# Patient Record
Sex: Female | Born: 1940
Health system: Southern US, Community
[De-identification: ages and names within clinical notes are randomized; demographics above are authoritative.]

## PROBLEM LIST (undated history)

## (undated) DIAGNOSIS — I82409 Acute embolism and thrombosis of unspecified deep veins of unspecified lower extremity: Secondary | ICD-10-CM

## (undated) DIAGNOSIS — E785 Hyperlipidemia, unspecified: Secondary | ICD-10-CM

## (undated) DIAGNOSIS — K219 Gastro-esophageal reflux disease without esophagitis: Secondary | ICD-10-CM

## (undated) DIAGNOSIS — J189 Pneumonia, unspecified organism: Secondary | ICD-10-CM

## (undated) DIAGNOSIS — F423 Hoarding disorder: Secondary | ICD-10-CM

## (undated) DIAGNOSIS — E119 Type 2 diabetes mellitus without complications: Secondary | ICD-10-CM

## (undated) DIAGNOSIS — M810 Age-related osteoporosis without current pathological fracture: Secondary | ICD-10-CM

## (undated) DIAGNOSIS — M109 Gout, unspecified: Secondary | ICD-10-CM

## (undated) DIAGNOSIS — I129 Hypertensive chronic kidney disease with stage 1 through stage 4 chronic kidney disease, or unspecified chronic kidney disease: Secondary | ICD-10-CM

## (undated) DIAGNOSIS — I10 Essential (primary) hypertension: Secondary | ICD-10-CM

## (undated) DIAGNOSIS — Z91199 Patient's noncompliance with other medical treatment and regimen due to unspecified reason: Secondary | ICD-10-CM

## (undated) DIAGNOSIS — E1122 Type 2 diabetes mellitus with diabetic chronic kidney disease: Secondary | ICD-10-CM

## (undated) DIAGNOSIS — S92901A Unspecified fracture of right foot, initial encounter for closed fracture: Secondary | ICD-10-CM

## (undated) DIAGNOSIS — M1990 Unspecified osteoarthritis, unspecified site: Secondary | ICD-10-CM

## (undated) DIAGNOSIS — F0390 Unspecified dementia without behavioral disturbance: Secondary | ICD-10-CM

## (undated) DIAGNOSIS — G3184 Mild cognitive impairment, so stated: Secondary | ICD-10-CM

## (undated) DIAGNOSIS — G43909 Migraine, unspecified, not intractable, without status migrainosus: Secondary | ICD-10-CM

## (undated) HISTORY — PX: TONSILLECTOMY: SUR1361

## (undated) HISTORY — PX: BILATERAL KNEE ARTHROSCOPY: SUR91

## (undated) HISTORY — PX: CHOLECYSTECTOMY: SHX55

## (undated) HISTORY — PX: BACK SURGERY: SHX140

## (undated) HISTORY — PX: JOINT REPLACEMENT: SHX530

## (undated) HISTORY — PX: NOSE SURGERY: SHX723

## (undated) HISTORY — PX: OTHER SURGICAL HISTORY: SHX169

---

## 2008-04-27 ENCOUNTER — Ambulatory Visit: Payer: Self-pay | Admitting: Podiatry

## 2008-06-17 ENCOUNTER — Ambulatory Visit: Payer: Self-pay | Admitting: Podiatry

## 2009-05-04 ENCOUNTER — Ambulatory Visit: Payer: Self-pay | Admitting: Podiatry

## 2009-10-04 ENCOUNTER — Ambulatory Visit: Payer: Self-pay | Admitting: Surgery

## 2009-12-22 ENCOUNTER — Ambulatory Visit: Payer: Self-pay | Admitting: Podiatry

## 2010-02-06 ENCOUNTER — Other Ambulatory Visit: Payer: Self-pay | Admitting: Sports Medicine

## 2010-04-25 ENCOUNTER — Other Ambulatory Visit: Payer: Self-pay | Admitting: Rheumatology

## 2010-06-18 ENCOUNTER — Ambulatory Visit: Payer: Self-pay | Admitting: Anesthesiology

## 2010-06-19 ENCOUNTER — Ambulatory Visit: Payer: Self-pay | Admitting: Orthopedic Surgery

## 2011-01-30 ENCOUNTER — Ambulatory Visit: Payer: Self-pay | Admitting: Orthopedic Surgery

## 2011-03-11 ENCOUNTER — Ambulatory Visit: Payer: Self-pay | Admitting: Internal Medicine

## 2011-03-12 ENCOUNTER — Ambulatory Visit: Payer: Self-pay | Admitting: Orthopedic Surgery

## 2011-04-04 ENCOUNTER — Ambulatory Visit: Payer: Self-pay | Admitting: Orthopedic Surgery

## 2011-05-09 ENCOUNTER — Encounter: Payer: Self-pay | Admitting: Orthopedic Surgery

## 2011-05-28 ENCOUNTER — Encounter: Payer: Self-pay | Admitting: Orthopedic Surgery

## 2011-06-28 ENCOUNTER — Encounter: Payer: Self-pay | Admitting: Orthopedic Surgery

## 2011-07-04 ENCOUNTER — Ambulatory Visit: Payer: Self-pay | Admitting: Orthopedic Surgery

## 2011-07-26 ENCOUNTER — Encounter: Payer: Self-pay | Admitting: Orthopedic Surgery

## 2011-08-26 ENCOUNTER — Encounter: Payer: Self-pay | Admitting: Orthopedic Surgery

## 2011-09-25 ENCOUNTER — Encounter: Payer: Self-pay | Admitting: Orthopedic Surgery

## 2011-11-08 ENCOUNTER — Ambulatory Visit: Payer: Self-pay | Admitting: Orthopedic Surgery

## 2011-12-02 ENCOUNTER — Ambulatory Visit: Payer: Self-pay | Admitting: Orthopedic Surgery

## 2011-12-02 LAB — CBC WITH DIFFERENTIAL/PLATELET
Basophil #: 0.1 10*3/uL (ref 0.0–0.1)
Eosinophil #: 0.3 10*3/uL (ref 0.0–0.7)
Eosinophil %: 3.4 %
HCT: 39.2 % (ref 35.0–47.0)
HGB: 13.3 g/dL (ref 12.0–16.0)
Lymphocyte #: 2.7 10*3/uL (ref 1.0–3.6)
Lymphocyte %: 28.3 %
MCV: 87 fL (ref 80–100)
Monocyte %: 6.4 %
Neutrophil #: 5.7 10*3/uL (ref 1.4–6.5)
RBC: 4.51 10*6/uL (ref 3.80–5.20)
RDW: 14.7 % — ABNORMAL HIGH (ref 11.5–14.5)
WBC: 9.4 10*3/uL (ref 3.6–11.0)

## 2011-12-02 LAB — PROTIME-INR: Prothrombin Time: 11.8 secs (ref 11.5–14.7)

## 2011-12-02 LAB — BASIC METABOLIC PANEL
Anion Gap: 7 (ref 7–16)
BUN: 30 mg/dL — ABNORMAL HIGH (ref 7–18)
Calcium, Total: 9.1 mg/dL (ref 8.5–10.1)
Co2: 31 mmol/L (ref 21–32)
Creatinine: 1.59 mg/dL — ABNORMAL HIGH (ref 0.60–1.30)
EGFR (African American): 38 — ABNORMAL LOW
EGFR (Non-African Amer.): 33 — ABNORMAL LOW

## 2011-12-02 LAB — MRSA PCR SCREENING

## 2011-12-17 ENCOUNTER — Inpatient Hospital Stay: Payer: Self-pay | Admitting: Orthopedic Surgery

## 2011-12-17 LAB — CREATININE, SERUM
EGFR (African American): 31 — ABNORMAL LOW
EGFR (Non-African Amer.): 27 — ABNORMAL LOW

## 2011-12-18 LAB — BASIC METABOLIC PANEL
Anion Gap: 10 (ref 7–16)
Chloride: 101 mmol/L (ref 98–107)
Co2: 30 mmol/L (ref 21–32)
Creatinine: 1.3 mg/dL (ref 0.60–1.30)
Osmolality: 285 (ref 275–301)

## 2011-12-18 LAB — PLATELET COUNT: Platelet: 198 10*3/uL (ref 150–440)

## 2011-12-18 LAB — HEMOGLOBIN: HGB: 10.6 g/dL — ABNORMAL LOW (ref 12.0–16.0)

## 2011-12-19 LAB — PATHOLOGY REPORT

## 2011-12-19 LAB — HEMOGLOBIN: HGB: 11.4 g/dL — ABNORMAL LOW (ref 12.0–16.0)

## 2011-12-20 ENCOUNTER — Encounter: Payer: Self-pay | Admitting: Internal Medicine

## 2011-12-26 ENCOUNTER — Encounter: Payer: Self-pay | Admitting: Internal Medicine

## 2012-01-26 ENCOUNTER — Encounter: Payer: Self-pay | Admitting: Internal Medicine

## 2013-09-08 IMAGING — CR DG KNEE 1-2V*R*
1 series · 2 of 2 positions shown · non-contrast
Comparison: none

REASON FOR EXAM: right total knee
COMMENTS:   LMP: N/A

PROCEDURE:     DXR - DXR KNEE RIGHT AP AND LATERAL  - December 17, 2011 [DATE]
RESULT:     The patient is status post right knee arthroplasty. Surgical
drains and skin staples are present. No immediate postoperative bone or
hardware complication is evident.

[Series 1: lat · 0.17mm/px · 2 of 2 slices shown]
[im 1/2]
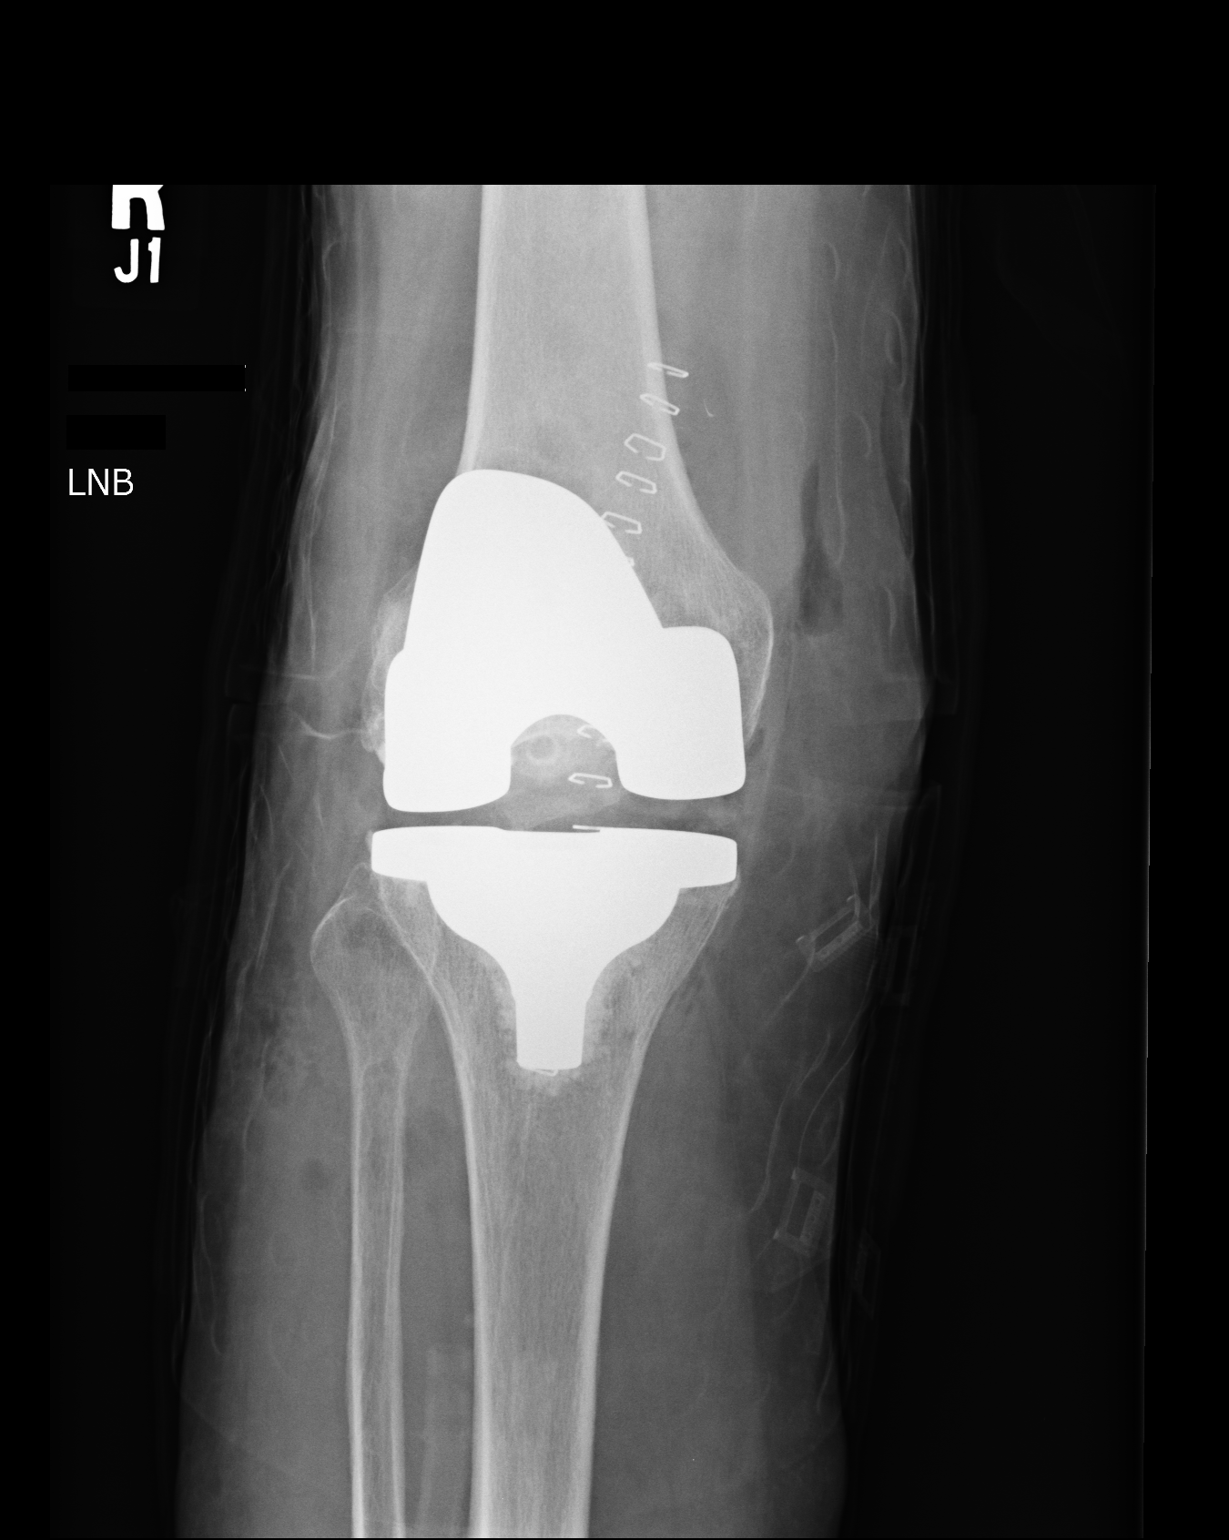
[im 2/2]
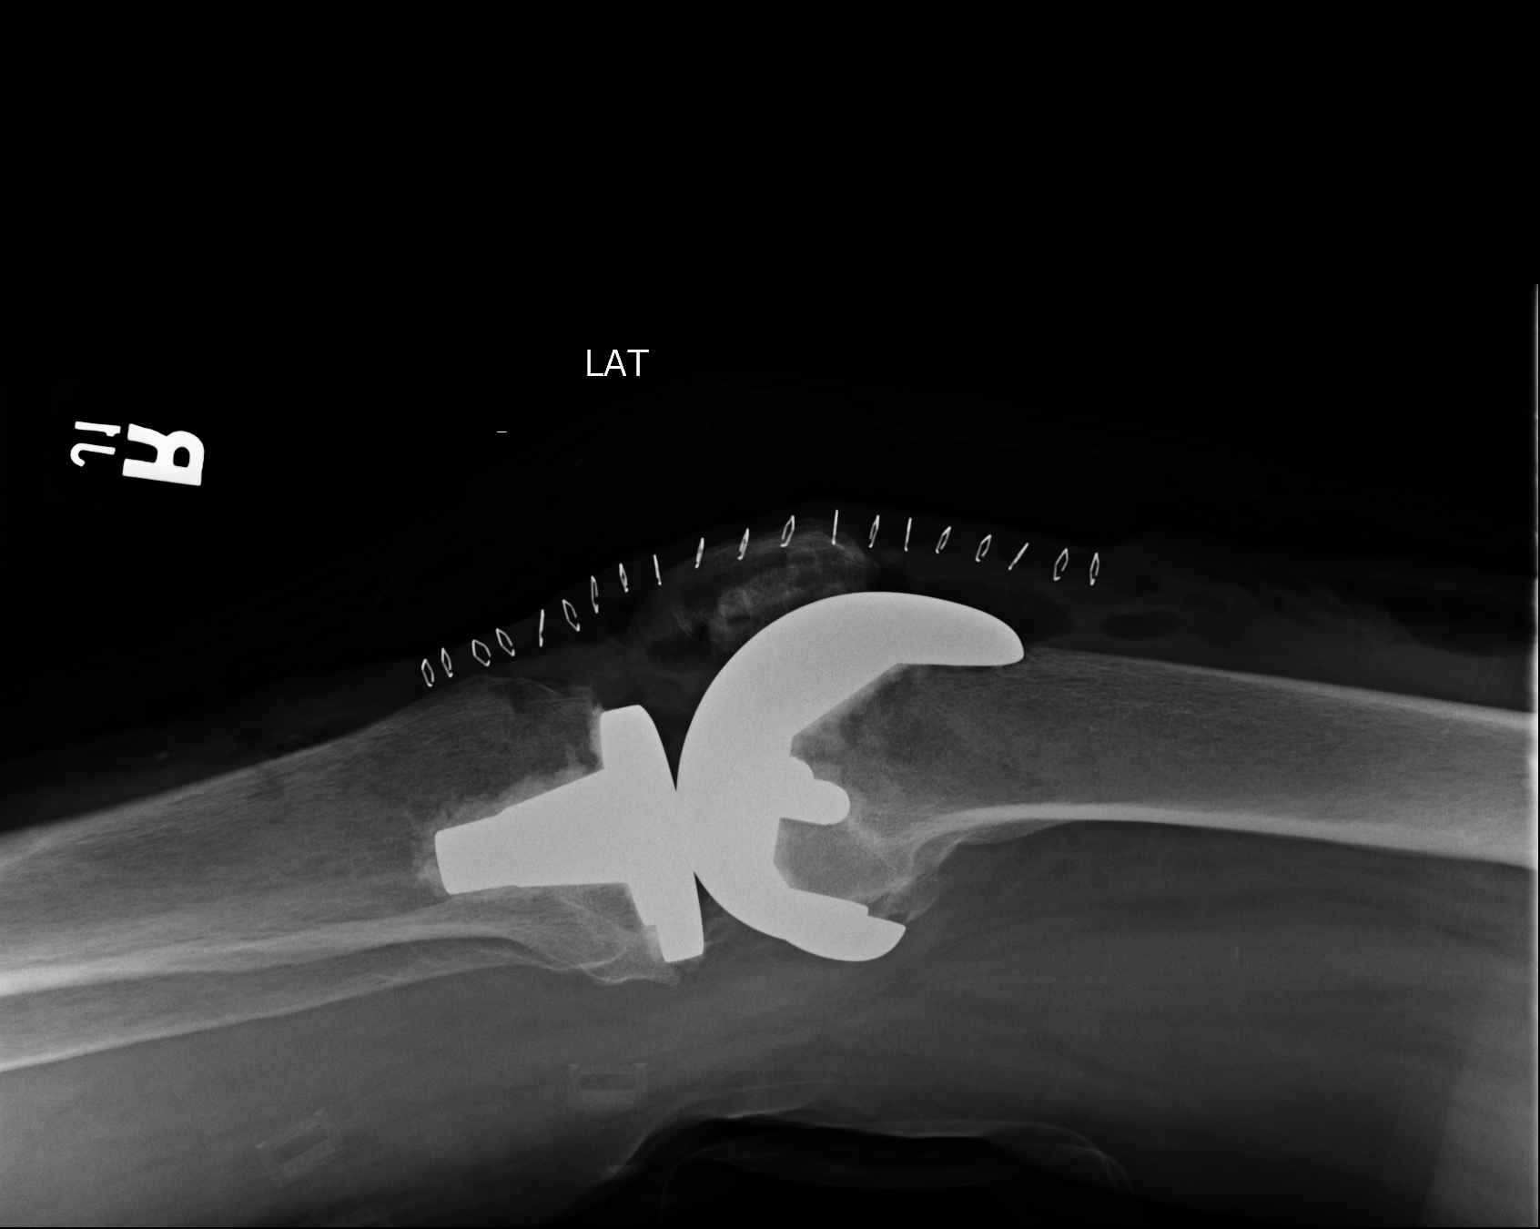

[2 of 2 positions shown; findings below may reference images not displayed]

IMPRESSION: Please see above.

[REDACTED]

## 2013-12-25 DIAGNOSIS — M810 Age-related osteoporosis without current pathological fracture: Secondary | ICD-10-CM | POA: Insufficient documentation

## 2013-12-25 DIAGNOSIS — M109 Gout, unspecified: Secondary | ICD-10-CM | POA: Insufficient documentation

## 2013-12-25 DIAGNOSIS — E1169 Type 2 diabetes mellitus with other specified complication: Secondary | ICD-10-CM | POA: Insufficient documentation

## 2013-12-25 DIAGNOSIS — E1122 Type 2 diabetes mellitus with diabetic chronic kidney disease: Secondary | ICD-10-CM | POA: Insufficient documentation

## 2014-09-13 NOTE — Op Note (Signed)
PATIENT NAME:  Joy Tapia, Joy Tapia MR#:  [id number] DATE OF BIRTH:  [birth year]  DATE OF PROCEDURE:  12/17/2011  PREOPERATIVE DIAGNOSIS: Right knee osteoarthritis.   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   PROCEDURE: Right total knee replacement.   SURGEON: Laurene Footman, MD  ASSISTANT: Rachelle Hora, PA-C  ANESTHESIA: Spinal.   DESCRIPTION OF PROCEDURE: The patient was brought to the operating room and, after adequate spinal anesthesia was obtained, a tourniquet was applied to the upper thigh, after placement of a Foley catheter, after prepping and draping in the usual sterile fashion, an Alvarado legholder also being applied, and appropriate patient identification and time out procedures were completed. The leg was exsanguinated with an Esmarch and tourniquet inflated to 300 mmHg. A midline skin incision was made followed by a medial parapatellar arthrotomy. Inspection revealed discoloraton  to the bone surfaces with almost no cartilage remaining in all three compartments. There was also golden brown hue to the synovium. A portion of this was removed and sent as a separate specimen for presumed pigmented villonodular synovitis. The anterior cruciate ligament was excised along with the fat pad and the proximal tibia exposed to allow placement of a Medacta cutting block. This was applied and checked. The proximal tibial cut was carried out. Following this the femoral cutting guide was applied, drill holes made, pins placed, and the distal cuts made. A size 3 appeared to fit appropriately and the anterior, posterior, and chamfer cuts were carried out. The residual posterior horns of the menisci were excised at this time with a little bit of residual bone from the proximal tibia cut. A size 2 trial on the tibia was placed with one of the pins from the initial block left in place for rotational evaluation followed by the #3 femur and a 10 mm  tibial insert. Full extension was obtained and there  appeared to be good stability throughout a range of motion. The notch cut was then made for the trochlear groove along with distal drill holes for the distal femur. These components were left in place and the patella was addressed with a cutting guide and this sized to a size 1 and with range of motion with no touch technique the patella tracked very well. All trial components were removed at this time. 10 mg of morphine and 30 mL of 0.25% Sensorcaine with epinephrine and a total volume of 60 mL was infiltrated in the posterior capsule along the arthrotomy to aid in postoperative analgesia. Toradol was given because of the patient's elevated creatinine. At this point, the bony surfaces were thoroughly irrigated and dried. The tibial component was cemented into place first followed by placing the polyethylene component after excess cement was removed. The femoral component was then placed and the knee held in extension with excess cement again removed. The patellar button was clamped into place again using cement technique. After the cement had set, excess cement was checked and there did not appear to be a problem with excess cement. The wound was again irrigated with pulsatile lavage and the arthrotomy repaired using a heavy quill suture followed by 2-0 quill subcutaneously and skin staples. Xeroform, 4 x 4's, ABD, Webril, Polar Care, and Ace wrap were applied. The patient was sent to the recovery room in stable condition.   ESTIMATED BLOOD LOSS: 25 mL.   COMPLICATIONS: None.   SPECIMEN: Synovium and cut ends of bone.   TOURNIQUET TIME: 75 minutes at 300 mmHg. ____________________________ Christian Mate.  Rudene Christians, MD mjm:slb D: 12/17/2011 21:13:29 ET T: 12/18/2011 09:38:10 ET JOB#: [id number]  cc: Laurene Footman, MD, <Dictator> Laurene Footman MD ELECTRONICALLY SIGNED 12/18/2011 12:50

## 2014-09-13 NOTE — Discharge Summary (Signed)
PATIENT NAME:  Joy Tapia, Joy Tapia MR#:  [id number] DATE OF BIRTH:  10-Dec-1940  DATE OF ADMISSION:  12/17/2011 DATE OF DISCHARGE:  12/20/2011  ADMITTING DIAGNOSIS: Right knee osteoarthritis.   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   PROCEDURE: Right total knee replacement.   SURGEON: Laurene Footman, M.D.   ASSISTANT: Rachelle Hora, PA-C  ANESTHESIA: Spinal.   ESTIMATED BLOOD LOSS: 25 mL.  COMPLICATIONS: None.   SPECIMENS: Synovium and cut ends of bone.  TOURNIQUET TIME: 75 minutes at 300 mmHg.   HISTORY: The patient is 74 year old with significant tricompartmental osteoarthritis of the right knee. She has debilitating pain. She has pain at rest and pain with activity and difficulty doing normal activities. She has previously had knee arthroscopy. She has had corticosteroid injections and Synvisc without relief. X-rays have shown progressive arthritis as well as confirmation with knee CT.   PHYSICAL EXAMINATION: LUNGS: Clear to auscultation. HEART: Regular rate and rhythm. HEENT: Remarkable for removal of partial denture upper. Range of motion of the right knee is 10 to 110 degrees of varus deformity that is passively correctable.   HOSPITAL COURSE: The patient was admitted to the hospital on 12/17/2011. She had surgery that same day and was brought to the orthopedic floor from the PAC-U in stable condition. The patient's vital signs were monitored as well as hemoglobin throughout her stay. On postoperative day one hemoglobin was 10.6 and then on postoperative day two it had elevated to 11.4. The patient's vital signs remained stable. Throughout the patient's stay, she received physical therapy, she progressed well, and on 12/20/2011 the patient was stable and ready for discharge to a rehab facility.   CONDITION AT DISCHARGE: Stable.   DISCHARGE INSTRUCTIONS: Activity is weight-bearing as tolerated, out of bed 2 times a day.  Neuro checks on the operative leg every four hours. TED hose thigh highs bilaterally apply to operative leg after initial dressing change. Incentive spirometry every one hour. She is to cough and deep breath every two hours. She needs to continue the Polar Care unit maintaining temperature at 40 to 50 degrees. She will continue with physical therapy for difficulty walking. She is weight-bearing as tolerated. PT to continue to evaluate and treat for muscle weakness and help with activities of daily living as well as assistive devices. She can resume a regular diet. She needs to follow up with [facility] in two weeks for staple removal.   DISCHARGE MEDICATIONS:  1. Zolpidem tablet 5 mg oral at bedtime.  2. Bisacodyl 10 mg oral or rectally daily p.r.n. constipation. 3. Milk of Magnesia 30 mL oral twice a day p.r.n. constipation. 4. Enema soapsuds p.r.n.  5. Aluminum mag hydroxide with simethicone 400/400/40 mg/5 mL solution, 30 mL oral every 6 hours p.r.n. for indigestion and heartburn. 6. Senokot 1 tablet oral twice a day. 7. Tylenol 500 to 1000 mg oral every four hours p.r.n. pain or temp greater than 100.4. 8. Oxycodone 5 to 10 mg oral every 4 hours p.r.n. for pain.  9. Albuterol oral inhaler 2 puffs inhalation every 4 hours while awake with spacer. 10. Furosemide 40 mg oral twice a day. 11. Flonase nasal spray two sprays to both nostrils daily. 12. Colchicine tablet 0.6 mg oral daily. 13. Zyrtec 10 mg oral at bedtime. 14. Aspirin 81 mg oral daily. 15. Uloric 40 mg oral daily at bedtime. 16. Pseudoephedrine 30 mL oral one a day.  17. Amlodipine 2.5 mg oral daily. 18. Benazepril 10 mg  oral daily.  19. Metoprolol 50 mg oral at bedtime. 20. Zegerid 20 mg oral at bedtime. 21. Xarelto 10 mg orally 1 tab once in the morning x9 days.  ____________________________ Duanne Guess, PA-C tcg:slb D: 12/20/2011 08:21:00 ET T: 12/20/2011 12:08:18 ET JOB#: [id number]  cc: Duanne Guess, PA-C, <Dictator> Duanne Guess PA ELECTRONICALLY SIGNED 01/01/2012 7:51

## 2014-09-13 NOTE — Op Note (Signed)
PATIENT NAME:  Joy Tapia, Joy Tapia MR#:  [id number] DATE OF BIRTH:  15-Mar-1941  DATE OF PROCEDURE:  12/17/2011  PROCEDURE: Right femoral nerve block.   ATTENDING: Victor Granados P. Phineas Douglas, MD   INDICATION: To help this patient with postoperative pain about to have a right total knee arthroplasty by Dr. Rudene Christians.   DESCRIPTION OF PROCEDURE: The risks and benefits of spinal anesthesia, general anesthesia, and femoral nerve block were discussed with the patient preoperatively in Same Day Surgery and accepted. Consent was obtained. The patient was brought to the PAC-U preoperatively. The usual monitors were applied and she was placed on nasal cannula oxygen. A time-out was done. The patient was lightly sedated with a total of 2 mg of Versed intravenously. She was still awake and in good verbal communication with [place] but much more comfortable. She had a Betadine prep of her right upper thigh and inguinal crease x3 and a sterile technique was used. A 22-gauge 2-inch Stimuplex needle with 0.7 mA of output was used. The usual landmarks were observed. The needle was advanced about 1 cm lateral to the right femoral artery pulsation. There was good right thigh movement on the first pass. There was no heme or paresthesias. She had a total of 30 mL of 0.25% bupivacaine with 1:400,000 of epinephrine injected incrementally. She tolerated the block without problem or complication. I am hopeful that the block will help her significantly with her postoperative pain for the duration of the block.     ____________________________ Werner Lean. Phineas Douglas, MD spp:drc D: 12/17/2011 07:26:20 ET T: 12/17/2011 08:17:10 ET JOB#: [id number]  cc: Nicki Reaper P. Phineas Douglas, MD, <Dictator> Lucilla Edin MD ELECTRONICALLY SIGNED 12/17/2011 9:13

## 2014-09-15 DIAGNOSIS — Z0000 Encounter for general adult medical examination without abnormal findings: Secondary | ICD-10-CM | POA: Insufficient documentation

## 2015-09-15 DIAGNOSIS — E785 Hyperlipidemia, unspecified: Secondary | ICD-10-CM | POA: Diagnosis not present

## 2015-09-15 DIAGNOSIS — I129 Hypertensive chronic kidney disease with stage 1 through stage 4 chronic kidney disease, or unspecified chronic kidney disease: Secondary | ICD-10-CM | POA: Diagnosis not present

## 2015-09-15 DIAGNOSIS — E1169 Type 2 diabetes mellitus with other specified complication: Secondary | ICD-10-CM | POA: Diagnosis not present

## 2015-09-15 DIAGNOSIS — E1122 Type 2 diabetes mellitus with diabetic chronic kidney disease: Secondary | ICD-10-CM | POA: Diagnosis not present

## 2015-09-15 DIAGNOSIS — N184 Chronic kidney disease, stage 4 (severe): Secondary | ICD-10-CM | POA: Diagnosis not present

## 2015-09-21 DIAGNOSIS — E785 Hyperlipidemia, unspecified: Secondary | ICD-10-CM | POA: Diagnosis not present

## 2015-09-21 DIAGNOSIS — I129 Hypertensive chronic kidney disease with stage 1 through stage 4 chronic kidney disease, or unspecified chronic kidney disease: Secondary | ICD-10-CM | POA: Diagnosis not present

## 2015-09-21 DIAGNOSIS — E1169 Type 2 diabetes mellitus with other specified complication: Secondary | ICD-10-CM | POA: Diagnosis not present

## 2015-09-21 DIAGNOSIS — M1A9XX0 Chronic gout, unspecified, without tophus (tophi): Secondary | ICD-10-CM | POA: Diagnosis not present

## 2015-09-21 DIAGNOSIS — N184 Chronic kidney disease, stage 4 (severe): Secondary | ICD-10-CM | POA: Diagnosis not present

## 2015-09-21 DIAGNOSIS — E1122 Type 2 diabetes mellitus with diabetic chronic kidney disease: Secondary | ICD-10-CM | POA: Diagnosis not present

## 2016-01-16 DIAGNOSIS — I129 Hypertensive chronic kidney disease with stage 1 through stage 4 chronic kidney disease, or unspecified chronic kidney disease: Secondary | ICD-10-CM | POA: Diagnosis not present

## 2016-01-16 DIAGNOSIS — N184 Chronic kidney disease, stage 4 (severe): Secondary | ICD-10-CM | POA: Diagnosis not present

## 2016-01-16 DIAGNOSIS — E1169 Type 2 diabetes mellitus with other specified complication: Secondary | ICD-10-CM | POA: Diagnosis not present

## 2016-01-16 DIAGNOSIS — E785 Hyperlipidemia, unspecified: Secondary | ICD-10-CM | POA: Diagnosis not present

## 2016-01-16 DIAGNOSIS — E1122 Type 2 diabetes mellitus with diabetic chronic kidney disease: Secondary | ICD-10-CM | POA: Diagnosis not present

## 2016-01-16 DIAGNOSIS — M1A9XX0 Chronic gout, unspecified, without tophus (tophi): Secondary | ICD-10-CM | POA: Diagnosis not present

## 2016-01-23 DIAGNOSIS — M1A9XX0 Chronic gout, unspecified, without tophus (tophi): Secondary | ICD-10-CM | POA: Diagnosis not present

## 2016-01-23 DIAGNOSIS — E1122 Type 2 diabetes mellitus with diabetic chronic kidney disease: Secondary | ICD-10-CM | POA: Diagnosis not present

## 2016-01-23 DIAGNOSIS — E785 Hyperlipidemia, unspecified: Secondary | ICD-10-CM | POA: Diagnosis not present

## 2016-01-23 DIAGNOSIS — E1169 Type 2 diabetes mellitus with other specified complication: Secondary | ICD-10-CM | POA: Diagnosis not present

## 2016-01-23 DIAGNOSIS — N184 Chronic kidney disease, stage 4 (severe): Secondary | ICD-10-CM | POA: Diagnosis not present

## 2016-01-23 DIAGNOSIS — I129 Hypertensive chronic kidney disease with stage 1 through stage 4 chronic kidney disease, or unspecified chronic kidney disease: Secondary | ICD-10-CM | POA: Diagnosis not present

## 2016-01-23 DIAGNOSIS — Z0000 Encounter for general adult medical examination without abnormal findings: Secondary | ICD-10-CM | POA: Diagnosis not present

## 2016-05-17 DIAGNOSIS — E785 Hyperlipidemia, unspecified: Secondary | ICD-10-CM | POA: Diagnosis not present

## 2016-05-17 DIAGNOSIS — E1122 Type 2 diabetes mellitus with diabetic chronic kidney disease: Secondary | ICD-10-CM | POA: Diagnosis not present

## 2016-05-17 DIAGNOSIS — E1169 Type 2 diabetes mellitus with other specified complication: Secondary | ICD-10-CM | POA: Diagnosis not present

## 2016-05-17 DIAGNOSIS — N184 Chronic kidney disease, stage 4 (severe): Secondary | ICD-10-CM | POA: Diagnosis not present

## 2016-05-17 DIAGNOSIS — I129 Hypertensive chronic kidney disease with stage 1 through stage 4 chronic kidney disease, or unspecified chronic kidney disease: Secondary | ICD-10-CM | POA: Diagnosis not present

## 2016-05-24 DIAGNOSIS — M810 Age-related osteoporosis without current pathological fracture: Secondary | ICD-10-CM | POA: Diagnosis not present

## 2016-05-24 DIAGNOSIS — M1A9XX0 Chronic gout, unspecified, without tophus (tophi): Secondary | ICD-10-CM | POA: Diagnosis not present

## 2016-05-24 DIAGNOSIS — E1169 Type 2 diabetes mellitus with other specified complication: Secondary | ICD-10-CM | POA: Diagnosis not present

## 2016-05-24 DIAGNOSIS — Z0000 Encounter for general adult medical examination without abnormal findings: Secondary | ICD-10-CM | POA: Diagnosis not present

## 2016-05-24 DIAGNOSIS — E785 Hyperlipidemia, unspecified: Secondary | ICD-10-CM | POA: Diagnosis not present

## 2016-05-24 DIAGNOSIS — N184 Chronic kidney disease, stage 4 (severe): Secondary | ICD-10-CM | POA: Diagnosis not present

## 2016-05-24 DIAGNOSIS — E1122 Type 2 diabetes mellitus with diabetic chronic kidney disease: Secondary | ICD-10-CM | POA: Diagnosis not present

## 2016-05-24 DIAGNOSIS — I129 Hypertensive chronic kidney disease with stage 1 through stage 4 chronic kidney disease, or unspecified chronic kidney disease: Secondary | ICD-10-CM | POA: Diagnosis not present

## 2016-09-16 DIAGNOSIS — E1122 Type 2 diabetes mellitus with diabetic chronic kidney disease: Secondary | ICD-10-CM | POA: Diagnosis not present

## 2016-09-16 DIAGNOSIS — M1A9XX0 Chronic gout, unspecified, without tophus (tophi): Secondary | ICD-10-CM | POA: Diagnosis not present

## 2016-09-16 DIAGNOSIS — I129 Hypertensive chronic kidney disease with stage 1 through stage 4 chronic kidney disease, or unspecified chronic kidney disease: Secondary | ICD-10-CM | POA: Diagnosis not present

## 2016-09-16 DIAGNOSIS — N184 Chronic kidney disease, stage 4 (severe): Secondary | ICD-10-CM | POA: Diagnosis not present

## 2016-09-16 DIAGNOSIS — E1169 Type 2 diabetes mellitus with other specified complication: Secondary | ICD-10-CM | POA: Diagnosis not present

## 2016-09-16 DIAGNOSIS — E785 Hyperlipidemia, unspecified: Secondary | ICD-10-CM | POA: Diagnosis not present

## 2016-09-23 DIAGNOSIS — E785 Hyperlipidemia, unspecified: Secondary | ICD-10-CM | POA: Diagnosis not present

## 2016-09-23 DIAGNOSIS — E1122 Type 2 diabetes mellitus with diabetic chronic kidney disease: Secondary | ICD-10-CM | POA: Diagnosis not present

## 2016-09-23 DIAGNOSIS — I129 Hypertensive chronic kidney disease with stage 1 through stage 4 chronic kidney disease, or unspecified chronic kidney disease: Secondary | ICD-10-CM | POA: Diagnosis not present

## 2016-09-23 DIAGNOSIS — N184 Chronic kidney disease, stage 4 (severe): Secondary | ICD-10-CM | POA: Diagnosis not present

## 2016-09-23 DIAGNOSIS — M1A9XX0 Chronic gout, unspecified, without tophus (tophi): Secondary | ICD-10-CM | POA: Diagnosis not present

## 2016-09-23 DIAGNOSIS — E1169 Type 2 diabetes mellitus with other specified complication: Secondary | ICD-10-CM | POA: Diagnosis not present

## 2017-01-22 DIAGNOSIS — E1122 Type 2 diabetes mellitus with diabetic chronic kidney disease: Secondary | ICD-10-CM | POA: Diagnosis not present

## 2017-01-22 DIAGNOSIS — I129 Hypertensive chronic kidney disease with stage 1 through stage 4 chronic kidney disease, or unspecified chronic kidney disease: Secondary | ICD-10-CM | POA: Diagnosis not present

## 2017-01-22 DIAGNOSIS — E1169 Type 2 diabetes mellitus with other specified complication: Secondary | ICD-10-CM | POA: Diagnosis not present

## 2017-01-22 DIAGNOSIS — N184 Chronic kidney disease, stage 4 (severe): Secondary | ICD-10-CM | POA: Diagnosis not present

## 2017-01-22 DIAGNOSIS — E785 Hyperlipidemia, unspecified: Secondary | ICD-10-CM | POA: Diagnosis not present

## 2017-01-29 DIAGNOSIS — E1169 Type 2 diabetes mellitus with other specified complication: Secondary | ICD-10-CM | POA: Diagnosis not present

## 2017-01-29 DIAGNOSIS — I129 Hypertensive chronic kidney disease with stage 1 through stage 4 chronic kidney disease, or unspecified chronic kidney disease: Secondary | ICD-10-CM | POA: Diagnosis not present

## 2017-01-29 DIAGNOSIS — N184 Chronic kidney disease, stage 4 (severe): Secondary | ICD-10-CM | POA: Diagnosis not present

## 2017-01-29 DIAGNOSIS — M1A9XX0 Chronic gout, unspecified, without tophus (tophi): Secondary | ICD-10-CM | POA: Diagnosis not present

## 2017-01-29 DIAGNOSIS — E785 Hyperlipidemia, unspecified: Secondary | ICD-10-CM | POA: Diagnosis not present

## 2017-01-29 DIAGNOSIS — E1122 Type 2 diabetes mellitus with diabetic chronic kidney disease: Secondary | ICD-10-CM | POA: Diagnosis not present

## 2017-01-29 DIAGNOSIS — Z0000 Encounter for general adult medical examination without abnormal findings: Secondary | ICD-10-CM | POA: Diagnosis not present

## 2017-05-30 DIAGNOSIS — E1169 Type 2 diabetes mellitus with other specified complication: Secondary | ICD-10-CM | POA: Diagnosis not present

## 2017-05-30 DIAGNOSIS — N184 Chronic kidney disease, stage 4 (severe): Secondary | ICD-10-CM | POA: Diagnosis not present

## 2017-05-30 DIAGNOSIS — I129 Hypertensive chronic kidney disease with stage 1 through stage 4 chronic kidney disease, or unspecified chronic kidney disease: Secondary | ICD-10-CM | POA: Diagnosis not present

## 2017-05-30 DIAGNOSIS — E1122 Type 2 diabetes mellitus with diabetic chronic kidney disease: Secondary | ICD-10-CM | POA: Diagnosis not present

## 2017-05-30 DIAGNOSIS — E785 Hyperlipidemia, unspecified: Secondary | ICD-10-CM | POA: Diagnosis not present

## 2017-06-06 DIAGNOSIS — E1169 Type 2 diabetes mellitus with other specified complication: Secondary | ICD-10-CM | POA: Diagnosis not present

## 2017-06-06 DIAGNOSIS — I129 Hypertensive chronic kidney disease with stage 1 through stage 4 chronic kidney disease, or unspecified chronic kidney disease: Secondary | ICD-10-CM | POA: Diagnosis not present

## 2017-06-06 DIAGNOSIS — E1122 Type 2 diabetes mellitus with diabetic chronic kidney disease: Secondary | ICD-10-CM | POA: Diagnosis not present

## 2017-06-06 DIAGNOSIS — E785 Hyperlipidemia, unspecified: Secondary | ICD-10-CM | POA: Diagnosis not present

## 2017-06-06 DIAGNOSIS — N184 Chronic kidney disease, stage 4 (severe): Secondary | ICD-10-CM | POA: Diagnosis not present

## 2017-09-26 DIAGNOSIS — I129 Hypertensive chronic kidney disease with stage 1 through stage 4 chronic kidney disease, or unspecified chronic kidney disease: Secondary | ICD-10-CM | POA: Diagnosis not present

## 2017-09-26 DIAGNOSIS — E1169 Type 2 diabetes mellitus with other specified complication: Secondary | ICD-10-CM | POA: Diagnosis not present

## 2017-09-26 DIAGNOSIS — E785 Hyperlipidemia, unspecified: Secondary | ICD-10-CM | POA: Diagnosis not present

## 2017-09-26 DIAGNOSIS — E1122 Type 2 diabetes mellitus with diabetic chronic kidney disease: Secondary | ICD-10-CM | POA: Diagnosis not present

## 2017-09-26 DIAGNOSIS — N184 Chronic kidney disease, stage 4 (severe): Secondary | ICD-10-CM | POA: Diagnosis not present

## 2017-10-03 DIAGNOSIS — M1990 Unspecified osteoarthritis, unspecified site: Secondary | ICD-10-CM | POA: Diagnosis not present

## 2017-10-03 DIAGNOSIS — E1169 Type 2 diabetes mellitus with other specified complication: Secondary | ICD-10-CM | POA: Diagnosis not present

## 2017-10-03 DIAGNOSIS — M810 Age-related osteoporosis without current pathological fracture: Secondary | ICD-10-CM | POA: Diagnosis not present

## 2017-10-03 DIAGNOSIS — E1122 Type 2 diabetes mellitus with diabetic chronic kidney disease: Secondary | ICD-10-CM | POA: Diagnosis not present

## 2017-10-03 DIAGNOSIS — I129 Hypertensive chronic kidney disease with stage 1 through stage 4 chronic kidney disease, or unspecified chronic kidney disease: Secondary | ICD-10-CM | POA: Diagnosis not present

## 2017-10-03 DIAGNOSIS — N184 Chronic kidney disease, stage 4 (severe): Secondary | ICD-10-CM | POA: Diagnosis not present

## 2017-10-03 DIAGNOSIS — E785 Hyperlipidemia, unspecified: Secondary | ICD-10-CM | POA: Diagnosis not present

## 2017-12-09 DIAGNOSIS — M12811 Other specific arthropathies, not elsewhere classified, right shoulder: Secondary | ICD-10-CM | POA: Diagnosis not present

## 2017-12-09 DIAGNOSIS — M19011 Primary osteoarthritis, right shoulder: Secondary | ICD-10-CM | POA: Diagnosis not present

## 2017-12-29 DIAGNOSIS — H00015 Hordeolum externum left lower eyelid: Secondary | ICD-10-CM | POA: Diagnosis not present

## 2018-01-23 DIAGNOSIS — N184 Chronic kidney disease, stage 4 (severe): Secondary | ICD-10-CM | POA: Diagnosis not present

## 2018-01-23 DIAGNOSIS — E785 Hyperlipidemia, unspecified: Secondary | ICD-10-CM | POA: Diagnosis not present

## 2018-01-23 DIAGNOSIS — E1122 Type 2 diabetes mellitus with diabetic chronic kidney disease: Secondary | ICD-10-CM | POA: Diagnosis not present

## 2018-01-23 DIAGNOSIS — E1169 Type 2 diabetes mellitus with other specified complication: Secondary | ICD-10-CM | POA: Diagnosis not present

## 2018-01-23 DIAGNOSIS — I129 Hypertensive chronic kidney disease with stage 1 through stage 4 chronic kidney disease, or unspecified chronic kidney disease: Secondary | ICD-10-CM | POA: Diagnosis not present

## 2018-01-30 DIAGNOSIS — E1169 Type 2 diabetes mellitus with other specified complication: Secondary | ICD-10-CM | POA: Diagnosis not present

## 2018-01-30 DIAGNOSIS — Z0000 Encounter for general adult medical examination without abnormal findings: Secondary | ICD-10-CM | POA: Diagnosis not present

## 2018-01-30 DIAGNOSIS — E785 Hyperlipidemia, unspecified: Secondary | ICD-10-CM | POA: Diagnosis not present

## 2018-01-30 DIAGNOSIS — E1122 Type 2 diabetes mellitus with diabetic chronic kidney disease: Secondary | ICD-10-CM | POA: Diagnosis not present

## 2018-01-30 DIAGNOSIS — M810 Age-related osteoporosis without current pathological fracture: Secondary | ICD-10-CM | POA: Diagnosis not present

## 2018-01-30 DIAGNOSIS — I129 Hypertensive chronic kidney disease with stage 1 through stage 4 chronic kidney disease, or unspecified chronic kidney disease: Secondary | ICD-10-CM | POA: Diagnosis not present

## 2018-01-30 DIAGNOSIS — N184 Chronic kidney disease, stage 4 (severe): Secondary | ICD-10-CM | POA: Diagnosis not present

## 2018-03-16 DIAGNOSIS — H6011 Cellulitis of right external ear: Secondary | ICD-10-CM | POA: Diagnosis not present

## 2018-04-01 DIAGNOSIS — C44222 Squamous cell carcinoma of skin of right ear and external auricular canal: Secondary | ICD-10-CM | POA: Diagnosis not present

## 2018-04-01 DIAGNOSIS — H6011 Cellulitis of right external ear: Secondary | ICD-10-CM | POA: Diagnosis not present

## 2018-04-20 DIAGNOSIS — L578 Other skin changes due to chronic exposure to nonionizing radiation: Secondary | ICD-10-CM | POA: Diagnosis not present

## 2018-04-20 DIAGNOSIS — L814 Other melanin hyperpigmentation: Secondary | ICD-10-CM | POA: Diagnosis not present

## 2018-04-20 DIAGNOSIS — L988 Other specified disorders of the skin and subcutaneous tissue: Secondary | ICD-10-CM | POA: Diagnosis not present

## 2018-04-20 DIAGNOSIS — C44222 Squamous cell carcinoma of skin of right ear and external auricular canal: Secondary | ICD-10-CM | POA: Diagnosis not present

## 2018-05-25 DIAGNOSIS — I129 Hypertensive chronic kidney disease with stage 1 through stage 4 chronic kidney disease, or unspecified chronic kidney disease: Secondary | ICD-10-CM | POA: Diagnosis not present

## 2018-05-25 DIAGNOSIS — E1122 Type 2 diabetes mellitus with diabetic chronic kidney disease: Secondary | ICD-10-CM | POA: Diagnosis not present

## 2018-05-25 DIAGNOSIS — N184 Chronic kidney disease, stage 4 (severe): Secondary | ICD-10-CM | POA: Diagnosis not present

## 2018-05-25 DIAGNOSIS — E1169 Type 2 diabetes mellitus with other specified complication: Secondary | ICD-10-CM | POA: Diagnosis not present

## 2018-05-25 DIAGNOSIS — E785 Hyperlipidemia, unspecified: Secondary | ICD-10-CM | POA: Diagnosis not present

## 2018-06-01 DIAGNOSIS — M1990 Unspecified osteoarthritis, unspecified site: Secondary | ICD-10-CM | POA: Diagnosis not present

## 2018-06-01 DIAGNOSIS — E785 Hyperlipidemia, unspecified: Secondary | ICD-10-CM | POA: Diagnosis not present

## 2018-06-01 DIAGNOSIS — N184 Chronic kidney disease, stage 4 (severe): Secondary | ICD-10-CM | POA: Diagnosis not present

## 2018-06-01 DIAGNOSIS — E1122 Type 2 diabetes mellitus with diabetic chronic kidney disease: Secondary | ICD-10-CM | POA: Diagnosis not present

## 2018-06-01 DIAGNOSIS — I129 Hypertensive chronic kidney disease with stage 1 through stage 4 chronic kidney disease, or unspecified chronic kidney disease: Secondary | ICD-10-CM | POA: Diagnosis not present

## 2018-06-01 DIAGNOSIS — E1169 Type 2 diabetes mellitus with other specified complication: Secondary | ICD-10-CM | POA: Diagnosis not present

## 2018-09-24 DIAGNOSIS — I129 Hypertensive chronic kidney disease with stage 1 through stage 4 chronic kidney disease, or unspecified chronic kidney disease: Secondary | ICD-10-CM | POA: Diagnosis not present

## 2018-09-24 DIAGNOSIS — E1169 Type 2 diabetes mellitus with other specified complication: Secondary | ICD-10-CM | POA: Diagnosis not present

## 2018-09-24 DIAGNOSIS — N184 Chronic kidney disease, stage 4 (severe): Secondary | ICD-10-CM | POA: Diagnosis not present

## 2018-09-24 DIAGNOSIS — E1122 Type 2 diabetes mellitus with diabetic chronic kidney disease: Secondary | ICD-10-CM | POA: Diagnosis not present

## 2018-09-24 DIAGNOSIS — E785 Hyperlipidemia, unspecified: Secondary | ICD-10-CM | POA: Diagnosis not present

## 2018-10-01 DIAGNOSIS — N184 Chronic kidney disease, stage 4 (severe): Secondary | ICD-10-CM | POA: Diagnosis not present

## 2018-10-01 DIAGNOSIS — I129 Hypertensive chronic kidney disease with stage 1 through stage 4 chronic kidney disease, or unspecified chronic kidney disease: Secondary | ICD-10-CM | POA: Diagnosis not present

## 2018-10-01 DIAGNOSIS — E1122 Type 2 diabetes mellitus with diabetic chronic kidney disease: Secondary | ICD-10-CM | POA: Diagnosis not present

## 2018-10-01 DIAGNOSIS — E785 Hyperlipidemia, unspecified: Secondary | ICD-10-CM | POA: Diagnosis not present

## 2018-10-01 DIAGNOSIS — M810 Age-related osteoporosis without current pathological fracture: Secondary | ICD-10-CM | POA: Diagnosis not present

## 2018-10-01 DIAGNOSIS — E1169 Type 2 diabetes mellitus with other specified complication: Secondary | ICD-10-CM | POA: Diagnosis not present

## 2018-10-01 DIAGNOSIS — M1990 Unspecified osteoarthritis, unspecified site: Secondary | ICD-10-CM | POA: Diagnosis not present

## 2018-10-06 DIAGNOSIS — M19011 Primary osteoarthritis, right shoulder: Secondary | ICD-10-CM | POA: Diagnosis not present

## 2018-10-06 DIAGNOSIS — M12811 Other specific arthropathies, not elsewhere classified, right shoulder: Secondary | ICD-10-CM | POA: Diagnosis not present

## 2019-01-28 DIAGNOSIS — M1990 Unspecified osteoarthritis, unspecified site: Secondary | ICD-10-CM | POA: Diagnosis not present

## 2019-01-28 DIAGNOSIS — N184 Chronic kidney disease, stage 4 (severe): Secondary | ICD-10-CM | POA: Diagnosis not present

## 2019-01-28 DIAGNOSIS — I129 Hypertensive chronic kidney disease with stage 1 through stage 4 chronic kidney disease, or unspecified chronic kidney disease: Secondary | ICD-10-CM | POA: Diagnosis not present

## 2019-01-28 DIAGNOSIS — E1122 Type 2 diabetes mellitus with diabetic chronic kidney disease: Secondary | ICD-10-CM | POA: Diagnosis not present

## 2019-01-28 DIAGNOSIS — E1169 Type 2 diabetes mellitus with other specified complication: Secondary | ICD-10-CM | POA: Diagnosis not present

## 2019-01-28 DIAGNOSIS — E785 Hyperlipidemia, unspecified: Secondary | ICD-10-CM | POA: Diagnosis not present

## 2019-02-04 DIAGNOSIS — N184 Chronic kidney disease, stage 4 (severe): Secondary | ICD-10-CM | POA: Diagnosis not present

## 2019-02-04 DIAGNOSIS — M810 Age-related osteoporosis without current pathological fracture: Secondary | ICD-10-CM | POA: Diagnosis not present

## 2019-02-04 DIAGNOSIS — E1122 Type 2 diabetes mellitus with diabetic chronic kidney disease: Secondary | ICD-10-CM | POA: Diagnosis not present

## 2019-02-04 DIAGNOSIS — Z0000 Encounter for general adult medical examination without abnormal findings: Secondary | ICD-10-CM | POA: Diagnosis not present

## 2019-02-04 DIAGNOSIS — E1169 Type 2 diabetes mellitus with other specified complication: Secondary | ICD-10-CM | POA: Diagnosis not present

## 2019-02-04 DIAGNOSIS — I129 Hypertensive chronic kidney disease with stage 1 through stage 4 chronic kidney disease, or unspecified chronic kidney disease: Secondary | ICD-10-CM | POA: Diagnosis not present

## 2019-02-04 DIAGNOSIS — E785 Hyperlipidemia, unspecified: Secondary | ICD-10-CM | POA: Diagnosis not present

## 2019-04-11 DIAGNOSIS — Z91199 Patient's noncompliance with other medical treatment and regimen due to unspecified reason: Secondary | ICD-10-CM | POA: Insufficient documentation

## 2019-04-27 DIAGNOSIS — R413 Other amnesia: Secondary | ICD-10-CM | POA: Diagnosis not present

## 2019-06-03 DIAGNOSIS — E785 Hyperlipidemia, unspecified: Secondary | ICD-10-CM | POA: Diagnosis not present

## 2019-06-03 DIAGNOSIS — N184 Chronic kidney disease, stage 4 (severe): Secondary | ICD-10-CM | POA: Diagnosis not present

## 2019-06-03 DIAGNOSIS — E1122 Type 2 diabetes mellitus with diabetic chronic kidney disease: Secondary | ICD-10-CM | POA: Diagnosis not present

## 2019-06-03 DIAGNOSIS — E1169 Type 2 diabetes mellitus with other specified complication: Secondary | ICD-10-CM | POA: Diagnosis not present

## 2019-06-03 DIAGNOSIS — I129 Hypertensive chronic kidney disease with stage 1 through stage 4 chronic kidney disease, or unspecified chronic kidney disease: Secondary | ICD-10-CM | POA: Diagnosis not present

## 2019-06-10 DIAGNOSIS — E1122 Type 2 diabetes mellitus with diabetic chronic kidney disease: Secondary | ICD-10-CM | POA: Diagnosis not present

## 2019-06-10 DIAGNOSIS — I129 Hypertensive chronic kidney disease with stage 1 through stage 4 chronic kidney disease, or unspecified chronic kidney disease: Secondary | ICD-10-CM | POA: Diagnosis not present

## 2019-06-10 DIAGNOSIS — E785 Hyperlipidemia, unspecified: Secondary | ICD-10-CM | POA: Diagnosis not present

## 2019-06-10 DIAGNOSIS — N184 Chronic kidney disease, stage 4 (severe): Secondary | ICD-10-CM | POA: Diagnosis not present

## 2019-06-10 DIAGNOSIS — E1169 Type 2 diabetes mellitus with other specified complication: Secondary | ICD-10-CM | POA: Diagnosis not present

## 2019-06-10 DIAGNOSIS — M1A9XX0 Chronic gout, unspecified, without tophus (tophi): Secondary | ICD-10-CM | POA: Diagnosis not present

## 2019-06-10 DIAGNOSIS — M810 Age-related osteoporosis without current pathological fracture: Secondary | ICD-10-CM | POA: Diagnosis not present

## 2019-10-07 DIAGNOSIS — N184 Chronic kidney disease, stage 4 (severe): Secondary | ICD-10-CM | POA: Diagnosis not present

## 2019-10-07 DIAGNOSIS — E1122 Type 2 diabetes mellitus with diabetic chronic kidney disease: Secondary | ICD-10-CM | POA: Diagnosis not present

## 2019-10-07 DIAGNOSIS — I129 Hypertensive chronic kidney disease with stage 1 through stage 4 chronic kidney disease, or unspecified chronic kidney disease: Secondary | ICD-10-CM | POA: Diagnosis not present

## 2019-10-07 DIAGNOSIS — E1169 Type 2 diabetes mellitus with other specified complication: Secondary | ICD-10-CM | POA: Diagnosis not present

## 2019-10-07 DIAGNOSIS — E785 Hyperlipidemia, unspecified: Secondary | ICD-10-CM | POA: Diagnosis not present

## 2019-10-14 DIAGNOSIS — Z9119 Patient's noncompliance with other medical treatment and regimen: Secondary | ICD-10-CM | POA: Diagnosis not present

## 2019-10-14 DIAGNOSIS — M1A9XX0 Chronic gout, unspecified, without tophus (tophi): Secondary | ICD-10-CM | POA: Diagnosis not present

## 2019-10-14 DIAGNOSIS — E1169 Type 2 diabetes mellitus with other specified complication: Secondary | ICD-10-CM | POA: Diagnosis not present

## 2019-10-14 DIAGNOSIS — N184 Chronic kidney disease, stage 4 (severe): Secondary | ICD-10-CM | POA: Diagnosis not present

## 2019-10-14 DIAGNOSIS — E1122 Type 2 diabetes mellitus with diabetic chronic kidney disease: Secondary | ICD-10-CM | POA: Diagnosis not present

## 2019-10-14 DIAGNOSIS — I129 Hypertensive chronic kidney disease with stage 1 through stage 4 chronic kidney disease, or unspecified chronic kidney disease: Secondary | ICD-10-CM | POA: Diagnosis not present

## 2019-10-14 DIAGNOSIS — E785 Hyperlipidemia, unspecified: Secondary | ICD-10-CM | POA: Diagnosis not present

## 2020-02-08 DIAGNOSIS — I129 Hypertensive chronic kidney disease with stage 1 through stage 4 chronic kidney disease, or unspecified chronic kidney disease: Secondary | ICD-10-CM | POA: Diagnosis not present

## 2020-02-08 DIAGNOSIS — E1169 Type 2 diabetes mellitus with other specified complication: Secondary | ICD-10-CM | POA: Diagnosis not present

## 2020-02-08 DIAGNOSIS — E785 Hyperlipidemia, unspecified: Secondary | ICD-10-CM | POA: Diagnosis not present

## 2020-02-08 DIAGNOSIS — N184 Chronic kidney disease, stage 4 (severe): Secondary | ICD-10-CM | POA: Diagnosis not present

## 2020-02-08 DIAGNOSIS — E1122 Type 2 diabetes mellitus with diabetic chronic kidney disease: Secondary | ICD-10-CM | POA: Diagnosis not present

## 2020-02-15 ENCOUNTER — Other Ambulatory Visit: Payer: Self-pay

## 2020-02-15 ENCOUNTER — Other Ambulatory Visit: Payer: Self-pay | Admitting: Internal Medicine

## 2020-02-15 ENCOUNTER — Other Ambulatory Visit
Admission: RE | Admit: 2020-02-15 | Discharge: 2020-02-15 | Disposition: A | Discharge status: Home / Self Care | Payer: PPO | Source: Ambulatory Visit | Attending: Internal Medicine | Admitting: Internal Medicine

## 2020-02-15 ENCOUNTER — Ambulatory Visit
Admission: RE | Admit: 2020-02-15 | Discharge: 2020-02-15 | Disposition: A | Discharge status: Home / Self Care | Payer: PPO | Source: Ambulatory Visit | Attending: Internal Medicine | Admitting: Internal Medicine

## 2020-02-15 DIAGNOSIS — E1122 Type 2 diabetes mellitus with diabetic chronic kidney disease: Secondary | ICD-10-CM | POA: Diagnosis not present

## 2020-02-15 DIAGNOSIS — N184 Chronic kidney disease, stage 4 (severe): Secondary | ICD-10-CM | POA: Diagnosis not present

## 2020-02-15 DIAGNOSIS — E1169 Type 2 diabetes mellitus with other specified complication: Secondary | ICD-10-CM | POA: Diagnosis not present

## 2020-02-15 DIAGNOSIS — R7989 Other specified abnormal findings of blood chemistry: Secondary | ICD-10-CM | POA: Insufficient documentation

## 2020-02-15 DIAGNOSIS — I129 Hypertensive chronic kidney disease with stage 1 through stage 4 chronic kidney disease, or unspecified chronic kidney disease: Secondary | ICD-10-CM | POA: Diagnosis not present

## 2020-02-15 DIAGNOSIS — Z0000 Encounter for general adult medical examination without abnormal findings: Secondary | ICD-10-CM | POA: Diagnosis not present

## 2020-02-15 DIAGNOSIS — R600 Localized edema: Secondary | ICD-10-CM | POA: Insufficient documentation

## 2020-02-15 DIAGNOSIS — M79661 Pain in right lower leg: Secondary | ICD-10-CM | POA: Diagnosis not present

## 2020-02-15 DIAGNOSIS — Z23 Encounter for immunization: Secondary | ICD-10-CM | POA: Diagnosis not present

## 2020-02-15 DIAGNOSIS — E785 Hyperlipidemia, unspecified: Secondary | ICD-10-CM | POA: Diagnosis not present

## 2020-02-15 LAB — FIBRIN DERIVATIVES D-DIMER (ARMC ONLY): Fibrin derivatives D-dimer (ARMC): 1017.28 ng/mL (FEU) — ABNORMAL HIGH (ref 0.00–499.00)

## 2020-06-14 DIAGNOSIS — E1122 Type 2 diabetes mellitus with diabetic chronic kidney disease: Secondary | ICD-10-CM | POA: Diagnosis not present

## 2020-06-14 DIAGNOSIS — E785 Hyperlipidemia, unspecified: Secondary | ICD-10-CM | POA: Diagnosis not present

## 2020-06-14 DIAGNOSIS — N184 Chronic kidney disease, stage 4 (severe): Secondary | ICD-10-CM | POA: Diagnosis not present

## 2020-06-14 DIAGNOSIS — E1169 Type 2 diabetes mellitus with other specified complication: Secondary | ICD-10-CM | POA: Diagnosis not present

## 2020-06-14 DIAGNOSIS — I129 Hypertensive chronic kidney disease with stage 1 through stage 4 chronic kidney disease, or unspecified chronic kidney disease: Secondary | ICD-10-CM | POA: Diagnosis not present

## 2020-06-21 DIAGNOSIS — Z135 Encounter for screening for eye and ear disorders: Secondary | ICD-10-CM | POA: Diagnosis not present

## 2020-06-21 DIAGNOSIS — I129 Hypertensive chronic kidney disease with stage 1 through stage 4 chronic kidney disease, or unspecified chronic kidney disease: Secondary | ICD-10-CM | POA: Diagnosis not present

## 2020-06-21 DIAGNOSIS — N184 Chronic kidney disease, stage 4 (severe): Secondary | ICD-10-CM | POA: Diagnosis not present

## 2020-06-21 DIAGNOSIS — E785 Hyperlipidemia, unspecified: Secondary | ICD-10-CM | POA: Diagnosis not present

## 2020-06-21 DIAGNOSIS — E1169 Type 2 diabetes mellitus with other specified complication: Secondary | ICD-10-CM | POA: Diagnosis not present

## 2020-06-21 DIAGNOSIS — Z9119 Patient's noncompliance with other medical treatment and regimen: Secondary | ICD-10-CM | POA: Diagnosis not present

## 2020-06-21 DIAGNOSIS — E1122 Type 2 diabetes mellitus with diabetic chronic kidney disease: Secondary | ICD-10-CM | POA: Diagnosis not present

## 2020-10-12 DIAGNOSIS — E785 Hyperlipidemia, unspecified: Secondary | ICD-10-CM | POA: Diagnosis not present

## 2020-10-12 DIAGNOSIS — E1122 Type 2 diabetes mellitus with diabetic chronic kidney disease: Secondary | ICD-10-CM | POA: Diagnosis not present

## 2020-10-12 DIAGNOSIS — N184 Chronic kidney disease, stage 4 (severe): Secondary | ICD-10-CM | POA: Diagnosis not present

## 2020-10-12 DIAGNOSIS — E1169 Type 2 diabetes mellitus with other specified complication: Secondary | ICD-10-CM | POA: Diagnosis not present

## 2020-10-12 DIAGNOSIS — I129 Hypertensive chronic kidney disease with stage 1 through stage 4 chronic kidney disease, or unspecified chronic kidney disease: Secondary | ICD-10-CM | POA: Diagnosis not present

## 2020-10-19 DIAGNOSIS — E1122 Type 2 diabetes mellitus with diabetic chronic kidney disease: Secondary | ICD-10-CM | POA: Diagnosis not present

## 2020-10-19 DIAGNOSIS — E785 Hyperlipidemia, unspecified: Secondary | ICD-10-CM | POA: Diagnosis not present

## 2020-10-19 DIAGNOSIS — M1A9XX0 Chronic gout, unspecified, without tophus (tophi): Secondary | ICD-10-CM | POA: Diagnosis not present

## 2020-10-19 DIAGNOSIS — E1169 Type 2 diabetes mellitus with other specified complication: Secondary | ICD-10-CM | POA: Diagnosis not present

## 2020-10-19 DIAGNOSIS — I129 Hypertensive chronic kidney disease with stage 1 through stage 4 chronic kidney disease, or unspecified chronic kidney disease: Secondary | ICD-10-CM | POA: Diagnosis not present

## 2020-10-19 DIAGNOSIS — N184 Chronic kidney disease, stage 4 (severe): Secondary | ICD-10-CM | POA: Diagnosis not present

## 2021-02-13 DIAGNOSIS — I129 Hypertensive chronic kidney disease with stage 1 through stage 4 chronic kidney disease, or unspecified chronic kidney disease: Secondary | ICD-10-CM | POA: Diagnosis not present

## 2021-02-13 DIAGNOSIS — E785 Hyperlipidemia, unspecified: Secondary | ICD-10-CM | POA: Diagnosis not present

## 2021-02-13 DIAGNOSIS — N184 Chronic kidney disease, stage 4 (severe): Secondary | ICD-10-CM | POA: Diagnosis not present

## 2021-02-13 DIAGNOSIS — E1169 Type 2 diabetes mellitus with other specified complication: Secondary | ICD-10-CM | POA: Diagnosis not present

## 2021-02-13 DIAGNOSIS — E1122 Type 2 diabetes mellitus with diabetic chronic kidney disease: Secondary | ICD-10-CM | POA: Diagnosis not present

## 2021-02-20 DIAGNOSIS — Z0000 Encounter for general adult medical examination without abnormal findings: Secondary | ICD-10-CM | POA: Diagnosis not present

## 2021-02-20 DIAGNOSIS — E1122 Type 2 diabetes mellitus with diabetic chronic kidney disease: Secondary | ICD-10-CM | POA: Diagnosis not present

## 2021-02-20 DIAGNOSIS — Z9119 Patient's noncompliance with other medical treatment and regimen: Secondary | ICD-10-CM | POA: Diagnosis not present

## 2021-02-20 DIAGNOSIS — N184 Chronic kidney disease, stage 4 (severe): Secondary | ICD-10-CM | POA: Diagnosis not present

## 2021-02-20 DIAGNOSIS — I129 Hypertensive chronic kidney disease with stage 1 through stage 4 chronic kidney disease, or unspecified chronic kidney disease: Secondary | ICD-10-CM | POA: Diagnosis not present

## 2021-02-20 DIAGNOSIS — E785 Hyperlipidemia, unspecified: Secondary | ICD-10-CM | POA: Diagnosis not present

## 2021-02-20 DIAGNOSIS — E1169 Type 2 diabetes mellitus with other specified complication: Secondary | ICD-10-CM | POA: Diagnosis not present

## 2021-02-20 DIAGNOSIS — M1A9XX0 Chronic gout, unspecified, without tophus (tophi): Secondary | ICD-10-CM | POA: Diagnosis not present

## 2021-06-13 DIAGNOSIS — M1A9XX0 Chronic gout, unspecified, without tophus (tophi): Secondary | ICD-10-CM | POA: Diagnosis not present

## 2021-06-13 DIAGNOSIS — N184 Chronic kidney disease, stage 4 (severe): Secondary | ICD-10-CM | POA: Diagnosis not present

## 2021-06-13 DIAGNOSIS — E785 Hyperlipidemia, unspecified: Secondary | ICD-10-CM | POA: Diagnosis not present

## 2021-06-13 DIAGNOSIS — E1169 Type 2 diabetes mellitus with other specified complication: Secondary | ICD-10-CM | POA: Diagnosis not present

## 2021-06-13 DIAGNOSIS — E1122 Type 2 diabetes mellitus with diabetic chronic kidney disease: Secondary | ICD-10-CM | POA: Diagnosis not present

## 2021-06-13 DIAGNOSIS — I129 Hypertensive chronic kidney disease with stage 1 through stage 4 chronic kidney disease, or unspecified chronic kidney disease: Secondary | ICD-10-CM | POA: Diagnosis not present

## 2021-06-20 DIAGNOSIS — E1169 Type 2 diabetes mellitus with other specified complication: Secondary | ICD-10-CM | POA: Diagnosis not present

## 2021-06-20 DIAGNOSIS — I129 Hypertensive chronic kidney disease with stage 1 through stage 4 chronic kidney disease, or unspecified chronic kidney disease: Secondary | ICD-10-CM | POA: Diagnosis not present

## 2021-06-20 DIAGNOSIS — E785 Hyperlipidemia, unspecified: Secondary | ICD-10-CM | POA: Diagnosis not present

## 2021-06-20 DIAGNOSIS — Z91199 Patient's noncompliance with other medical treatment and regimen due to unspecified reason: Secondary | ICD-10-CM | POA: Diagnosis not present

## 2021-06-20 DIAGNOSIS — M1A9XX0 Chronic gout, unspecified, without tophus (tophi): Secondary | ICD-10-CM | POA: Diagnosis not present

## 2021-06-20 DIAGNOSIS — E1122 Type 2 diabetes mellitus with diabetic chronic kidney disease: Secondary | ICD-10-CM | POA: Diagnosis not present

## 2021-06-20 DIAGNOSIS — N184 Chronic kidney disease, stage 4 (severe): Secondary | ICD-10-CM | POA: Diagnosis not present

## 2021-06-26 DIAGNOSIS — G3184 Mild cognitive impairment, so stated: Secondary | ICD-10-CM | POA: Diagnosis not present

## 2021-06-26 DIAGNOSIS — E785 Hyperlipidemia, unspecified: Secondary | ICD-10-CM | POA: Diagnosis not present

## 2021-06-26 DIAGNOSIS — F0390 Unspecified dementia without behavioral disturbance: Secondary | ICD-10-CM | POA: Diagnosis present

## 2021-06-26 DIAGNOSIS — E538 Deficiency of other specified B group vitamins: Secondary | ICD-10-CM | POA: Diagnosis not present

## 2021-06-26 DIAGNOSIS — E1169 Type 2 diabetes mellitus with other specified complication: Secondary | ICD-10-CM | POA: Diagnosis not present

## 2021-07-05 DIAGNOSIS — F439 Reaction to severe stress, unspecified: Secondary | ICD-10-CM | POA: Diagnosis not present

## 2021-07-05 DIAGNOSIS — F4321 Adjustment disorder with depressed mood: Secondary | ICD-10-CM | POA: Diagnosis not present

## 2021-07-05 DIAGNOSIS — R413 Other amnesia: Secondary | ICD-10-CM | POA: Diagnosis not present

## 2021-07-27 DIAGNOSIS — E538 Deficiency of other specified B group vitamins: Secondary | ICD-10-CM | POA: Diagnosis not present

## 2021-08-27 DIAGNOSIS — E538 Deficiency of other specified B group vitamins: Secondary | ICD-10-CM | POA: Diagnosis not present

## 2021-09-25 DIAGNOSIS — M1A9XX0 Chronic gout, unspecified, without tophus (tophi): Secondary | ICD-10-CM | POA: Diagnosis not present

## 2021-09-25 DIAGNOSIS — E1122 Type 2 diabetes mellitus with diabetic chronic kidney disease: Secondary | ICD-10-CM | POA: Diagnosis not present

## 2021-09-25 DIAGNOSIS — N184 Chronic kidney disease, stage 4 (severe): Secondary | ICD-10-CM | POA: Diagnosis not present

## 2021-09-25 DIAGNOSIS — G3184 Mild cognitive impairment, so stated: Secondary | ICD-10-CM | POA: Diagnosis not present

## 2021-09-25 DIAGNOSIS — E785 Hyperlipidemia, unspecified: Secondary | ICD-10-CM | POA: Diagnosis not present

## 2021-09-25 DIAGNOSIS — I129 Hypertensive chronic kidney disease with stage 1 through stage 4 chronic kidney disease, or unspecified chronic kidney disease: Secondary | ICD-10-CM | POA: Diagnosis not present

## 2021-09-25 DIAGNOSIS — E538 Deficiency of other specified B group vitamins: Secondary | ICD-10-CM | POA: Diagnosis not present

## 2021-09-25 DIAGNOSIS — E1169 Type 2 diabetes mellitus with other specified complication: Secondary | ICD-10-CM | POA: Diagnosis not present

## 2021-10-10 DIAGNOSIS — H35033 Hypertensive retinopathy, bilateral: Secondary | ICD-10-CM | POA: Diagnosis not present

## 2021-11-07 IMAGING — US US EXTREM LOW VENOUS*R*
1 series · 14 of 24 positions shown · non-contrast
Comparison: None.

CLINICAL DATA: Pain

EXAM:
RIGHT LOWER EXTREMITY VENOUS DOPPLER ULTRASOUND
TECHNIQUE: Gray-scale sonography with compression, as well as color and duplex
ultrasound, were performed to evaluate the deep venous system(s)
from the level of the common femoral vein through the popliteal and
proximal calf veins.

[Series 1: us venous img lower uni right (dvt) · portal-venous · 14 of 36 slices shown]
[im 1/36]
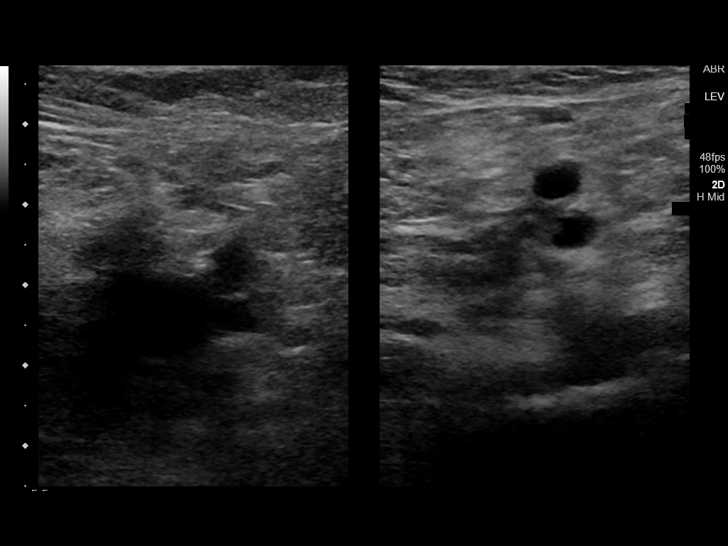
[im 4/36]
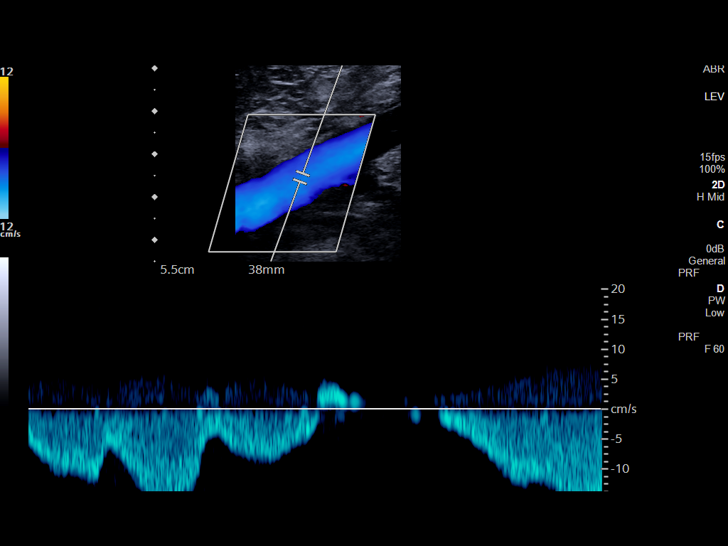
[im 7/36]
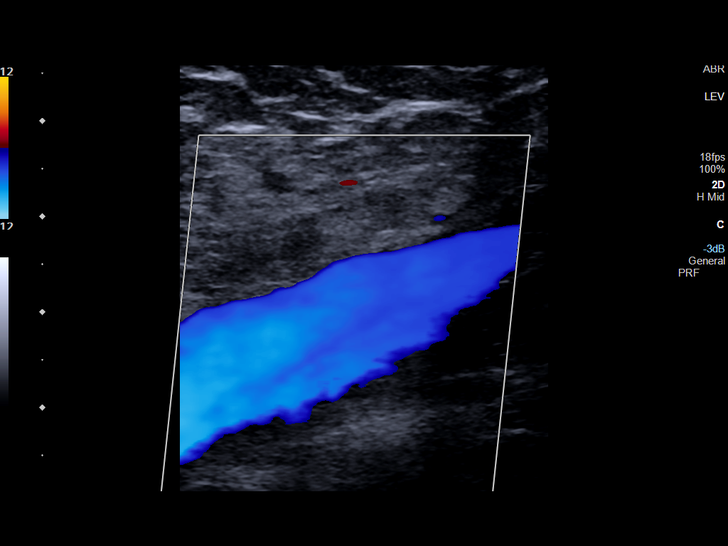
[im 10/36]
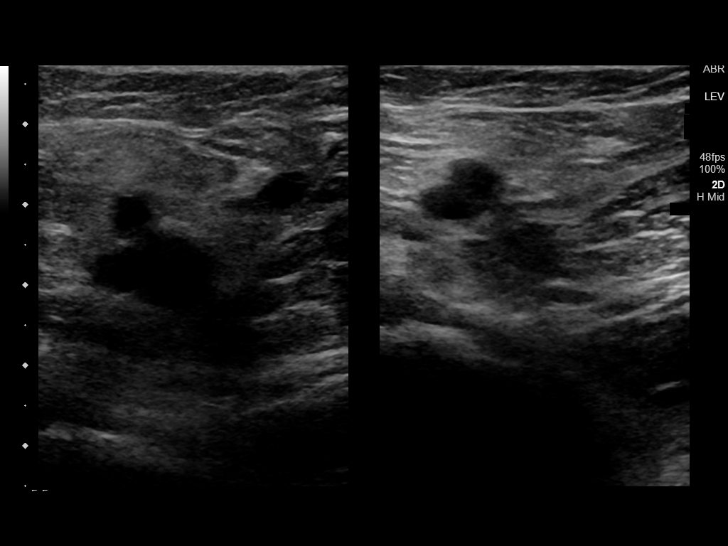
[im 11/36]
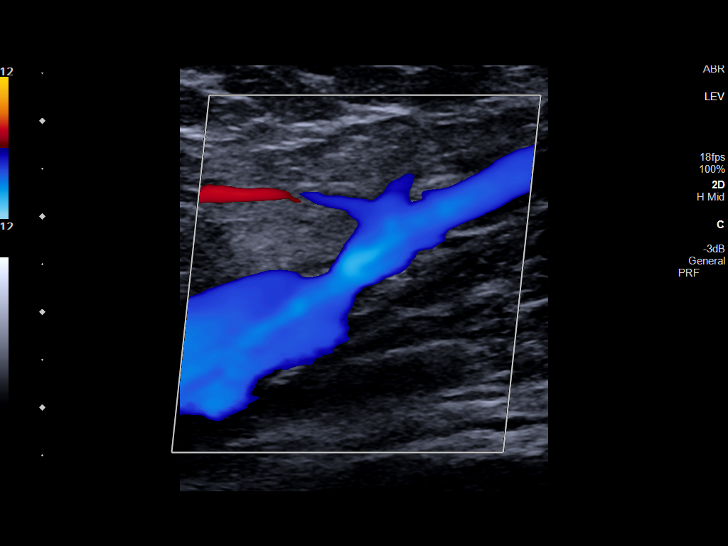
[im 14/36]
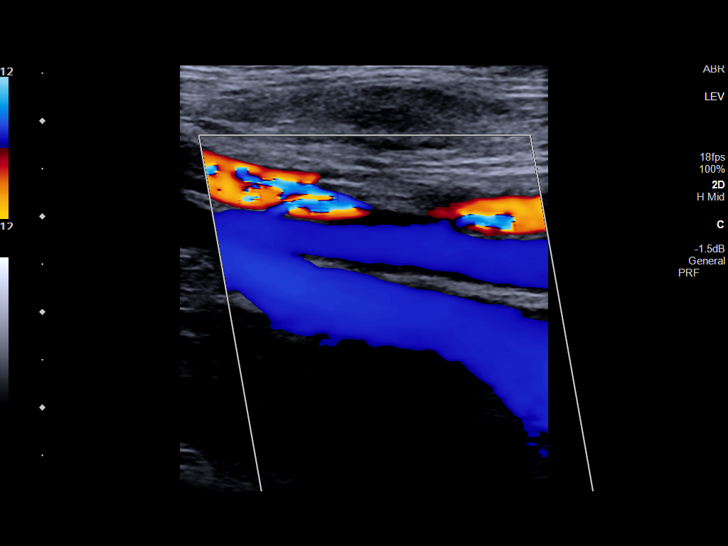
[im 17/36]
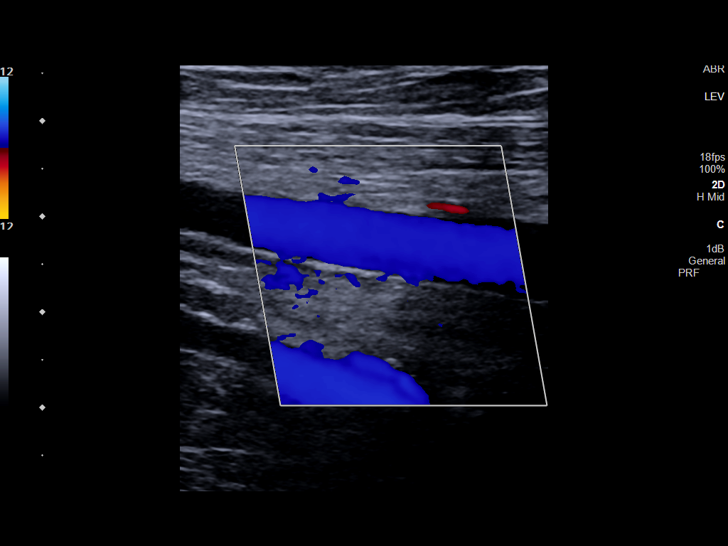
[im 19/36]
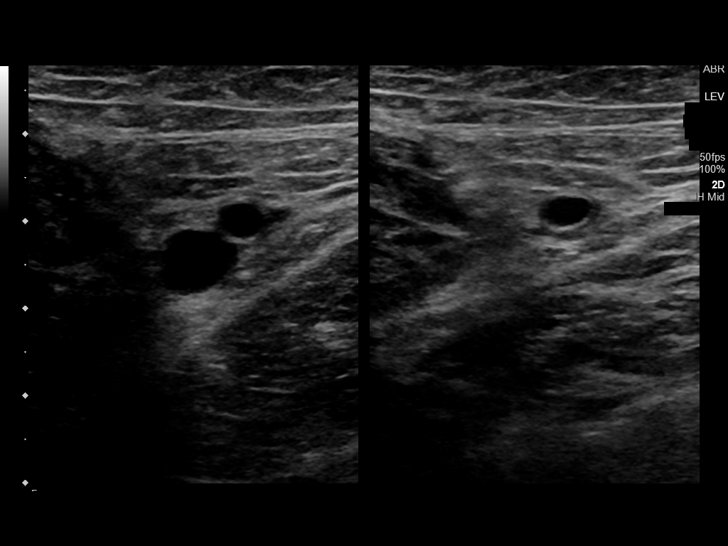
[im 22/36]
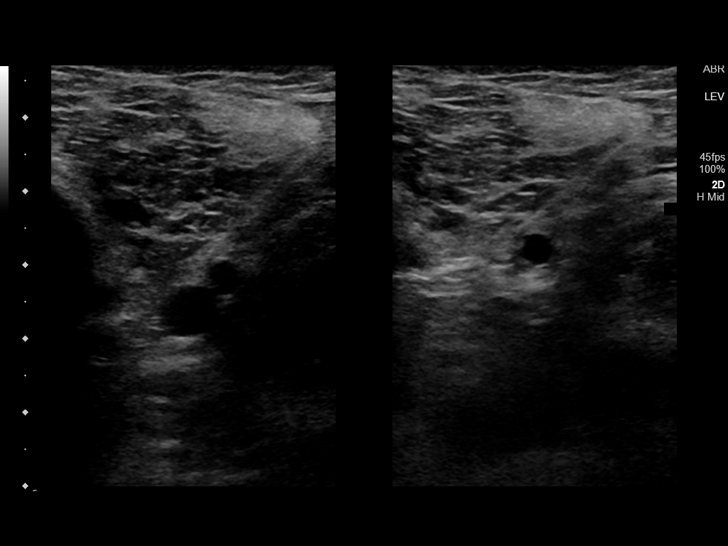
[im 25/36]
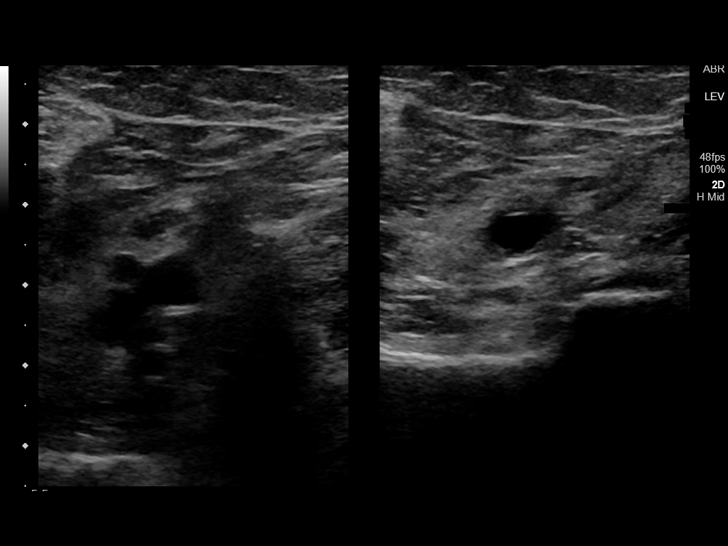
[im 28/36]
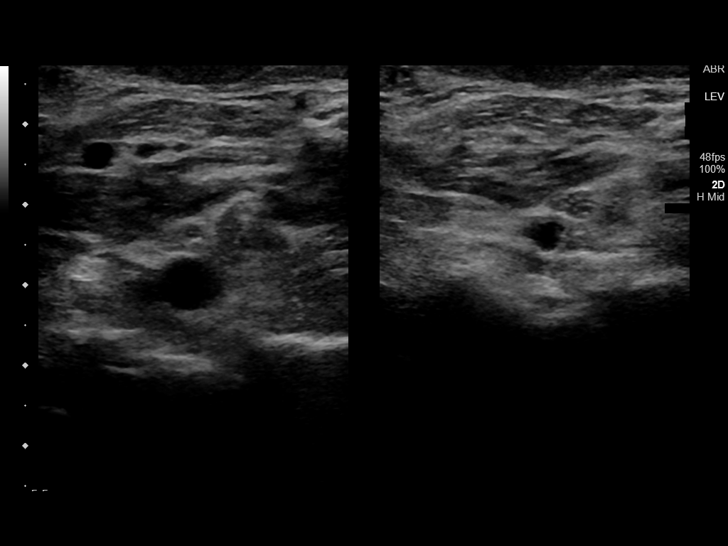
[im 29/36]
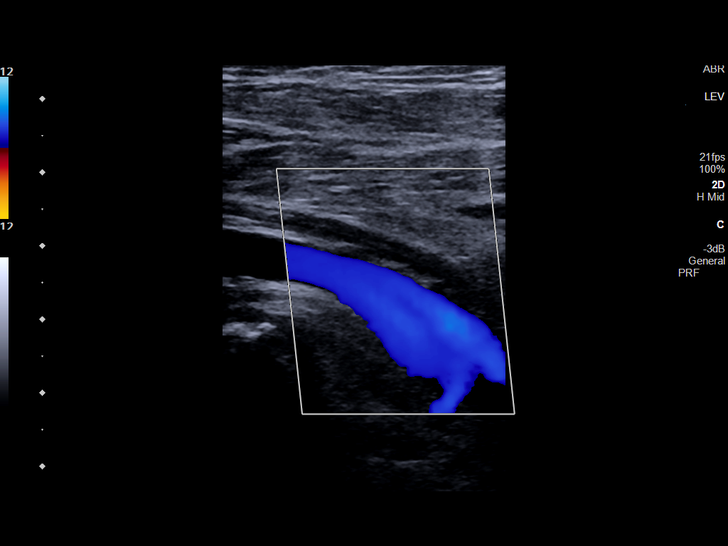
[im 32/36]
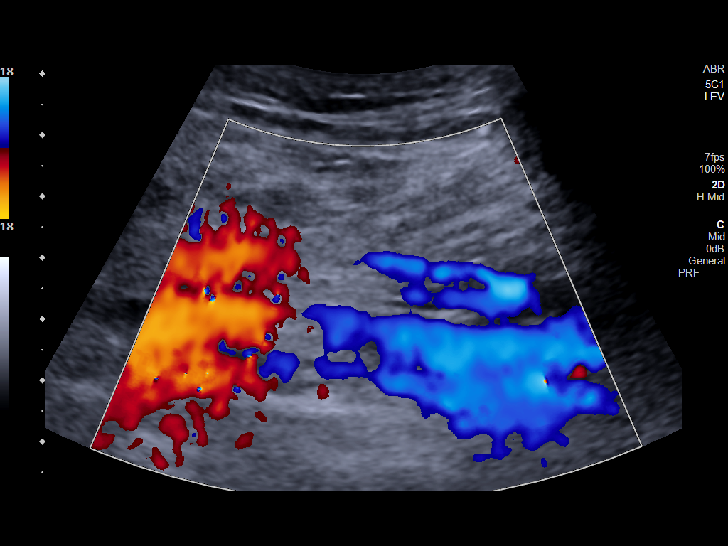
[im 36/36]
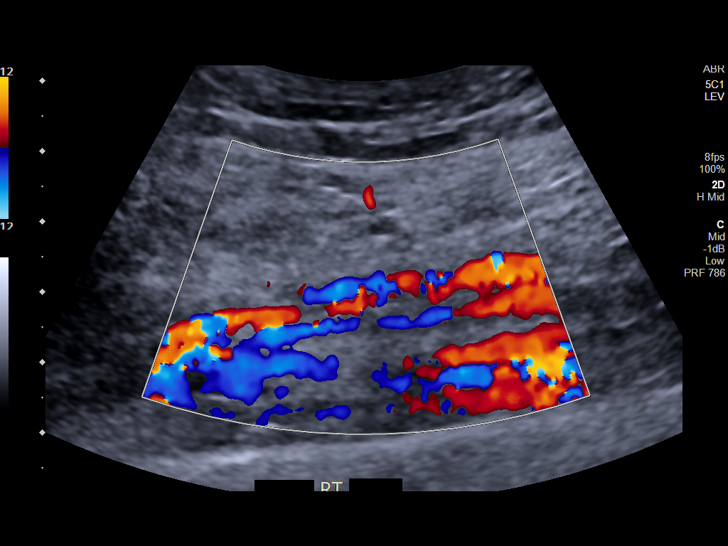

[14 of 24 positions shown; findings below may reference images not displayed]

FINDINGS: VENOUS

Normal compressibility of the common femoral, superficial femoral,
and popliteal veins, as well as the visualized calf veins.
Visualized portions of profunda femoral vein and great saphenous
vein unremarkable. No filling defects to suggest DVT on grayscale or
color Doppler imaging. Doppler waveforms show normal direction of
venous flow, normal respiratory plasticity and response to
augmentation.

Limited views of the contralateral common femoral vein are
unremarkable.

OTHER

None.

Limitations: none
IMPRESSION: Negative.

## 2022-02-14 DIAGNOSIS — E1122 Type 2 diabetes mellitus with diabetic chronic kidney disease: Secondary | ICD-10-CM | POA: Diagnosis not present

## 2022-02-14 DIAGNOSIS — E785 Hyperlipidemia, unspecified: Secondary | ICD-10-CM | POA: Diagnosis not present

## 2022-02-14 DIAGNOSIS — M1A9XX0 Chronic gout, unspecified, without tophus (tophi): Secondary | ICD-10-CM | POA: Diagnosis not present

## 2022-02-14 DIAGNOSIS — I129 Hypertensive chronic kidney disease with stage 1 through stage 4 chronic kidney disease, or unspecified chronic kidney disease: Secondary | ICD-10-CM | POA: Diagnosis not present

## 2022-02-14 DIAGNOSIS — N184 Chronic kidney disease, stage 4 (severe): Secondary | ICD-10-CM | POA: Diagnosis not present

## 2022-02-14 DIAGNOSIS — E1169 Type 2 diabetes mellitus with other specified complication: Secondary | ICD-10-CM | POA: Diagnosis not present

## 2022-02-21 DIAGNOSIS — E1122 Type 2 diabetes mellitus with diabetic chronic kidney disease: Secondary | ICD-10-CM | POA: Diagnosis not present

## 2022-02-21 DIAGNOSIS — N184 Chronic kidney disease, stage 4 (severe): Secondary | ICD-10-CM | POA: Diagnosis not present

## 2022-02-21 DIAGNOSIS — I129 Hypertensive chronic kidney disease with stage 1 through stage 4 chronic kidney disease, or unspecified chronic kidney disease: Secondary | ICD-10-CM | POA: Diagnosis not present

## 2022-02-21 DIAGNOSIS — E1165 Type 2 diabetes mellitus with hyperglycemia: Secondary | ICD-10-CM | POA: Diagnosis not present

## 2022-02-21 DIAGNOSIS — G3184 Mild cognitive impairment, so stated: Secondary | ICD-10-CM | POA: Diagnosis not present

## 2022-02-21 DIAGNOSIS — Z0000 Encounter for general adult medical examination without abnormal findings: Secondary | ICD-10-CM | POA: Diagnosis not present

## 2022-02-21 DIAGNOSIS — Z91199 Patient's noncompliance with other medical treatment and regimen due to unspecified reason: Secondary | ICD-10-CM | POA: Diagnosis not present

## 2022-02-21 DIAGNOSIS — E1169 Type 2 diabetes mellitus with other specified complication: Secondary | ICD-10-CM | POA: Diagnosis not present

## 2022-02-21 DIAGNOSIS — E785 Hyperlipidemia, unspecified: Secondary | ICD-10-CM | POA: Diagnosis not present

## 2022-03-13 DIAGNOSIS — I878 Other specified disorders of veins: Secondary | ICD-10-CM | POA: Diagnosis not present

## 2022-03-13 DIAGNOSIS — L03115 Cellulitis of right lower limb: Secondary | ICD-10-CM | POA: Diagnosis not present

## 2022-03-13 DIAGNOSIS — E1165 Type 2 diabetes mellitus with hyperglycemia: Secondary | ICD-10-CM | POA: Diagnosis not present

## 2022-03-18 DIAGNOSIS — M79671 Pain in right foot: Secondary | ICD-10-CM | POA: Diagnosis not present

## 2022-03-18 DIAGNOSIS — E1140 Type 2 diabetes mellitus with diabetic neuropathy, unspecified: Secondary | ICD-10-CM | POA: Diagnosis not present

## 2022-03-18 DIAGNOSIS — B351 Tinea unguium: Secondary | ICD-10-CM | POA: Diagnosis not present

## 2022-03-18 DIAGNOSIS — L02611 Cutaneous abscess of right foot: Secondary | ICD-10-CM | POA: Diagnosis not present

## 2022-03-18 DIAGNOSIS — L97512 Non-pressure chronic ulcer of other part of right foot with fat layer exposed: Secondary | ICD-10-CM | POA: Diagnosis not present

## 2022-03-18 DIAGNOSIS — L02619 Cutaneous abscess of unspecified foot: Secondary | ICD-10-CM | POA: Diagnosis not present

## 2022-03-18 DIAGNOSIS — L03115 Cellulitis of right lower limb: Secondary | ICD-10-CM | POA: Diagnosis not present

## 2022-03-18 DIAGNOSIS — L03119 Cellulitis of unspecified part of limb: Secondary | ICD-10-CM | POA: Diagnosis not present

## 2022-04-01 DIAGNOSIS — L97512 Non-pressure chronic ulcer of other part of right foot with fat layer exposed: Secondary | ICD-10-CM | POA: Diagnosis not present

## 2022-04-01 DIAGNOSIS — E1140 Type 2 diabetes mellitus with diabetic neuropathy, unspecified: Secondary | ICD-10-CM | POA: Diagnosis not present

## 2022-04-29 DIAGNOSIS — E119 Type 2 diabetes mellitus without complications: Secondary | ICD-10-CM | POA: Insufficient documentation

## 2022-04-29 DIAGNOSIS — M13811 Other specified arthritis, right shoulder: Secondary | ICD-10-CM | POA: Diagnosis not present

## 2022-06-10 DIAGNOSIS — M13812 Other specified arthritis, left shoulder: Secondary | ICD-10-CM | POA: Diagnosis not present

## 2022-06-17 DIAGNOSIS — M79671 Pain in right foot: Secondary | ICD-10-CM | POA: Diagnosis not present

## 2022-06-17 DIAGNOSIS — L03115 Cellulitis of right lower limb: Secondary | ICD-10-CM | POA: Diagnosis not present

## 2022-06-20 DIAGNOSIS — L03115 Cellulitis of right lower limb: Secondary | ICD-10-CM | POA: Diagnosis not present

## 2022-06-20 DIAGNOSIS — L97512 Non-pressure chronic ulcer of other part of right foot with fat layer exposed: Secondary | ICD-10-CM | POA: Diagnosis not present

## 2022-06-20 DIAGNOSIS — B351 Tinea unguium: Secondary | ICD-10-CM | POA: Diagnosis not present

## 2022-06-20 DIAGNOSIS — E1140 Type 2 diabetes mellitus with diabetic neuropathy, unspecified: Secondary | ICD-10-CM | POA: Diagnosis not present

## 2022-06-28 ENCOUNTER — Encounter: Payer: Self-pay | Admitting: Internal Medicine

## 2022-06-28 ENCOUNTER — Emergency Department: Payer: PPO

## 2022-06-28 ENCOUNTER — Inpatient Hospital Stay
Admission: EM | Admit: 2022-06-28 | Discharge: 2022-06-30 | DRG: 617 | Disposition: A | Discharge status: Home / Self Care | Payer: PPO | Attending: Obstetrics and Gynecology | Admitting: Obstetrics and Gynecology

## 2022-06-28 ENCOUNTER — Other Ambulatory Visit: Payer: Self-pay

## 2022-06-28 DIAGNOSIS — I129 Hypertensive chronic kidney disease with stage 1 through stage 4 chronic kidney disease, or unspecified chronic kidney disease: Secondary | ICD-10-CM | POA: Diagnosis not present

## 2022-06-28 DIAGNOSIS — E1122 Type 2 diabetes mellitus with diabetic chronic kidney disease: Secondary | ICD-10-CM | POA: Diagnosis present

## 2022-06-28 DIAGNOSIS — E1142 Type 2 diabetes mellitus with diabetic polyneuropathy: Secondary | ICD-10-CM | POA: Diagnosis not present

## 2022-06-28 DIAGNOSIS — I10 Essential (primary) hypertension: Secondary | ICD-10-CM | POA: Diagnosis not present

## 2022-06-28 DIAGNOSIS — L089 Local infection of the skin and subcutaneous tissue, unspecified: Secondary | ICD-10-CM | POA: Diagnosis present

## 2022-06-28 DIAGNOSIS — M009 Pyogenic arthritis, unspecified: Secondary | ICD-10-CM | POA: Diagnosis present

## 2022-06-28 DIAGNOSIS — S92811A Other fracture of right foot, initial encounter for closed fracture: Secondary | ICD-10-CM | POA: Diagnosis not present

## 2022-06-28 DIAGNOSIS — E11621 Type 2 diabetes mellitus with foot ulcer: Secondary | ICD-10-CM | POA: Diagnosis not present

## 2022-06-28 DIAGNOSIS — M7989 Other specified soft tissue disorders: Secondary | ICD-10-CM | POA: Diagnosis not present

## 2022-06-28 DIAGNOSIS — M1A9XX0 Chronic gout, unspecified, without tophus (tophi): Secondary | ICD-10-CM | POA: Diagnosis present

## 2022-06-28 DIAGNOSIS — N184 Chronic kidney disease, stage 4 (severe): Secondary | ICD-10-CM | POA: Diagnosis not present

## 2022-06-28 DIAGNOSIS — L97519 Non-pressure chronic ulcer of other part of right foot with unspecified severity: Secondary | ICD-10-CM | POA: Diagnosis not present

## 2022-06-28 DIAGNOSIS — E1159 Type 2 diabetes mellitus with other circulatory complications: Secondary | ICD-10-CM | POA: Insufficient documentation

## 2022-06-28 DIAGNOSIS — N189 Chronic kidney disease, unspecified: Secondary | ICD-10-CM | POA: Diagnosis not present

## 2022-06-28 DIAGNOSIS — M25474 Effusion, right foot: Secondary | ICD-10-CM | POA: Diagnosis not present

## 2022-06-28 DIAGNOSIS — E1165 Type 2 diabetes mellitus with hyperglycemia: Secondary | ICD-10-CM | POA: Diagnosis present

## 2022-06-28 DIAGNOSIS — L97514 Non-pressure chronic ulcer of other part of right foot with necrosis of bone: Secondary | ICD-10-CM | POA: Diagnosis not present

## 2022-06-28 DIAGNOSIS — E1169 Type 2 diabetes mellitus with other specified complication: Principal | ICD-10-CM | POA: Diagnosis present

## 2022-06-28 DIAGNOSIS — Z872 Personal history of diseases of the skin and subcutaneous tissue: Secondary | ICD-10-CM | POA: Diagnosis not present

## 2022-06-28 DIAGNOSIS — F0390 Unspecified dementia without behavioral disturbance: Secondary | ICD-10-CM | POA: Diagnosis present

## 2022-06-28 DIAGNOSIS — M86271 Subacute osteomyelitis, right ankle and foot: Secondary | ICD-10-CM | POA: Diagnosis not present

## 2022-06-28 DIAGNOSIS — E11628 Type 2 diabetes mellitus with other skin complications: Secondary | ICD-10-CM | POA: Diagnosis not present

## 2022-06-28 DIAGNOSIS — M86171 Other acute osteomyelitis, right ankle and foot: Secondary | ICD-10-CM | POA: Diagnosis present

## 2022-06-28 DIAGNOSIS — M868X7 Other osteomyelitis, ankle and foot: Secondary | ICD-10-CM | POA: Diagnosis not present

## 2022-06-28 DIAGNOSIS — M869 Osteomyelitis, unspecified: Secondary | ICD-10-CM | POA: Diagnosis not present

## 2022-06-28 DIAGNOSIS — G3184 Mild cognitive impairment, so stated: Secondary | ICD-10-CM | POA: Diagnosis not present

## 2022-06-28 LAB — CBC WITH DIFFERENTIAL/PLATELET
Abs Immature Granulocytes: 0.04 10*3/uL (ref 0.00–0.07)
Basophils Absolute: 0.1 10*3/uL (ref 0.0–0.1)
Basophils Relative: 1 %
Eosinophils Absolute: 0.1 10*3/uL (ref 0.0–0.5)
Eosinophils Relative: 1 %
HCT: 35.3 % — ABNORMAL LOW (ref 36.0–46.0)
Hemoglobin: 11.6 g/dL — ABNORMAL LOW (ref 12.0–15.0)
Immature Granulocytes: 0 %
Lymphocytes Relative: 16 %
Lymphs Abs: 1.6 10*3/uL (ref 0.7–4.0)
MCH: 27.8 pg (ref 26.0–34.0)
MCHC: 32.9 g/dL (ref 30.0–36.0)
MCV: 84.4 fL (ref 80.0–100.0)
Monocytes Absolute: 0.6 10*3/uL (ref 0.1–1.0)
Monocytes Relative: 6 %
Neutro Abs: 7.9 10*3/uL — ABNORMAL HIGH (ref 1.7–7.7)
Neutrophils Relative %: 76 %
Platelets: 422 10*3/uL — ABNORMAL HIGH (ref 150–400)
RBC: 4.18 MIL/uL (ref 3.87–5.11)
RDW: 14.9 % (ref 11.5–15.5)
WBC: 10.3 10*3/uL (ref 4.0–10.5)
nRBC: 0 % (ref 0.0–0.2)

## 2022-06-28 LAB — COMPREHENSIVE METABOLIC PANEL
ALT: 18 U/L (ref 0–44)
AST: 23 U/L (ref 15–41)
Albumin: 3.5 g/dL (ref 3.5–5.0)
Alkaline Phosphatase: 87 U/L (ref 38–126)
Anion gap: 11 (ref 5–15)
BUN: 40 mg/dL — ABNORMAL HIGH (ref 8–23)
CO2: 24 mmol/L (ref 22–32)
Calcium: 9.1 mg/dL (ref 8.9–10.3)
Chloride: 101 mmol/L (ref 98–111)
Creatinine, Ser: 1.67 mg/dL — ABNORMAL HIGH (ref 0.44–1.00)
GFR, Estimated: 31 mL/min — ABNORMAL LOW (ref >60)
Glucose, Bld: 256 mg/dL — ABNORMAL HIGH (ref 70–99)
Potassium: 3.3 mmol/L — ABNORMAL LOW (ref 3.5–5.1)
Sodium: 136 mmol/L (ref 135–145)
Total Bilirubin: 0.8 mg/dL (ref 0.3–1.2)
Total Protein: 6.8 g/dL (ref 6.5–8.1)

## 2022-06-28 LAB — MRSA NEXT GEN BY PCR, NASAL: MRSA by PCR Next Gen: NOT DETECTED

## 2022-06-28 LAB — HEMOGLOBIN A1C
Hgb A1c MFr Bld: 7.4 % — ABNORMAL HIGH (ref 4.8–5.6)
Mean Plasma Glucose: 165.68 mg/dL

## 2022-06-28 LAB — GLUCOSE, CAPILLARY
Glucose-Capillary: 114 mg/dL — ABNORMAL HIGH (ref 70–99)
Glucose-Capillary: 176 mg/dL — ABNORMAL HIGH (ref 70–99)

## 2022-06-28 LAB — LACTIC ACID, PLASMA
Lactic Acid, Venous: 2.1 mmol/L (ref 0.5–1.9)
Lactic Acid, Venous: 1.6 mmol/L (ref 0.5–1.9)

## 2022-06-28 MED ORDER — INSULIN GLARGINE-YFGN 100 UNIT/ML ~~LOC~~ SOLN
6.0000 [IU] | Freq: Every day | SUBCUTANEOUS | Status: DC
  Administered 2022-06-28: 6 [IU] via SUBCUTANEOUS
  Filled 2022-06-28: qty 0.06

## 2022-06-28 MED ORDER — ACETAMINOPHEN 650 MG RE SUPP: 650.0000 mg | Freq: Four times a day (QID) | RECTAL | Status: DC | PRN

## 2022-06-28 MED ORDER — INSULIN ASPART 100 UNIT/ML IJ SOLN: 0.0000 [IU] | Freq: Three times a day (TID) | INTRAMUSCULAR | Status: DC

## 2022-06-28 MED ORDER — FEBUXOSTAT 40 MG PO TABS
80.0000 mg | ORAL_TABLET | Freq: Every day | ORAL | Status: DC
  Administered 2022-06-28 – 2022-06-30 (×3): 80 mg via ORAL
  Filled 2022-06-28 (×3): qty 2

## 2022-06-28 MED ORDER — VANCOMYCIN HCL 750 MG/150ML IV SOLN: 750.0000 mg | INTRAVENOUS | Status: DC

## 2022-06-28 MED ORDER — AMLODIPINE BESYLATE 5 MG PO TABS
2.5000 mg | ORAL_TABLET | Freq: Every day | ORAL | Status: DC
  Administered 2022-06-28 – 2022-06-29 (×2): 2.5 mg via ORAL
  Filled 2022-06-28 (×2): qty 1

## 2022-06-28 MED ORDER — LISINOPRIL 10 MG PO TABS
10.0000 mg | ORAL_TABLET | Freq: Every day | ORAL | Status: DC
  Administered 2022-06-28 – 2022-06-29 (×2): 10 mg via ORAL
  Filled 2022-06-28 (×2): qty 1

## 2022-06-28 MED ORDER — SODIUM CHLORIDE 0.9 % IV SOLN
2.0000 g | Freq: Once | INTRAVENOUS | Status: AC
  Administered 2022-06-28: 2 g via INTRAVENOUS
  Filled 2022-06-28: qty 12.5

## 2022-06-28 MED ORDER — PIPERACILLIN-TAZOBACTAM IN DEX 2-0.25 GM/50ML IV SOLN
2.2500 g | Freq: Four times a day (QID) | INTRAVENOUS | Status: DC
  Administered 2022-06-28 – 2022-06-29 (×4): 2.25 g via INTRAVENOUS
  Filled 2022-06-28 (×5): qty 50

## 2022-06-28 MED ORDER — SENNOSIDES-DOCUSATE SODIUM 8.6-50 MG PO TABS: 1.0000 | ORAL_TABLET | Freq: Every evening | ORAL | Status: DC | PRN

## 2022-06-28 MED ORDER — PANTOPRAZOLE SODIUM 40 MG PO TBEC
40.0000 mg | DELAYED_RELEASE_TABLET | Freq: Every day | ORAL | Status: DC
  Administered 2022-06-28 – 2022-06-30 (×3): 40 mg via ORAL
  Filled 2022-06-28 (×3): qty 1

## 2022-06-28 MED ORDER — ENOXAPARIN SODIUM 30 MG/0.3ML IJ SOSY
30.0000 mg | PREFILLED_SYRINGE | INTRAMUSCULAR | Status: DC
  Administered 2022-06-28 – 2022-06-29 (×2): 30 mg via SUBCUTANEOUS
  Filled 2022-06-28 (×2): qty 0.3

## 2022-06-28 MED ORDER — ACETAMINOPHEN 325 MG PO TABS
650.0000 mg | ORAL_TABLET | Freq: Four times a day (QID) | ORAL | Status: DC | PRN
  Filled 2022-06-28: qty 2

## 2022-06-28 MED ORDER — VANCOMYCIN HCL IN DEXTROSE 1-5 GM/200ML-% IV SOLN
1000.0000 mg | Freq: Once | INTRAVENOUS | Status: AC
  Administered 2022-06-28: 1000 mg via INTRAVENOUS
  Filled 2022-06-28: qty 200

## 2022-06-28 MED ORDER — METOPROLOL TARTRATE 25 MG PO TABS
25.0000 mg | ORAL_TABLET | Freq: Two times a day (BID) | ORAL | Status: DC
  Administered 2022-06-28 – 2022-06-30 (×4): 25 mg via ORAL
  Filled 2022-06-28 (×4): qty 1

## 2022-06-28 MED ORDER — METRONIDAZOLE 500 MG/100ML IV SOLN
500.0000 mg | Freq: Once | INTRAVENOUS | Status: AC
  Administered 2022-06-28: 500 mg via INTRAVENOUS
  Filled 2022-06-28: qty 100

## 2022-06-28 MED ORDER — ASPIRIN 81 MG PO TBEC
81.0000 mg | DELAYED_RELEASE_TABLET | Freq: Every day | ORAL | Status: DC
  Administered 2022-06-28 – 2022-06-30 (×3): 81 mg via ORAL
  Filled 2022-06-28 (×3): qty 1

## 2022-06-28 MED ORDER — RISAQUAD PO CAPS
2.0000 | ORAL_CAPSULE | Freq: Three times a day (TID) | ORAL | Status: DC
  Administered 2022-06-28 – 2022-06-30 (×6): 2 via ORAL
  Filled 2022-06-28 (×6): qty 2

## 2022-06-28 NOTE — Consult Note (Addendum)
Pharmacy Antibiotic Note  Joy Tapia is a 82 y.o. female admitted on 06/28/2022 with  Osteomyelitis .  Pharmacy has been consulted for vancomycin and Zosyn dosing.  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  Plan: Vancomycin 1000 mg IV x 1 dose, followed by Vancomycin 750 mg IV Q 48 hrs. Goal AUC 400-550. Expected AUC: 502/Cmin 12.6 SCr used: 1.67   Zosyn 2.25 gm IV Q6H  Height: 5' (152.4 cm) Weight: 50.8 kg (112 lb) IBW/kg (Calculated) : 45.5  Temp (24hrs), Avg:97.5 F (36.4 C), Min:97.5 F (36.4 C), Max:97.5 F (36.4 C)  Recent Labs  Lab 06/28/22 1015  WBC 10.3  CREATININE 1.67*  LATICACIDVEN 2.1*    Estimated Creatinine Clearance: 19.3 mL/min (A) (by C-G formula based on SCr of 1.67 mg/dL (H)).    Allergies  Allergen Reactions   Azithromycin Other (See Comments)    Pt states burns her insides.    Antimicrobials this admission: Vancomycin 2/2 >>  Zosyn 2/2 >>  2/2 Cefepime x 1 dose 2/2 Metronidazole x 1 dose   Dose adjustments this admission: N/A  Microbiology results: 2/2 BCx: pending   Thank you for allowing pharmacy to be a part of this patient's care.  Alison Murray 06/28/2022 3:21 PM

## 2022-06-28 NOTE — ED Provider Notes (Signed)
[facility] Provider Note    Event Date/Time   First MD Initiated Contact with Patient 06/28/22 [id number]     (approximate)   History   Chief Complaint: Blood Infection   HPI  Joy Tapia is a 82 y.o. female with a history of hypertension diabetes who comes to the ED due to right great toe redness and swelling.  This has been going on for a few weeks.  She saw podiatry 1 week ago, had debridement of her wound and was recommended to go to the ED for admission due to concern for osteomyelitis.  Patient decided to go home, was given Augmentin and doxycycline at that time which she has taken over the past week.  She comes to the ED today for evaluation.  No fever chills chest pain or shortness of breath.     Physical Exam   Triage Vital Signs: ED Triage Vitals  Enc Vitals Group     BP 06/28/22 0955 (!) 154/63     Pulse Rate 06/28/22 0954 99     Resp 06/28/22 0954 18     Temp 06/28/22 0954 (!) 97.5 F (36.4 C)     Temp Source 06/28/22 0954 Oral     SpO2 06/28/22 0954 100 %     Weight 06/28/22 0955 112 lb (50.8 kg)     Height 06/28/22 0955 5' (1.524 m)     Head Circumference --      Peak Flow --      Pain Score 06/28/22 0955 0     Pain Loc --      Pain Edu? --      Excl. in [place]? --     Most recent vital signs: Vitals:   06/28/22 0954 06/28/22 0955  BP:  (!) 154/63  Pulse: 99   Resp: 18   Temp: (!) 97.5 F (36.4 C)   SpO2: 100%     General: Awake, no distress.  CV:  Good peripheral perfusion.  Normal distal pulses Resp:  Normal effort.  Abd:  No distention.  Other:  Right first toe and first MTP joint are red swollen and warm.  No pain with range of motion of the first toe.  No proximal inflammatory changes.  Left foot is normal.   ED Results / Procedures / Treatments   Labs (all labs ordered are listed, but only abnormal results are displayed) Labs Reviewed  LACTIC ACID, PLASMA - Abnormal; Notable for the following components:       Result Value   Lactic Acid, Venous 2.1 (*)    All other components within normal limits  COMPREHENSIVE METABOLIC PANEL - Abnormal; Notable for the following components:   Potassium 3.3 (*)    Glucose, Bld 256 (*)    BUN 40 (*)    Creatinine, Ser 1.67 (*)    GFR, Estimated 31 (*)    All other components within normal limits  CBC WITH DIFFERENTIAL/PLATELET - Abnormal; Notable for the following components:   Hemoglobin 11.6 (*)    HCT 35.3 (*)    Platelets 422 (*)    Neutro Abs 7.9 (*)    All other components within normal limits  CULTURE, BLOOD (ROUTINE X 2)  CULTURE, BLOOD (ROUTINE X 2)  LACTIC ACID, PLASMA     EKG Interpreted by me Normal sinus rhythm rate of 92.  Normal axis intervals QRS ST segments and T waves.   RADIOLOGY X-ray right foot interpreted by me, negative for fracture or obvious  bone degeneration.  Radiology report reviewed.  MRI right foot shows osteomyelitis of great toe and first metatarsal along with suspected septic arthritis of the first MTP joint and tenosynovitis of the flexor hallucis longus   PROCEDURES:  Procedures   MEDICATIONS ORDERED IN ED: Medications  ceFEPIme (MAXIPIME) 2 g in sodium chloride 0.9 % 100 mL IVPB (0 g Intravenous Stopped 06/28/22 1130)  metroNIDAZOLE (FLAGYL) IVPB 500 mg (0 mg Intravenous Stopped 06/28/22 1239)  vancomycin (VANCOCIN) IVPB 1000 mg/200 mL premix (1,000 mg Intravenous New Bag/Given 06/28/22 1245)     IMPRESSION / MDM / ASSESSMENT AND PLAN / ED COURSE  I reviewed the triage vital signs and the nursing notes.  DDx: Cellulitis, foot abscess, foot osteomyelitis, hyperglycemia, dehydration, AKI, electrolyte abnormality  Patient's presentation is most consistent with acute presentation with potential threat to life or bodily function.  Patient arrives to the ED with signs of persistent diabetic foot infection despite a week of broad-spectrum antibiotic coverage outpatient.  Will check labs today, obtain x-ray.   Will discuss with podiatry..  Will Continue IV antibiotic coverage with cefepime vancomycin Flagyl.  ----------------------------------------- 2:16 PM on 06/28/2022 ----------------------------------------- Patient seen by podiatry who recommended MRI.  MRI is positive for osteomyelitis of the foot along with suspected septic arthritis of the first MTP joint and tenosynovitis.  Dr. Vickki Muff will discuss surgical plan with the patient.  Case discussed with the hospitalist for further medical management.       FINAL CLINICAL IMPRESSION(S) / ED DIAGNOSES   Final diagnoses:  Diabetic foot infection ([place])  Osteomyelitis of right foot, unspecified type ([place])     Rx / DC Orders   ED Discharge Orders     None        Note:  This document was prepared using Dragon voice recognition software and may include unintentional dictation errors.   Carrie Mew, MD 06/28/22 [id number]

## 2022-06-28 NOTE — Consult Note (Addendum)
ORTHOPAEDIC CONSULTATION  REQUESTING PHYSICIAN: Lequita Halt, MD  Chief Complaint: Ulcer with possible infection right foot  HPI: Joy Tapia is a 82 y.o. female who complains of ulceration with redness and possible infection to her right foot.  Was seen in the outpatient clinic and x-ray was concerning for possible osteomyelitis to the plantar aspect of the great toe joint at the sesamoid region.  Also had a noted deep probing ulcer at that time.  Patient deferred admission and evaluation at that time and was placed on oral antibiotics.  Presents to the ER today for evaluation.  No worsening concerns at this point.  Patient presents with a family member who provides and assist with the review of systems and history.  There is a concern for compliance as far as taking the antibiotics.  Denies any recent fever or chills.  No past medical history on file.  Social History   Socioeconomic History   Marital status: Married    Spouse name: Not on file   Number of children: Not on file   Years of education: Not on file   Highest education level: Not on file  Occupational History   Not on file  Tobacco Use   Smoking status: Not on file   Smokeless tobacco: Not on file  Substance and Sexual Activity   Alcohol use: Not on file   Drug use: Not on file   Sexual activity: Not on file  Other Topics Concern   Not on file  Social History Narrative   Not on file   Social Determinants of Health   Financial Resource Strain: Not on file  Food Insecurity: Not on file  Transportation Needs: Not on file  Physical Activity: Not on file  Stress: Not on file  Social Connections: Not on file   No family history on file. Allergies  Allergen Reactions   Azithromycin Other (See Comments)    Pt states burns her insides.   Prior to Admission medications   Not on File   MR FOOT RIGHT WO CONTRAST  Result Date: 06/28/2022 CLINICAL DATA:  Diabetic foot swelling, ulcer, clinical concern for  osteomyelitis EXAM: MRI OF THE RIGHT FOREFOOT WITHOUT CONTRAST TECHNIQUE: Multiplanar, multisequence MR imaging of the right forefoot was performed. No intravenous contrast was administered. COMPARISON:  Radiographs 06/28/2022 FINDINGS: Bones/Joint/Cartilage Extensive marrow edema in a pattern characteristic for acute osteomyelitis involving the phalanges of the great toe as well as the first metatarsal and first digit sesamoids. No other bony involvement in the forefoot. Effusion of the first MTP joint, probable septic joint. Ligaments Lisfranc ligament intact. Muscles and Tendons Tendinopathy, infectious tenosynovitis, and possibly distal tearing of the flexor hallucis longus tendon. Hyper extended great toe. Atrophic forefoot musculature. Soft tissues Substantial soft tissue edema in the great toe and around the distal first metatarsal likely reflecting infection/cellulitis. IMPRESSION: 1. Extensive marrow edema in a pattern characteristic for acute osteomyelitis involving the phalanges of the great toe as well as the first metatarsal and first digit sesamoids. 2. Effusion of the first MTP joint, probable septic joint. 3. Tendinopathy, infectious tenosynovitis, and possibly distal tearing of the flexor hallucis longus tendon. 4. Substantial soft tissue edema in the great toe and around the distal first metatarsal likely reflecting infection/cellulitis. 5. Atrophic forefoot musculature. Electronically Signed   By: Van Clines M.D.   On: 06/28/2022 13:57   DG Foot Complete Right  Result Date: 06/28/2022 CLINICAL DATA:  Diabetic foot infection of the first metatarsophalangeal joint. EXAM: RIGHT  FOOT COMPLETE - 3+ VIEW COMPARISON:  MRI right foot 06/15/2008, MRI right ankle 12/22/2009 FINDINGS: Mild posterior and minimal plantar calcaneal heel spurs. Minimal dorsal talonavicular navicular-cuneiform and tarsometatarsal degenerative osteophytes. Moderate third, greater than second and fourth tarsometatarsal  joint space narrowing and subchondral sclerosis. There is moderate soft tissue swelling about the great toe metatarsophalangeal joint extending through the distal aspect of the toe. Moderate swelling is seen plantar to the great toe metatarsophalangeal joint sesamoids. No definitive cortical erosion is seen. No acute fracture or dislocation. IMPRESSION: 1. Moderate soft tissue swelling about the great toe metatarsophalangeal joint and plantar to the great toe metatarsophalangeal joint sesamoids. No definitive cortical erosion is seen to suggest osteomyelitis. 2. 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 Electronically Signed   By: Yvonne Kendall M.D.   On: 06/28/2022 10:35    Positive ROS: All other systems have been reviewed and were otherwise negative with the exception of those mentioned in the HPI and as above.  12 point ROS was performed.  Physical Exam: General: Alert and oriented.  No apparent distress.  Vascular:  Left foot:Dorsalis Pedis:  diminished Posterior Tibial:  diminished  Right foot: Dorsalis Pedis:  diminished Posterior Tibial:  diminished  Neuro:absent  Derm: Plantar aspect of the right foot shows a small amount of hyperkeratosis.  Upon debridement there was a scant very superficial 2 mm ulceration.this Does not probe past the subcutaneous tissue.  Minimal erythema at this time.  Ortho/MS: Slight hammertoe contracture of the right great toe.  No instability with range of motion to the great toe.  No fluctuance with palpation or range of motion of the great toe joint.  I personally reviewed the x-rays today.  No obvious erosive changes.  The tibial sesamoid has an area of concern for possible fracture versus erosive change.  Assessment: Neuropathic ulcer right first MTPJ.  Concern for possible osteomyelitis or septic joint  Plan: I evaluated the patient today.  Erythema is fairly minimal at this time.  There is minimal to no edema.   Debridement of the superficial nonviable tissue revealed only a small 2 mm ulceration.  Did not probe deep.  X-rays in the outpatient clinic were concerning for possible osteomyelitis of the sesamoid region.  Given this concern would recommend MRI to further evaluate this area.  If no signs of osteomyelitis or sepsis of the joint would recommend just oral antibiotics and outpatient follow-up.  If there is concern for infection deep in the area may need surgical debridement.  I have discussed this patient with the attending physician in the ER.  MRI has been ordered.  Addendum: MRI results showed septic joint.  Concern for osteomyelitis to the phalanx of the great toe and metatarsal.  The sesamoid looks to be infected as well.  Based on x-rays old send clinically the bones to the metatarsal and proximal phalanx I believe are stable at this point.  Will have to further evaluate this intraoperatively but I do plan to perform I&D with likely tibial sesamoid removal.  If there is concern of infection of the metatarsal head or base of the proximal phalanx we can always consider amputation or partial first ray.  Can always consider bone biopsy intraoperatively if there is a question.  I discussed this case in detail with the patient as well as the patient's friend.  She has given consent for I&D with sesamoid removal and possible amputation if needed to the great toe.  Will plan on surgery in  the morning.  Elesa Hacker, DPM Cell [phone]   06/28/2022 3:16 PM

## 2022-06-28 NOTE — Plan of Care (Signed)

## 2022-06-28 NOTE — Consult Note (Signed)
PHARMACY -  BRIEF ANTIBIOTIC NOTE   Pharmacy has received consult(s) for Vancomycin and Cefepime from an ED provider.  The patient's profile has been reviewed for ht/wt/allergies/indication/available labs.    One time order(s) placed for Vancomycin 1000 mg IV and Cefepime 2 gm IV.  Further antibiotics/pharmacy consults should be ordered by admitting physician if indicated.                       Thank you, Alison Murray 06/28/2022  10:45 AM

## 2022-06-28 NOTE — ED Triage Notes (Signed)
Pt here with an infection in her right foot. Pt was instructed to come to the ED for admission.

## 2022-06-28 NOTE — H&P (Signed)
History and Physical    Joy Tapia WLN:[id number] DOB: 05/03/1941 DOA: 06/28/2022  PCP: Kirk Ruths, MD (Confirm with patient/family/NH records and if not entered, this has to be entered at [facility] point of entry) Patient coming from: Home  I have personally briefly reviewed patient's old medical records in [place]  Chief Complaint: I was sent  HPI: Joy Tapia is a 82 y.o. female with medical history significant of IIDM, HTN, CKD stage IV, gout, chronic right foot diabetic wound sent from podiatrist office for evaluation of acute osteomyelitis.  Patient has had right big toe infection since October last year and has been treated outpatient with p.o. antibiotics and outpatient podiatry debridement.  Including the most recent escalation of treatment last week patient was started on 3 combined antibiotics, however patient continued to experience sweating purulent discharge from right big toe and today, patient went back to podiatrist office to have outpatient MRI study which showed extensive acute osteomyelitis of first ray.  Denies any pain, no fever or chills.  ED Course: Patient was found to be afebrile, blood pressure slightly elevated nonhypoxic.  MRI showed extensive osteomyelitis of fourth toe, first metatarsal and MTP joint suspicious for septic arthritis as well extensive cellulitis and tendonitis.  Podiatrist recommend admitted to hospital and start IV antibiotics.  Review of Systems: As per HPI otherwise 14 point review of systems negative.   No past medical history on file.  Never smoked, no alcohol use   has no history on file for tobacco use, alcohol use, and drug use.  Allergies  Allergen Reactions   Azithromycin Other (See Comments)    Pt states burns her insides.    No family history on file.  Patient is adopted, no significant family history.   Prior to Admission medications   Not on File    Physical Exam: Vitals:   06/28/22 0954  06/28/22 0955  BP:  (!) 154/63  Pulse: 99   Resp: 18   Temp: (!) 97.5 F (36.4 C)   TempSrc: Oral   SpO2: 100%   Weight:  50.8 kg  Height:  5' (1.524 m)    Constitutional: NAD, calm, comfortable Vitals:   06/28/22 0954 06/28/22 0955  BP:  (!) 154/63  Pulse: 99   Resp: 18   Temp: (!) 97.5 F (36.4 C)   TempSrc: Oral   SpO2: 100%   Weight:  50.8 kg  Height:  5' (1.524 m)   Eyes: PERRL, lids and conjunctivae normal ENMT: Mucous membranes are moist. Posterior pharynx clear of any exudate or lesions.Normal dentition.  Neck: normal, supple, no masses, no thyromegaly Respiratory: clear to auscultation bilaterally, no wheezing, no crackles. Normal respiratory effort. No accessory muscle use.  Cardiovascular: Regular rate and rhythm, no murmurs / rubs / gallops. No extremity edema. 2+ pedal pulses. No carotid bruits.  Abdomen: no tenderness, no masses palpated. No hepatosplenomegaly. Bowel sounds positive.  Musculoskeletal: no clubbing / cyanosis. No joint deformity upper and lower extremities. Good ROM, no contractures. Normal muscle tone.  Skin: Right forefoot rash and swelling with a chronic small shallow ulcer on first metatarsal shaft location Neurologic: CN 2-12 grossly intact. Sensation intact, DTR normal. Strength 5/5 in all 4.  Psychiatric: Normal judgment and insight. Alert and oriented x 3. Normal mood.     Labs on Admission: I have personally reviewed following labs and imaging studies  CBC: Recent Labs  Lab 06/28/22 1015  WBC 10.3  NEUTROABS 7.9*  HGB 11.6*  HCT 35.3*  MCV 84.4  PLT 939*   Basic Metabolic Panel: Recent Labs  Lab 06/28/22 1015  NA 136  K 3.3*  CL 101  CO2 24  GLUCOSE 256*  BUN 40*  CREATININE 1.67*  CALCIUM 9.1   GFR: Estimated Creatinine Clearance: 19.3 mL/min (A) (by C-G formula based on SCr of 1.67 mg/dL (H)). Liver Function Tests: Recent Labs  Lab 06/28/22 1015  AST 23  ALT 18  ALKPHOS 87  BILITOT 0.8  PROT 6.8   ALBUMIN 3.5   No results for input(s): "LIPASE", "AMYLASE" in the last 168 hours. No results for input(s): "AMMONIA" in the last 168 hours. Coagulation Profile: No results for input(s): "INR", "PROTIME" in the last 168 hours. Cardiac Enzymes: No results for input(s): "CKTOTAL", "CKMB", "CKMBINDEX", "TROPONINI" in the last 168 hours. BNP (last 3 results) No results for input(s): "PROBNP" in the last 8760 hours. HbA1C: No results for input(s): "HGBA1C" in the last 72 hours. CBG: No results for input(s): "GLUCAP" in the last 168 hours. Lipid Profile: No results for input(s): "CHOL", "HDL", "LDLCALC", "TRIG", "CHOLHDL", "LDLDIRECT" in the last 72 hours. Thyroid Function Tests: No results for input(s): "TSH", "T4TOTAL", "FREET4", "T3FREE", "THYROIDAB" in the last 72 hours. Anemia Panel: No results for input(s): "VITAMINB12", "FOLATE", "FERRITIN", "TIBC", "IRON", "RETICCTPCT" in the last 72 hours. Urine analysis: No results found for: "COLORURINE", "APPEARANCEUR", "LABSPEC", "PHURINE", "GLUCOSEU", "HGBUR", "BILIRUBINUR", "KETONESUR", "PROTEINUR", "UROBILINOGEN", "NITRITE", "LEUKOCYTESUR"  Radiological Exams on Admission: MR FOOT RIGHT WO CONTRAST  Result Date: 06/28/2022 CLINICAL DATA:  Diabetic foot swelling, ulcer, clinical concern for osteomyelitis EXAM: MRI OF THE RIGHT FOREFOOT WITHOUT CONTRAST TECHNIQUE: Multiplanar, multisequence MR imaging of the right forefoot was performed. No intravenous contrast was administered. COMPARISON:  Radiographs 06/28/2022 FINDINGS: Bones/Joint/Cartilage Extensive marrow edema in a pattern characteristic for acute osteomyelitis involving the phalanges of the great toe as well as the first metatarsal and first digit sesamoids. No other bony involvement in the forefoot. Effusion of the first MTP joint, probable septic joint. Ligaments Lisfranc ligament intact. Muscles and Tendons Tendinopathy, infectious tenosynovitis, and possibly distal tearing of the flexor  hallucis longus tendon. Hyper extended great toe. Atrophic forefoot musculature. Soft tissues Substantial soft tissue edema in the great toe and around the distal first metatarsal likely reflecting infection/cellulitis. IMPRESSION: 1. Extensive marrow edema in a pattern characteristic for acute osteomyelitis involving the phalanges of the great toe as well as the first metatarsal and first digit sesamoids. 2. Effusion of the first MTP joint, probable septic joint. 3. Tendinopathy, infectious tenosynovitis, and possibly distal tearing of the flexor hallucis longus tendon. 4. Substantial soft tissue edema in the great toe and around the distal first metatarsal likely reflecting infection/cellulitis. 5. Atrophic forefoot musculature. Electronically Signed   By: Van Clines M.D.   On: 06/28/2022 13:57   DG Foot Complete Right  Result Date: 06/28/2022 CLINICAL DATA:  Diabetic foot infection of the first metatarsophalangeal joint. EXAM: RIGHT FOOT COMPLETE - 3+ VIEW COMPARISON:  MRI right foot 06/15/2008, MRI right ankle 12/22/2009 FINDINGS: Mild posterior and minimal plantar calcaneal heel spurs. Minimal dorsal talonavicular navicular-cuneiform and tarsometatarsal degenerative osteophytes. Moderate third, greater than second and fourth tarsometatarsal joint space narrowing and subchondral sclerosis. There is moderate soft tissue swelling about the great toe metatarsophalangeal joint extending through the distal aspect of the toe. Moderate swelling is seen plantar to the great toe metatarsophalangeal joint sesamoids. No definitive cortical erosion is seen. No acute fracture or dislocation. IMPRESSION: 1. Moderate soft tissue swelling about the  great toe metatarsophalangeal joint and plantar to the great toe metatarsophalangeal joint sesamoids. No definitive cortical erosion is seen to suggest osteomyelitis. 2. 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 Electronically Signed    By: Yvonne Kendall M.D.   On: 06/28/2022 10:35    EKG: Independently reviewed. Sinus, no acute ST changes.  Assessment/Plan Principal Problem:   Osteomyelitis ([place])  (please populate well all problems here in Problem List. (For example, if patient is on BP meds at home and you resume or decide to hold them, it is a problem that needs to be her. Same for CAD, COPD, HLD and so on)  Acute right 1st ray osteomyelitis including right big toe, distal part of first metatarsal and MTP joint suspicious for septic arthritis -Failed outpatient management -Escalate antibiotics to vancomycin and Zosyn -Podiatry on board, explained to patient and her friend regarding risk of possible risk to lose the first big toe, both patient and her friend expressed understanding and agreed.  Waiting for podiatry input. -Check ABI to rule out PVD  IIDM, with hyperglycemia -Hold off glipizide -Start sliding scale and Lantus  HTN -Continue Amlodipine and ACEI  CKD stage IV -Euvolemic and Creatinine level stable, continue ACEI  Gout -Continue Urolic  DVT prophylaxis: Lovenox Code Status: Full code Family Communication: close friend at bedside Disposition Plan: Patient is sick with right foot osteomyelitis failed outpatient management, requiring IV antibiotics and patient debridement, expect more than 2 midnight hospital stay. Consults called: Podiatry Admission status: Medsurg admit  Lequita Halt MD [facility] Pager [phone] 06/28/2022, 3:08 PM

## 2022-06-29 ENCOUNTER — Inpatient Hospital Stay: Payer: PPO

## 2022-06-29 ENCOUNTER — Encounter: Payer: Self-pay | Admitting: Internal Medicine

## 2022-06-29 ENCOUNTER — Encounter
Admission: EM | Disposition: A | Discharge status: Home / Self Care | Payer: Self-pay | Source: Home / Self Care | Attending: Obstetrics and Gynecology

## 2022-06-29 ENCOUNTER — Inpatient Hospital Stay: Payer: PPO | Admitting: Certified Registered"

## 2022-06-29 ENCOUNTER — Other Ambulatory Visit: Payer: Self-pay

## 2022-06-29 DIAGNOSIS — M86271 Subacute osteomyelitis, right ankle and foot: Secondary | ICD-10-CM | POA: Diagnosis not present

## 2022-06-29 DIAGNOSIS — E1159 Type 2 diabetes mellitus with other circulatory complications: Secondary | ICD-10-CM | POA: Insufficient documentation

## 2022-06-29 DIAGNOSIS — I10 Essential (primary) hypertension: Secondary | ICD-10-CM | POA: Insufficient documentation

## 2022-06-29 DIAGNOSIS — N184 Chronic kidney disease, stage 4 (severe): Secondary | ICD-10-CM | POA: Insufficient documentation

## 2022-06-29 HISTORY — PX: INCISION AND DRAINAGE: SHX5863

## 2022-06-29 LAB — BASIC METABOLIC PANEL
Anion gap: 12 (ref 5–15)
BUN: 41 mg/dL — ABNORMAL HIGH (ref 8–23)
CO2: 19 mmol/L — ABNORMAL LOW (ref 22–32)
Calcium: 8.7 mg/dL — ABNORMAL LOW (ref 8.9–10.3)
Chloride: 106 mmol/L (ref 98–111)
Creatinine, Ser: 1.77 mg/dL — ABNORMAL HIGH (ref 0.44–1.00)
GFR, Estimated: 29 mL/min — ABNORMAL LOW (ref >60)
Glucose, Bld: 102 mg/dL — ABNORMAL HIGH (ref 70–99)
Potassium: 3.7 mmol/L (ref 3.5–5.1)
Sodium: 137 mmol/L (ref 135–145)

## 2022-06-29 LAB — CBC
HCT: 34 % — ABNORMAL LOW (ref 36.0–46.0)
Hemoglobin: 11.3 g/dL — ABNORMAL LOW (ref 12.0–15.0)
MCH: 28.2 pg (ref 26.0–34.0)
MCHC: 33.2 g/dL (ref 30.0–36.0)
MCV: 84.8 fL (ref 80.0–100.0)
Platelets: 365 10*3/uL (ref 150–400)
RBC: 4.01 MIL/uL (ref 3.87–5.11)
RDW: 15 % (ref 11.5–15.5)
WBC: 10.3 10*3/uL (ref 4.0–10.5)
nRBC: 0 % (ref 0.0–0.2)

## 2022-06-29 LAB — GLUCOSE, CAPILLARY
Glucose-Capillary: 124 mg/dL — ABNORMAL HIGH (ref 70–99)
Glucose-Capillary: 113 mg/dL — ABNORMAL HIGH (ref 70–99)
Glucose-Capillary: 260 mg/dL — ABNORMAL HIGH (ref 70–99)
Glucose-Capillary: 371 mg/dL — ABNORMAL HIGH (ref 70–99)
Glucose-Capillary: 119 mg/dL — ABNORMAL HIGH (ref 70–99)

## 2022-06-29 SURGERY — INCISION AND DRAINAGE
Anesthesia: Choice | Site: Foot | Laterality: Right

## 2022-06-29 SURGERY — INCISION AND DRAINAGE
Anesthesia: General | Laterality: Right

## 2022-06-29 MED ORDER — ONDANSETRON HCL 4 MG/2ML IJ SOLN: 4.0000 mg | Freq: Once | INTRAMUSCULAR | Status: DC | PRN

## 2022-06-29 MED ORDER — PROPOFOL 10 MG/ML IV BOLUS
INTRAVENOUS | Status: DC | PRN
  Administered 2022-06-29: 30 mg via INTRAVENOUS
  Administered 2022-06-29: 100 mg via INTRAVENOUS

## 2022-06-29 MED ORDER — DEXAMETHASONE SODIUM PHOSPHATE 10 MG/ML IJ SOLN
INTRAMUSCULAR | Status: DC | PRN
  Administered 2022-06-29: 10 mg via INTRAVENOUS

## 2022-06-29 MED ORDER — GLYCOPYRROLATE 0.2 MG/ML IJ SOLN
INTRAMUSCULAR | Status: DC | PRN
  Administered 2022-06-29: .2 mg via INTRAVENOUS

## 2022-06-29 MED ORDER — FENTANYL CITRATE (PF) 100 MCG/2ML IJ SOLN
INTRAMUSCULAR | Status: DC | PRN
  Administered 2022-06-29 (×2): 25 ug via INTRAVENOUS
  Administered 2022-06-29: 50 ug via INTRAVENOUS

## 2022-06-29 MED ORDER — PROPOFOL 500 MG/50ML IV EMUL
INTRAVENOUS | Status: DC | PRN
  Administered 2022-06-29: 75 ug/kg/min via INTRAVENOUS

## 2022-06-29 MED ORDER — DEXAMETHASONE SODIUM PHOSPHATE 10 MG/ML IJ SOLN
INTRAMUSCULAR | Status: AC
  Filled 2022-06-29: qty 1

## 2022-06-29 MED ORDER — FENTANYL CITRATE (PF) 100 MCG/2ML IJ SOLN: 25.0000 ug | INTRAMUSCULAR | Status: DC | PRN

## 2022-06-29 MED ORDER — PHENYLEPHRINE 80 MCG/ML (10ML) SYRINGE FOR IV PUSH (FOR BLOOD PRESSURE SUPPORT)
PREFILLED_SYRINGE | INTRAVENOUS | Status: AC
  Filled 2022-06-29: qty 10

## 2022-06-29 MED ORDER — INSULIN ASPART 100 UNIT/ML IJ SOLN
0.0000 [IU] | Freq: Three times a day (TID) | INTRAMUSCULAR | Status: DC
  Administered 2022-06-29: 5 [IU] via SUBCUTANEOUS
  Administered 2022-06-29: 7 [IU] via SUBCUTANEOUS
  Filled 2022-06-29 (×2): qty 1

## 2022-06-29 MED ORDER — ONDANSETRON HCL 4 MG/2ML IJ SOLN
INTRAMUSCULAR | Status: AC
  Filled 2022-06-29: qty 2

## 2022-06-29 MED ORDER — LOPERAMIDE HCL 2 MG PO CAPS
2.0000 mg | ORAL_CAPSULE | ORAL | Status: DC | PRN
  Administered 2022-06-29: 2 mg via ORAL
  Filled 2022-06-29: qty 1

## 2022-06-29 MED ORDER — CEPHALEXIN 500 MG PO CAPS
500.0000 mg | ORAL_CAPSULE | Freq: Two times a day (BID) | ORAL | Status: DC
  Administered 2022-06-29 – 2022-06-30 (×2): 500 mg via ORAL
  Filled 2022-06-29 (×2): qty 1

## 2022-06-29 MED ORDER — FENTANYL CITRATE (PF) 100 MCG/2ML IJ SOLN
INTRAMUSCULAR | Status: AC
  Filled 2022-06-29: qty 2

## 2022-06-29 MED ORDER — DOXYCYCLINE HYCLATE 100 MG PO TABS
100.0000 mg | ORAL_TABLET | Freq: Two times a day (BID) | ORAL | Status: DC
  Administered 2022-06-29 – 2022-06-30 (×2): 100 mg via ORAL
  Filled 2022-06-29 (×2): qty 1

## 2022-06-29 MED ORDER — PHENYLEPHRINE HCL (PRESSORS) 10 MG/ML IV SOLN
INTRAVENOUS | Status: DC | PRN
  Administered 2022-06-29 (×3): 80 ug via INTRAVENOUS

## 2022-06-29 MED ORDER — LIDOCAINE-EPINEPHRINE (PF) 1 %-1:200000 IJ SOLN
INTRAMUSCULAR | Status: DC | PRN
  Administered 2022-06-29: 4 mL

## 2022-06-29 MED ORDER — ONDANSETRON HCL 4 MG/2ML IJ SOLN
INTRAMUSCULAR | Status: DC | PRN
  Administered 2022-06-29: 4 mg via INTRAVENOUS

## 2022-06-29 MED ORDER — BUPIVACAINE HCL (PF) 0.5 % IJ SOLN
INTRAMUSCULAR | Status: DC | PRN
  Administered 2022-06-29: 4 mL

## 2022-06-29 MED ORDER — INSULIN ASPART 100 UNIT/ML IJ SOLN: 0.0000 [IU] | Freq: Every day | INTRAMUSCULAR | Status: DC

## 2022-06-29 MED ORDER — SODIUM CHLORIDE 0.9 % IV SOLN: INTRAVENOUS | Status: DC | PRN

## 2022-06-29 SURGICAL SUPPLY — 42 items
BLADE OSC/SAGITTAL 5.5X25 (BLADE) ×1 IMPLANT
BNDG COHESIVE 4X5 TAN STRL LF (GAUZE/BANDAGES/DRESSINGS) ×1 IMPLANT
BNDG ELASTIC 4X5.8 VLCR NS LF (GAUZE/BANDAGES/DRESSINGS) ×1 IMPLANT
BNDG ESMARCH 4 X 12 STRL LF (GAUZE/BANDAGES/DRESSINGS) ×1
BNDG ESMARCH 4X12 STRL LF (GAUZE/BANDAGES/DRESSINGS) ×1 IMPLANT
BNDG GAUZE DERMACEA FLUFF 4 (GAUZE/BANDAGES/DRESSINGS) ×1 IMPLANT
BNDG STRETCH 4X75 STRL LF (GAUZE/BANDAGES/DRESSINGS) ×1 IMPLANT
CNTNR URN SCR LID CUP LEK RST (MISCELLANEOUS) ×1 IMPLANT
CONT SPEC 4OZ STRL OR WHT (MISCELLANEOUS) ×2
CUFF TOURN SGL QUICK 12 (TOURNIQUET CUFF) IMPLANT
CUFF TOURN SGL QUICK 18X4 (TOURNIQUET CUFF) IMPLANT
DRAIN PENROSE 12X.25 LTX STRL (MISCELLANEOUS) IMPLANT
DURAPREP 26ML APPLICATOR (WOUND CARE) ×1 IMPLANT
ELECT REM PT RETURN 9FT ADLT (ELECTROSURGICAL) ×1
ELECTRODE REM PT RTRN 9FT ADLT (ELECTROSURGICAL) ×1 IMPLANT
GAUZE SPONGE 4X4 12PLY STRL (GAUZE/BANDAGES/DRESSINGS) ×2 IMPLANT
GAUZE XEROFORM 1X8 LF (GAUZE/BANDAGES/DRESSINGS) ×1 IMPLANT
GLOVE BIO SURGEON STRL SZ7.5 (GLOVE) ×1 IMPLANT
GLOVE SURG UNDER LTX SZ8 (GLOVE) ×1 IMPLANT
GOWN STRL REUS W/ TWL XL LVL3 (GOWN DISPOSABLE) ×2 IMPLANT
GOWN STRL REUS W/TWL XL LVL3 (GOWN DISPOSABLE) ×2
HANDLE YANKAUER SUCT BULB TIP (MISCELLANEOUS) IMPLANT
KIT TURNOVER KIT A (KITS) ×1 IMPLANT
LABEL OR SOLS (LABEL) IMPLANT
MANIFOLD NEPTUNE II (INSTRUMENTS) ×1 IMPLANT
NDL HYPO 25X1 1.5 SAFETY (NEEDLE) ×1 IMPLANT
NDL SAFETY ECLIP 18X1.5 (MISCELLANEOUS) ×1 IMPLANT
NEEDLE HYPO 25X1 1.5 SAFETY (NEEDLE) ×1 IMPLANT
NS IRRIG 500ML POUR BTL (IV SOLUTION) ×1 IMPLANT
PACK EXTREMITY ARMC (MISCELLANEOUS) ×1 IMPLANT
PAD ABD DERMACEA PRESS 5X9 (GAUZE/BANDAGES/DRESSINGS) ×2 IMPLANT
SOL PREP PVP 2OZ (MISCELLANEOUS)
SOLUTION PREP PVP 2OZ (MISCELLANEOUS) ×1 IMPLANT
SPONGE T-LAP 18X18 ~~LOC~~+RFID (SPONGE) ×1 IMPLANT
STOCKINETTE M/LG 89821 (MISCELLANEOUS) ×1 IMPLANT
SUT ETHILON 3-0 FS-10 30 BLK (SUTURE) ×1
SUT VIC AB 3-0 SH 27 (SUTURE) ×1
SUT VIC AB 3-0 SH 27X BRD (SUTURE) IMPLANT
SUTURE EHLN 3-0 FS-10 30 BLK (SUTURE) ×1 IMPLANT
SYR 10ML LL (SYRINGE) ×2 IMPLANT
TRAP FLUID SMOKE EVACUATOR (MISCELLANEOUS) ×1 IMPLANT
WATER STERILE IRR 500ML POUR (IV SOLUTION) ×1 IMPLANT

## 2022-06-29 NOTE — Plan of Care (Signed)
  Problem: Nutritional: Goal: Maintenance of adequate nutrition will improve Outcome: Progressing   Problem: Skin Integrity: Goal: Risk for impaired skin integrity will decrease Outcome: Progressing

## 2022-06-29 NOTE — Transfer of Care (Signed)
Immediate Anesthesia Transfer of Care Note  Patient: Joy Tapia  Procedure(s) Performed: INCISION AND DRAINAGE RIGHT FOOT (Right)  Patient Location: PACU  Anesthesia Type:General  Level of Consciousness: awake and alert   Airway & Oxygen Therapy: Patient Spontanous Breathing  Post-op Assessment: Report given to RN and Post -op Vital signs reviewed and stable  Post vital signs: Reviewed  Last Vitals:  Vitals Value Taken Time  BP 101/49 06/29/22 0834  Temp    Pulse 71 06/29/22 0836  Resp    SpO2 100 % 06/29/22 0836  Vitals shown include unvalidated device data.  Last Pain:  Vitals:   06/28/22 2247  TempSrc:   PainSc: 0-No pain         Complications: No notable events documented.

## 2022-06-29 NOTE — Evaluation (Signed)
Occupational Therapy Evaluation Patient Details Name: Joy Tapia MRN: [id number] DOB: [birth year] Today's Date: 06/29/2022   History of Present Illness 82 y.o. female with medical history significant of IIDM, HTN, CKD stage IV, gout, chronic right foot diabetic wound sent from podiatrist office for evaluation of acute osteomyelitis. Pt s/p I & D great toe MTPJ.   Clinical Impression   Upon entering the room, pt supine in bed with close friend present in room. Pt reports living at home alone and Ind without use of AD at baseline. Pt appears to have some memory deficits at baseline per friend. Pt performs bed mobility without assistance. She is able to don/doff post surgical shoe and L shoe without assistance and ambulates into bathroom for toileting needs. OT explained in detail why could happen of pt does not continue to wear post op shoe and also discouraged getting into bathtub where incision could get wet. She verbalized understanding. Pt does not need skilled OT intervention at this time. OT to complete order.      Recommendations for follow up therapy are one component of a multi-disciplinary discharge planning process, led by the attending physician.  Recommendations may be updated based on patient status, additional functional criteria and insurance authorization.   Follow Up Recommendations  No OT follow up     Assistance Recommended at Discharge PRN  Patient can return home with the following Assist for transportation    Functional Status Assessment  Patient has not had a recent decline in their functional status  Equipment Recommendations  None recommended by OT       Precautions / Restrictions Precautions Precautions: Fall Required Braces or Orthoses: Other Brace Other Brace: R post op shoe Restrictions Weight Bearing Restrictions: Yes RLE Weight Bearing: Weight bearing as tolerated Other Position/Activity Restrictions: with post op shoe donned WBAT through heel       Mobility Bed Mobility Overal bed mobility: Independent                  Transfers Overall transfer level: Modified independent                        Balance Overall balance assessment: Modified Independent                                         ADL either performed or assessed with clinical judgement   ADL Overall ADL's : Modified independent                                             Vision Patient Visual Report: No change from baseline              Pertinent Vitals/Pain Pain Assessment Pain Assessment: No/denies pain     Hand Dominance Right   Extremity/Trunk Assessment Upper Extremity Assessment Upper Extremity Assessment: Overall WFL for tasks assessed   Lower Extremity Assessment Lower Extremity Assessment: Overall WFL for tasks assessed       Communication Communication Communication: No difficulties   Cognition Arousal/Alertness: Awake/alert Behavior During Therapy: WFL for tasks assessed/performed Overall Cognitive Status: History of cognitive impairments - at baseline  General Comments: Pt is oriented to self, location, and situation. short term memory deficits per friend in room. Pt reports it is "November" and "Tuesday" but correctly states year                Home Living Family/patient expects to be discharged to:: Private residence Living Arrangements: Non-relatives/Friends Available Help at Discharge: Friend(s);Available PRN/intermittently Type of Home: House Home Access: Stairs to enter [employer] of Steps: 2 Entrance Stairs-Rails: Left Home Layout: One level     Bathroom Shower/Tub: Tub/shower unit         Home Equipment: Conservation officer, nature (2 wheels);Cane - single point;Crutches;Shower seat          Prior Functioning/Environment Prior Level of Function : Independent/Modified Independent;Driving                                  OT Goals(Current goals can be found in the care plan section) Acute Rehab OT Goals Patient Stated Goal: to go home OT Goal Formulation: With patient/family Time For Goal Achievement: 06/29/22 Potential to Achieve Goals: Fair  OT Frequency:         AM-PAC OT "6 Clicks" Daily Activity     Outcome Measure Help from another person eating meals?: None Help from another person taking care of personal grooming?: None Help from another person toileting, which includes using toliet, bedpan, or urinal?: None Help from another person bathing (including washing, rinsing, drying)?: None Help from another person to put on and taking off regular upper body clothing?: None Help from another person to put on and taking off regular lower body clothing?: None 6 Click Score: 24   End of Session Equipment Utilized During Treatment: Other (comment) (post op shoe) Nurse Communication: Mobility status  Activity Tolerance: Patient tolerated treatment well Patient left: in bed;with call bell/phone within reach;with family/visitor present                   Time: 1032-1050 OT Time Calculation (min): 18 min Charges:  OT General Charges $OT Visit: 1 Visit OT Evaluation $OT Eval Low Complexity: 1 Low OT Treatments $Self Care/Home Management : 8-22 mins  Darleen Crocker, MS, OTR/L , CBIS ascom [phone]  06/29/22, 11:00 AM

## 2022-06-29 NOTE — Progress Notes (Signed)
       CROSS COVER NOTE  NAME: Joy Tapia MRN: [id number] DOB : 08-01-1940    HPI/Events of Note   Nurse reports patient with frequent soft stools that is chronic since patient had cholecystectomy and patient requesting imodium.   Assessment and  Interventions   Assessment: Known to be chronic  issue no evidence of colitis infection  Plan: Imodium prn x 2 doses X X      Kathlene Cote NP [facility]

## 2022-06-29 NOTE — Op Note (Signed)
Operative note   Surgeon:Luca Burston Lawyer: None    Preop diagnosis: Ulcer with infection right great toe joint    Postop diagnosis: 1.  Full-thickness ulcer right plantar first MTPJ 2.  Fractured tibial sesamoid right first MTPJ    Procedure: 1.  Excision tibial sesamoid right first MTPJ    2. I&D right first MTPJ  3.  Intraoperative fluoroscopy use without assistance of radiologist    EBL: Minimal    Anesthesia:local and general.  Local consisted of a total of 8 cc of a one-to-one mixture of 0.5% bupivacaine plain and 1% lidocaine with epinephrine    Hemostasis: Lidocaine with epinephrine infiltrated along the incision site    Specimen: 1.  Culture joint fluid right first MTPJ 2.  Culture tibial sesamoid right foot 3.  Pathology tibial sesamoid osteomyelitis versus fracture right first MTPJ    Complications: None    Operative indications:Joy Tapia is an 82 y.o. that presents today for surgical intervention.  The risks/benefits/alternatives/complications have been discussed and consent has been given.    Procedure:  Patient was brought into the OR and placed on the operating table in thesupine position. After anesthesia was obtained theright lower extremity was prepped and draped in usual sterile fashion.  Attention was directed to the plantar aspect of the right first MTPJ at the ulcerative site just deep to the tibial sesamoid region.  Incision was placed.  Full-thickness dissection was carried down to the joint.  At this time the tibial sesamoid was noted.  There was noted to be an area of loose bone from the tibial sesamoid and it looked to be a small fracture to the area.  The entire sesamoid was removed from the surgical field in toto.  A portion of the bone was sent for pathology and a second portion was sent for culture.  At this point the wound was flushed.  No obvious purulent drainage or necrotic tissue was noted.  The joint capsule was then opened and the  entire first MTPJ was evaluated.  The bone of the base of the proximal phalanx and head of the first metatarsal appeared to be intact without erosive changes or signs of obvious infection.  At this time a wound culture with a culture swab was performed to the joint fluid.  This was sent off.  The wound was then flushed with copious amounts of irrigation.  At this time closure was performed just at the subcutaneous tissue with a Vicryl and the skin closed with a 3-0 nylon.  The ulcerative site was packed with quarter inch plain packing.  A bulky sterile dressing was applied.    Patient tolerated the procedure and anesthesia well.  Was transported from the OR to the PACU with all vital signs stable and vascular status intact. To be discharged per routine protocol.

## 2022-06-29 NOTE — Anesthesia Preprocedure Evaluation (Signed)
Anesthesia Evaluation  Patient identified by MRN, date of birth, ID band Patient awake    Reviewed: Allergy & Precautions, H&P , NPO status , Patient's Chart, lab work & pertinent test results, reviewed documented beta blocker date and time   History of Anesthesia Complications Negative for: history of anesthetic complications  Airway Mallampati: I  TM Distance: >3 FB Neck ROM: full    Dental  (+) Dental Advidsory Given, Caps, Missing, Teeth Intact   Pulmonary neg shortness of breath, asthma , neg sleep apnea, neg COPD, neg recent URI   Pulmonary exam normal breath sounds clear to auscultation       Cardiovascular Exercise Tolerance: Good hypertension, (-) angina (-) Past MI and (-) Cardiac Stents Normal cardiovascular exam(-) dysrhythmias (-) Valvular Problems/Murmurs Rhythm:regular Rate:Normal     Neuro/Psych negative neurological ROS  negative psych ROS   GI/Hepatic negative GI ROS, Neg liver ROS,,,  Endo/Other  diabetes    Renal/GU CRFRenal disease  negative genitourinary   Musculoskeletal   Abdominal   Peds  Hematology negative hematology ROS (+)   Anesthesia Other Findings History reviewed. No pertinent past medical history.   Reproductive/Obstetrics negative OB ROS                             Anesthesia Physical Anesthesia Plan  ASA: 3  Anesthesia Plan: General   Post-op Pain Management:    Induction: Intravenous  PONV Risk Score and Plan: 3 and Dexamethasone, Ondansetron and Treatment may vary due to age or medical condition  Airway Management Planned: LMA  Additional Equipment:   Intra-op Plan:   Post-operative Plan: Extubation in OR  Informed Consent: I have reviewed the patients History and Physical, chart, labs and discussed the procedure including the risks, benefits and alternatives for the proposed anesthesia with the patient or authorized representative who  has indicated his/her understanding and acceptance.     Dental Advisory Given  Plan Discussed with: Anesthesiologist, CRNA and Surgeon  Anesthesia Plan Comments:        Anesthesia Quick Evaluation

## 2022-06-29 NOTE — Progress Notes (Signed)
Chaplain responded to consult requesting [name] paperwork. Joy Tapia dropped off paperwork and instructed patients family member to contact us should she complete the paperwork and need it notarized on during visit. Please contact [name] if requested.

## 2022-06-29 NOTE — Anesthesia Postprocedure Evaluation (Signed)
Anesthesia Post Note  Patient: Joy Tapia  Procedure(s) Performed: INCISION AND DRAINAGE RIGHT FOOT (Right)  Patient location during evaluation: PACU Anesthesia Type: General Level of consciousness: awake and alert Pain management: pain level controlled Vital Signs Assessment: post-procedure vital signs reviewed and stable Respiratory status: spontaneous breathing, nonlabored ventilation, respiratory function stable and patient connected to nasal cannula oxygen Cardiovascular status: blood pressure returned to baseline and stable Postop Assessment: no apparent nausea or vomiting Anesthetic complications: no   No notable events documented.   Last Vitals:  Vitals:   06/29/22 0919 06/29/22 2142  BP: (!) 116/55 (!) 114/56  Pulse: 78 79  Resp: 18   Temp: 36.8 C   SpO2: 100%     Last Pain:  Vitals:   06/29/22 0919  TempSrc: Oral  PainSc:                  Martha Clan

## 2022-06-29 NOTE — Progress Notes (Addendum)
PROGRESS NOTE    Joy Tapia  GGY:[id number] DOB: 1941/01/29 DOA: 06/28/2022 PCP: Kirk Ruths, MD  Outpatient Specialists: podiatry    Brief Narrative:   From admission h and p  Joy Tapia is a 82 y.o. female with medical history significant of IIDM, HTN, CKD stage IV, gout, chronic right foot diabetic wound sent from podiatrist office for evaluation of acute osteomyelitis.   Patient has had right big toe infection since October last year and has been treated outpatient with p.o. antibiotics and outpatient podiatry debridement.  Including the most recent escalation of treatment last week patient was started on 3 combined antibiotics, however patient continued to experience sweating purulent discharge from right big toe and today, patient went back to podiatrist office to have outpatient MRI study which showed extensive acute osteomyelitis of first ray.  Denies any pain, no fever or chills.   Assessment & Plan:   Principal Problem:   Osteomyelitis ([place]) Active Problems:   Mild cognitive impairment   CKD (chronic kidney disease) stage 4, GFR 15-29 ml/min (HCC)   HTN (hypertension)  # Osteomyelitis Right first toe, s/p excision tibial sesamoid right first MTP joing and I and D of that joint today with podiatry. - f/u cultures - podiatry things margins are good, will stop IV abx (vanc/zosyn) and start keflex/doxy, plan for 1 week - pt/ot consults - f/u ABI  # CKD stage 4 Baseline gfr just under 30 which is where she's at here - monitor  # T2DM A1c 7.4 - hold home glimepiride - SSI  # HTN Bp low normal here - hold home amlodipine/benazepril - cont home statin - will review aspirin w/ patient prior to discharge. If for primary prevention would d/c given age - cont home metop  DVT prophylaxis: lovenox Code Status: full Family Communication: friend updated @ bedside. Says has no family. Will consult pastoral care for hcpoa  Level of care:  Med-Surg Status is: Inpatient Remains inpatient appropriate because: severity of illness    Consultants:  podiatry  Procedures: See above  Antimicrobials:  Vanc/zosyn    Subjective: Feels well no pain has appetite  Objective: Vitals:   06/28/22 2309 06/29/22 0837 06/29/22 0845 06/29/22 0919  BP: (!) 117/44 (!) 101/49 99/60 (!) 116/55  Pulse: 72 72  78  Resp: '18 18  18  '$ Temp: (!) 97.5 F (36.4 C) (!) 96.8 F (36 C)    TempSrc:      SpO2: 100% 99%  100%  Weight:      Height:        Intake/Output Summary (Last 24 hours) at 06/29/2022 1014 Last data filed at 06/29/2022 0831 Gross per 24 hour  Intake 400 ml  Output --  Net 400 ml   Filed Weights   06/28/22 0955  Weight: 50.8 kg    Examination:  General exam: Appears calm and comfortable  Respiratory system: Clear to auscultation. Respiratory effort normal. Cardiovascular system: S1 & S2 heard, RRR. No JVD, murmurs, rubs, gallops or clicks. No pedal edema. Gastrointestinal system: Abdomen is nondistended, soft and nontender. No organomegaly or masses felt. Normal bowel sounds heard. Central nervous system: Alert and oriented. No focal neurological deficits. Extremities: dressing right foot Skin: No rashes, lesions or ulcers Psychiatry: Judgement and insight appear normal. Mood & affect appropriate.     Data Reviewed: I have personally reviewed following labs and imaging studies  CBC: Recent Labs  Lab 06/28/22 1015 06/29/22 0601  WBC 10.3 10.3  NEUTROABS 7.9*  --  HGB 11.6* 11.3*  HCT 35.3* 34.0*  MCV 84.4 84.8  PLT 422* 109   Basic Metabolic Panel: Recent Labs  Lab 06/28/22 1015 06/29/22 0601  NA 136 137  K 3.3* 3.7  CL 101 106  CO2 24 19*  GLUCOSE 256* 102*  BUN 40* 41*  CREATININE 1.67* 1.77*  CALCIUM 9.1 8.7*   GFR: Estimated Creatinine Clearance: 18.2 mL/min (A) (by C-G formula based on SCr of 1.77 mg/dL (H)). Liver Function Tests: Recent Labs  Lab 06/28/22 1015  AST 23  ALT 18   ALKPHOS 87  BILITOT 0.8  PROT 6.8  ALBUMIN 3.5   No results for input(s): "LIPASE", "AMYLASE" in the last 168 hours. No results for input(s): "AMMONIA" in the last 168 hours. Coagulation Profile: No results for input(s): "INR", "PROTIME" in the last 168 hours. Cardiac Enzymes: No results for input(s): "CKTOTAL", "CKMB", "CKMBINDEX", "TROPONINI" in the last 168 hours. BNP (last 3 results) No results for input(s): "PROBNP" in the last 8760 hours. HbA1C: Recent Labs    06/28/22 1015  HGBA1C 7.4*   CBG: Recent Labs  Lab 06/28/22 1745 06/28/22 2153 06/29/22 0850 06/29/22 0935  GLUCAP 114* 176* 124* 113*   Lipid Profile: No results for input(s): "CHOL", "HDL", "LDLCALC", "TRIG", "CHOLHDL", "LDLDIRECT" in the last 72 hours. Thyroid Function Tests: No results for input(s): "TSH", "T4TOTAL", "FREET4", "T3FREE", "THYROIDAB" in the last 72 hours. Anemia Panel: No results for input(s): "VITAMINB12", "FOLATE", "FERRITIN", "TIBC", "IRON", "RETICCTPCT" in the last 72 hours. Urine analysis: No results found for: "COLORURINE", "APPEARANCEUR", "LABSPEC", "PHURINE", "GLUCOSEU", "HGBUR", "BILIRUBINUR", "KETONESUR", "PROTEINUR", "UROBILINOGEN", "NITRITE", "LEUKOCYTESUR" Sepsis Labs: '@LABRCNTIP'$ (procalcitonin:4,lacticidven:4)  ) Recent Results (from the past 240 hour(s))  Culture, blood (routine x 2)     Status: None (Preliminary result)   Collection Time: 06/28/22 10:16 AM   Specimen: BLOOD  Result Value Ref Range Status   Specimen Description BLOOD RIGHT ANTECUBITAL  Final   Special Requests   Final    BOTTLES DRAWN AEROBIC AND ANAEROBIC Blood Culture results may not be optimal due to an excessive volume of blood received in culture bottles   Culture   Final    NO GROWTH < 24 HOURS Performed at [facility], [address] [place], [place] [address]    Report Status PENDING  Incomplete  Culture, blood (routine x 2)     Status: None (Preliminary result)   Collection Time:  06/28/22 10:18 AM   Specimen: BLOOD  Result Value Ref Range Status   Specimen Description BLOOD BLOOD RIGHT FOREARM  Final   Special Requests   Final    BOTTLES DRAWN AEROBIC AND ANAEROBIC Blood Culture adequate volume   Culture   Final    NO GROWTH < 24 HOURS Performed at [facility], [address] [place], [place] [address]    Report Status PENDING  Incomplete  MRSA Next Gen by PCR, Nasal     Status: None   Collection Time: 06/28/22  6:01 PM   Specimen: Nasal Mucosa; Nasal Swab  Result Value Ref Range Status   MRSA by PCR Next Gen NOT DETECTED NOT DETECTED Final    Comment: (NOTE) The GeneXpert MRSA Assay (FDA approved for NASAL specimens only), is one component of a comprehensive MRSA colonization surveillance program. It is not intended to diagnose MRSA infection nor to guide or monitor treatment for MRSA infections. Test performance is not FDA approved in patients less than 21 years old. Performed at [facility], [address] [place],  [place] [address]          Radiology Studies: DG MINI C-ARM IMAGE ONLY  Result Date: 06/29/2022 There is no interpretation for this exam.  This order is for images obtained during a surgical procedure.  Please See "Surgeries" Tab for more information regarding the procedure.   MR FOOT RIGHT WO CONTRAST  Result Date: 06/28/2022 CLINICAL DATA:  Diabetic foot swelling, ulcer, clinical concern for osteomyelitis EXAM: MRI OF THE RIGHT FOREFOOT WITHOUT CONTRAST TECHNIQUE: Multiplanar, multisequence MR imaging of the right forefoot was performed. No intravenous contrast was administered. COMPARISON:  Radiographs 06/28/2022 FINDINGS: Bones/Joint/Cartilage Extensive marrow edema in a pattern characteristic for acute osteomyelitis involving the phalanges of the great toe as well as the first metatarsal and first digit sesamoids. No other bony involvement in the forefoot. Effusion of the first MTP joint, probable septic joint.  Ligaments Lisfranc ligament intact. Muscles and Tendons Tendinopathy, infectious tenosynovitis, and possibly distal tearing of the flexor hallucis longus tendon. Hyper extended great toe. Atrophic forefoot musculature. Soft tissues Substantial soft tissue edema in the great toe and around the distal first metatarsal likely reflecting infection/cellulitis. IMPRESSION: 1. Extensive marrow edema in a pattern characteristic for acute osteomyelitis involving the phalanges of the great toe as well as the first metatarsal and first digit sesamoids. 2. Effusion of the first MTP joint, probable septic joint. 3. Tendinopathy, infectious tenosynovitis, and possibly distal tearing of the flexor hallucis longus tendon. 4. Substantial soft tissue edema in the great toe and around the distal first metatarsal likely reflecting infection/cellulitis. 5. Atrophic forefoot musculature. Electronically Signed   By: Van Clines M.D.   On: 06/28/2022 13:57   DG Foot Complete Right  Result Date: 06/28/2022 CLINICAL DATA:  Diabetic foot infection of the first metatarsophalangeal joint. EXAM: RIGHT FOOT COMPLETE - 3+ VIEW COMPARISON:  MRI right foot 06/15/2008, MRI right ankle 12/22/2009 FINDINGS: Mild posterior and minimal plantar calcaneal heel spurs. Minimal dorsal talonavicular navicular-cuneiform and tarsometatarsal degenerative osteophytes. Moderate third, greater than second and fourth tarsometatarsal joint space narrowing and subchondral sclerosis. There is moderate soft tissue swelling about the great toe metatarsophalangeal joint extending through the distal aspect of the toe. Moderate swelling is seen plantar to the great toe metatarsophalangeal joint sesamoids. No definitive cortical erosion is seen. No acute fracture or dislocation. IMPRESSION: 1. Moderate soft tissue swelling about the great toe metatarsophalangeal joint and plantar to the great toe metatarsophalangeal joint sesamoids. No definitive cortical erosion is  seen to suggest osteomyelitis. 2. 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 Electronically Signed   By: Yvonne Kendall M.D.   On: 06/28/2022 10:35        Scheduled Meds:  acidophilus  2 capsule Oral TID   amLODipine  2.5 mg Oral Daily   aspirin EC  81 mg Oral Daily   enoxaparin (LOVENOX) injection  30 mg Subcutaneous Q24H   febuxostat  80 mg Oral Daily   insulin aspart  0-15 Units Subcutaneous TID WC   insulin glargine-yfgn  6 Units Subcutaneous Q2000   lisinopril  10 mg Oral Daily   metoprolol tartrate  25 mg Oral BID   pantoprazole  40 mg Oral Daily   Continuous Infusions:  piperacillin-tazobactam (ZOSYN)  IV 2.25 g (06/29/22 0949)   [START ON 06/30/2022] vancomycin       LOS: 1 day     Desma Maxim, MD [facility]   If 7PM-7AM, please contact night-coverage [email or web address] Password [email or web address] 06/29/2022, 10:14 AM

## 2022-06-29 NOTE — Anesthesia Procedure Notes (Signed)
Procedure Name: LMA Insertion Date/Time: 06/29/2022 8:00 AM  Performed by: Rolla Plate, CRNAPre-anesthesia Checklist: Patient identified, Patient being monitored, Timeout performed, Emergency Drugs available and Suction available Patient Re-evaluated:Patient Re-evaluated prior to induction Oxygen Delivery Method: Circle system utilized Preoxygenation: Pre-oxygenation with 100% oxygen Induction Type: IV induction Ventilation: Mask ventilation without difficulty LMA: LMA inserted LMA Size: 3.0 Tube type: Oral Number of attempts: 1 Placement Confirmation: positive ETCO2 and breath sounds checked- equal and bilateral Tube secured with: Tape Dental Injury: Teeth and Oropharynx as per pre-operative assessment

## 2022-06-29 NOTE — Progress Notes (Signed)
PT Cancellation Note  Patient Details Name: Joy Tapia MRN: [id number] DOB: [birth year]   Cancelled Treatment:    Reason Eval/Treat Not Completed: Other (comment). Chart reviewed. Pt in bed eating lunch back from I&D. Pt reports no issues with ambulation and has been wearing her post op shoe. She reports no concerns regarding physical therapy. No services indicated at this time. Will complete consult.   Legrande Hao 06/29/2022, 1:50 PM [name], PT, DPT, [facility] [phone]

## 2022-06-30 ENCOUNTER — Encounter: Payer: Self-pay | Admitting: Podiatry

## 2022-06-30 DIAGNOSIS — M86271 Subacute osteomyelitis, right ankle and foot: Secondary | ICD-10-CM | POA: Diagnosis not present

## 2022-06-30 LAB — BASIC METABOLIC PANEL
Anion gap: 13 (ref 5–15)
BUN: 44 mg/dL — ABNORMAL HIGH (ref 8–23)
CO2: 19 mmol/L — ABNORMAL LOW (ref 22–32)
Calcium: 8.7 mg/dL — ABNORMAL LOW (ref 8.9–10.3)
Chloride: 105 mmol/L (ref 98–111)
Creatinine, Ser: 2.18 mg/dL — ABNORMAL HIGH (ref 0.44–1.00)
GFR, Estimated: 22 mL/min — ABNORMAL LOW (ref >60)
Glucose, Bld: 160 mg/dL — ABNORMAL HIGH (ref 70–99)
Potassium: 3.4 mmol/L — ABNORMAL LOW (ref 3.5–5.1)
Sodium: 137 mmol/L (ref 135–145)

## 2022-06-30 LAB — GLUCOSE, CAPILLARY
Glucose-Capillary: 110 mg/dL — ABNORMAL HIGH (ref 70–99)
Glucose-Capillary: 107 mg/dL — ABNORMAL HIGH (ref 70–99)

## 2022-06-30 MED ORDER — DOXYCYCLINE HYCLATE 100 MG PO TABS: 100.0000 mg | ORAL_TABLET | Freq: Two times a day (BID) | ORAL | 0 refills | Status: AC

## 2022-06-30 MED ORDER — CEPHALEXIN 500 MG PO CAPS: 500.0000 mg | ORAL_CAPSULE | Freq: Two times a day (BID) | ORAL | 0 refills | Status: AC

## 2022-06-30 NOTE — Plan of Care (Signed)

## 2022-06-30 NOTE — Discharge Summary (Signed)
Joy Tapia UVO:[id number] DOB: 05-16-1941 DOA: 06/28/2022  PCP: Kirk Ruths, MD  Admit date: 06/28/2022 Discharge date: 06/30/2022  Time spent: 35 minutes  Recommendations for Outpatient Follow-up:  Podiatry f/u 1 week  F/u results of ABI (performed but results not finalized at time of discharge)   Discharge Diagnoses:  Principal Problem:   Osteomyelitis ([place]) Active Problems:   Mild cognitive impairment   CKD (chronic kidney disease) stage 4, GFR 15-29 ml/min (HCC)   HTN (hypertension)   Discharge Condition: stable  Diet recommendation: regular  Filed Weights   06/28/22 0955  Weight: 50.8 kg    History of present illness:  From admission h and p Joy Tapia is a 82 y.o. female with medical history significant of IIDM, HTN, CKD stage IV, gout, chronic right foot diabetic wound sent from podiatrist office for evaluation of acute osteomyelitis.   Patient has had right big toe infection since October last year and has been treated outpatient with p.o. antibiotics and outpatient podiatry debridement.  Including the most recent escalation of treatment last week patient was started on 3 combined antibiotics, however patient continued to experience sweating purulent discharge from right big toe and today, patient went back to podiatrist office to have outpatient MRI study which showed extensive acute osteomyelitis of first ray.  Denies any pain, no fever or chills.  Hospital Course:   # Osteomyelitis Right first toe, s/p excision tibial sesamoid right first MTP joing and I and D of that joint 2/3 with podiatry. - f/u cultures - podiatry things margins are good, have stopped IV abx (vanc/zosyn) and start keflex/doxy, plan for 1 week total abx - pt/ot consulted, no HH needs identified - f/u ABI results - podiatry f/u 1 week   # CKD stage 4 Baseline gfr just under 30 which is where she's at here   # T2DM A1c 7.4   # HTN Bp low normal here - hold home  amlodipine/benazepril for now  Procedures: See above   Consultations: podiatry  Discharge Exam: Vitals:   06/30/22 0525 06/30/22 0731  BP: (!) 122/57 (!) 128/48  Pulse: 79 64  Resp: 18 16  Temp: 97.6 F (36.4 C) 97.6 F (36.4 C)  SpO2: 100% 100%    General: NAD Cardiovascular: RRR Respiratory: CTAB Ext: right foot dressed and in post-op boot  Discharge Instructions   Discharge Instructions     Diet - low sodium heart healthy   Complete by: As directed    Discharge wound care:   Complete by: As directed    Change dressing every other day and paint with betadine   Increase activity slowly   Complete by: As directed       Allergies as of 06/30/2022       Reactions   Azithromycin Other (See Comments)   Pt states burns her insides.        Medication List     STOP taking these medications    amlodipine-benazepril 2.5-10 MG capsule Commonly known as: LOTREL   amoxicillin-clavulanate 875-125 MG tablet Commonly known as: AUGMENTIN       TAKE these medications    aspirin EC 81 MG tablet Take 81 mg by mouth daily.   cephALEXin 500 MG capsule Commonly known as: KEFLEX Take 1 capsule (500 mg total) by mouth every 12 (twelve) hours for 5 days.   cetirizine 10 MG tablet Commonly known as: ZYRTEC Take 10 mg by mouth daily.   doxycycline 100 MG tablet Commonly known as:  VIBRA-TABS Take 1 tablet (100 mg total) by mouth every 12 (twelve) hours for 5 days.   furosemide 40 MG tablet Commonly known as: LASIX Take 40 mg by mouth 2 (two) times daily.   glimepiride 2 MG tablet Commonly known as: AMARYL Take 2 mg by mouth daily with breakfast.   metoprolol succinate 50 MG 24 hr tablet Commonly known as: TOPROL-XL Take 50 mg by mouth daily.   omeprazole-sodium bicarbonate 40-1100 MG capsule Commonly known as: ZEGERID Take 1 capsule by mouth daily before breakfast.   pravastatin 20 MG tablet Commonly known as: PRAVACHOL Take 20 mg by mouth daily.                Discharge Care Instructions  (From admission, onward)           Start     Ordered   06/30/22 0000  Discharge wound care:       Comments: Change dressing every other day and paint with betadine   06/30/22 1243           Allergies  Allergen Reactions   Azithromycin Other (See Comments)    Pt states burns her insides.    Follow-up Information     Samara Deist, DPM Follow up in 1 week(s).   Specialty: Podiatry Contact information: [place] [address] [phone]                  The results of significant diagnostics from this hospitalization (including imaging, microbiology, ancillary and laboratory) are listed below for reference.    Significant Diagnostic Studies: DG MINI C-ARM IMAGE ONLY  Result Date: 06/29/2022 There is no interpretation for this exam.  This order is for images obtained during a surgical procedure.  Please See "Surgeries" Tab for more information regarding the procedure.   MR FOOT RIGHT WO CONTRAST  Result Date: 06/28/2022 CLINICAL DATA:  Diabetic foot swelling, ulcer, clinical concern for osteomyelitis EXAM: MRI OF THE RIGHT FOREFOOT WITHOUT CONTRAST TECHNIQUE: Multiplanar, multisequence MR imaging of the right forefoot was performed. No intravenous contrast was administered. COMPARISON:  Radiographs 06/28/2022 FINDINGS: Bones/Joint/Cartilage Extensive marrow edema in a pattern characteristic for acute osteomyelitis involving the phalanges of the great toe as well as the first metatarsal and first digit sesamoids. No other bony involvement in the forefoot. Effusion of the first MTP joint, probable septic joint. Ligaments Lisfranc ligament intact. Muscles and Tendons Tendinopathy, infectious tenosynovitis, and possibly distal tearing of the flexor hallucis longus tendon. Hyper extended great toe. Atrophic forefoot musculature. Soft tissues Substantial soft tissue edema in the great toe and around the  distal first metatarsal likely reflecting infection/cellulitis. IMPRESSION: 1. Extensive marrow edema in a pattern characteristic for acute osteomyelitis involving the phalanges of the great toe as well as the first metatarsal and first digit sesamoids. 2. Effusion of the first MTP joint, probable septic joint. 3. Tendinopathy, infectious tenosynovitis, and possibly distal tearing of the flexor hallucis longus tendon. 4. Substantial soft tissue edema in the great toe and around the distal first metatarsal likely reflecting infection/cellulitis. 5. Atrophic forefoot musculature. Electronically Signed   By: Van Clines M.D.   On: 06/28/2022 13:57   DG Foot Complete Right  Result Date: 06/28/2022 CLINICAL DATA:  Diabetic foot infection of the first metatarsophalangeal joint. EXAM: RIGHT FOOT COMPLETE - 3+ VIEW COMPARISON:  MRI right foot 06/15/2008, MRI right ankle 12/22/2009 FINDINGS: Mild posterior and minimal plantar calcaneal heel spurs. Minimal dorsal talonavicular navicular-cuneiform and tarsometatarsal degenerative osteophytes. Moderate third,  greater than second and fourth tarsometatarsal joint space narrowing and subchondral sclerosis. There is moderate soft tissue swelling about the great toe metatarsophalangeal joint extending through the distal aspect of the toe. Moderate swelling is seen plantar to the great toe metatarsophalangeal joint sesamoids. No definitive cortical erosion is seen. No acute fracture or dislocation. IMPRESSION: 1. Moderate soft tissue swelling about the great toe metatarsophalangeal joint and plantar to the great toe metatarsophalangeal joint sesamoids. No definitive cortical erosion is seen to suggest osteomyelitis. 2. 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 Electronically Signed   By: Yvonne Kendall M.D.   On: 06/28/2022 10:35    Microbiology: Recent Results (from the past 240 hour(s))  Culture, blood (routine x 2)     Status: None  (Preliminary result)   Collection Time: 06/28/22 10:16 AM   Specimen: BLOOD  Result Value Ref Range Status   Specimen Description BLOOD RIGHT ANTECUBITAL  Final   Special Requests   Final    BOTTLES DRAWN AEROBIC AND ANAEROBIC Blood Culture results may not be optimal due to an excessive volume of blood received in culture bottles   Culture   Final    NO GROWTH 2 DAYS Performed at [facility], [address] [place], [place] [address]    Report Status PENDING  Incomplete  Culture, blood (routine x 2)     Status: None (Preliminary result)   Collection Time: 06/28/22 10:18 AM   Specimen: BLOOD  Result Value Ref Range Status   Specimen Description BLOOD BLOOD RIGHT FOREARM  Final   Special Requests   Final    BOTTLES DRAWN AEROBIC AND ANAEROBIC Blood Culture adequate volume   Culture   Final    NO GROWTH 2 DAYS Performed at [facility], [address] [place], [place] [address]    Report Status PENDING  Incomplete  MRSA Next Gen by PCR, Nasal     Status: None   Collection Time: 06/28/22  6:01 PM   Specimen: Nasal Mucosa; Nasal Swab  Result Value Ref Range Status   MRSA by PCR Next Gen NOT DETECTED NOT DETECTED Final    Comment: (NOTE) The GeneXpert MRSA Assay (FDA approved for NASAL specimens only), is one component of a comprehensive MRSA colonization surveillance program. It is not intended to diagnose MRSA infection nor to guide or monitor treatment for MRSA infections. Test performance is not FDA approved in patients less than 51 years old. Performed at [facility], [address] [place], [place] [address]   Aerobic/Anaerobic Culture w Gram Stain (surgical/deep wound)     Status: None (Preliminary result)   Collection Time: 06/29/22  8:18 AM   Specimen: Toe, Right; Amputation  Result Value Ref Range Status   Specimen Description   Final    TOE Performed at [facility], [address], [place], [place] [address]    Special  Requests   Final    RT TOE JOINT FLUID Performed at [facility], [address] [place], [place] [address]    Gram Stain   Final    RARE WBC PRESENT, PREDOMINANTLY PMN NO ORGANISMS SEEN Performed at [facility], [place] [address], [place], [place] [address]    Culture PENDING  Incomplete   Report Status PENDING  Incomplete  Aerobic/Anaerobic Culture w Gram Stain (surgical/deep wound)     Status: None (Preliminary result)   Collection Time: 06/29/22  8:20 AM   Specimen: Toe, Right; Amputation  Result Value  Ref Range Status   Specimen Description   Final    TOE Performed at [facility], [address] [place], [place] [address]    Special Requests   Final    RT TOE BONE Performed at [facility], [address] [place], [place] [address]    Gram Stain   Final    NO ORGANISMS SEEN NO WBC SEEN Performed at [facility], [place] [address], [place], [place] [address]    Culture PENDING  Incomplete   Report Status PENDING  Incomplete     Labs: Basic Metabolic Panel: Recent Labs  Lab 06/28/22 1015 06/29/22 0601 06/30/22 0530  NA 136 137 137  K 3.3* 3.7 3.4*  CL 101 106 105  CO2 24 19* 19*  GLUCOSE 256* 102* 160*  BUN 40* 41* 44*  CREATININE 1.67* 1.77* 2.18*  CALCIUM 9.1 8.7* 8.7*   Liver Function Tests: Recent Labs  Lab 06/28/22 1015  AST 23  ALT 18  ALKPHOS 87  BILITOT 0.8  PROT 6.8  ALBUMIN 3.5   No results for input(s): "LIPASE", "AMYLASE" in the last 168 hours. No results for input(s): "AMMONIA" in the last 168 hours. CBC: Recent Labs  Lab 06/28/22 1015 06/29/22 0601  WBC 10.3 10.3  NEUTROABS 7.9*  --   HGB 11.6* 11.3*  HCT 35.3* 34.0*  MCV 84.4 84.8  PLT 422* 365   Cardiac Enzymes: No results for input(s): "CKTOTAL", "CKMB", "CKMBINDEX", "TROPONINI" in the last 168 hours. BNP: BNP (last 3 results) No results for input(s): "BNP" in the last 8760 hours.  ProBNP (last 3 results) No results for  input(s): "PROBNP" in the last 8760 hours.  CBG: Recent Labs  Lab 06/29/22 1158 06/29/22 1716 06/29/22 2121 06/30/22 0755 06/30/22 1159  GLUCAP 260* 371* 119* 110* 107*       Signed:  Desma Maxim MD.  [facility] 06/30/2022, 12:44 PM

## 2022-06-30 NOTE — Discharge Instructions (Signed)
Home dressing changes: Change dressing 2-3 times a week at home.  Paint incision site with Betadine.  Apply bulky 4 x 4 gauze dressing and gauze wrap.  Cover with Ace wrap.  Keep dressing clean dry and intact otherwise.  Use walker and postop shoe while ambulating.

## 2022-06-30 NOTE — Evaluation (Signed)
Physical Therapy Evaluation Patient Details Name: Joy Tapia MRN: [id number] DOB: 1941-05-03 Today's Date: 06/30/2022  History of Present Illness  82 y.o. female with medical history significant of IIDM, HTN, CKD stage IV, gout, chronic right foot diabetic wound sent from podiatrist office for evaluation of acute osteomyelitis. Pt s/p I & D great toe MTPJ.  Clinical Impression  Pt is a pleasant 82 year old female who was admitted for osteomyelitis s/p I&D on 06/29/22. Asked to formally evaluate secondary to need for RW and WBing restrictions. Pt performs bed mobility with independence, transfers with mod I, and ambulation with supervision and use of RW. Educated on [facility] with inconsistently compliance. Very forgetful and is planning to stay with friend in room. Pt demonstrates all bed mobility/transfers/ambulation at baseline level. Pt does not require any further PT needs at this time. Pt will be dc in house and does not require follow up. RN aware. Will dc current orders.       Recommendations for follow up therapy are one component of a multi-disciplinary discharge planning process, led by the attending physician.  Recommendations may be updated based on patient status, additional functional criteria and insurance authorization.  Follow Up Recommendations No PT follow up      Assistance Recommended at Discharge Frequent or constant Supervision/Assistance  Patient can return home with the following  A little help with walking and/or transfers;Assist for transportation    Equipment Recommendations None recommended by PT  Recommendations for Other Services       Functional Status Assessment Patient has not had a recent decline in their functional status     Precautions / Restrictions Precautions Precautions: Fall Required Braces or Orthoses: Other Brace Other Brace: R post op shoe Restrictions Weight Bearing Restrictions: Yes RLE Weight Bearing: Partial weight bearing Other  Position/Activity Restrictions: with post op shoe donned      Mobility  Bed Mobility Overal bed mobility: Independent             General bed mobility comments: safe technique    Transfers Overall transfer level: Modified independent Equipment used: Rolling walker (2 wheels)               General transfer comment: upright posture. Educated on [employer] precautions. Shoe donned for all mobility    Ambulation/Gait Ambulation/Gait assistance: Supervision Gait Distance (Feet): 120 Feet Assistive device: Rolling walker (2 wheels) Gait Pattern/deviations: Step-through pattern       General Gait Details: educated on correct technique with RW including leading with R foot and PWB through RW. Poor follow through and pt often forgets to put weight on B UE. Needs frequent reminders  Stairs            Wheelchair Mobility    Modified Rankin (Stroke Patients Only)       Balance Overall balance assessment: Modified Independent                                           Pertinent Vitals/Pain Pain Assessment Pain Assessment: No/denies pain    Home Living Family/patient expects to be discharged to:: Private residence Living Arrangements: Alone Available Help at Discharge: Friend(s);Available 24 hours/day Type of Home: House Home Access: Level entry       Home Layout: One level Home Equipment: Conservation officer, nature (2 wheels);Cane - single point;Crutches;Shower seat Additional Comments: per patient friend- she will be discharging to  her home, set up listed above    Prior Function Prior Level of Function : Independent/Modified Independent;Driving             Mobility Comments: indep with all mobility. Per friend- pt may be hoarder       Hand Dominance        Extremity/Trunk Assessment   Upper Extremity Assessment Upper Extremity Assessment: Overall WFL for tasks assessed    Lower Extremity Assessment Lower Extremity Assessment:  Overall WFL for tasks assessed       Communication   Communication: No difficulties  Cognition Arousal/Alertness: Awake/alert Behavior During Therapy: WFL for tasks assessed/performed Overall Cognitive Status: History of cognitive impairments - at baseline                                 General Comments: cognition at baseline, impulsive        General Comments      Exercises Other Exercises Other Exercises: heavy education on home safety including use of RW for all mobility Other Exercises: ambulated to bathroom. Pt able to perform hygiene and transfers safely   Assessment/Plan    PT Assessment Patient does not need any further PT services  PT Problem List         PT Treatment Interventions      PT Goals (Current goals can be found in the Care Plan section)  Acute Rehab PT Goals Patient Stated Goal: to go home PT Goal Formulation: All assessment and education complete, DC therapy Time For Goal Achievement: 06/30/22 Potential to Achieve Goals: Good    Frequency       Co-evaluation               AM-PAC PT "6 Clicks" Mobility  Outcome Measure Help needed turning from your back to your side while in a flat bed without using bedrails?: None Help needed moving from lying on your back to sitting on the side of a flat bed without using bedrails?: None Help needed moving to and from a bed to a chair (including a wheelchair)?: None Help needed standing up from a chair using your arms (e.g., wheelchair or bedside chair)?: None Help needed to walk in hospital room?: None Help needed climbing 3-5 steps with a railing? : None 6 Click Score: 24    End of Session   Activity Tolerance: Patient tolerated treatment well Patient left: in bed Nurse Communication: Mobility status PT Visit Diagnosis: Unsteadiness on feet (R26.81)    Time: 1012-1027 PT Time Calculation (min) (ACUTE ONLY): 15 min   Charges:   PT Evaluation $PT Eval Low Complexity: 1  Low PT Treatments $Gait Training: 8-22 mins        Joy Tapia, PT, DPT, GCS [phone]   Joy Tapia 06/30/2022, 11:38 AM

## 2022-06-30 NOTE — Progress Notes (Signed)
Daily Progress Note   Subjective  - 1 Day Post-Op  Follow-up excision sesamoid and I&D right foot.  No complaints of pain.  Patient noticed some bleeding overnight.  Per nursing note patient was confused last night with periods of agitation.  Patient was found up walking looking for personal belongings.  Objective Vitals:   06/29/22 2142 06/30/22 0005 06/30/22 0525 06/30/22 0731  BP: (!) 114/56 (!) 114/46 (!) 122/57 (!) 128/48  Pulse: 79 70 79 64  Resp: '17 18 18 16  '$ Temp: 97.8 F (36.6 C) 99.4 F (37.4 C) 97.6 F (36.4 C) 97.6 F (36.4 C)  TempSrc: Oral     SpO2: 100% 100% 100% 100%  Weight:      Height:        Physical Exam: Incision is coapted at this time.  Packing removed.  Erythema continues to diminish.  No purulent drainage.  All cultures so far are negative.  No growth.  Laboratory CBC    Component Value Date/Time   WBC 10.3 06/29/2022 0601   HGB 11.3 (L) 06/29/2022 0601   HGB 11.4 (L) 12/19/2011 0432   HCT 34.0 (L) 06/29/2022 0601   HCT 39.2 12/02/2011 0949   PLT 365 06/29/2022 0601   PLT 198 12/18/2011 0429    BMET    Component Value Date/Time   NA 137 06/30/2022 0530   NA 141 12/18/2011 0429   K 3.4 (L) 06/30/2022 0530   K 3.6 12/18/2011 0429   CL 105 06/30/2022 0530   CL 101 12/18/2011 0429   CO2 19 (L) 06/30/2022 0530   CO2 30 12/18/2011 0429   GLUCOSE 160 (H) 06/30/2022 0530   GLUCOSE 111 (H) 12/18/2011 0429   BUN 44 (H) 06/30/2022 0530   BUN 22 (H) 12/18/2011 0429   CREATININE 2.18 (H) 06/30/2022 0530   CREATININE 1.30 12/18/2011 0429   CALCIUM 8.7 (L) 06/30/2022 0530   CALCIUM 8.1 (L) 12/18/2011 0429   GFRNONAA 22 (L) 06/30/2022 0530   GFRNONAA 42 (L) 12/18/2011 0429   GFRAA 48 (L) 12/18/2011 0429    Assessment/Planning: Status post I&D right first MTPJ with sesamoid removal for possible septic joint  Clinically wound continues to improve.  Incision is coapted.  Cultures are negative. Agree with switching to oral antibiotics.  I  would recommend 5 days of antibiotics upon discharge. I discussed with friends who are helping to monitor her at home the dressing changes which would consist of 2-3 times a week dressing changes.  Paint incision with Betadine and cover with bulky padded dressing.  Otherwise keep dressing clean dry and intact. Encourage patient to use walker. I have asked PT to reevaluate as this was deferred yesterday. Patient should follow-up with me in 1 week for reevaluation. From podiatry standpoint patient stable for discharge.  Samara Deist A  06/30/2022, 8:22 AM

## 2022-06-30 NOTE — Progress Notes (Addendum)
Pt anxious and restless throughout the night, periods of agitation noted. Pt noted ambulating in halls looking for her clothes and pocket book. Requiring frequent redirection. Pt informed that we will followup this am with her friend who visited yesterday to locate personal belongings. Pt encouraged to elevated operative foot and adhere to partial weight bearing precautions, however pt non compliant.

## 2022-07-01 LAB — GLUCOSE, CAPILLARY: Glucose-Capillary: 121 mg/dL — ABNORMAL HIGH (ref 70–99)

## 2022-07-02 DIAGNOSIS — F423 Hoarding disorder: Secondary | ICD-10-CM | POA: Insufficient documentation

## 2022-07-02 LAB — SURGICAL PATHOLOGY

## 2022-07-03 LAB — CULTURE, BLOOD (ROUTINE X 2)
Culture: NO GROWTH
Culture: NO GROWTH
Special Requests: ADEQUATE

## 2022-07-04 LAB — AEROBIC/ANAEROBIC CULTURE W GRAM STAIN (SURGICAL/DEEP WOUND)
Culture: NO GROWTH
Gram Stain: NONE SEEN

## 2022-07-16 DIAGNOSIS — L97512 Non-pressure chronic ulcer of other part of right foot with fat layer exposed: Secondary | ICD-10-CM | POA: Diagnosis not present

## 2022-07-16 DIAGNOSIS — M86171 Other acute osteomyelitis, right ankle and foot: Secondary | ICD-10-CM | POA: Diagnosis not present

## 2022-07-16 DIAGNOSIS — L03115 Cellulitis of right lower limb: Secondary | ICD-10-CM | POA: Diagnosis not present

## 2022-07-25 DIAGNOSIS — L97512 Non-pressure chronic ulcer of other part of right foot with fat layer exposed: Secondary | ICD-10-CM | POA: Diagnosis not present

## 2022-07-25 DIAGNOSIS — M86171 Other acute osteomyelitis, right ankle and foot: Secondary | ICD-10-CM | POA: Diagnosis not present

## 2022-07-30 DIAGNOSIS — Z111 Encounter for screening for respiratory tuberculosis: Secondary | ICD-10-CM | POA: Diagnosis not present

## 2022-08-13 DIAGNOSIS — E1140 Type 2 diabetes mellitus with diabetic neuropathy, unspecified: Secondary | ICD-10-CM | POA: Diagnosis not present

## 2022-08-13 DIAGNOSIS — M2031 Hallux varus (acquired), right foot: Secondary | ICD-10-CM | POA: Diagnosis not present

## 2022-08-13 DIAGNOSIS — L97512 Non-pressure chronic ulcer of other part of right foot with fat layer exposed: Secondary | ICD-10-CM | POA: Diagnosis not present

## 2022-08-15 DIAGNOSIS — N184 Chronic kidney disease, stage 4 (severe): Secondary | ICD-10-CM | POA: Diagnosis not present

## 2022-08-15 DIAGNOSIS — E1169 Type 2 diabetes mellitus with other specified complication: Secondary | ICD-10-CM | POA: Diagnosis not present

## 2022-08-15 DIAGNOSIS — E785 Hyperlipidemia, unspecified: Secondary | ICD-10-CM | POA: Diagnosis not present

## 2022-08-15 DIAGNOSIS — E1122 Type 2 diabetes mellitus with diabetic chronic kidney disease: Secondary | ICD-10-CM | POA: Diagnosis not present

## 2022-08-15 DIAGNOSIS — I129 Hypertensive chronic kidney disease with stage 1 through stage 4 chronic kidney disease, or unspecified chronic kidney disease: Secondary | ICD-10-CM | POA: Diagnosis not present

## 2022-08-18 DIAGNOSIS — E1122 Type 2 diabetes mellitus with diabetic chronic kidney disease: Secondary | ICD-10-CM | POA: Diagnosis not present

## 2022-08-18 DIAGNOSIS — E7849 Other hyperlipidemia: Secondary | ICD-10-CM | POA: Diagnosis not present

## 2022-08-18 DIAGNOSIS — F423 Hoarding disorder: Secondary | ICD-10-CM | POA: Diagnosis not present

## 2022-08-18 DIAGNOSIS — Z86718 Personal history of other venous thrombosis and embolism: Secondary | ICD-10-CM | POA: Diagnosis not present

## 2022-08-18 DIAGNOSIS — E1140 Type 2 diabetes mellitus with diabetic neuropathy, unspecified: Secondary | ICD-10-CM | POA: Diagnosis not present

## 2022-08-18 DIAGNOSIS — M86171 Other acute osteomyelitis, right ankle and foot: Secondary | ICD-10-CM | POA: Diagnosis not present

## 2022-08-18 DIAGNOSIS — L97512 Non-pressure chronic ulcer of other part of right foot with fat layer exposed: Secondary | ICD-10-CM | POA: Diagnosis not present

## 2022-08-18 DIAGNOSIS — Z9181 History of falling: Secondary | ICD-10-CM | POA: Diagnosis not present

## 2022-08-18 DIAGNOSIS — Z87891 Personal history of nicotine dependence: Secondary | ICD-10-CM | POA: Diagnosis not present

## 2022-08-18 DIAGNOSIS — E538 Deficiency of other specified B group vitamins: Secondary | ICD-10-CM | POA: Diagnosis not present

## 2022-08-18 DIAGNOSIS — Z7982 Long term (current) use of aspirin: Secondary | ICD-10-CM | POA: Diagnosis not present

## 2022-08-18 DIAGNOSIS — M2031 Hallux varus (acquired), right foot: Secondary | ICD-10-CM | POA: Diagnosis not present

## 2022-08-18 DIAGNOSIS — G3184 Mild cognitive impairment, so stated: Secondary | ICD-10-CM | POA: Diagnosis not present

## 2022-08-18 DIAGNOSIS — E11621 Type 2 diabetes mellitus with foot ulcer: Secondary | ICD-10-CM | POA: Diagnosis not present

## 2022-08-18 DIAGNOSIS — M1990 Unspecified osteoarthritis, unspecified site: Secondary | ICD-10-CM | POA: Diagnosis not present

## 2022-08-18 DIAGNOSIS — M109 Gout, unspecified: Secondary | ICD-10-CM | POA: Diagnosis not present

## 2022-08-18 DIAGNOSIS — N184 Chronic kidney disease, stage 4 (severe): Secondary | ICD-10-CM | POA: Diagnosis not present

## 2022-08-18 DIAGNOSIS — M810 Age-related osteoporosis without current pathological fracture: Secondary | ICD-10-CM | POA: Diagnosis not present

## 2022-08-18 DIAGNOSIS — E1165 Type 2 diabetes mellitus with hyperglycemia: Secondary | ICD-10-CM | POA: Diagnosis not present

## 2022-08-18 DIAGNOSIS — Z96651 Presence of right artificial knee joint: Secondary | ICD-10-CM | POA: Diagnosis not present

## 2022-08-18 DIAGNOSIS — Z7984 Long term (current) use of oral hypoglycemic drugs: Secondary | ICD-10-CM | POA: Diagnosis not present

## 2022-08-18 DIAGNOSIS — E1169 Type 2 diabetes mellitus with other specified complication: Secondary | ICD-10-CM | POA: Diagnosis not present

## 2022-08-18 DIAGNOSIS — I129 Hypertensive chronic kidney disease with stage 1 through stage 4 chronic kidney disease, or unspecified chronic kidney disease: Secondary | ICD-10-CM | POA: Diagnosis not present

## 2022-08-18 DIAGNOSIS — G43909 Migraine, unspecified, not intractable, without status migrainosus: Secondary | ICD-10-CM | POA: Diagnosis not present

## 2022-08-27 DIAGNOSIS — G43909 Migraine, unspecified, not intractable, without status migrainosus: Secondary | ICD-10-CM | POA: Diagnosis not present

## 2022-08-27 DIAGNOSIS — M2031 Hallux varus (acquired), right foot: Secondary | ICD-10-CM | POA: Diagnosis not present

## 2022-08-27 DIAGNOSIS — Z86718 Personal history of other venous thrombosis and embolism: Secondary | ICD-10-CM | POA: Diagnosis not present

## 2022-08-27 DIAGNOSIS — Z96651 Presence of right artificial knee joint: Secondary | ICD-10-CM | POA: Diagnosis not present

## 2022-08-27 DIAGNOSIS — E1165 Type 2 diabetes mellitus with hyperglycemia: Secondary | ICD-10-CM | POA: Diagnosis not present

## 2022-08-27 DIAGNOSIS — I129 Hypertensive chronic kidney disease with stage 1 through stage 4 chronic kidney disease, or unspecified chronic kidney disease: Secondary | ICD-10-CM | POA: Diagnosis not present

## 2022-08-27 DIAGNOSIS — N184 Chronic kidney disease, stage 4 (severe): Secondary | ICD-10-CM | POA: Diagnosis not present

## 2022-08-27 DIAGNOSIS — E1122 Type 2 diabetes mellitus with diabetic chronic kidney disease: Secondary | ICD-10-CM | POA: Diagnosis not present

## 2022-08-27 DIAGNOSIS — E1140 Type 2 diabetes mellitus with diabetic neuropathy, unspecified: Secondary | ICD-10-CM | POA: Diagnosis not present

## 2022-08-27 DIAGNOSIS — F423 Hoarding disorder: Secondary | ICD-10-CM | POA: Diagnosis not present

## 2022-08-27 DIAGNOSIS — L97512 Non-pressure chronic ulcer of other part of right foot with fat layer exposed: Secondary | ICD-10-CM | POA: Diagnosis not present

## 2022-08-27 DIAGNOSIS — M1990 Unspecified osteoarthritis, unspecified site: Secondary | ICD-10-CM | POA: Diagnosis not present

## 2022-08-27 DIAGNOSIS — M109 Gout, unspecified: Secondary | ICD-10-CM | POA: Diagnosis not present

## 2022-08-27 DIAGNOSIS — M86171 Other acute osteomyelitis, right ankle and foot: Secondary | ICD-10-CM | POA: Diagnosis not present

## 2022-08-27 DIAGNOSIS — E1169 Type 2 diabetes mellitus with other specified complication: Secondary | ICD-10-CM | POA: Diagnosis not present

## 2022-08-27 DIAGNOSIS — E11621 Type 2 diabetes mellitus with foot ulcer: Secondary | ICD-10-CM | POA: Diagnosis not present

## 2022-08-27 DIAGNOSIS — Z7982 Long term (current) use of aspirin: Secondary | ICD-10-CM | POA: Diagnosis not present

## 2022-08-27 DIAGNOSIS — G3184 Mild cognitive impairment, so stated: Secondary | ICD-10-CM | POA: Diagnosis not present

## 2022-08-27 DIAGNOSIS — Z87891 Personal history of nicotine dependence: Secondary | ICD-10-CM | POA: Diagnosis not present

## 2022-08-27 DIAGNOSIS — M810 Age-related osteoporosis without current pathological fracture: Secondary | ICD-10-CM | POA: Diagnosis not present

## 2022-08-27 DIAGNOSIS — Z7984 Long term (current) use of oral hypoglycemic drugs: Secondary | ICD-10-CM | POA: Diagnosis not present

## 2022-08-27 DIAGNOSIS — E7849 Other hyperlipidemia: Secondary | ICD-10-CM | POA: Diagnosis not present

## 2022-08-27 DIAGNOSIS — Z9181 History of falling: Secondary | ICD-10-CM | POA: Diagnosis not present

## 2022-08-28 ENCOUNTER — Inpatient Hospital Stay
Admission: RE | Admit: 2022-08-28 | Discharge: 2022-08-28 | Disposition: A | Discharge status: Home / Self Care | Payer: PPO | Source: Ambulatory Visit

## 2022-08-28 HISTORY — DX: Unspecified osteoarthritis, unspecified site: M19.90

## 2022-08-28 HISTORY — DX: Type 2 diabetes mellitus with diabetic chronic kidney disease: E11.22

## 2022-08-28 HISTORY — DX: Patient's noncompliance with other medical treatment and regimen due to unspecified reason: Z91.199

## 2022-08-28 HISTORY — DX: Acute embolism and thrombosis of unspecified deep veins of unspecified lower extremity: I82.409

## 2022-08-28 HISTORY — DX: Essential (primary) hypertension: I10

## 2022-08-28 HISTORY — DX: Migraine, unspecified, not intractable, without status migrainosus: G43.909

## 2022-08-28 HISTORY — DX: Gout, unspecified: M10.9

## 2022-08-28 HISTORY — DX: Type 2 diabetes mellitus with diabetic chronic kidney disease: I12.9

## 2022-08-28 HISTORY — DX: Age-related osteoporosis without current pathological fracture: M81.0

## 2022-08-28 HISTORY — DX: Type 2 diabetes mellitus without complications: E11.9

## 2022-08-28 HISTORY — DX: Hyperlipidemia, unspecified: E78.5

## 2022-08-28 HISTORY — DX: Unspecified fracture of right foot, initial encounter for closed fracture: S92.901A

## 2022-08-28 HISTORY — DX: Mild cognitive impairment of uncertain or unknown etiology: G31.84

## 2022-08-28 NOTE — Patient Instructions (Signed)
Your procedure is scheduled on: Friday September 06, 2022. Report to the Registration Desk on the 1st floor of the [facility]. To find out your arrival time, please call [phone] between 1PM - 3PM on: Thursday September 05, 2022. If your arrival time is 6:00 am, do not arrive before that time as the [facility] entrance doors do not open until 6:00 am.  REMEMBER: Instructions that are not followed completely may result in serious medical risk, up to and including death; or upon the discretion of your surgeon and anesthesiologist your surgery may need to be rescheduled.  Do not eat food after midnight the night before surgery.  No gum chewing or hard candies.  You may however, drink CLEAR liquids up to 2 hours before you are scheduled to arrive for your surgery. Do not drink anything within 2 hours of your scheduled arrival time.  Clear liquids include: - water    One week prior to surgery: Stop Anti-inflammatories (NSAIDS) such as Advil, Aleve, Ibuprofen, Motrin, Naproxen, Naprosyn and Aspirin based products such as Excedrin, Goody's Powder, BC Powder. Stop ANY OVER THE COUNTER supplements until after surgery. You may however, continue to take Tylenol if needed for pain up until the day of surgery.  Continue taking all prescribed medications with the exception of the following: Stop aspirin EC 81 MG 5 days prior to your surgery.    Follow recommendations from Cardiologist or PCP regarding stopping blood thinners.  TAKE ONLY THESE MEDICATIONS THE MORNING OF SURGERY WITH A SIP OF WATER:  metoprolol succinate (TOPROL-XL) 50 MG     No Alcohol for 24 hours before or after surgery.  No Smoking including e-cigarettes for 24 hours before surgery.  No chewable tobacco products for at least 6 hours before surgery.  No nicotine patches on the day of surgery.  Do not use any "recreational" drugs for at least a week (preferably 2 weeks) before your surgery.  Please be advised that the  combination of cocaine and anesthesia may have negative outcomes, up to and including death. If you test positive for cocaine, your surgery will be cancelled.  On the morning of surgery brush your teeth with toothpaste and water, you may rinse your mouth with mouthwash if you wish. Do not swallow any toothpaste or mouthwash.  Use CHG Soap or wipes as directed on instruction sheet.  Do not wear jewelry, make-up, hairpins, clips or nail polish.  Do not wear lotions, powders, or perfumes.   Do not shave body hair from the neck down 48 hours before surgery.  Contact lenses, hearing aids and dentures may not be worn into surgery.  Do not bring valuables to the hospital. [facility] is not responsible for any missing/lost belongings or valuables.   Total Shoulder Arthroplasty:  use Benzoyl Peroxide 5% Gel as directed on instruction sheet.  Bring your C-PAP to the hospital in case you may have to spend the night.   Notify your doctor if there is any change in your medical condition (cold, fever, infection).  Wear comfortable clothing (specific to your surgery type) to the hospital.  After surgery, you can help prevent lung complications by doing breathing exercises.  Take deep breaths and cough every 1-2 hours. Your doctor may order a device called an Incentive Spirometer to help you take deep breaths. When coughing or sneezing, hold a pillow firmly against your incision with both hands. This is called "splinting." Doing this helps protect your incision. It also decreases belly discomfort.  If  you are being admitted to the hospital overnight, leave your suitcase in the car. After surgery it may be brought to your room.  In case of increased patient census, it may be necessary for you, the patient, to continue your postoperative care in the Same Day Surgery department.  If you are being discharged the day of surgery, you will not be allowed to drive home. You will need a responsible  individual to drive you home and stay with you for 24 hours after surgery.   If you are taking public transportation, you will need to have a responsible individual with you.  Please call the [facility] Dept. at [phone] if you have any questions about these instructions.  Surgery Visitation Policy:  Patients having surgery or a procedure may have two visitors.  Children under the age of 69 must have an adult with them who is not the patient.  Inpatient Visitation:    Visiting hours are 7 a.m. to 8 p.m. Up to four visitors are allowed at one time in a patient room. The visitors may rotate out with other people during the day.  One visitor age 77 or older may stay with the patient overnight and must be in the room by 8 p.m.    Preparing for Surgery with CHLORHEXIDINE GLUCONATE (CHG) Soap  Chlorhexidine Gluconate (CHG) Soap  o An antiseptic cleaner that kills germs and bonds with the skin to continue killing germs even after washing  o Used for showering the night before surgery and morning of surgery  Before surgery, you can play an important role by reducing the number of germs on your skin.  CHG (Chlorhexidine gluconate) soap is an antiseptic cleanser which kills germs and bonds with the skin to continue killing germs even after washing.  Please do not use if you have an allergy to CHG or antibacterial soaps. If your skin becomes reddened/irritated stop using the CHG.  1. Shower the NIGHT BEFORE SURGERY and the MORNING OF SURGERY with CHG soap.  2. If you choose to wash your hair, wash your hair first as usual with your normal shampoo.  3. After shampooing, rinse your hair and body thoroughly to remove the shampoo.  4. Use CHG as you would any other liquid soap. You can apply CHG directly to the skin and wash gently with a scrungie or a clean washcloth.  5. Apply the CHG soap to your body only from the neck down. Do not use on open wounds or open sores.  Avoid contact with your eyes, ears, mouth, and genitals (private parts). Wash face and genitals (private parts) with your normal soap.  6. Wash thoroughly, paying special attention to the area where your surgery will be performed.  7. Thoroughly rinse your body with warm water.  8. Do not shower/wash with your normal soap after using and rinsing off the CHG soap.  9. Pat yourself dry with a clean towel.  10. Wear clean pajamas to bed the night before surgery.  12. Place clean sheets on your bed the night of your first shower and do not sleep with pets.  13. Shower again with the CHG soap on the day of surgery prior to arriving at the hospital.  14. Do not apply any deodorants/lotions/powders.  15. Please wear clean clothes to the hospital.

## 2022-08-28 NOTE — Pre-Procedure Instructions (Signed)
No orders in EPIC, secure chat Dr. Vickki Muff, but no answer, called the surgeon's office to see if they can fax the consent and H&P note. Spoke with scheduler and she stated that surgeon will see patient on Monday 09/02/22 and do the preop assessment then will fax [place] the consent and H&P note. Not able to get a hold of patient so moved appointment to 4/9. Left a VM at patient's number to inform that PAT phone call is moved to 4/9.

## 2022-09-02 DIAGNOSIS — E785 Hyperlipidemia, unspecified: Secondary | ICD-10-CM | POA: Diagnosis not present

## 2022-09-02 DIAGNOSIS — E1165 Type 2 diabetes mellitus with hyperglycemia: Secondary | ICD-10-CM | POA: Diagnosis not present

## 2022-09-02 DIAGNOSIS — M109 Gout, unspecified: Secondary | ICD-10-CM | POA: Diagnosis not present

## 2022-09-02 DIAGNOSIS — Z87891 Personal history of nicotine dependence: Secondary | ICD-10-CM | POA: Diagnosis not present

## 2022-09-02 DIAGNOSIS — E1122 Type 2 diabetes mellitus with diabetic chronic kidney disease: Secondary | ICD-10-CM | POA: Diagnosis not present

## 2022-09-02 DIAGNOSIS — E1140 Type 2 diabetes mellitus with diabetic neuropathy, unspecified: Secondary | ICD-10-CM | POA: Diagnosis not present

## 2022-09-02 DIAGNOSIS — N184 Chronic kidney disease, stage 4 (severe): Secondary | ICD-10-CM | POA: Diagnosis not present

## 2022-09-02 DIAGNOSIS — M86171 Other acute osteomyelitis, right ankle and foot: Secondary | ICD-10-CM | POA: Diagnosis not present

## 2022-09-02 DIAGNOSIS — E1169 Type 2 diabetes mellitus with other specified complication: Secondary | ICD-10-CM | POA: Diagnosis not present

## 2022-09-02 DIAGNOSIS — G43909 Migraine, unspecified, not intractable, without status migrainosus: Secondary | ICD-10-CM | POA: Diagnosis not present

## 2022-09-02 DIAGNOSIS — M2031 Hallux varus (acquired), right foot: Secondary | ICD-10-CM | POA: Diagnosis not present

## 2022-09-02 DIAGNOSIS — E11621 Type 2 diabetes mellitus with foot ulcer: Secondary | ICD-10-CM | POA: Diagnosis not present

## 2022-09-02 DIAGNOSIS — M810 Age-related osteoporosis without current pathological fracture: Secondary | ICD-10-CM | POA: Diagnosis not present

## 2022-09-02 DIAGNOSIS — Z9181 History of falling: Secondary | ICD-10-CM | POA: Diagnosis not present

## 2022-09-02 DIAGNOSIS — E7849 Other hyperlipidemia: Secondary | ICD-10-CM | POA: Diagnosis not present

## 2022-09-02 DIAGNOSIS — Z7984 Long term (current) use of oral hypoglycemic drugs: Secondary | ICD-10-CM | POA: Diagnosis not present

## 2022-09-02 DIAGNOSIS — Z86718 Personal history of other venous thrombosis and embolism: Secondary | ICD-10-CM | POA: Diagnosis not present

## 2022-09-02 DIAGNOSIS — M1990 Unspecified osteoarthritis, unspecified site: Secondary | ICD-10-CM | POA: Diagnosis not present

## 2022-09-02 DIAGNOSIS — Z7982 Long term (current) use of aspirin: Secondary | ICD-10-CM | POA: Diagnosis not present

## 2022-09-02 DIAGNOSIS — Z91199 Patient's noncompliance with other medical treatment and regimen due to unspecified reason: Secondary | ICD-10-CM | POA: Diagnosis not present

## 2022-09-02 DIAGNOSIS — F423 Hoarding disorder: Secondary | ICD-10-CM | POA: Diagnosis not present

## 2022-09-02 DIAGNOSIS — I10 Essential (primary) hypertension: Secondary | ICD-10-CM | POA: Diagnosis not present

## 2022-09-02 DIAGNOSIS — L97512 Non-pressure chronic ulcer of other part of right foot with fat layer exposed: Secondary | ICD-10-CM | POA: Diagnosis not present

## 2022-09-02 DIAGNOSIS — G3184 Mild cognitive impairment, so stated: Secondary | ICD-10-CM | POA: Diagnosis not present

## 2022-09-02 DIAGNOSIS — I129 Hypertensive chronic kidney disease with stage 1 through stage 4 chronic kidney disease, or unspecified chronic kidney disease: Secondary | ICD-10-CM | POA: Diagnosis not present

## 2022-09-02 DIAGNOSIS — Z96651 Presence of right artificial knee joint: Secondary | ICD-10-CM | POA: Diagnosis not present

## 2022-09-03 ENCOUNTER — Encounter
Admission: RE | Admit: 2022-09-03 | Discharge: 2022-09-03 | Disposition: A | Discharge status: Home / Self Care | Payer: PPO | Source: Ambulatory Visit | Attending: Podiatry | Admitting: Podiatry

## 2022-09-03 ENCOUNTER — Other Ambulatory Visit: Payer: Self-pay | Admitting: Podiatry

## 2022-09-03 ENCOUNTER — Other Ambulatory Visit: Payer: Self-pay

## 2022-09-03 DIAGNOSIS — M79671 Pain in right foot: Secondary | ICD-10-CM | POA: Diagnosis not present

## 2022-09-03 DIAGNOSIS — L97512 Non-pressure chronic ulcer of other part of right foot with fat layer exposed: Secondary | ICD-10-CM | POA: Diagnosis not present

## 2022-09-03 DIAGNOSIS — M86171 Other acute osteomyelitis, right ankle and foot: Secondary | ICD-10-CM | POA: Diagnosis not present

## 2022-09-03 DIAGNOSIS — Z01812 Encounter for preprocedural laboratory examination: Secondary | ICD-10-CM

## 2022-09-03 HISTORY — DX: Pneumonia, unspecified organism: J18.9

## 2022-09-03 HISTORY — DX: Gastro-esophageal reflux disease without esophagitis: K21.9

## 2022-09-03 NOTE — Pre-Procedure Instructions (Signed)
PAT appointment completed with patients HCPOA, Lynden Ang , patient was part of call as well. Patient is currently doing a respite at [facility]F, [facility] in [place], and will be moving in this facility this week. I spoke to the care provider and faxed her the patients pre op orders for her procedure tht is scheduled for 09/06/22, and she is faxing me a copy of the patients MAR. Patient information has been updated in the system.

## 2022-09-03 NOTE — Patient Instructions (Addendum)
Your procedure is scheduled on: 09/06/22 - Friday Report to the Registration Desk on the 1st floor of the [facility]. To find out your arrival time, please call [phone] between 1PM - 3PM on: 09/05/22 - Thursday If your arrival time is 6:00 am, do not arrive before that time as the [facility] entrance doors do not open until 6:00 am.  REMEMBER: Instructions that are not followed completely may result in serious medical risk, up to and including death; or upon the discretion of your surgeon and anesthesiologist your surgery may need to be rescheduled.  Do not eat food after midnight the night before surgery.  No gum chewing or hard candies.  You may however, drink CLEAR liquids up to 2 hours before you are scheduled to arrive for your surgery. Do not drink anything within 2 hours of your scheduled arrival time.  Clear liquids include: - water   One week prior to surgery: Stop Anti-inflammatories (NSAIDS) such as Advil, Aleve, Ibuprofen, Motrin, Naproxen, Naprosyn and Aspirin based products such as Excedrin, Goody's Powder, BC Powder.  Stop ANY OVER THE COUNTER supplements until after surgery : MVI  You may however, continue to take Tylenol if needed for pain up until the day of surgery.   TAKE ONLY THESE MEDICATIONS THE MORNING OF SURGERY WITH A SIP OF WATER:  omeprazole - (take one the night before and one on the morning of surgery - helps to prevent nausea after surgery.) metoprolol succinate (TOPROL-XL)  febuxostat (ULORIC)    No Alcohol for 24 hours before or after surgery.  No Smoking including e-cigarettes for 24 hours before surgery.  No chewable tobacco products for at least 6 hours before surgery.  No nicotine patches on the day of surgery.  Do not use any "recreational" drugs for at least a week (preferably 2 weeks) before your surgery.  Please be advised that the combination of cocaine and anesthesia may have negative outcomes, up to and including death. If  you test positive for cocaine, your surgery will be cancelled.  On the morning of surgery brush your teeth with toothpaste and water, you may rinse your mouth with mouthwash if you wish. Do not swallow any toothpaste or mouthwash.  Do not wear jewelry, make-up, hairpins, clips or nail polish.  Do not wear lotions, powders, or perfumes.   Do not shave body hair from the neck down 48 hours before surgery.  Contact lenses, hearing aids and dentures may not be worn into surgery.  Do not bring valuables to the hospital. [facility] is not responsible for any missing/lost belongings or valuables.   Notify your doctor if there is any change in your medical condition (cold, fever, infection).  Wear comfortable clothing (specific to your surgery type) to the hospital.  After surgery, you can help prevent lung complications by doing breathing exercises.  Take deep breaths and cough every 1-2 hours. Your doctor may order a device called an Incentive Spirometer to help you take deep breaths. When coughing or sneezing, hold a pillow firmly against your incision with both hands. This is called "splinting." Doing this helps protect your incision. It also decreases belly discomfort.  If you are being admitted to the hospital overnight, leave your suitcase in the car. After surgery it may be brought to your room.  In case of increased patient census, it may be necessary for you, the patient, to continue your postoperative care in the Same Day Surgery department.  If you are being discharged the  day of surgery, you will not be allowed to drive home. You will need a responsible individual to drive you home and stay with you for 24 hours after surgery.   If you are taking public transportation, you will need to have a responsible individual with you.  Please call the Pre-admissions Testing Dept. at [phone] if you have any questions about these instructions.  Surgery Visitation Policy:  Patients  having surgery or a procedure may have two visitors.  Children under the age of 2 must have an adult with them who is not the patient.  Inpatient Visitation:    Visiting hours are 7 a.m. to 8 p.m. Up to four visitors are allowed at one time in a patient room. The visitors may rotate out with other people during the day.  One visitor age 72 or older may stay with the patient overnight and must be in the room by 8 p.m.

## 2022-09-06 ENCOUNTER — Encounter
Admission: RE | Disposition: A | Discharge status: Home / Self Care | Payer: Self-pay | Source: Home / Self Care | Attending: Podiatry

## 2022-09-06 ENCOUNTER — Other Ambulatory Visit: Payer: Self-pay

## 2022-09-06 ENCOUNTER — Encounter: Payer: Self-pay | Admitting: Podiatry

## 2022-09-06 ENCOUNTER — Ambulatory Visit
Admission: RE | Admit: 2022-09-06 | Discharge: 2022-09-06 | Disposition: A | Discharge status: Home / Self Care | Payer: PPO | Attending: Podiatry | Admitting: Podiatry

## 2022-09-06 ENCOUNTER — Ambulatory Visit: Payer: PPO | Admitting: Anesthesiology

## 2022-09-06 ENCOUNTER — Ambulatory Visit: Payer: PPO

## 2022-09-06 DIAGNOSIS — I10 Essential (primary) hypertension: Secondary | ICD-10-CM | POA: Diagnosis not present

## 2022-09-06 DIAGNOSIS — E1152 Type 2 diabetes mellitus with diabetic peripheral angiopathy with gangrene: Secondary | ICD-10-CM | POA: Diagnosis not present

## 2022-09-06 DIAGNOSIS — K219 Gastro-esophageal reflux disease without esophagitis: Secondary | ICD-10-CM | POA: Diagnosis not present

## 2022-09-06 DIAGNOSIS — L97519 Non-pressure chronic ulcer of other part of right foot with unspecified severity: Secondary | ICD-10-CM | POA: Diagnosis not present

## 2022-09-06 DIAGNOSIS — I129 Hypertensive chronic kidney disease with stage 1 through stage 4 chronic kidney disease, or unspecified chronic kidney disease: Secondary | ICD-10-CM | POA: Diagnosis not present

## 2022-09-06 DIAGNOSIS — E1122 Type 2 diabetes mellitus with diabetic chronic kidney disease: Secondary | ICD-10-CM | POA: Diagnosis not present

## 2022-09-06 DIAGNOSIS — N184 Chronic kidney disease, stage 4 (severe): Secondary | ICD-10-CM | POA: Insufficient documentation

## 2022-09-06 DIAGNOSIS — Z87891 Personal history of nicotine dependence: Secondary | ICD-10-CM | POA: Insufficient documentation

## 2022-09-06 DIAGNOSIS — Z794 Long term (current) use of insulin: Secondary | ICD-10-CM | POA: Insufficient documentation

## 2022-09-06 DIAGNOSIS — E11621 Type 2 diabetes mellitus with foot ulcer: Secondary | ICD-10-CM | POA: Insufficient documentation

## 2022-09-06 DIAGNOSIS — M868X7 Other osteomyelitis, ankle and foot: Secondary | ICD-10-CM | POA: Diagnosis not present

## 2022-09-06 DIAGNOSIS — M810 Age-related osteoporosis without current pathological fracture: Secondary | ICD-10-CM | POA: Diagnosis not present

## 2022-09-06 DIAGNOSIS — M86171 Other acute osteomyelitis, right ankle and foot: Secondary | ICD-10-CM | POA: Diagnosis not present

## 2022-09-06 DIAGNOSIS — Z86718 Personal history of other venous thrombosis and embolism: Secondary | ICD-10-CM | POA: Diagnosis not present

## 2022-09-06 DIAGNOSIS — M869 Osteomyelitis, unspecified: Secondary | ICD-10-CM | POA: Diagnosis not present

## 2022-09-06 DIAGNOSIS — Z01812 Encounter for preprocedural laboratory examination: Secondary | ICD-10-CM

## 2022-09-06 DIAGNOSIS — M109 Gout, unspecified: Secondary | ICD-10-CM | POA: Insufficient documentation

## 2022-09-06 HISTORY — PX: METATARSAL HEAD EXCISION: SHX5027

## 2022-09-06 LAB — POCT I-STAT, CHEM 8
BUN: 29 mg/dL — ABNORMAL HIGH (ref 8–23)
Calcium, Ion: 1.08 mmol/L — ABNORMAL LOW (ref 1.15–1.40)
Chloride: 102 mmol/L (ref 98–111)
Creatinine, Ser: 1.6 mg/dL — ABNORMAL HIGH (ref 0.44–1.00)
Glucose, Bld: 179 mg/dL — ABNORMAL HIGH (ref 70–99)
HCT: 34 % — ABNORMAL LOW (ref 36.0–46.0)
Hemoglobin: 11.6 g/dL — ABNORMAL LOW (ref 12.0–15.0)
Potassium: 3.6 mmol/L (ref 3.5–5.1)
Sodium: 140 mmol/L (ref 135–145)
TCO2: 27 mmol/L (ref 22–32)

## 2022-09-06 LAB — GLUCOSE, CAPILLARY: Glucose-Capillary: 157 mg/dL — ABNORMAL HIGH (ref 70–99)

## 2022-09-06 SURGERY — EXCISION, METATARSAL BONE, HEAD
Anesthesia: General | Site: Toe | Laterality: Right

## 2022-09-06 MED ORDER — SODIUM CHLORIDE 0.9 % IV SOLN: INTRAVENOUS | Status: DC

## 2022-09-06 MED ORDER — ORAL CARE MOUTH RINSE: 15.0000 mL | Freq: Once | OROMUCOSAL | Status: AC

## 2022-09-06 MED ORDER — LIDOCAINE HCL (CARDIAC) PF 100 MG/5ML IV SOSY
PREFILLED_SYRINGE | INTRAVENOUS | Status: DC | PRN
  Administered 2022-09-06: 60 mg via INTRAVENOUS

## 2022-09-06 MED ORDER — 0.9 % SODIUM CHLORIDE (POUR BTL) OPTIME
TOPICAL | Status: DC | PRN
  Administered 2022-09-06: 500 mL

## 2022-09-06 MED ORDER — CHLORHEXIDINE GLUCONATE 0.12 % MT SOLN
15.0000 mL | Freq: Once | OROMUCOSAL | Status: AC
  Administered 2022-09-06: 15 mL via OROMUCOSAL

## 2022-09-06 MED ORDER — VANCOMYCIN HCL 1000 MG IV SOLR
INTRAVENOUS | Status: DC | PRN
  Administered 2022-09-06: 1000 mg

## 2022-09-06 MED ORDER — PROPOFOL 500 MG/50ML IV EMUL
INTRAVENOUS | Status: DC | PRN
  Administered 2022-09-06: 45 ug/kg/min via INTRAVENOUS

## 2022-09-06 MED ORDER — ONDANSETRON HCL 4 MG/2ML IJ SOLN
INTRAMUSCULAR | Status: DC | PRN
  Administered 2022-09-06: 4 mg via INTRAVENOUS

## 2022-09-06 MED ORDER — LIDOCAINE HCL (PF) 1 % IJ SOLN
INTRAMUSCULAR | Status: AC
  Filled 2022-09-06: qty 30

## 2022-09-06 MED ORDER — VANCOMYCIN HCL 1000 MG IV SOLR
INTRAVENOUS | Status: AC
  Filled 2022-09-06: qty 20

## 2022-09-06 MED ORDER — FENTANYL CITRATE (PF) 100 MCG/2ML IJ SOLN
INTRAMUSCULAR | Status: DC | PRN
  Administered 2022-09-06 (×2): 25 ug via INTRAVENOUS

## 2022-09-06 MED ORDER — DOXYCYCLINE HYCLATE 100 MG PO TABS: 100.0000 mg | ORAL_TABLET | Freq: Two times a day (BID) | ORAL | 0 refills | Status: AC

## 2022-09-06 MED ORDER — OXYCODONE HCL 5 MG/5ML PO SOLN: 5.0000 mg | Freq: Once | ORAL | Status: DC | PRN

## 2022-09-06 MED ORDER — CEFAZOLIN SODIUM-DEXTROSE 2-4 GM/100ML-% IV SOLN
INTRAVENOUS | Status: AC
  Filled 2022-09-06: qty 100

## 2022-09-06 MED ORDER — ACETAMINOPHEN 10 MG/ML IV SOLN
INTRAVENOUS | Status: AC
  Filled 2022-09-06: qty 100

## 2022-09-06 MED ORDER — FENTANYL CITRATE (PF) 100 MCG/2ML IJ SOLN: 25.0000 ug | INTRAMUSCULAR | Status: DC | PRN

## 2022-09-06 MED ORDER — FENTANYL CITRATE (PF) 100 MCG/2ML IJ SOLN
INTRAMUSCULAR | Status: AC
  Filled 2022-09-06: qty 2

## 2022-09-06 MED ORDER — LIDOCAINE-EPINEPHRINE 1 %-1:100000 IJ SOLN
INTRAMUSCULAR | Status: DC | PRN
  Administered 2022-09-06: 15 mL via INTRAMUSCULAR

## 2022-09-06 MED ORDER — LIDOCAINE-EPINEPHRINE 1 %-1:100000 IJ SOLN: INTRAMUSCULAR | Status: DC | PRN

## 2022-09-06 MED ORDER — PROPOFOL 10 MG/ML IV BOLUS
INTRAVENOUS | Status: DC | PRN
  Administered 2022-09-06: 20 mg via INTRAVENOUS
  Administered 2022-09-06: 40 mg via INTRAVENOUS
  Administered 2022-09-06: 10 mg via INTRAVENOUS

## 2022-09-06 MED ORDER — ACETAMINOPHEN 10 MG/ML IV SOLN
INTRAVENOUS | Status: DC | PRN
  Administered 2022-09-06: 1000 mg via INTRAVENOUS

## 2022-09-06 MED ORDER — DEXAMETHASONE SODIUM PHOSPHATE 10 MG/ML IJ SOLN
INTRAMUSCULAR | Status: DC | PRN
  Administered 2022-09-06: 5 mg via INTRAVENOUS

## 2022-09-06 MED ORDER — LIDOCAINE-EPINEPHRINE 1 %-1:100000 IJ SOLN
INTRAMUSCULAR | Status: AC
  Filled 2022-09-06: qty 1

## 2022-09-06 MED ORDER — BUPIVACAINE HCL (PF) 0.5 % IJ SOLN
INTRAMUSCULAR | Status: AC
  Filled 2022-09-06: qty 30

## 2022-09-06 MED ORDER — OXYCODONE HCL 5 MG PO TABS: 5.0000 mg | ORAL_TABLET | Freq: Once | ORAL | Status: DC | PRN

## 2022-09-06 MED ORDER — CHLORHEXIDINE GLUCONATE 0.12 % MT SOLN
OROMUCOSAL | Status: AC
  Filled 2022-09-06: qty 15

## 2022-09-06 MED ORDER — CEFAZOLIN SODIUM-DEXTROSE 2-4 GM/100ML-% IV SOLN
2.0000 g | INTRAVENOUS | Status: AC
  Administered 2022-09-06: 2 g via INTRAVENOUS

## 2022-09-06 SURGICAL SUPPLY — 80 items
BAG COUNTER SPONGE SURGICOUNT (BAG) IMPLANT
BAG SPNG CNTER NS LX DISP (BAG)
BLADE OSC/SAGITTAL 5.5X25 (BLADE) ×2 IMPLANT
BLADE OSC/SAGITTAL MD 5.5X18 (BLADE) IMPLANT
BLADE OSC/SAGITTAL MD 9X18.5 (BLADE) ×1 IMPLANT
BLADE OSCILLATING/SAGITTAL (BLADE)
BLADE SW THK.38XMED LNG THN (BLADE) IMPLANT
BNDG CMPR 5X4 CHSV STRCH STRL (GAUZE/BANDAGES/DRESSINGS) ×2
BNDG CMPR 75X21 PLY HI ABS (MISCELLANEOUS) ×2
BNDG CMPR 75X41 PLY HI ABS (GAUZE/BANDAGES/DRESSINGS) ×2
BNDG CMPR STD VLCR NS LF 5.8X4 (GAUZE/BANDAGES/DRESSINGS) ×2
BNDG COHESIVE 4X5 TAN STRL LF (GAUZE/BANDAGES/DRESSINGS) ×2 IMPLANT
BNDG ELASTIC 4X5.8 VLCR NS LF (GAUZE/BANDAGES/DRESSINGS) ×2 IMPLANT
BNDG ESMARCH 4 X 12 STRL LF (GAUZE/BANDAGES/DRESSINGS) ×2
BNDG ESMARCH 4X12 STRL LF (GAUZE/BANDAGES/DRESSINGS) ×2 IMPLANT
BNDG GAUZE DERMACEA FLUFF 4 (GAUZE/BANDAGES/DRESSINGS) ×2 IMPLANT
BNDG GZE 12X3 1 PLY HI ABS (GAUZE/BANDAGES/DRESSINGS) ×2
BNDG GZE DERMACEA 4 6PLY (GAUZE/BANDAGES/DRESSINGS) ×2
BNDG STRETCH 4X75 STRL LF (GAUZE/BANDAGES/DRESSINGS) ×2 IMPLANT
BNDG STRETCH GAUZE 3IN X12FT (GAUZE/BANDAGES/DRESSINGS) ×2 IMPLANT
CNTNR URN SCR LID CUP LEK RST (MISCELLANEOUS) ×2 IMPLANT
CONT SPEC 4OZ STRL OR WHT (MISCELLANEOUS) ×2
CUFF TOURN SGL QUICK 12 (TOURNIQUET CUFF) IMPLANT
CUFF TOURN SGL QUICK 18X4 (TOURNIQUET CUFF) IMPLANT
DRAIN PENROSE 12X.25 LTX STRL (MISCELLANEOUS) IMPLANT
DRAPE FLUOR MINI C-ARM 54X84 (DRAPES) ×1 IMPLANT
DRAPE XRAY CASSETTE 23X24 (DRAPES) IMPLANT
DRSG MEPILEX FLEX 3X3 (GAUZE/BANDAGES/DRESSINGS) IMPLANT
DURAPREP 26ML APPLICATOR (WOUND CARE) ×2 IMPLANT
ELECT REM PT RETURN 9FT ADLT (ELECTROSURGICAL) ×2
ELECTRODE REM PT RTRN 9FT ADLT (ELECTROSURGICAL) ×2 IMPLANT
GAUZE PACKING 0.25INX5YD STRL (GAUZE/BANDAGES/DRESSINGS) IMPLANT
GAUZE SPONGE 4X4 12PLY STRL (GAUZE/BANDAGES/DRESSINGS) ×4 IMPLANT
GAUZE STRETCH 2X75IN STRL (MISCELLANEOUS) ×2 IMPLANT
GAUZE XEROFORM 1X8 LF (GAUZE/BANDAGES/DRESSINGS) ×2 IMPLANT
GLOVE BIO SURGEON STRL SZ7.5 (GLOVE) ×2 IMPLANT
GLOVE INDICATOR 8.0 STRL GRN (GLOVE) ×2 IMPLANT
GOWN STRL REUS W/ TWL LRG LVL3 (GOWN DISPOSABLE) ×2 IMPLANT
GOWN STRL REUS W/ TWL XL LVL3 (GOWN DISPOSABLE) ×4 IMPLANT
GOWN STRL REUS W/TWL LRG LVL3 (GOWN DISPOSABLE) ×2
GOWN STRL REUS W/TWL MED LVL3 (GOWN DISPOSABLE) ×4 IMPLANT
GOWN STRL REUS W/TWL XL LVL3 (GOWN DISPOSABLE) ×4
HANDLE YANKAUER SUCT BULB TIP (MISCELLANEOUS) IMPLANT
HANDPIECE VERSAJET DEBRIDEMENT (MISCELLANEOUS) IMPLANT
IV NS IRRIG 3000ML ARTHROMATIC (IV SOLUTION) IMPLANT
KIT STIMULAN RAPID CURE 5CC (Orthopedic Implant) ×1 IMPLANT
KIT TURNOVER KIT A (KITS) ×2 IMPLANT
LABEL OR SOLS (LABEL) ×2 IMPLANT
MANIFOLD NEPTUNE II (INSTRUMENTS) ×2 IMPLANT
NDL FILTER BLUNT 18X1 1/2 (NEEDLE) ×1 IMPLANT
NDL HYPO 25X1 1.5 SAFETY (NEEDLE) ×1 IMPLANT
NDL SAFETY ECLIP 18X1.5 (MISCELLANEOUS) ×2 IMPLANT
NEEDLE FILTER BLUNT 18X1 1/2 (NEEDLE) ×2 IMPLANT
NEEDLE HYPO 25X1 1.5 SAFETY (NEEDLE) ×2 IMPLANT
NS IRRIG 500ML POUR BTL (IV SOLUTION) ×2 IMPLANT
PACK EXTREMITY ARMC (MISCELLANEOUS) ×2 IMPLANT
PACKING GAUZE IODOFORM 1INX5YD (GAUZE/BANDAGES/DRESSINGS) IMPLANT
PAD ABD DERMACEA PRESS 5X9 (GAUZE/BANDAGES/DRESSINGS) ×4 IMPLANT
PULSAVAC PLUS IRRIG FAN TIP (DISPOSABLE)
RASP SM TEAR CROSS CUT (RASP) IMPLANT
SHIELD FULL FACE ANTIFOG 7M (MISCELLANEOUS) ×2 IMPLANT
SOL PREP PVP 2OZ (MISCELLANEOUS) ×2
SOLUTION PREP PVP 2OZ (MISCELLANEOUS) ×2 IMPLANT
SPONGE T-LAP 18X18 ~~LOC~~+RFID (SPONGE) ×2 IMPLANT
STOCKINETTE IMPERVIOUS 9X36 MD (GAUZE/BANDAGES/DRESSINGS) ×2 IMPLANT
STOCKINETTE M/LG 89821 (MISCELLANEOUS) ×2 IMPLANT
SUT ETHILON 2 0 FS 18 (SUTURE) ×4 IMPLANT
SUT ETHILON 3-0 FS-10 30 BLK (SUTURE) ×2
SUT ETHILON 4-0 (SUTURE) ×2
SUT ETHILON 4-0 FS2 18XMFL BLK (SUTURE) ×2
SUT VIC AB 3-0 SH 27 (SUTURE) ×2
SUT VIC AB 3-0 SH 27X BRD (SUTURE) ×2 IMPLANT
SUT VIC AB 4-0 FS2 27 (SUTURE) ×2 IMPLANT
SUTURE EHLN 3-0 FS-10 30 BLK (SUTURE) ×2 IMPLANT
SUTURE ETHLN 4-0 FS2 18XMF BLK (SUTURE) ×2 IMPLANT
SWAB CULTURE AMIES ANAERIB BLU (MISCELLANEOUS) IMPLANT
SYR 10ML LL (SYRINGE) ×4 IMPLANT
TIP FAN IRRIG PULSAVAC PLUS (DISPOSABLE) IMPLANT
TRAP FLUID SMOKE EVACUATOR (MISCELLANEOUS) ×2 IMPLANT
WATER STERILE IRR 500ML POUR (IV SOLUTION) ×1 IMPLANT
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
## 2022-09-06 NOTE — Anesthesia Postprocedure Evaluation (Signed)
Anesthesia Post Note  Patient: Joy Tapia  Procedure(s) Performed: METATARSAL HEAD EXCISION (Right: Toe)  Patient location during evaluation: PACU Anesthesia Type: General Level of consciousness: awake and alert Pain management: pain level controlled Vital Signs Assessment: post-procedure vital signs reviewed and stable Respiratory status: spontaneous breathing, nonlabored ventilation, respiratory function stable and patient connected to nasal cannula oxygen Cardiovascular status: blood pressure returned to baseline and stable Postop Assessment: no apparent nausea or vomiting Anesthetic complications: no   No notable events documented.   Last Vitals:  Vitals:   09/06/22 1104 09/06/22 1109  BP: (!) 163/55 (!) 152/62  Pulse: 72   Resp: 17   Temp: (!) 36.2 C   SpO2: 100%     Last Pain:  Vitals:   09/06/22 1104  TempSrc: Temporal  PainSc: 0-No pain                 Cleda Mccreedy Macil Crady

## 2022-09-06 NOTE — Transfer of Care (Signed)
Immediate Anesthesia Transfer of Care Note  Patient: Joy Tapia  Procedure(s) Performed: METATARSAL HEAD EXCISION (Right: Toe)  Patient Location: PACU  Anesthesia Type:General  Level of Consciousness: awake, alert , and oriented  Airway & Oxygen Therapy: Patient Spontanous Breathing and Patient connected to face mask oxygen  Post-op Assessment: Report given to RN and Post -op Vital signs reviewed and stable  Post vital signs: Reviewed and stable  Last Vitals:  Vitals Value Taken Time  BP 121/48 09/06/22 1000  Temp 36.7 C 09/06/22 0958  Pulse 65 09/06/22 1000  Resp 17 09/06/22 1000  SpO2 100 % 09/06/22 1000  Vitals shown include unvalidated device data.  Last Pain:  Vitals:   09/06/22 0823  TempSrc: Oral  PainSc: 0-No pain         Complications: No notable events documented.

## 2022-09-06 NOTE — Op Note (Signed)
Operative note   Surgeon:Chelsey Redondo Armed forces logistics/support/administrative officer: None    Preop diagnosis: Osteomyelitis right first metatarsophalangeal joint    Postop diagnosis: Same    Procedure: 1.  Partial first ray amputation right first metatarsal and joint 2.  Placement of Stimulan antibiotic impregnated beads 3.  Intraoperative fluoroscopy use without assistance of radiologist    EBL: Minimal    Anesthesia:local and IV sedation.  Local consisted of a one-to-one mixture of 0.5% plain bupivacaine and 1% lidocaine with epinephrine infiltrated along the incision site    Hemostasis: Lidocaine with epinephrine infiltrated along the incision site and ankle tourniquet at 200 mmHg for 16 minutes    Specimen: First ray with proximal margin inked for pathology and first metatarsal bone for culture    Complications: None    Operative indications:Joy Tapia is an 82 y.o. that presents today for surgical intervention.  The risks/benefits/alternatives/complications have been discussed and consent has been given.    Procedure:  Patient was brought into the OR and placed on the operating table in thesupine position. After anesthesia was obtained theright lower extremity was prepped and draped in usual sterile fashion.  Attention was directed to the right foot where a dorsal incision was performed.  A racquet type of incision was placed around the first MTPJ.  Full-thickness flaps were created.  At about the midshaft level osteotomy was created.  The toe and metatarsal were then removed from the surgical field.  A small secondary osteotomy was created distally to remove the proximal margin.  This was inked for pathological examination.  A small portion of the bone of the first metatarsal was sent for bone culture.  The wound was flushed with copious amounts of irrigation.  No obvious necrotic tissue or purulence was noted within the wound site.  The tourniquet was released.  All bleeders were Bovie cauterized.  The  metatarsal shaft was packed with Stimulan antibiotic impregnated calcium sulfate and antibiotic beads were then placed within the soft tissue.  The wound was then closed.  Proximal aspect was closed with a 3-0 Vicryl.  The skin was closed with a 3-0 nylon completely.  A bulky sterile dressing was applied.    Patient tolerated the procedure and anesthesia well.  Was transported from the OR to the PACU with all vital signs stable and vascular status intact. To be discharged per routine protocol.  Will follow up in approximately 1 week in the outpatient clinic.

## 2022-09-06 NOTE — H&P (Signed)
HISTORY AND PHYSICAL INTERVAL NOTE:  09/06/2022  8:30 AM  Joy Tapia  has presented today for surgery, with the diagnosis of Ulcer of right foot with layer exposed Type 2 diabetes mellitus with diabetic neuropathy, with long term current used of insulin Hallux malleus of right foot.  The various methods of treatment have been discussed with the patient.  No guarantees were given.  After consideration of risks, benefits and other options for treatment, the patient has consented to surgery.  I have reviewed the patients' chart and labs.     A history and physical examination was performed in my office.  The patient was reexamined.  There have been no changes to this history and physical examination.  Joy Tapia A

## 2022-09-06 NOTE — Anesthesia Procedure Notes (Addendum)
Date/Time: 09/06/2022 9:04 AM  Performed by: Ginger Carne, CRNAPre-anesthesia Checklist: Patient identified, Emergency Drugs available, Suction available, Patient being monitored and Timeout performed Patient Re-evaluated:Patient Re-evaluated prior to induction Oxygen Delivery Method: Simple face mask Preoxygenation: Pre-oxygenation with 100% oxygen Induction Type: IV induction

## 2022-09-06 NOTE — Discharge Instructions (Addendum)
 [facility]  POST OPERATIVE INSTRUCTIONS FOR DR. Ether Griffins AND DR. BAKER [facility] PODIATRY DEPARTMENT   Take your medication as prescribed.  Pain medication should be taken only as needed.  An antibiotic has been sent home with you today.    Keep the dressing clean, dry and intact.  It may be changed with bulky gauze dressing if breakthrough bleeding is noted.  If bandage has not been changed in 1 week please perform dressing change.  Keep your foot elevated above the heart level for the first 48 hours.  Walking to the bathroom and brief periods of walking are acceptable, unless we have instructed you to be non-weight bearing.  Pt can use a rolling knee scooter if she has stamina and balance to use.  This will allow for further NWB to her foot.  A prescription is included in her paperwork today.  Always wear your post-op shoe when walking.  Minimal ambulation is recommended.  Only WB while transferring from bed to restroom or to seated position.  Can remove post op shoe while sleeping.  Do not take a shower. Baths are permissible as long as the foot is kept out of the water.   Every hour you are awake:  Bend your knee 15 times. Flex foot 15 times Massage calf 15 times  Call [facility] [phone]) if any of the following problems occur: You develop a temperature or fever. The bandage becomes saturated with blood. Medication does not stop your pain. Injury of the foot occurs. Any symptoms of infection including redness, odor, or red streaks running from wound. AMBULATORY SURGERY  DISCHARGE INSTRUCTIONS   The drugs that you were given will stay in your system until tomorrow so for the next 24 hours you should not:  Drive an automobile Make any legal decisions Drink any alcoholic beverage   You may resume regular meals tomorrow.  Today it is better to start with liquids and gradually work up to solid foods.  You may eat  anything you prefer, but it is better to start with liquids, then soup and crackers, and gradually work up to solid foods.   Please notify your doctor immediately if you have any unusual bleeding, trouble breathing, redness and pain at the surgery site, drainage, fever, or pain not relieved by medication.    Additional Instructions:        Please contact your physician with any problems or Same Day Surgery at [phone], Monday through Friday 6 am to 4 pm, or [place] at [facility] number at [phone].

## 2022-09-09 ENCOUNTER — Encounter: Payer: Self-pay | Admitting: Podiatry

## 2022-09-09 DIAGNOSIS — E1169 Type 2 diabetes mellitus with other specified complication: Secondary | ICD-10-CM | POA: Diagnosis not present

## 2022-09-09 DIAGNOSIS — E1140 Type 2 diabetes mellitus with diabetic neuropathy, unspecified: Secondary | ICD-10-CM | POA: Diagnosis not present

## 2022-09-09 DIAGNOSIS — L97512 Non-pressure chronic ulcer of other part of right foot with fat layer exposed: Secondary | ICD-10-CM | POA: Diagnosis not present

## 2022-09-09 DIAGNOSIS — E7849 Other hyperlipidemia: Secondary | ICD-10-CM | POA: Diagnosis not present

## 2022-09-09 DIAGNOSIS — M86171 Other acute osteomyelitis, right ankle and foot: Secondary | ICD-10-CM | POA: Diagnosis not present

## 2022-09-09 DIAGNOSIS — E1165 Type 2 diabetes mellitus with hyperglycemia: Secondary | ICD-10-CM | POA: Diagnosis not present

## 2022-09-09 DIAGNOSIS — E11621 Type 2 diabetes mellitus with foot ulcer: Secondary | ICD-10-CM | POA: Diagnosis not present

## 2022-09-10 LAB — AEROBIC/ANAEROBIC CULTURE W GRAM STAIN (SURGICAL/DEEP WOUND): Gram Stain: NONE SEEN

## 2022-09-10 LAB — SURGICAL PATHOLOGY

## 2022-09-11 DIAGNOSIS — Z9181 History of falling: Secondary | ICD-10-CM | POA: Diagnosis not present

## 2022-09-11 DIAGNOSIS — M86171 Other acute osteomyelitis, right ankle and foot: Secondary | ICD-10-CM | POA: Diagnosis not present

## 2022-09-11 DIAGNOSIS — E1140 Type 2 diabetes mellitus with diabetic neuropathy, unspecified: Secondary | ICD-10-CM | POA: Diagnosis not present

## 2022-09-11 DIAGNOSIS — Z87891 Personal history of nicotine dependence: Secondary | ICD-10-CM | POA: Diagnosis not present

## 2022-09-11 DIAGNOSIS — Z7984 Long term (current) use of oral hypoglycemic drugs: Secondary | ICD-10-CM | POA: Diagnosis not present

## 2022-09-11 DIAGNOSIS — L97512 Non-pressure chronic ulcer of other part of right foot with fat layer exposed: Secondary | ICD-10-CM | POA: Diagnosis not present

## 2022-09-11 DIAGNOSIS — E1122 Type 2 diabetes mellitus with diabetic chronic kidney disease: Secondary | ICD-10-CM | POA: Diagnosis not present

## 2022-09-11 DIAGNOSIS — Z96651 Presence of right artificial knee joint: Secondary | ICD-10-CM | POA: Diagnosis not present

## 2022-09-11 DIAGNOSIS — I129 Hypertensive chronic kidney disease with stage 1 through stage 4 chronic kidney disease, or unspecified chronic kidney disease: Secondary | ICD-10-CM | POA: Diagnosis not present

## 2022-09-11 DIAGNOSIS — Z7982 Long term (current) use of aspirin: Secondary | ICD-10-CM | POA: Diagnosis not present

## 2022-09-11 DIAGNOSIS — G43909 Migraine, unspecified, not intractable, without status migrainosus: Secondary | ICD-10-CM | POA: Diagnosis not present

## 2022-09-11 DIAGNOSIS — E11621 Type 2 diabetes mellitus with foot ulcer: Secondary | ICD-10-CM | POA: Diagnosis not present

## 2022-09-11 DIAGNOSIS — F423 Hoarding disorder: Secondary | ICD-10-CM | POA: Diagnosis not present

## 2022-09-11 DIAGNOSIS — Z86718 Personal history of other venous thrombosis and embolism: Secondary | ICD-10-CM | POA: Diagnosis not present

## 2022-09-11 DIAGNOSIS — E1169 Type 2 diabetes mellitus with other specified complication: Secondary | ICD-10-CM | POA: Diagnosis not present

## 2022-09-11 DIAGNOSIS — M1990 Unspecified osteoarthritis, unspecified site: Secondary | ICD-10-CM | POA: Diagnosis not present

## 2022-09-11 DIAGNOSIS — E7849 Other hyperlipidemia: Secondary | ICD-10-CM | POA: Diagnosis not present

## 2022-09-11 DIAGNOSIS — M2031 Hallux varus (acquired), right foot: Secondary | ICD-10-CM | POA: Diagnosis not present

## 2022-09-11 DIAGNOSIS — G3184 Mild cognitive impairment, so stated: Secondary | ICD-10-CM | POA: Diagnosis not present

## 2022-09-11 DIAGNOSIS — M810 Age-related osteoporosis without current pathological fracture: Secondary | ICD-10-CM | POA: Diagnosis not present

## 2022-09-11 DIAGNOSIS — N184 Chronic kidney disease, stage 4 (severe): Secondary | ICD-10-CM | POA: Diagnosis not present

## 2022-09-11 DIAGNOSIS — E1165 Type 2 diabetes mellitus with hyperglycemia: Secondary | ICD-10-CM | POA: Diagnosis not present

## 2022-09-11 DIAGNOSIS — M109 Gout, unspecified: Secondary | ICD-10-CM | POA: Diagnosis not present

## 2022-09-11 LAB — AEROBIC/ANAEROBIC CULTURE W GRAM STAIN (SURGICAL/DEEP WOUND)

## 2022-09-12 DIAGNOSIS — L97512 Non-pressure chronic ulcer of other part of right foot with fat layer exposed: Secondary | ICD-10-CM | POA: Diagnosis not present

## 2022-09-17 DIAGNOSIS — M1990 Unspecified osteoarthritis, unspecified site: Secondary | ICD-10-CM | POA: Diagnosis not present

## 2022-09-17 DIAGNOSIS — M2042 Other hammer toe(s) (acquired), left foot: Secondary | ICD-10-CM | POA: Diagnosis not present

## 2022-09-17 DIAGNOSIS — M2031 Hallux varus (acquired), right foot: Secondary | ICD-10-CM | POA: Diagnosis not present

## 2022-09-17 DIAGNOSIS — Z7984 Long term (current) use of oral hypoglycemic drugs: Secondary | ICD-10-CM | POA: Diagnosis not present

## 2022-09-17 DIAGNOSIS — Z87891 Personal history of nicotine dependence: Secondary | ICD-10-CM | POA: Diagnosis not present

## 2022-09-17 DIAGNOSIS — M79675 Pain in left toe(s): Secondary | ICD-10-CM | POA: Diagnosis not present

## 2022-09-17 DIAGNOSIS — B351 Tinea unguium: Secondary | ICD-10-CM | POA: Diagnosis not present

## 2022-09-17 DIAGNOSIS — M109 Gout, unspecified: Secondary | ICD-10-CM | POA: Diagnosis not present

## 2022-09-17 DIAGNOSIS — E1122 Type 2 diabetes mellitus with diabetic chronic kidney disease: Secondary | ICD-10-CM | POA: Diagnosis not present

## 2022-09-17 DIAGNOSIS — E11621 Type 2 diabetes mellitus with foot ulcer: Secondary | ICD-10-CM | POA: Diagnosis not present

## 2022-09-17 DIAGNOSIS — G3184 Mild cognitive impairment, so stated: Secondary | ICD-10-CM | POA: Diagnosis not present

## 2022-09-17 DIAGNOSIS — E1169 Type 2 diabetes mellitus with other specified complication: Secondary | ICD-10-CM | POA: Diagnosis not present

## 2022-09-17 DIAGNOSIS — Z86718 Personal history of other venous thrombosis and embolism: Secondary | ICD-10-CM | POA: Diagnosis not present

## 2022-09-17 DIAGNOSIS — M810 Age-related osteoporosis without current pathological fracture: Secondary | ICD-10-CM | POA: Diagnosis not present

## 2022-09-17 DIAGNOSIS — E1140 Type 2 diabetes mellitus with diabetic neuropathy, unspecified: Secondary | ICD-10-CM | POA: Diagnosis not present

## 2022-09-17 DIAGNOSIS — I129 Hypertensive chronic kidney disease with stage 1 through stage 4 chronic kidney disease, or unspecified chronic kidney disease: Secondary | ICD-10-CM | POA: Diagnosis not present

## 2022-09-17 DIAGNOSIS — Z7982 Long term (current) use of aspirin: Secondary | ICD-10-CM | POA: Diagnosis not present

## 2022-09-17 DIAGNOSIS — I739 Peripheral vascular disease, unspecified: Secondary | ICD-10-CM | POA: Diagnosis not present

## 2022-09-17 DIAGNOSIS — N184 Chronic kidney disease, stage 4 (severe): Secondary | ICD-10-CM | POA: Diagnosis not present

## 2022-09-17 DIAGNOSIS — Z96651 Presence of right artificial knee joint: Secondary | ICD-10-CM | POA: Diagnosis not present

## 2022-09-17 DIAGNOSIS — L97512 Non-pressure chronic ulcer of other part of right foot with fat layer exposed: Secondary | ICD-10-CM | POA: Diagnosis not present

## 2022-09-17 DIAGNOSIS — E1165 Type 2 diabetes mellitus with hyperglycemia: Secondary | ICD-10-CM | POA: Diagnosis not present

## 2022-09-17 DIAGNOSIS — M86171 Other acute osteomyelitis, right ankle and foot: Secondary | ICD-10-CM | POA: Diagnosis not present

## 2022-09-17 DIAGNOSIS — G43909 Migraine, unspecified, not intractable, without status migrainosus: Secondary | ICD-10-CM | POA: Diagnosis not present

## 2022-09-17 DIAGNOSIS — F423 Hoarding disorder: Secondary | ICD-10-CM | POA: Diagnosis not present

## 2022-09-17 DIAGNOSIS — E7849 Other hyperlipidemia: Secondary | ICD-10-CM | POA: Diagnosis not present

## 2022-09-17 DIAGNOSIS — Z9181 History of falling: Secondary | ICD-10-CM | POA: Diagnosis not present

## 2022-09-25 ENCOUNTER — Other Ambulatory Visit: Payer: Self-pay | Admitting: Podiatry

## 2022-09-27 ENCOUNTER — Encounter
Admission: RE | Admit: 2022-09-27 | Discharge: 2022-09-27 | Disposition: A | Discharge status: Home / Self Care | Payer: PPO | Source: Ambulatory Visit | Attending: Podiatry | Admitting: Podiatry

## 2022-09-27 VITALS — Ht 60.0 in | Wt 119.0 lb

## 2022-09-27 DIAGNOSIS — Z86718 Personal history of other venous thrombosis and embolism: Secondary | ICD-10-CM | POA: Diagnosis not present

## 2022-09-27 DIAGNOSIS — N184 Chronic kidney disease, stage 4 (severe): Secondary | ICD-10-CM | POA: Diagnosis not present

## 2022-09-27 DIAGNOSIS — I129 Hypertensive chronic kidney disease with stage 1 through stage 4 chronic kidney disease, or unspecified chronic kidney disease: Secondary | ICD-10-CM | POA: Diagnosis not present

## 2022-09-27 DIAGNOSIS — F423 Hoarding disorder: Secondary | ICD-10-CM | POA: Diagnosis not present

## 2022-09-27 DIAGNOSIS — F330 Major depressive disorder, recurrent, mild: Secondary | ICD-10-CM | POA: Diagnosis not present

## 2022-09-27 DIAGNOSIS — E1169 Type 2 diabetes mellitus with other specified complication: Secondary | ICD-10-CM | POA: Diagnosis not present

## 2022-09-27 DIAGNOSIS — Z7982 Long term (current) use of aspirin: Secondary | ICD-10-CM | POA: Diagnosis not present

## 2022-09-27 DIAGNOSIS — M810 Age-related osteoporosis without current pathological fracture: Secondary | ICD-10-CM | POA: Diagnosis not present

## 2022-09-27 DIAGNOSIS — E7849 Other hyperlipidemia: Secondary | ICD-10-CM | POA: Diagnosis not present

## 2022-09-27 DIAGNOSIS — M109 Gout, unspecified: Secondary | ICD-10-CM | POA: Diagnosis not present

## 2022-09-27 DIAGNOSIS — E1122 Type 2 diabetes mellitus with diabetic chronic kidney disease: Secondary | ICD-10-CM | POA: Diagnosis not present

## 2022-09-27 DIAGNOSIS — G3184 Mild cognitive impairment, so stated: Secondary | ICD-10-CM | POA: Diagnosis not present

## 2022-09-27 DIAGNOSIS — Z87891 Personal history of nicotine dependence: Secondary | ICD-10-CM | POA: Diagnosis not present

## 2022-09-27 DIAGNOSIS — E11621 Type 2 diabetes mellitus with foot ulcer: Secondary | ICD-10-CM | POA: Diagnosis not present

## 2022-09-27 DIAGNOSIS — Z7984 Long term (current) use of oral hypoglycemic drugs: Secondary | ICD-10-CM | POA: Diagnosis not present

## 2022-09-27 DIAGNOSIS — E119 Type 2 diabetes mellitus without complications: Secondary | ICD-10-CM

## 2022-09-27 DIAGNOSIS — E1140 Type 2 diabetes mellitus with diabetic neuropathy, unspecified: Secondary | ICD-10-CM | POA: Diagnosis not present

## 2022-09-27 DIAGNOSIS — M1990 Unspecified osteoarthritis, unspecified site: Secondary | ICD-10-CM | POA: Diagnosis not present

## 2022-09-27 DIAGNOSIS — Z9181 History of falling: Secondary | ICD-10-CM | POA: Diagnosis not present

## 2022-09-27 DIAGNOSIS — E1165 Type 2 diabetes mellitus with hyperglycemia: Secondary | ICD-10-CM | POA: Diagnosis not present

## 2022-09-27 DIAGNOSIS — G43909 Migraine, unspecified, not intractable, without status migrainosus: Secondary | ICD-10-CM | POA: Diagnosis not present

## 2022-09-27 DIAGNOSIS — Z96651 Presence of right artificial knee joint: Secondary | ICD-10-CM | POA: Diagnosis not present

## 2022-09-27 DIAGNOSIS — M2031 Hallux varus (acquired), right foot: Secondary | ICD-10-CM | POA: Diagnosis not present

## 2022-09-27 DIAGNOSIS — Z01812 Encounter for preprocedural laboratory examination: Secondary | ICD-10-CM

## 2022-09-27 DIAGNOSIS — M86171 Other acute osteomyelitis, right ankle and foot: Secondary | ICD-10-CM | POA: Diagnosis not present

## 2022-09-27 DIAGNOSIS — L97512 Non-pressure chronic ulcer of other part of right foot with fat layer exposed: Secondary | ICD-10-CM | POA: Diagnosis not present

## 2022-09-27 HISTORY — DX: Hoarding disorder: F42.3

## 2022-09-27 NOTE — Patient Instructions (Addendum)
Your procedure is scheduled on: Friday, May 10 Report to the Registration Desk on the 1st floor of the [facility] To find out your arrival time, please call [phone] between 1PM - 3PM on: Thursday, May 9 If your arrival time is 6:00 am, do not arrive before that time as the [facility] entrance doors do not open until 6:00 am.  REMEMBER: Instructions that are not followed completely may result in serious medical risk, up to and including death; or upon the discretion of your surgeon and anesthesiologist your surgery may need to be rescheduled.  Do not eat food after midnight the night before surgery.  No gum chewing or hard candies.  You may however, drink water up to 2 hours before you are scheduled to arrive for your surgery. Do not drink anything within 2 hours of your scheduled arrival time.  In addition, your doctor has ordered for you to drink the provided:  Gatorade G2 (12 oz) Drinking this carbohydrate drink up to two hours before surgery helps to reduce insulin resistance and improve patient outcomes. Please complete drinking 2 hours before scheduled arrival time.  One week prior to surgery: starting May 3 Stop aspirin and Anti-inflammatories (NSAIDS) such as Advil, Aleve, Ibuprofen, Motrin, Naproxen, Naprosyn and Aspirin based products such as Excedrin, Goody's Powder, BC Powder. Stop ANY OVER THE COUNTER supplements until after surgery. Stop multiple vitamins. You may however, continue to take Tylenol if needed for pain up until the day of surgery.  Continue taking all prescribed medications  TAKE ONLY THESE MEDICATIONS THE MORNING OF SURGERY WITH A SIP OF WATER:  Metoprolol Omeprazole (Prilosec) - (take one the night before and one on the morning of surgery - helps to prevent nausea after surgery.)  No Alcohol for 24 hours before or after surgery.  No Smoking including e-cigarettes for 24 hours before surgery.  No chewable tobacco products for at least 6 hours  before surgery.  No nicotine patches on the day of surgery.  On the morning of surgery brush your teeth with toothpaste and water, you may rinse your mouth with mouthwash if you wish. Do not swallow any toothpaste or mouthwash.  Shower using antibacterial soap before coming to the hospital on the day of surgery.  Do not wear jewelry, make-up, hairpins, clips or nail polish.  Do not wear lotions, powders, or perfumes.   Do not shave body hair from the neck down 48 hours before surgery.  Contact lenses, hearing aids and dentures may not be worn into surgery.  Do not bring valuables to the hospital. [facility] is not responsible for any missing/lost belongings or valuables.   Notify your doctor if there is any change in your medical condition (cold, fever, infection).  Wear comfortable clothing (specific to your surgery type) to the hospital.  After surgery, you can help prevent lung complications by doing breathing exercises.  Take deep breaths and cough every 1-2 hours. Your doctor may order a device called an Incentive Spirometer to help you take deep breaths.  If you are being discharged the day of surgery, you will not be allowed to drive home. You will need a responsible individual to drive you home and stay with you for 24 hours after surgery.   If you are taking public transportation, you will need to have a responsible individual with you.  Please call the Pre-admissions Testing Dept. at [phone] if you have any questions about these instructions.  Surgery Visitation Policy:  Patients having  surgery or a procedure may have two visitors.  Children under the age of 43 must have an adult with them who is not the patient.

## 2022-10-02 DIAGNOSIS — E782 Mixed hyperlipidemia: Secondary | ICD-10-CM | POA: Diagnosis not present

## 2022-10-02 DIAGNOSIS — I10 Essential (primary) hypertension: Secondary | ICD-10-CM | POA: Diagnosis not present

## 2022-10-02 DIAGNOSIS — G3184 Mild cognitive impairment, so stated: Secondary | ICD-10-CM | POA: Diagnosis not present

## 2022-10-02 DIAGNOSIS — E1159 Type 2 diabetes mellitus with other circulatory complications: Secondary | ICD-10-CM | POA: Diagnosis not present

## 2022-10-02 DIAGNOSIS — J3089 Other allergic rhinitis: Secondary | ICD-10-CM | POA: Diagnosis not present

## 2022-10-02 DIAGNOSIS — K219 Gastro-esophageal reflux disease without esophagitis: Secondary | ICD-10-CM | POA: Diagnosis not present

## 2022-10-02 DIAGNOSIS — S98111A Complete traumatic amputation of right great toe, initial encounter: Secondary | ICD-10-CM | POA: Diagnosis not present

## 2022-10-03 MED ORDER — CEFAZOLIN SODIUM-DEXTROSE 2-4 GM/100ML-% IV SOLN
2.0000 g | INTRAVENOUS | Status: AC
  Administered 2022-10-04: 2 g via INTRAVENOUS

## 2022-10-03 MED ORDER — SODIUM CHLORIDE 0.9 % IV SOLN: INTRAVENOUS | Status: DC

## 2022-10-03 MED ORDER — ORAL CARE MOUTH RINSE: 15.0000 mL | Freq: Once | OROMUCOSAL | Status: AC

## 2022-10-03 MED ORDER — CHLORHEXIDINE GLUCONATE 0.12 % MT SOLN
15.0000 mL | Freq: Once | OROMUCOSAL | Status: AC
  Administered 2022-10-04: 15 mL via OROMUCOSAL

## 2022-10-04 ENCOUNTER — Other Ambulatory Visit: Payer: Self-pay

## 2022-10-04 ENCOUNTER — Ambulatory Visit: Payer: PPO | Admitting: Anesthesiology

## 2022-10-04 ENCOUNTER — Encounter: Payer: Self-pay | Admitting: Podiatry

## 2022-10-04 ENCOUNTER — Encounter
Admission: RE | Disposition: A | Discharge status: Home / Self Care | Payer: Self-pay | Source: Home / Self Care | Attending: Podiatry

## 2022-10-04 ENCOUNTER — Ambulatory Visit
Admission: RE | Admit: 2022-10-04 | Discharge: 2022-10-04 | Disposition: A | Discharge status: Home / Self Care | Payer: PPO | Attending: Podiatry | Admitting: Podiatry

## 2022-10-04 DIAGNOSIS — E119 Type 2 diabetes mellitus without complications: Secondary | ICD-10-CM

## 2022-10-04 DIAGNOSIS — E1142 Type 2 diabetes mellitus with diabetic polyneuropathy: Secondary | ICD-10-CM | POA: Insufficient documentation

## 2022-10-04 DIAGNOSIS — N184 Chronic kidney disease, stage 4 (severe): Secondary | ICD-10-CM | POA: Diagnosis not present

## 2022-10-04 DIAGNOSIS — L97512 Non-pressure chronic ulcer of other part of right foot with fat layer exposed: Secondary | ICD-10-CM | POA: Diagnosis not present

## 2022-10-04 DIAGNOSIS — Z01812 Encounter for preprocedural laboratory examination: Secondary | ICD-10-CM

## 2022-10-04 DIAGNOSIS — Z87891 Personal history of nicotine dependence: Secondary | ICD-10-CM | POA: Insufficient documentation

## 2022-10-04 DIAGNOSIS — E11621 Type 2 diabetes mellitus with foot ulcer: Secondary | ICD-10-CM | POA: Insufficient documentation

## 2022-10-04 DIAGNOSIS — I129 Hypertensive chronic kidney disease with stage 1 through stage 4 chronic kidney disease, or unspecified chronic kidney disease: Secondary | ICD-10-CM | POA: Diagnosis not present

## 2022-10-04 DIAGNOSIS — Z86718 Personal history of other venous thrombosis and embolism: Secondary | ICD-10-CM | POA: Insufficient documentation

## 2022-10-04 DIAGNOSIS — E1122 Type 2 diabetes mellitus with diabetic chronic kidney disease: Secondary | ICD-10-CM | POA: Insufficient documentation

## 2022-10-04 DIAGNOSIS — M1990 Unspecified osteoarthritis, unspecified site: Secondary | ICD-10-CM | POA: Insufficient documentation

## 2022-10-04 DIAGNOSIS — M86171 Other acute osteomyelitis, right ankle and foot: Secondary | ICD-10-CM | POA: Insufficient documentation

## 2022-10-04 DIAGNOSIS — T8489XA Other specified complication of internal orthopedic prosthetic devices, implants and grafts, initial encounter: Secondary | ICD-10-CM | POA: Diagnosis not present

## 2022-10-04 HISTORY — PX: WOUND EXPLORATION: SHX6188

## 2022-10-04 LAB — POCT I-STAT, CHEM 8
BUN: 31 mg/dL — ABNORMAL HIGH (ref 8–23)
Calcium, Ion: 1.2 mmol/L (ref 1.15–1.40)
Chloride: 103 mmol/L (ref 98–111)
Creatinine, Ser: 1.8 mg/dL — ABNORMAL HIGH (ref 0.44–1.00)
Glucose, Bld: 176 mg/dL — ABNORMAL HIGH (ref 70–99)
HCT: 36 % (ref 36.0–46.0)
Hemoglobin: 12.2 g/dL (ref 12.0–15.0)
Potassium: 3.5 mmol/L (ref 3.5–5.1)
Sodium: 142 mmol/L (ref 135–145)
TCO2: 26 mmol/L (ref 22–32)

## 2022-10-04 LAB — GLUCOSE, CAPILLARY
Glucose-Capillary: 159 mg/dL — ABNORMAL HIGH (ref 70–99)
Glucose-Capillary: 196 mg/dL — ABNORMAL HIGH (ref 70–99)

## 2022-10-04 LAB — AEROBIC/ANAEROBIC CULTURE W GRAM STAIN (SURGICAL/DEEP WOUND)

## 2022-10-04 SURGERY — WOUND EXPLORATION
Anesthesia: Monitor Anesthesia Care | Laterality: Right

## 2022-10-04 MED ORDER — PROPOFOL 10 MG/ML IV BOLUS
INTRAVENOUS | Status: AC
  Filled 2022-10-04: qty 20

## 2022-10-04 MED ORDER — LIDOCAINE-EPINEPHRINE 1 %-1:100000 IJ SOLN
INTRAMUSCULAR | Status: AC
  Filled 2022-10-04: qty 1

## 2022-10-04 MED ORDER — ACETAMINOPHEN 10 MG/ML IV SOLN: 15.0000 mg/kg | Freq: Once | INTRAVENOUS | Status: DC | PRN

## 2022-10-04 MED ORDER — CHLORHEXIDINE GLUCONATE 0.12 % MT SOLN
OROMUCOSAL | Status: AC
  Filled 2022-10-04: qty 15

## 2022-10-04 MED ORDER — PROPOFOL 500 MG/50ML IV EMUL
INTRAVENOUS | Status: DC | PRN
  Administered 2022-10-04: 25 ug/kg/min via INTRAVENOUS

## 2022-10-04 MED ORDER — 0.9 % SODIUM CHLORIDE (POUR BTL) OPTIME
TOPICAL | Status: DC | PRN
  Administered 2022-10-04: 1000 mL

## 2022-10-04 MED ORDER — EPINEPHRINE PF 1 MG/ML IJ SOLN
INTRAMUSCULAR | Status: AC
  Filled 2022-10-04: qty 1

## 2022-10-04 MED ORDER — METOCLOPRAMIDE HCL 10 MG PO TABS: 5.0000 mg | ORAL_TABLET | Freq: Three times a day (TID) | ORAL | Status: DC | PRN

## 2022-10-04 MED ORDER — FENTANYL CITRATE (PF) 100 MCG/2ML IJ SOLN
INTRAMUSCULAR | Status: AC
  Filled 2022-10-04: qty 2

## 2022-10-04 MED ORDER — CEFAZOLIN SODIUM-DEXTROSE 2-4 GM/100ML-% IV SOLN
INTRAVENOUS | Status: AC
  Filled 2022-10-04: qty 100

## 2022-10-04 MED ORDER — LIDOCAINE HCL (PF) 2 % IJ SOLN
INTRAMUSCULAR | Status: AC
  Filled 2022-10-04: qty 5

## 2022-10-04 MED ORDER — LACTATED RINGERS IV SOLN: INTRAVENOUS | Status: DC

## 2022-10-04 MED ORDER — OXYCODONE HCL 5 MG/5ML PO SOLN: 5.0000 mg | Freq: Once | ORAL | Status: DC | PRN

## 2022-10-04 MED ORDER — PENTAFLUOROPROP-TETRAFLUOROETH EX AERO
INHALATION_SPRAY | CUTANEOUS | Status: AC
  Filled 2022-10-04: qty 30

## 2022-10-04 MED ORDER — METOCLOPRAMIDE HCL 5 MG/ML IJ SOLN: 5.0000 mg | Freq: Three times a day (TID) | INTRAMUSCULAR | Status: DC | PRN

## 2022-10-04 MED ORDER — ONDANSETRON HCL 4 MG/2ML IJ SOLN
INTRAMUSCULAR | Status: AC
  Filled 2022-10-04: qty 2

## 2022-10-04 MED ORDER — ONDANSETRON HCL 4 MG/2ML IJ SOLN: 4.0000 mg | Freq: Once | INTRAMUSCULAR | Status: DC | PRN

## 2022-10-04 MED ORDER — DEXAMETHASONE SODIUM PHOSPHATE 10 MG/ML IJ SOLN
INTRAMUSCULAR | Status: AC
  Filled 2022-10-04: qty 1

## 2022-10-04 MED ORDER — OXYCODONE HCL 5 MG PO TABS: 5.0000 mg | ORAL_TABLET | Freq: Once | ORAL | Status: DC | PRN

## 2022-10-04 MED ORDER — FENTANYL CITRATE (PF) 100 MCG/2ML IJ SOLN
INTRAMUSCULAR | Status: DC | PRN
  Administered 2022-10-04: 25 ug via INTRAVENOUS
  Administered 2022-10-04: 50 ug via INTRAVENOUS

## 2022-10-04 MED ORDER — DOXYCYCLINE MONOHYDRATE 100 MG PO CAPS: 100.0000 mg | ORAL_CAPSULE | Freq: Two times a day (BID) | ORAL | 0 refills | Status: AC

## 2022-10-04 MED ORDER — LIDOCAINE-EPINEPHRINE 1 %-1:100000 IJ SOLN
INTRAMUSCULAR | Status: DC | PRN
  Administered 2022-10-04: 6 mL

## 2022-10-04 MED ORDER — ONDANSETRON HCL 4 MG/2ML IJ SOLN: 4.0000 mg | Freq: Four times a day (QID) | INTRAMUSCULAR | Status: DC | PRN

## 2022-10-04 MED ORDER — BUPIVACAINE HCL (PF) 0.5 % IJ SOLN
INTRAMUSCULAR | Status: AC
  Filled 2022-10-04: qty 30

## 2022-10-04 MED ORDER — LIDOCAINE HCL (PF) 1 % IJ SOLN
INTRAMUSCULAR | Status: AC
  Filled 2022-10-04: qty 30

## 2022-10-04 MED ORDER — FENTANYL CITRATE (PF) 100 MCG/2ML IJ SOLN: 25.0000 ug | INTRAMUSCULAR | Status: DC | PRN

## 2022-10-04 MED ORDER — ONDANSETRON HCL 4 MG PO TABS: 4.0000 mg | ORAL_TABLET | Freq: Four times a day (QID) | ORAL | Status: DC | PRN

## 2022-10-04 SURGICAL SUPPLY — 53 items
BLADE OSCILLATING/SAGITTAL (BLADE)
BLADE SURG 15 STRL LF DISP TIS (BLADE) ×1 IMPLANT
BLADE SURG 15 STRL SS (BLADE) ×1
BLADE SW THK.38XMED LNG THN (BLADE) IMPLANT
BNDG CMPR 5X4 CHSV STRCH STRL (GAUZE/BANDAGES/DRESSINGS) ×1
BNDG CMPR 5X4 KNIT ELC UNQ LF (GAUZE/BANDAGES/DRESSINGS) ×1
BNDG CMPR 5X6 CHSV STRCH STRL (GAUZE/BANDAGES/DRESSINGS) ×1
BNDG COHESIVE 4X5 TAN STRL LF (GAUZE/BANDAGES/DRESSINGS) ×1 IMPLANT
BNDG COHESIVE 6X5 TAN ST LF (GAUZE/BANDAGES/DRESSINGS) ×1 IMPLANT
BNDG ELASTIC 4INX 5YD STR LF (GAUZE/BANDAGES/DRESSINGS) ×1 IMPLANT
BNDG ESMARCH 4 X 12 STRL LF (GAUZE/BANDAGES/DRESSINGS) ×1
BNDG ESMARCH 4X12 STRL LF (GAUZE/BANDAGES/DRESSINGS) ×1 IMPLANT
BNDG GAUZE DERMACEA FLUFF 4 (GAUZE/BANDAGES/DRESSINGS) ×1 IMPLANT
BNDG GZE 12X3 1 PLY HI ABS (GAUZE/BANDAGES/DRESSINGS) ×1
BNDG GZE DERMACEA 4 6PLY (GAUZE/BANDAGES/DRESSINGS) ×1
BNDG STRETCH GAUZE 3IN X12FT (GAUZE/BANDAGES/DRESSINGS) ×1 IMPLANT
DRSG XEROFORM 1X8 (GAUZE/BANDAGES/DRESSINGS) IMPLANT
DURAPREP 26ML APPLICATOR (WOUND CARE) ×1 IMPLANT
ELECT REM PT RETURN 9FT ADLT (ELECTROSURGICAL) ×1
ELECTRODE REM PT RTRN 9FT ADLT (ELECTROSURGICAL) ×1 IMPLANT
GAUZE PACKING 0.25INX5YD STRL (GAUZE/BANDAGES/DRESSINGS) ×1 IMPLANT
GAUZE SPONGE 4X4 12PLY STRL (GAUZE/BANDAGES/DRESSINGS) ×1 IMPLANT
GLOVE BIO SURGEON STRL SZ7.5 (GLOVE) ×1 IMPLANT
GLOVE INDICATOR 8.0 STRL GRN (GLOVE) ×1 IMPLANT
GOWN STRL REUS W/ TWL XL LVL3 (GOWN DISPOSABLE) ×2 IMPLANT
GOWN STRL REUS W/TWL MED LVL3 (GOWN DISPOSABLE) ×1 IMPLANT
GOWN STRL REUS W/TWL XL LVL3 (GOWN DISPOSABLE) ×2
HANDPIECE VERSAJET DEBRIDEMENT (MISCELLANEOUS) IMPLANT
IV NS 1000ML (IV SOLUTION) ×1
IV NS 1000ML BAXH (IV SOLUTION) ×1 IMPLANT
IV NS IRRIG 3000ML ARTHROMATIC (IV SOLUTION) ×1 IMPLANT
KIT TURNOVER KIT A (KITS) ×1 IMPLANT
LABEL OR SOLS (LABEL) ×1 IMPLANT
MANIFOLD NEPTUNE II (INSTRUMENTS) ×1 IMPLANT
NDL FILTER BLUNT 18X1 1/2 (NEEDLE) ×1 IMPLANT
NDL HYPO 25X1 1.5 SAFETY (NEEDLE) ×1 IMPLANT
NEEDLE FILTER BLUNT 18X1 1/2 (NEEDLE) ×1 IMPLANT
NEEDLE HYPO 25X1 1.5 SAFETY (NEEDLE) ×1 IMPLANT
NS IRRIG 500ML POUR BTL (IV SOLUTION) ×1 IMPLANT
PACK EXTREMITY ARMC (MISCELLANEOUS) ×1 IMPLANT
PAD ABD DERMACEA PRESS 5X9 (GAUZE/BANDAGES/DRESSINGS) ×1 IMPLANT
PULSAVAC PLUS IRRIG FAN TIP (DISPOSABLE) ×1
SHIELD FULL FACE ANTIFOG 7M (MISCELLANEOUS) ×1 IMPLANT
STOCKINETTE IMPERVIOUS 9X36 MD (GAUZE/BANDAGES/DRESSINGS) ×1 IMPLANT
SUT ETHILON 3-0 (SUTURE) IMPLANT
SUT VIC AB 3-0 SH 27 (SUTURE) ×1
SUT VIC AB 3-0 SH 27X BRD (SUTURE) ×1 IMPLANT
SWAB CULTURE AMIES ANAERIB BLU (MISCELLANEOUS) IMPLANT
SYR 10ML LL (SYRINGE) ×1 IMPLANT
SYR 3ML LL SCALE MARK (SYRINGE) ×1 IMPLANT
TIP FAN IRRIG PULSAVAC PLUS (DISPOSABLE) ×1 IMPLANT
TRAP FLUID SMOKE EVACUATOR (MISCELLANEOUS) ×1 IMPLANT
WATER STERILE IRR 500ML POUR (IV SOLUTION) ×1 IMPLANT

## 2022-10-04 NOTE — Transfer of Care (Signed)
Immediate Anesthesia Transfer of Care Note  Patient: Joy Tapia  Procedure(s) Performed: SECONDARY CLOSURE OF SURGICAL WOUND (Right)  Patient Location: PACU  Anesthesia Type:MAC  Level of Consciousness: awake, alert , and oriented  Airway & Oxygen Therapy: Patient Spontanous Breathing  Post-op Assessment: Report given to RN and Post -op Vital signs reviewed and stable  Post vital signs: Reviewed and stable  Last Vitals:  Vitals Value Taken Time  BP 180/66 10/04/22 0811  Temp 36.7 C 10/04/22 0811  Pulse 65 10/04/22 0814  Resp 12 10/04/22 0814  SpO2 100 % 10/04/22 0814  Vitals shown include unvalidated device data.  Last Pain:  Vitals:   10/04/22 0811  TempSrc:   PainSc: 0-No pain         Complications: No notable events documented.

## 2022-10-04 NOTE — Discharge Instructions (Addendum)
[facility]  POST OPERATIVE INSTRUCTIONS FOR DR. Ether Griffins AND DR. BAKER [facility] POD[facility]ATRY DEPARTMENT   Take your medication as prescribed.  Pain medication should be taken only as needed.  Keep the dressing clean, dry and intact.  Keep your foot elevated above the heart level for the first 48 hours.  We have instructed you to be non-weight bearing.  Only use heel of foot for balance and transfer.  Always wear your post-op shoe when walking.  Always use your crutches if you are to be non-weight bearing.  Do not take a shower. Baths are permissible as long as the foot is kept out of the water.   Every hour you are awake:  Bend your knee 15 times. Flex foot 15 times Massage calf 15 times  Call [facility] [phone]) if any of the following problems occur: You develop a temperature or fever. The bandage becomes saturated with blood. Medication does not stop your pain. Injury of the foot occurs. Any symptoms of infection including redness, odor, or red streaks running from wound.   AMBULATORY SURGERY  DISCHARGE INSTRUCTIONS   The drugs that you were given will stay in your system until tomorrow so for the next 24 hours you should not:  Drive an automobile Make any legal decisions Drink any alcoholic beverage   You may resume regular meals tomorrow.  Today it is better to start with liquids and gradually work up to solid foods.  You may eat anything you prefer, but it is better to start with liquids, then soup and crackers, and gradually work up to solid foods.   Please notify your doctor immediately if you have any unusual bleeding, trouble breathing, redness and pain at the surgery site, drainage, fever, or pain not relieved by medication.    Your post-operative visit with Dr.                                       is: Date:                        Time:    Please call to schedule your post-operative  visit.  Additional Instructions:

## 2022-10-04 NOTE — Anesthesia Postprocedure Evaluation (Signed)
Anesthesia Post Note  Patient: Joy Tapia  Procedure(s) Performed: SECONDARY CLOSURE OF SURGICAL WOUND (Right)  Patient location during evaluation: PACU Anesthesia Type: MAC Level of consciousness: awake and alert, oriented and patient cooperative Pain management: pain level controlled Vital Signs Assessment: post-procedure vital signs reviewed and stable Respiratory status: spontaneous breathing, nonlabored ventilation and respiratory function stable Cardiovascular status: blood pressure returned to baseline and stable Postop Assessment: adequate PO intake Anesthetic complications: no   No notable events documented.   Last Vitals:  Vitals:   10/04/22 0830 10/04/22 0845  BP: (!) 166/71 (!) 177/62  Pulse: 60 60  Resp: 19 16  Temp: 36.6 C 36.6 C  SpO2: 100% 100%    Last Pain:  Vitals:   10/04/22 0845  TempSrc: Temporal  PainSc: 0-No pain                 Reed Breech

## 2022-10-04 NOTE — Op Note (Signed)
Operative note   Surgeon:Vantasia Pinkney Armed forces logistics/support/administrative officer: None    Preop diagnosis: Nonhealing wound right forefoot    Postop diagnosis: Same    Procedure: Delayed primary closure nonhealing wound right forefoot    EBL: Minimal    Anesthesia:local and IV sedation.  Local consisted of a total of 6 cc of a one-to-one mixture of 0.5% bupivacaine plain and 1% lidocaine with epinephrine    Hemostasis: Lidocaine with epinephrine    Specimen: Deep wound culture    Complications: None    Operative indications:Joy Tapia is an 82 y.o. that presents today for surgical intervention.  The risks/benefits/alternatives/complications have been discussed and consent has been given.    Procedure:  Patient was brought into the OR and placed on the operating table in thesupine position. After anesthesia was obtained theright lower extremity was prepped and draped in usual sterile fashion.  Attention was directed to the distal previous first ray amputation site where the chronic nonhealing open wound was noted.  Sutures were removed from the entire incision site.  An approximate 3 cm wound was noted.  Full-thickness dissection was carried down into the subcutaneous and deep tissue.  No exposure of bone was seen at this time.  All of the nonviable tissue was then removed with a Versajet down to good healthy bleeding tissue.  Next layered closure was performed with a 3-0 Vicryl for the deeper tissue and subcutaneous tissue and a 3-0 nylon for the skin.  A large bulky padded dressing was applied.    Patient tolerated the procedure and anesthesia well.  Was transported from the OR to the PACU with all vital signs stable and vascular status intact. To be discharged per routine protocol.  Will follow up in approximately 1 week in the outpatient clinic.

## 2022-10-04 NOTE — Anesthesia Preprocedure Evaluation (Addendum)
Anesthesia Evaluation  Patient identified by MRN, date of birth, ID band Patient awake    Reviewed: Allergy & Precautions, NPO status , Patient's Chart, lab work & pertinent test results  History of Anesthesia Complications Negative for: history of anesthetic complications  Airway Mallampati: III   Neck ROM: Full    Dental  (+) Partial Upper   Pulmonary former smoker (remote history)   Pulmonary exam normal breath sounds clear to auscultation       Cardiovascular hypertension, Normal cardiovascular exam Rhythm:Regular Rate:Normal  Hx DVT   ECG 06/28/22: SR   Neuro/Psych  Headaches  Neuromuscular disease (peripheral neuropathy)    GI/Hepatic negative GI ROS,,,  Endo/Other  diabetes, Type 2    Renal/GU Renal disease (stage IV CKD)     Musculoskeletal  (+) Arthritis ,    Abdominal   Peds  Hematology negative hematology ROS (+)   Anesthesia Other Findings   Reproductive/Obstetrics                             Anesthesia Physical Anesthesia Plan  ASA: 2  Anesthesia Plan: General   Post-op Pain Management:    Induction: Intravenous  PONV Risk Score and Plan: 3 and Propofol infusion, TIVA, Treatment may vary due to age or medical condition and Ondansetron  Airway Management Planned: Natural Airway  Additional Equipment:   Intra-op Plan:   Post-operative Plan:   Informed Consent: I have reviewed the patients History and Physical, chart, labs and discussed the procedure including the risks, benefits and alternatives for the proposed anesthesia with the patient or authorized representative who has indicated his/her understanding and acceptance.       Plan Discussed with: CRNA  Anesthesia Plan Comments: (LMA/GETA backup discussed.  Patient consented for risks of anesthesia including but not limited to:  - adverse reactions to medications - damage to eyes, teeth, lips or other  oral mucosa - nerve damage due to positioning  - sore throat or hoarseness - damage to heart, brain, nerves, lungs, other parts of body or loss of life  Informed patient about role of CRNA in peri- and intra-operative care.  Patient voiced understanding.)        Anesthesia Quick Evaluation

## 2022-10-04 NOTE — H&P (Signed)
HISTORY AND PHYSICAL INTERVAL NOTE:  10/04/2022  7:18 AM  Joy Tapia  has presented today for surgery, with the diagnosis of L97.512 - Ulcer of right foot with fat layer exposed M86.171 - Acute Osteomyelitis of right ankle or foot E11.40 - Type 2 diabetes mellitus with diabetic nneuropathy, without long term current use of insulin.  The various methods of treatment have been discussed with the patient.  No guarantees were given.  After consideration of risks, benefits and other options for treatment, the patient has consented to surgery.  I have reviewed the patients' chart and labs.     A history and physical examination was performed in my office.  The patient was reexamined.  There have been no changes to this history and physical examination.  [name] A

## 2022-10-05 ENCOUNTER — Encounter: Payer: Self-pay | Admitting: Podiatry

## 2022-10-05 LAB — AEROBIC/ANAEROBIC CULTURE W GRAM STAIN (SURGICAL/DEEP WOUND): Gram Stain: NONE SEEN

## 2022-10-06 LAB — AEROBIC/ANAEROBIC CULTURE W GRAM STAIN (SURGICAL/DEEP WOUND)

## 2022-10-08 DIAGNOSIS — Z7982 Long term (current) use of aspirin: Secondary | ICD-10-CM | POA: Diagnosis not present

## 2022-10-08 DIAGNOSIS — N184 Chronic kidney disease, stage 4 (severe): Secondary | ICD-10-CM | POA: Diagnosis not present

## 2022-10-08 DIAGNOSIS — I129 Hypertensive chronic kidney disease with stage 1 through stage 4 chronic kidney disease, or unspecified chronic kidney disease: Secondary | ICD-10-CM | POA: Diagnosis not present

## 2022-10-08 DIAGNOSIS — G3184 Mild cognitive impairment, so stated: Secondary | ICD-10-CM | POA: Diagnosis not present

## 2022-10-08 DIAGNOSIS — E11621 Type 2 diabetes mellitus with foot ulcer: Secondary | ICD-10-CM | POA: Diagnosis not present

## 2022-10-08 DIAGNOSIS — G43909 Migraine, unspecified, not intractable, without status migrainosus: Secondary | ICD-10-CM | POA: Diagnosis not present

## 2022-10-08 DIAGNOSIS — M1990 Unspecified osteoarthritis, unspecified site: Secondary | ICD-10-CM | POA: Diagnosis not present

## 2022-10-08 DIAGNOSIS — E1169 Type 2 diabetes mellitus with other specified complication: Secondary | ICD-10-CM | POA: Diagnosis not present

## 2022-10-08 DIAGNOSIS — Z96651 Presence of right artificial knee joint: Secondary | ICD-10-CM | POA: Diagnosis not present

## 2022-10-08 DIAGNOSIS — M2031 Hallux varus (acquired), right foot: Secondary | ICD-10-CM | POA: Diagnosis not present

## 2022-10-08 DIAGNOSIS — E1140 Type 2 diabetes mellitus with diabetic neuropathy, unspecified: Secondary | ICD-10-CM | POA: Diagnosis not present

## 2022-10-08 DIAGNOSIS — M109 Gout, unspecified: Secondary | ICD-10-CM | POA: Diagnosis not present

## 2022-10-08 DIAGNOSIS — E1122 Type 2 diabetes mellitus with diabetic chronic kidney disease: Secondary | ICD-10-CM | POA: Diagnosis not present

## 2022-10-08 DIAGNOSIS — L97512 Non-pressure chronic ulcer of other part of right foot with fat layer exposed: Secondary | ICD-10-CM | POA: Diagnosis not present

## 2022-10-08 DIAGNOSIS — E7849 Other hyperlipidemia: Secondary | ICD-10-CM | POA: Diagnosis not present

## 2022-10-08 DIAGNOSIS — Z7984 Long term (current) use of oral hypoglycemic drugs: Secondary | ICD-10-CM | POA: Diagnosis not present

## 2022-10-08 DIAGNOSIS — Z9181 History of falling: Secondary | ICD-10-CM | POA: Diagnosis not present

## 2022-10-08 DIAGNOSIS — E1165 Type 2 diabetes mellitus with hyperglycemia: Secondary | ICD-10-CM | POA: Diagnosis not present

## 2022-10-08 DIAGNOSIS — F423 Hoarding disorder: Secondary | ICD-10-CM | POA: Diagnosis not present

## 2022-10-08 DIAGNOSIS — Z87891 Personal history of nicotine dependence: Secondary | ICD-10-CM | POA: Diagnosis not present

## 2022-10-08 DIAGNOSIS — M810 Age-related osteoporosis without current pathological fracture: Secondary | ICD-10-CM | POA: Diagnosis not present

## 2022-10-08 DIAGNOSIS — M86171 Other acute osteomyelitis, right ankle and foot: Secondary | ICD-10-CM | POA: Diagnosis not present

## 2022-10-08 DIAGNOSIS — Z86718 Personal history of other venous thrombosis and embolism: Secondary | ICD-10-CM | POA: Diagnosis not present

## 2022-10-09 LAB — AEROBIC/ANAEROBIC CULTURE W GRAM STAIN (SURGICAL/DEEP WOUND)

## 2022-10-14 DIAGNOSIS — Z86718 Personal history of other venous thrombosis and embolism: Secondary | ICD-10-CM | POA: Diagnosis not present

## 2022-10-14 DIAGNOSIS — E7849 Other hyperlipidemia: Secondary | ICD-10-CM | POA: Diagnosis not present

## 2022-10-14 DIAGNOSIS — I129 Hypertensive chronic kidney disease with stage 1 through stage 4 chronic kidney disease, or unspecified chronic kidney disease: Secondary | ICD-10-CM | POA: Diagnosis not present

## 2022-10-14 DIAGNOSIS — E1122 Type 2 diabetes mellitus with diabetic chronic kidney disease: Secondary | ICD-10-CM | POA: Diagnosis not present

## 2022-10-14 DIAGNOSIS — E1140 Type 2 diabetes mellitus with diabetic neuropathy, unspecified: Secondary | ICD-10-CM | POA: Diagnosis not present

## 2022-10-14 DIAGNOSIS — Z7984 Long term (current) use of oral hypoglycemic drugs: Secondary | ICD-10-CM | POA: Diagnosis not present

## 2022-10-14 DIAGNOSIS — G43909 Migraine, unspecified, not intractable, without status migrainosus: Secondary | ICD-10-CM | POA: Diagnosis not present

## 2022-10-14 DIAGNOSIS — M2031 Hallux varus (acquired), right foot: Secondary | ICD-10-CM | POA: Diagnosis not present

## 2022-10-14 DIAGNOSIS — F423 Hoarding disorder: Secondary | ICD-10-CM | POA: Diagnosis not present

## 2022-10-14 DIAGNOSIS — E11621 Type 2 diabetes mellitus with foot ulcer: Secondary | ICD-10-CM | POA: Diagnosis not present

## 2022-10-14 DIAGNOSIS — G3184 Mild cognitive impairment, so stated: Secondary | ICD-10-CM | POA: Diagnosis not present

## 2022-10-14 DIAGNOSIS — Z9181 History of falling: Secondary | ICD-10-CM | POA: Diagnosis not present

## 2022-10-14 DIAGNOSIS — M1990 Unspecified osteoarthritis, unspecified site: Secondary | ICD-10-CM | POA: Diagnosis not present

## 2022-10-14 DIAGNOSIS — Z7982 Long term (current) use of aspirin: Secondary | ICD-10-CM | POA: Diagnosis not present

## 2022-10-14 DIAGNOSIS — Z96651 Presence of right artificial knee joint: Secondary | ICD-10-CM | POA: Diagnosis not present

## 2022-10-14 DIAGNOSIS — L97512 Non-pressure chronic ulcer of other part of right foot with fat layer exposed: Secondary | ICD-10-CM | POA: Diagnosis not present

## 2022-10-14 DIAGNOSIS — Z87891 Personal history of nicotine dependence: Secondary | ICD-10-CM | POA: Diagnosis not present

## 2022-10-14 DIAGNOSIS — M810 Age-related osteoporosis without current pathological fracture: Secondary | ICD-10-CM | POA: Diagnosis not present

## 2022-10-14 DIAGNOSIS — E1169 Type 2 diabetes mellitus with other specified complication: Secondary | ICD-10-CM | POA: Diagnosis not present

## 2022-10-14 DIAGNOSIS — M86171 Other acute osteomyelitis, right ankle and foot: Secondary | ICD-10-CM | POA: Diagnosis not present

## 2022-10-14 DIAGNOSIS — E1165 Type 2 diabetes mellitus with hyperglycemia: Secondary | ICD-10-CM | POA: Diagnosis not present

## 2022-10-14 DIAGNOSIS — N184 Chronic kidney disease, stage 4 (severe): Secondary | ICD-10-CM | POA: Diagnosis not present

## 2022-10-14 DIAGNOSIS — M109 Gout, unspecified: Secondary | ICD-10-CM | POA: Diagnosis not present

## 2022-10-17 DIAGNOSIS — E1165 Type 2 diabetes mellitus with hyperglycemia: Secondary | ICD-10-CM | POA: Diagnosis not present

## 2022-10-17 DIAGNOSIS — I129 Hypertensive chronic kidney disease with stage 1 through stage 4 chronic kidney disease, or unspecified chronic kidney disease: Secondary | ICD-10-CM | POA: Diagnosis not present

## 2022-10-18 DIAGNOSIS — F330 Major depressive disorder, recurrent, mild: Secondary | ICD-10-CM | POA: Diagnosis not present

## 2022-11-04 DIAGNOSIS — E1165 Type 2 diabetes mellitus with hyperglycemia: Secondary | ICD-10-CM | POA: Diagnosis not present

## 2022-11-04 DIAGNOSIS — E790 Hyperuricemia without signs of inflammatory arthritis and tophaceous disease: Secondary | ICD-10-CM | POA: Diagnosis not present

## 2022-11-04 DIAGNOSIS — J309 Allergic rhinitis, unspecified: Secondary | ICD-10-CM | POA: Diagnosis not present

## 2022-11-04 DIAGNOSIS — E782 Mixed hyperlipidemia: Secondary | ICD-10-CM | POA: Diagnosis not present

## 2022-11-04 DIAGNOSIS — N184 Chronic kidney disease, stage 4 (severe): Secondary | ICD-10-CM | POA: Diagnosis not present

## 2022-11-04 DIAGNOSIS — K219 Gastro-esophageal reflux disease without esophagitis: Secondary | ICD-10-CM | POA: Diagnosis not present

## 2022-11-04 DIAGNOSIS — I129 Hypertensive chronic kidney disease with stage 1 through stage 4 chronic kidney disease, or unspecified chronic kidney disease: Secondary | ICD-10-CM | POA: Diagnosis not present

## 2022-11-06 DIAGNOSIS — N184 Chronic kidney disease, stage 4 (severe): Secondary | ICD-10-CM | POA: Diagnosis not present

## 2022-11-06 DIAGNOSIS — K219 Gastro-esophageal reflux disease without esophagitis: Secondary | ICD-10-CM | POA: Diagnosis not present

## 2022-11-06 DIAGNOSIS — I129 Hypertensive chronic kidney disease with stage 1 through stage 4 chronic kidney disease, or unspecified chronic kidney disease: Secondary | ICD-10-CM | POA: Diagnosis not present

## 2022-11-06 DIAGNOSIS — J309 Allergic rhinitis, unspecified: Secondary | ICD-10-CM | POA: Diagnosis not present

## 2022-11-08 ENCOUNTER — Encounter: Payer: Self-pay | Admitting: Podiatry

## 2022-11-08 DIAGNOSIS — F330 Major depressive disorder, recurrent, mild: Secondary | ICD-10-CM | POA: Diagnosis not present

## 2022-11-08 DIAGNOSIS — G3184 Mild cognitive impairment, so stated: Secondary | ICD-10-CM | POA: Diagnosis not present

## 2022-11-11 DIAGNOSIS — L97512 Non-pressure chronic ulcer of other part of right foot with fat layer exposed: Secondary | ICD-10-CM | POA: Diagnosis not present

## 2022-11-11 DIAGNOSIS — E11621 Type 2 diabetes mellitus with foot ulcer: Secondary | ICD-10-CM | POA: Diagnosis not present

## 2022-11-11 DIAGNOSIS — E1140 Type 2 diabetes mellitus with diabetic neuropathy, unspecified: Secondary | ICD-10-CM | POA: Diagnosis not present

## 2022-11-11 DIAGNOSIS — M86171 Other acute osteomyelitis, right ankle and foot: Secondary | ICD-10-CM | POA: Diagnosis not present

## 2022-11-11 DIAGNOSIS — E1165 Type 2 diabetes mellitus with hyperglycemia: Secondary | ICD-10-CM | POA: Diagnosis not present

## 2022-11-11 DIAGNOSIS — E7849 Other hyperlipidemia: Secondary | ICD-10-CM | POA: Diagnosis not present

## 2022-11-11 DIAGNOSIS — E1169 Type 2 diabetes mellitus with other specified complication: Secondary | ICD-10-CM | POA: Diagnosis not present

## 2022-11-12 DIAGNOSIS — L97512 Non-pressure chronic ulcer of other part of right foot with fat layer exposed: Secondary | ICD-10-CM | POA: Diagnosis not present

## 2022-11-14 DIAGNOSIS — E11621 Type 2 diabetes mellitus with foot ulcer: Secondary | ICD-10-CM | POA: Diagnosis not present

## 2022-11-14 DIAGNOSIS — E1140 Type 2 diabetes mellitus with diabetic neuropathy, unspecified: Secondary | ICD-10-CM | POA: Diagnosis not present

## 2022-11-14 DIAGNOSIS — E785 Hyperlipidemia, unspecified: Secondary | ICD-10-CM | POA: Diagnosis not present

## 2022-11-14 DIAGNOSIS — M86171 Other acute osteomyelitis, right ankle and foot: Secondary | ICD-10-CM | POA: Diagnosis not present

## 2022-11-14 DIAGNOSIS — E1169 Type 2 diabetes mellitus with other specified complication: Secondary | ICD-10-CM | POA: Diagnosis not present

## 2022-11-14 DIAGNOSIS — E1165 Type 2 diabetes mellitus with hyperglycemia: Secondary | ICD-10-CM | POA: Diagnosis not present

## 2022-11-14 DIAGNOSIS — L97512 Non-pressure chronic ulcer of other part of right foot with fat layer exposed: Secondary | ICD-10-CM | POA: Diagnosis not present

## 2022-11-14 DIAGNOSIS — E7849 Other hyperlipidemia: Secondary | ICD-10-CM | POA: Diagnosis not present

## 2022-11-14 DIAGNOSIS — I10 Essential (primary) hypertension: Secondary | ICD-10-CM | POA: Diagnosis not present

## 2022-12-06 DIAGNOSIS — F330 Major depressive disorder, recurrent, mild: Secondary | ICD-10-CM | POA: Diagnosis not present

## 2022-12-10 DIAGNOSIS — G3184 Mild cognitive impairment, so stated: Secondary | ICD-10-CM | POA: Diagnosis not present

## 2022-12-10 DIAGNOSIS — F330 Major depressive disorder, recurrent, mild: Secondary | ICD-10-CM | POA: Diagnosis not present

## 2022-12-17 DIAGNOSIS — I739 Peripheral vascular disease, unspecified: Secondary | ICD-10-CM | POA: Diagnosis not present

## 2022-12-17 DIAGNOSIS — M2042 Other hammer toe(s) (acquired), left foot: Secondary | ICD-10-CM | POA: Diagnosis not present

## 2022-12-17 DIAGNOSIS — B351 Tinea unguium: Secondary | ICD-10-CM | POA: Diagnosis not present

## 2022-12-17 DIAGNOSIS — M79675 Pain in left toe(s): Secondary | ICD-10-CM | POA: Diagnosis not present

## 2022-12-19 DIAGNOSIS — F330 Major depressive disorder, recurrent, mild: Secondary | ICD-10-CM | POA: Diagnosis not present

## 2022-12-23 DIAGNOSIS — I129 Hypertensive chronic kidney disease with stage 1 through stage 4 chronic kidney disease, or unspecified chronic kidney disease: Secondary | ICD-10-CM | POA: Diagnosis not present

## 2022-12-23 DIAGNOSIS — E1165 Type 2 diabetes mellitus with hyperglycemia: Secondary | ICD-10-CM | POA: Diagnosis not present

## 2022-12-24 DIAGNOSIS — K219 Gastro-esophageal reflux disease without esophagitis: Secondary | ICD-10-CM | POA: Diagnosis not present

## 2022-12-24 DIAGNOSIS — G3184 Mild cognitive impairment, so stated: Secondary | ICD-10-CM | POA: Diagnosis not present

## 2022-12-24 DIAGNOSIS — E782 Mixed hyperlipidemia: Secondary | ICD-10-CM | POA: Diagnosis not present

## 2022-12-24 DIAGNOSIS — S98111S Complete traumatic amputation of right great toe, sequela: Secondary | ICD-10-CM | POA: Diagnosis not present

## 2022-12-24 DIAGNOSIS — E1165 Type 2 diabetes mellitus with hyperglycemia: Secondary | ICD-10-CM | POA: Diagnosis not present

## 2022-12-27 DIAGNOSIS — I129 Hypertensive chronic kidney disease with stage 1 through stage 4 chronic kidney disease, or unspecified chronic kidney disease: Secondary | ICD-10-CM | POA: Diagnosis not present

## 2022-12-27 DIAGNOSIS — E538 Deficiency of other specified B group vitamins: Secondary | ICD-10-CM | POA: Diagnosis not present

## 2022-12-27 DIAGNOSIS — M109 Gout, unspecified: Secondary | ICD-10-CM | POA: Diagnosis not present

## 2022-12-27 DIAGNOSIS — E1140 Type 2 diabetes mellitus with diabetic neuropathy, unspecified: Secondary | ICD-10-CM | POA: Diagnosis not present

## 2022-12-27 DIAGNOSIS — N184 Chronic kidney disease, stage 4 (severe): Secondary | ICD-10-CM | POA: Diagnosis not present

## 2022-12-27 DIAGNOSIS — M2031 Hallux varus (acquired), right foot: Secondary | ICD-10-CM | POA: Diagnosis not present

## 2022-12-27 DIAGNOSIS — E1122 Type 2 diabetes mellitus with diabetic chronic kidney disease: Secondary | ICD-10-CM | POA: Diagnosis not present

## 2023-01-01 DIAGNOSIS — I129 Hypertensive chronic kidney disease with stage 1 through stage 4 chronic kidney disease, or unspecified chronic kidney disease: Secondary | ICD-10-CM | POA: Diagnosis not present

## 2023-01-01 DIAGNOSIS — E1122 Type 2 diabetes mellitus with diabetic chronic kidney disease: Secondary | ICD-10-CM | POA: Diagnosis not present

## 2023-01-01 DIAGNOSIS — N184 Chronic kidney disease, stage 4 (severe): Secondary | ICD-10-CM | POA: Diagnosis not present

## 2023-01-01 DIAGNOSIS — E1140 Type 2 diabetes mellitus with diabetic neuropathy, unspecified: Secondary | ICD-10-CM | POA: Diagnosis not present

## 2023-01-01 DIAGNOSIS — E538 Deficiency of other specified B group vitamins: Secondary | ICD-10-CM | POA: Diagnosis not present

## 2023-01-01 DIAGNOSIS — M109 Gout, unspecified: Secondary | ICD-10-CM | POA: Diagnosis not present

## 2023-01-01 DIAGNOSIS — M2031 Hallux varus (acquired), right foot: Secondary | ICD-10-CM | POA: Diagnosis not present

## 2023-01-07 DIAGNOSIS — F330 Major depressive disorder, recurrent, mild: Secondary | ICD-10-CM | POA: Diagnosis not present

## 2023-01-07 DIAGNOSIS — G3184 Mild cognitive impairment, so stated: Secondary | ICD-10-CM | POA: Diagnosis not present

## 2023-01-07 DIAGNOSIS — G8929 Other chronic pain: Secondary | ICD-10-CM | POA: Diagnosis not present

## 2023-01-10 ENCOUNTER — Encounter (HOSPITAL_COMMUNITY): Payer: Self-pay

## 2023-01-10 ENCOUNTER — Other Ambulatory Visit: Payer: Self-pay

## 2023-01-10 ENCOUNTER — Emergency Department (HOSPITAL_COMMUNITY): Payer: PPO

## 2023-01-10 ENCOUNTER — Emergency Department (HOSPITAL_COMMUNITY)
Admission: EM | Admit: 2023-01-10 | Discharge: 2023-01-10 | Disposition: A | Discharge status: Home / Self Care | Payer: PPO | Attending: Emergency Medicine | Admitting: Emergency Medicine

## 2023-01-10 DIAGNOSIS — M25462 Effusion, left knee: Secondary | ICD-10-CM | POA: Diagnosis not present

## 2023-01-10 DIAGNOSIS — R739 Hyperglycemia, unspecified: Secondary | ICD-10-CM | POA: Diagnosis not present

## 2023-01-10 DIAGNOSIS — Y9301 Activity, walking, marching and hiking: Secondary | ICD-10-CM | POA: Insufficient documentation

## 2023-01-10 DIAGNOSIS — M799 Soft tissue disorder, unspecified: Secondary | ICD-10-CM | POA: Diagnosis not present

## 2023-01-10 DIAGNOSIS — W1830XA Fall on same level, unspecified, initial encounter: Secondary | ICD-10-CM | POA: Insufficient documentation

## 2023-01-10 DIAGNOSIS — M25562 Pain in left knee: Secondary | ICD-10-CM | POA: Diagnosis not present

## 2023-01-10 DIAGNOSIS — W19XXXA Unspecified fall, initial encounter: Secondary | ICD-10-CM | POA: Diagnosis not present

## 2023-01-10 DIAGNOSIS — S82022A Displaced longitudinal fracture of left patella, initial encounter for closed fracture: Secondary | ICD-10-CM | POA: Insufficient documentation

## 2023-01-10 DIAGNOSIS — R609 Edema, unspecified: Secondary | ICD-10-CM | POA: Diagnosis not present

## 2023-01-10 DIAGNOSIS — M85862 Other specified disorders of bone density and structure, left lower leg: Secondary | ICD-10-CM | POA: Diagnosis not present

## 2023-01-10 DIAGNOSIS — S82002A Unspecified fracture of left patella, initial encounter for closed fracture: Secondary | ICD-10-CM | POA: Diagnosis not present

## 2023-01-10 DIAGNOSIS — Y92002 Bathroom of unspecified non-institutional (private) residence single-family (private) house as the place of occurrence of the external cause: Secondary | ICD-10-CM | POA: Diagnosis not present

## 2023-01-10 DIAGNOSIS — Z7982 Long term (current) use of aspirin: Secondary | ICD-10-CM | POA: Insufficient documentation

## 2023-01-10 NOTE — ED Notes (Signed)
Attempted handoff x2 with facility. I was told the person I needed to give report to was not around. Call back number was left with the facility. Staff member was alerted that someone will need to meet the patient by the door to receive her. Writer was told to give transportation the number of [phone]

## 2023-01-10 NOTE — Progress Notes (Signed)
Orthopedic Tech Progress Note Patient Details:  Joy Tapia Jul 29, 1940 [id number]  Ortho Devices Type of Ortho Device: Knee Immobilizer Ortho Device/Splint Location: LLE Ortho Device/Splint Interventions: Application   Post Interventions Patient Tolerated: Well  Genelle Bal [name] 01/10/2023, 11:41 AM

## 2023-01-10 NOTE — ED Triage Notes (Signed)
Pt coming from [facility]. Pt had an unwitnessed fall this morning in the bathroom. Pt endorses hitting her left knee and elbow. Skin tear noted on elbow, and swelling in the knee. No deformity noted, did not hit head. Pt does take baby aspirin. Pt at cognitive baseline.

## 2023-01-10 NOTE — Discharge Instructions (Signed)
As discussed, you have been diagnosed with a patella fracture or kneecap fracture.  Typically these heal using the immobilization device provided.  However, it is importantly follow-up with her orthopedist in about 1 week ensure appropriate ongoing outpatient care.  Return here for concerning changes in your condition.

## 2023-01-10 NOTE — ED Provider Notes (Signed)
[place] EMERGENCY DEPARTMENT AT [facility] Provider Note   CSN: [id number] Arrival date & time: 01/10/23  0945     History  Chief Complaint  Patient presents with   Joy Tapia is a 82 y.o. female.  HPI Patient presents after mechanical fall with knee pain.  She denies pain anywhere other than the left knee.  She notes that she was walking, and fell.  Patient did not hit her head, has no head pain, weakness in any extremity, no initial notes suggest elbow issue, patient denies any elbow pain or discomfort. However, she has had pain walking in the left knee with swelling in that area as well.    Home Medications Prior to Admission medications   Medication Sig Start Date End Date Taking? Authorizing Provider  acetaminophen (TYLENOL) 325 MG tablet Take 650 mg by mouth every 8 (eight) hours as needed.    [provider]  aspirin EC 81 MG tablet Take 81 mg by mouth daily.    [provider]  cetirizine (ZYRTEC) 10 MG tablet Take 10 mg by mouth daily.    [provider]  Cyanocobalamin (VITAMIN B-12 IJ) Inject 1,000 mcg as directed every 30 (thirty) days.    [provider]  Febuxostat 80 MG TABS Take 1 tablet by mouth daily.    [provider]  fluticasone (FLONASE) 50 MCG/ACT nasal spray Place 1 spray into both nostrils daily as needed for allergies or rhinitis.    [provider]  furosemide (LASIX) 40 MG tablet Take 40 mg by mouth 2 (two) times daily.    [provider]  glimepiride (AMARYL) 2 MG tablet Take 2 mg by mouth daily with breakfast. 05/13/22   [provider]  metoprolol succinate (TOPROL-XL) 50 MG 24 hr tablet Take 50 mg by mouth daily.    [provider]  Multiple Vitamin (MULTIVITAMIN) capsule Take 1 capsule by mouth daily.    [provider]  omeprazole (PRILOSEC) 40 MG capsule Take 40 mg by mouth daily.    [provider]  pravastatin  (PRAVACHOL) 20 MG tablet Take 20 mg by mouth at bedtime.    [provider]      Allergies    Azithromycin    Review of Systems   Review of Systems  All other systems reviewed and are negative.   Physical Exam Updated Vital Signs BP (!) 147/53 (BP Location: Right Arm)   Pulse 70   Temp 98.6 F (37 C)   Resp 16   Ht 5' (1.524 m)   Wt 56.2 kg   SpO2 100%   BMI 24.22 kg/m  Physical Exam Vitals and nursing note reviewed.  Constitutional:      General: She is not in acute distress.    Appearance: She is well-developed.  HENT:     Head: Normocephalic and atraumatic.  Eyes:     Conjunctiva/sclera: Conjunctivae normal.  Cardiovascular:     Rate and Rhythm: Normal rate and regular rhythm.     Pulses: Normal pulses.  Pulmonary:     Effort: Pulmonary effort is normal. No respiratory distress.     Breath sounds: Normal breath sounds. No stridor.  Abdominal:     General: There is no distension.  Musculoskeletal:       Legs:  Skin:    General: Skin is warm and dry.  Neurological:     Mental Status: She is alert and oriented to person, place, and  time.     Cranial Nerves: No cranial nerve deficit.  Psychiatric:        Mood and Affect: Mood normal.     ED Results / Procedures / Treatments   Labs (all labs ordered are listed, but only abnormal results are displayed) Labs Reviewed - No data to display  EKG None  Radiology DG Knee AP/LAT W/Sunrise Left  Result Date: 01/10/2023 CLINICAL DATA:  Fall.  Pain EXAM: LEFT KNEE 3 VIEWS COMPARISON:  None Available. FINDINGS: There is a vertically oriented nondisplaced fracture of the patella along its lateral margin. Adjacent severe soft tissue thickening and a moderate joint effusion. No additional fracture or dislocation. Osteopenia. IMPRESSION: Nondisplaced vertically oriented fracture along the lateral aspect of the patella. Adjacent soft tissue swelling and joint effusion Electronically Signed   By: Karen Kays M.D.    On: 01/10/2023 11:01    Procedures Procedures    Medications Ordered in ED Medications - No data to display  ED Course/ Medical Decision Making/ A&P                                 Medical Decision Making Elderly female presents after mechanical fall with knee pain.  Patient is awake, alert, hemodynamically unremarkable, has no substantial skin wound requiring repair, but with concern for soft tissue injury versus fracture x-ray was performed, this is notable for a vertically oriented patella fracture.  Patient had immobilization provided with the assistance of her orthopedic technician, was discharged with a walker that she has at home to follow-up with orthopedics.  Amount and/or Complexity of Data Reviewed Radiology: ordered and independent interpretation performed. Decision-making details documented in ED Course.  Risk Decision regarding hospitalization.  Final Clinical Impression(s) / ED Diagnoses Final diagnoses:  Fall, initial encounter  Closed displaced longitudinal fracture of left patella, initial encounter    Rx / DC Orders ED Discharge Orders     None         Gerhard Munch, MD 01/10/23 1225

## 2023-01-15 DIAGNOSIS — I129 Hypertensive chronic kidney disease with stage 1 through stage 4 chronic kidney disease, or unspecified chronic kidney disease: Secondary | ICD-10-CM | POA: Diagnosis not present

## 2023-01-15 DIAGNOSIS — S82022A Displaced longitudinal fracture of left patella, initial encounter for closed fracture: Secondary | ICD-10-CM | POA: Diagnosis not present

## 2023-01-15 DIAGNOSIS — N1832 Chronic kidney disease, stage 3b: Secondary | ICD-10-CM | POA: Diagnosis not present

## 2023-01-15 DIAGNOSIS — W010XXA Fall on same level from slipping, tripping and stumbling without subsequent striking against object, initial encounter: Secondary | ICD-10-CM | POA: Diagnosis not present

## 2023-01-15 DIAGNOSIS — Z79899 Other long term (current) drug therapy: Secondary | ICD-10-CM | POA: Diagnosis not present

## 2023-01-16 ENCOUNTER — Telehealth: Payer: Self-pay | Admitting: *Deleted

## 2023-01-16 DIAGNOSIS — M25562 Pain in left knee: Secondary | ICD-10-CM | POA: Diagnosis not present

## 2023-01-16 NOTE — Telephone Encounter (Signed)
Transition Care Management Unsuccessful Follow-up Telephone Call  Date of discharge and from where:  [facility]  01/10/2023  Attempts:  1st Attempt  Reason for unsuccessful TCM follow-up call:  No answer/busy

## 2023-01-20 ENCOUNTER — Telehealth: Payer: Self-pay | Admitting: *Deleted

## 2023-01-20 NOTE — Telephone Encounter (Signed)
Transition Care Management Unsuccessful Follow-up Telephone Call  Date of discharge and from where:  [facility] 01/10/2023 Attempts:  2nd Attempt  Reason for unsuccessful TCM follow-up call:  patient in assisted living

## 2023-01-24 DIAGNOSIS — F330 Major depressive disorder, recurrent, mild: Secondary | ICD-10-CM | POA: Diagnosis not present

## 2023-01-24 DIAGNOSIS — I129 Hypertensive chronic kidney disease with stage 1 through stage 4 chronic kidney disease, or unspecified chronic kidney disease: Secondary | ICD-10-CM | POA: Diagnosis not present

## 2023-01-24 DIAGNOSIS — E1165 Type 2 diabetes mellitus with hyperglycemia: Secondary | ICD-10-CM | POA: Diagnosis not present

## 2023-01-28 DIAGNOSIS — N184 Chronic kidney disease, stage 4 (severe): Secondary | ICD-10-CM | POA: Diagnosis not present

## 2023-01-28 DIAGNOSIS — S82002D Unspecified fracture of left patella, subsequent encounter for closed fracture with routine healing: Secondary | ICD-10-CM | POA: Diagnosis not present

## 2023-01-28 DIAGNOSIS — Z9181 History of falling: Secondary | ICD-10-CM | POA: Diagnosis not present

## 2023-01-28 DIAGNOSIS — J309 Allergic rhinitis, unspecified: Secondary | ICD-10-CM | POA: Diagnosis not present

## 2023-01-28 DIAGNOSIS — K219 Gastro-esophageal reflux disease without esophagitis: Secondary | ICD-10-CM | POA: Diagnosis not present

## 2023-01-28 DIAGNOSIS — E1122 Type 2 diabetes mellitus with diabetic chronic kidney disease: Secondary | ICD-10-CM | POA: Diagnosis not present

## 2023-01-28 DIAGNOSIS — I129 Hypertensive chronic kidney disease with stage 1 through stage 4 chronic kidney disease, or unspecified chronic kidney disease: Secondary | ICD-10-CM | POA: Diagnosis not present

## 2023-01-28 DIAGNOSIS — E790 Hyperuricemia without signs of inflammatory arthritis and tophaceous disease: Secondary | ICD-10-CM | POA: Diagnosis not present

## 2023-01-28 DIAGNOSIS — E782 Mixed hyperlipidemia: Secondary | ICD-10-CM | POA: Diagnosis not present

## 2023-02-05 DIAGNOSIS — S82045D Nondisplaced comminuted fracture of left patella, subsequent encounter for closed fracture with routine healing: Secondary | ICD-10-CM | POA: Diagnosis not present

## 2023-02-12 DIAGNOSIS — K219 Gastro-esophageal reflux disease without esophagitis: Secondary | ICD-10-CM | POA: Diagnosis not present

## 2023-02-12 DIAGNOSIS — N184 Chronic kidney disease, stage 4 (severe): Secondary | ICD-10-CM | POA: Diagnosis not present

## 2023-02-12 DIAGNOSIS — I129 Hypertensive chronic kidney disease with stage 1 through stage 4 chronic kidney disease, or unspecified chronic kidney disease: Secondary | ICD-10-CM | POA: Diagnosis not present

## 2023-02-12 DIAGNOSIS — S82002D Unspecified fracture of left patella, subsequent encounter for closed fracture with routine healing: Secondary | ICD-10-CM | POA: Diagnosis not present

## 2023-02-17 DIAGNOSIS — F330 Major depressive disorder, recurrent, mild: Secondary | ICD-10-CM | POA: Diagnosis not present

## 2023-02-21 DIAGNOSIS — E1165 Type 2 diabetes mellitus with hyperglycemia: Secondary | ICD-10-CM | POA: Diagnosis not present

## 2023-02-21 DIAGNOSIS — I129 Hypertensive chronic kidney disease with stage 1 through stage 4 chronic kidney disease, or unspecified chronic kidney disease: Secondary | ICD-10-CM | POA: Diagnosis not present

## 2023-02-24 DIAGNOSIS — F330 Major depressive disorder, recurrent, mild: Secondary | ICD-10-CM | POA: Diagnosis not present

## 2023-02-26 DIAGNOSIS — I129 Hypertensive chronic kidney disease with stage 1 through stage 4 chronic kidney disease, or unspecified chronic kidney disease: Secondary | ICD-10-CM | POA: Diagnosis not present

## 2023-02-26 DIAGNOSIS — E1122 Type 2 diabetes mellitus with diabetic chronic kidney disease: Secondary | ICD-10-CM | POA: Diagnosis not present

## 2023-02-26 DIAGNOSIS — N184 Chronic kidney disease, stage 4 (severe): Secondary | ICD-10-CM | POA: Diagnosis not present

## 2023-02-28 DIAGNOSIS — G3184 Mild cognitive impairment, so stated: Secondary | ICD-10-CM | POA: Diagnosis not present

## 2023-02-28 DIAGNOSIS — G8929 Other chronic pain: Secondary | ICD-10-CM | POA: Diagnosis not present

## 2023-02-28 DIAGNOSIS — F330 Major depressive disorder, recurrent, mild: Secondary | ICD-10-CM | POA: Diagnosis not present

## 2023-03-05 DIAGNOSIS — S82009A Unspecified fracture of unspecified patella, initial encounter for closed fracture: Secondary | ICD-10-CM | POA: Insufficient documentation

## 2023-03-06 DIAGNOSIS — S82002D Unspecified fracture of left patella, subsequent encounter for closed fracture with routine healing: Secondary | ICD-10-CM | POA: Diagnosis not present

## 2023-03-12 DIAGNOSIS — I129 Hypertensive chronic kidney disease with stage 1 through stage 4 chronic kidney disease, or unspecified chronic kidney disease: Secondary | ICD-10-CM | POA: Diagnosis not present

## 2023-03-12 DIAGNOSIS — N184 Chronic kidney disease, stage 4 (severe): Secondary | ICD-10-CM | POA: Diagnosis not present

## 2023-03-12 DIAGNOSIS — S82002D Unspecified fracture of left patella, subsequent encounter for closed fracture with routine healing: Secondary | ICD-10-CM | POA: Diagnosis not present

## 2023-03-12 DIAGNOSIS — K219 Gastro-esophageal reflux disease without esophagitis: Secondary | ICD-10-CM | POA: Diagnosis not present

## 2023-03-24 DIAGNOSIS — F330 Major depressive disorder, recurrent, mild: Secondary | ICD-10-CM | POA: Diagnosis not present

## 2023-03-27 DIAGNOSIS — E1165 Type 2 diabetes mellitus with hyperglycemia: Secondary | ICD-10-CM | POA: Diagnosis not present

## 2023-03-27 DIAGNOSIS — I129 Hypertensive chronic kidney disease with stage 1 through stage 4 chronic kidney disease, or unspecified chronic kidney disease: Secondary | ICD-10-CM | POA: Diagnosis not present

## 2023-03-28 DIAGNOSIS — G8929 Other chronic pain: Secondary | ICD-10-CM | POA: Diagnosis not present

## 2023-03-28 DIAGNOSIS — G3184 Mild cognitive impairment, so stated: Secondary | ICD-10-CM | POA: Diagnosis not present

## 2023-03-28 DIAGNOSIS — F330 Major depressive disorder, recurrent, mild: Secondary | ICD-10-CM | POA: Diagnosis not present

## 2023-04-03 DIAGNOSIS — N1832 Chronic kidney disease, stage 3b: Secondary | ICD-10-CM | POA: Diagnosis not present

## 2023-04-03 DIAGNOSIS — E1122 Type 2 diabetes mellitus with diabetic chronic kidney disease: Secondary | ICD-10-CM | POA: Diagnosis not present

## 2023-04-03 DIAGNOSIS — D519 Vitamin B12 deficiency anemia, unspecified: Secondary | ICD-10-CM | POA: Diagnosis not present

## 2023-04-09 DIAGNOSIS — E1122 Type 2 diabetes mellitus with diabetic chronic kidney disease: Secondary | ICD-10-CM | POA: Diagnosis not present

## 2023-04-09 DIAGNOSIS — K219 Gastro-esophageal reflux disease without esophagitis: Secondary | ICD-10-CM | POA: Diagnosis not present

## 2023-04-09 DIAGNOSIS — N1832 Chronic kidney disease, stage 3b: Secondary | ICD-10-CM | POA: Diagnosis not present

## 2023-04-09 DIAGNOSIS — J309 Allergic rhinitis, unspecified: Secondary | ICD-10-CM | POA: Diagnosis not present

## 2023-04-14 DIAGNOSIS — I129 Hypertensive chronic kidney disease with stage 1 through stage 4 chronic kidney disease, or unspecified chronic kidney disease: Secondary | ICD-10-CM | POA: Diagnosis not present

## 2023-04-14 DIAGNOSIS — N184 Chronic kidney disease, stage 4 (severe): Secondary | ICD-10-CM | POA: Diagnosis not present

## 2023-04-14 DIAGNOSIS — E1122 Type 2 diabetes mellitus with diabetic chronic kidney disease: Secondary | ICD-10-CM | POA: Diagnosis not present

## 2023-04-14 DIAGNOSIS — K219 Gastro-esophageal reflux disease without esophagitis: Secondary | ICD-10-CM | POA: Diagnosis not present

## 2023-04-21 DIAGNOSIS — F330 Major depressive disorder, recurrent, mild: Secondary | ICD-10-CM | POA: Diagnosis not present

## 2023-04-23 DIAGNOSIS — G3184 Mild cognitive impairment, so stated: Secondary | ICD-10-CM | POA: Diagnosis not present

## 2023-04-23 DIAGNOSIS — G8929 Other chronic pain: Secondary | ICD-10-CM | POA: Diagnosis not present

## 2023-04-23 DIAGNOSIS — F330 Major depressive disorder, recurrent, mild: Secondary | ICD-10-CM | POA: Diagnosis not present

## 2023-04-26 ENCOUNTER — Other Ambulatory Visit: Payer: Self-pay

## 2023-04-26 ENCOUNTER — Emergency Department (HOSPITAL_COMMUNITY): Payer: PPO

## 2023-04-26 ENCOUNTER — Inpatient Hospital Stay (HOSPITAL_COMMUNITY)
Admission: EM | Admit: 2023-04-26 | Discharge: 2023-04-29 | DRG: 872 | Disposition: A | Discharge status: Nursing Home (Includes ICF) | Payer: PPO | Source: Skilled Nursing Facility | Attending: Internal Medicine | Admitting: Internal Medicine

## 2023-04-26 ENCOUNTER — Encounter (HOSPITAL_COMMUNITY): Payer: Self-pay

## 2023-04-26 DIAGNOSIS — R609 Edema, unspecified: Secondary | ICD-10-CM | POA: Diagnosis not present

## 2023-04-26 DIAGNOSIS — Z794 Long term (current) use of insulin: Secondary | ICD-10-CM

## 2023-04-26 DIAGNOSIS — I2510 Atherosclerotic heart disease of native coronary artery without angina pectoris: Secondary | ICD-10-CM | POA: Diagnosis present

## 2023-04-26 DIAGNOSIS — Z1152 Encounter for screening for COVID-19: Secondary | ICD-10-CM | POA: Diagnosis not present

## 2023-04-26 DIAGNOSIS — E1165 Type 2 diabetes mellitus with hyperglycemia: Secondary | ICD-10-CM | POA: Diagnosis not present

## 2023-04-26 DIAGNOSIS — L03031 Cellulitis of right toe: Secondary | ICD-10-CM

## 2023-04-26 DIAGNOSIS — E1122 Type 2 diabetes mellitus with diabetic chronic kidney disease: Secondary | ICD-10-CM | POA: Diagnosis present

## 2023-04-26 DIAGNOSIS — Z881 Allergy status to other antibiotic agents status: Secondary | ICD-10-CM | POA: Diagnosis not present

## 2023-04-26 DIAGNOSIS — K219 Gastro-esophageal reflux disease without esophagitis: Secondary | ICD-10-CM | POA: Diagnosis not present

## 2023-04-26 DIAGNOSIS — E876 Hypokalemia: Secondary | ICD-10-CM | POA: Diagnosis present

## 2023-04-26 DIAGNOSIS — N179 Acute kidney failure, unspecified: Secondary | ICD-10-CM | POA: Diagnosis present

## 2023-04-26 DIAGNOSIS — Z86718 Personal history of other venous thrombosis and embolism: Secondary | ICD-10-CM

## 2023-04-26 DIAGNOSIS — F0390 Unspecified dementia without behavioral disturbance: Secondary | ICD-10-CM | POA: Diagnosis not present

## 2023-04-26 DIAGNOSIS — Z7984 Long term (current) use of oral hypoglycemic drugs: Secondary | ICD-10-CM

## 2023-04-26 DIAGNOSIS — E785 Hyperlipidemia, unspecified: Secondary | ICD-10-CM | POA: Diagnosis not present

## 2023-04-26 DIAGNOSIS — I129 Hypertensive chronic kidney disease with stage 1 through stage 4 chronic kidney disease, or unspecified chronic kidney disease: Secondary | ICD-10-CM | POA: Diagnosis present

## 2023-04-26 DIAGNOSIS — Z79899 Other long term (current) drug therapy: Secondary | ICD-10-CM

## 2023-04-26 DIAGNOSIS — E11628 Type 2 diabetes mellitus with other skin complications: Secondary | ICD-10-CM | POA: Diagnosis not present

## 2023-04-26 DIAGNOSIS — E872 Acidosis, unspecified: Secondary | ICD-10-CM | POA: Diagnosis present

## 2023-04-26 DIAGNOSIS — A419 Sepsis, unspecified organism: Principal | ICD-10-CM | POA: Diagnosis present

## 2023-04-26 DIAGNOSIS — M810 Age-related osteoporosis without current pathological fracture: Secondary | ICD-10-CM | POA: Diagnosis present

## 2023-04-26 DIAGNOSIS — S93124A Dislocation of metatarsophalangeal joint of right lesser toe(s), initial encounter: Secondary | ICD-10-CM | POA: Diagnosis not present

## 2023-04-26 DIAGNOSIS — Z89411 Acquired absence of right great toe: Secondary | ICD-10-CM | POA: Diagnosis not present

## 2023-04-26 DIAGNOSIS — N184 Chronic kidney disease, stage 4 (severe): Secondary | ICD-10-CM | POA: Diagnosis present

## 2023-04-26 DIAGNOSIS — I10 Essential (primary) hypertension: Secondary | ICD-10-CM | POA: Diagnosis not present

## 2023-04-26 DIAGNOSIS — Z87891 Personal history of nicotine dependence: Secondary | ICD-10-CM

## 2023-04-26 DIAGNOSIS — M109 Gout, unspecified: Secondary | ICD-10-CM | POA: Diagnosis present

## 2023-04-26 DIAGNOSIS — Z888 Allergy status to other drugs, medicaments and biological substances status: Secondary | ICD-10-CM | POA: Diagnosis not present

## 2023-04-26 DIAGNOSIS — R509 Fever, unspecified: Secondary | ICD-10-CM | POA: Diagnosis not present

## 2023-04-26 DIAGNOSIS — L97519 Non-pressure chronic ulcer of other part of right foot with unspecified severity: Secondary | ICD-10-CM | POA: Diagnosis not present

## 2023-04-26 DIAGNOSIS — L0390 Cellulitis, unspecified: Principal | ICD-10-CM | POA: Diagnosis present

## 2023-04-26 DIAGNOSIS — R4189 Other symptoms and signs involving cognitive functions and awareness: Secondary | ICD-10-CM | POA: Diagnosis present

## 2023-04-26 DIAGNOSIS — Z7982 Long term (current) use of aspirin: Secondary | ICD-10-CM

## 2023-04-26 DIAGNOSIS — R739 Hyperglycemia, unspecified: Secondary | ICD-10-CM | POA: Diagnosis not present

## 2023-04-26 DIAGNOSIS — Z0389 Encounter for observation for other suspected diseases and conditions ruled out: Secondary | ICD-10-CM | POA: Diagnosis not present

## 2023-04-26 DIAGNOSIS — I959 Hypotension, unspecified: Secondary | ICD-10-CM | POA: Diagnosis not present

## 2023-04-26 DIAGNOSIS — L03115 Cellulitis of right lower limb: Secondary | ICD-10-CM | POA: Diagnosis not present

## 2023-04-26 DIAGNOSIS — E11621 Type 2 diabetes mellitus with foot ulcer: Secondary | ICD-10-CM | POA: Diagnosis present

## 2023-04-26 DIAGNOSIS — Z96651 Presence of right artificial knee joint: Secondary | ICD-10-CM | POA: Diagnosis present

## 2023-04-26 LAB — CBC WITH DIFFERENTIAL/PLATELET
Abs Immature Granulocytes: 0.05 10*3/uL (ref 0.00–0.07)
Basophils Absolute: 0 10*3/uL (ref 0.0–0.1)
Basophils Relative: 0 %
Eosinophils Absolute: 0.1 10*3/uL (ref 0.0–0.5)
Eosinophils Relative: 1 %
HCT: 37.8 % (ref 36.0–46.0)
Hemoglobin: 12.6 g/dL (ref 12.0–15.0)
Immature Granulocytes: 0 %
Lymphocytes Relative: 8 %
Lymphs Abs: 1.1 10*3/uL (ref 0.7–4.0)
MCH: 29.2 pg (ref 26.0–34.0)
MCHC: 33.3 g/dL (ref 30.0–36.0)
MCV: 87.5 fL (ref 80.0–100.0)
Monocytes Absolute: 0.9 10*3/uL (ref 0.1–1.0)
Monocytes Relative: 7 %
Neutro Abs: 10.8 10*3/uL — ABNORMAL HIGH (ref 1.7–7.7)
Neutrophils Relative %: 84 %
Platelets: 235 10*3/uL (ref 150–400)
RBC: 4.32 MIL/uL (ref 3.87–5.11)
RDW: 14.4 % (ref 11.5–15.5)
WBC: 12.9 10*3/uL — ABNORMAL HIGH (ref 4.0–10.5)
nRBC: 0 % (ref 0.0–0.2)

## 2023-04-26 LAB — COMPREHENSIVE METABOLIC PANEL
ALT: 23 U/L (ref 0–44)
AST: 26 U/L (ref 15–41)
Albumin: 2.7 g/dL — ABNORMAL LOW (ref 3.5–5.0)
Alkaline Phosphatase: 88 U/L (ref 38–126)
Anion gap: 11 (ref 5–15)
BUN: 31 mg/dL — ABNORMAL HIGH (ref 8–23)
CO2: 21 mmol/L — ABNORMAL LOW (ref 22–32)
Calcium: 7.6 mg/dL — ABNORMAL LOW (ref 8.9–10.3)
Chloride: 98 mmol/L (ref 98–111)
Creatinine, Ser: 1.79 mg/dL — ABNORMAL HIGH (ref 0.44–1.00)
GFR, Estimated: 28 mL/min — ABNORMAL LOW (ref >60)
Glucose, Bld: 389 mg/dL — ABNORMAL HIGH (ref 70–99)
Potassium: 3.5 mmol/L (ref 3.5–5.1)
Sodium: 130 mmol/L — ABNORMAL LOW (ref 135–145)
Total Bilirubin: 0.3 mg/dL (ref <1.2)
Total Protein: 6 g/dL — ABNORMAL LOW (ref 6.5–8.1)

## 2023-04-26 LAB — CBG MONITORING, ED: Glucose-Capillary: 344 mg/dL — ABNORMAL HIGH (ref 70–99)

## 2023-04-26 LAB — GLUCOSE, CAPILLARY: Glucose-Capillary: 232 mg/dL — ABNORMAL HIGH (ref 70–99)

## 2023-04-26 LAB — CBC
HCT: 35.5 % — ABNORMAL LOW (ref 36.0–46.0)
Hemoglobin: 11.9 g/dL — ABNORMAL LOW (ref 12.0–15.0)
MCH: 29 pg (ref 26.0–34.0)
MCHC: 33.5 g/dL (ref 30.0–36.0)
MCV: 86.6 fL (ref 80.0–100.0)
Platelets: 229 10*3/uL (ref 150–400)
RBC: 4.1 MIL/uL (ref 3.87–5.11)
RDW: 14.3 % (ref 11.5–15.5)
WBC: 13.8 10*3/uL — ABNORMAL HIGH (ref 4.0–10.5)
nRBC: 0 % (ref 0.0–0.2)

## 2023-04-26 LAB — CREATININE, SERUM
Creatinine, Ser: 1.74 mg/dL — ABNORMAL HIGH (ref 0.44–1.00)
GFR, Estimated: 29 mL/min — ABNORMAL LOW (ref >60)

## 2023-04-26 LAB — PROTIME-INR
INR: 1.1 (ref 0.8–1.2)
Prothrombin Time: 14.3 s (ref 11.4–15.2)

## 2023-04-26 LAB — APTT: aPTT: 24 s (ref 24–36)

## 2023-04-26 LAB — I-STAT CG4 LACTIC ACID, ED
Lactic Acid, Venous: 2.9 mmol/L (ref 0.5–1.9)
Lactic Acid, Venous: 1.9 mmol/L (ref 0.5–1.9)

## 2023-04-26 LAB — SARS CORONAVIRUS 2 BY RT PCR: SARS Coronavirus 2 by RT PCR: NEGATIVE

## 2023-04-26 MED ORDER — GUAIFENESIN-DM 100-10 MG/5ML PO SYRP
5.0000 mL | ORAL_SOLUTION | ORAL | Status: DC | PRN
  Administered 2023-04-26 – 2023-04-27 (×2): 5 mL via ORAL
  Filled 2023-04-26 (×2): qty 10

## 2023-04-26 MED ORDER — PANTOPRAZOLE SODIUM 40 MG PO TBEC
40.0000 mg | DELAYED_RELEASE_TABLET | Freq: Every day | ORAL | Status: DC
  Administered 2023-04-27 – 2023-04-29 (×3): 40 mg via ORAL
  Filled 2023-04-26 (×3): qty 1

## 2023-04-26 MED ORDER — VANCOMYCIN HCL IN DEXTROSE 1-5 GM/200ML-% IV SOLN
1000.0000 mg | Freq: Once | INTRAVENOUS | Status: AC
  Administered 2023-04-26: 1000 mg via INTRAVENOUS
  Filled 2023-04-26: qty 200

## 2023-04-26 MED ORDER — OXYCODONE HCL 5 MG PO TABS: 5.0000 mg | ORAL_TABLET | ORAL | Status: DC | PRN

## 2023-04-26 MED ORDER — ONDANSETRON HCL 4 MG/2ML IJ SOLN
4.0000 mg | Freq: Four times a day (QID) | INTRAMUSCULAR | Status: DC | PRN
  Administered 2023-04-27: 4 mg via INTRAVENOUS
  Filled 2023-04-26: qty 2

## 2023-04-26 MED ORDER — ACETAMINOPHEN 325 MG PO TABS
650.0000 mg | ORAL_TABLET | Freq: Four times a day (QID) | ORAL | Status: DC | PRN
  Administered 2023-04-27 – 2023-04-28 (×4): 650 mg via ORAL
  Filled 2023-04-26 (×4): qty 2

## 2023-04-26 MED ORDER — METRONIDAZOLE 500 MG/100ML IV SOLN
500.0000 mg | Freq: Two times a day (BID) | INTRAVENOUS | Status: DC
  Administered 2023-04-26: 500 mg via INTRAVENOUS
  Filled 2023-04-26: qty 100

## 2023-04-26 MED ORDER — SODIUM CHLORIDE 0.9 % IV SOLN
2.0000 g | INTRAVENOUS | Status: DC
  Administered 2023-04-26: 2 g via INTRAVENOUS
  Filled 2023-04-26: qty 20

## 2023-04-26 MED ORDER — SODIUM CHLORIDE 0.9 % IV BOLUS (SEPSIS)
700.0000 mL | Freq: Once | INTRAVENOUS | Status: AC
  Administered 2023-04-26: 700 mL via INTRAVENOUS

## 2023-04-26 MED ORDER — SODIUM CHLORIDE 0.9 % IV BOLUS
1000.0000 mL | Freq: Once | INTRAVENOUS | Status: AC
  Administered 2023-04-26: 1000 mL via INTRAVENOUS

## 2023-04-26 MED ORDER — BENZONATATE 100 MG PO CAPS
100.0000 mg | ORAL_CAPSULE | Freq: Once | ORAL | Status: AC
  Administered 2023-04-26: 100 mg via ORAL
  Filled 2023-04-26: qty 1

## 2023-04-26 MED ORDER — LACTATED RINGERS IV SOLN: INTRAVENOUS | Status: AC

## 2023-04-26 MED ORDER — METOPROLOL SUCCINATE ER 50 MG PO TB24: 50.0000 mg | ORAL_TABLET | Freq: Every day | ORAL | Status: DC

## 2023-04-26 MED ORDER — PIPERACILLIN-TAZOBACTAM 3.375 G IVPB
3.3750 g | Freq: Three times a day (TID) | INTRAVENOUS | Status: DC
  Administered 2023-04-27: 3.375 g via INTRAVENOUS
  Filled 2023-04-26: qty 50

## 2023-04-26 MED ORDER — INSULIN ASPART 100 UNIT/ML IJ SOLN
0.0000 [IU] | INTRAMUSCULAR | Status: DC
  Administered 2023-04-26: 16 [IU] via SUBCUTANEOUS
  Administered 2023-04-26: 8 [IU] via SUBCUTANEOUS
  Administered 2023-04-27: 4 [IU] via SUBCUTANEOUS
  Administered 2023-04-27: 2 [IU] via SUBCUTANEOUS
  Administered 2023-04-27: 8 [IU] via SUBCUTANEOUS
  Administered 2023-04-28 (×2): 2 [IU] via SUBCUTANEOUS
  Administered 2023-04-28: 8 [IU] via SUBCUTANEOUS
  Administered 2023-04-29: 2 [IU] via SUBCUTANEOUS
  Administered 2023-04-29: 8 [IU] via SUBCUTANEOUS
  Administered 2023-04-29: 4 [IU] via SUBCUTANEOUS
  Filled 2023-04-26: qty 0.24

## 2023-04-26 MED ORDER — ASPIRIN 81 MG PO TBEC
81.0000 mg | DELAYED_RELEASE_TABLET | Freq: Every day | ORAL | Status: DC
  Administered 2023-04-27 – 2023-04-29 (×3): 81 mg via ORAL
  Filled 2023-04-26 (×3): qty 1

## 2023-04-26 MED ORDER — ONDANSETRON HCL 4 MG PO TABS: 4.0000 mg | ORAL_TABLET | Freq: Four times a day (QID) | ORAL | Status: DC | PRN

## 2023-04-26 MED ORDER — PIPERACILLIN-TAZOBACTAM 3.375 G IVPB 30 MIN
3.3750 g | Freq: Once | INTRAVENOUS | Status: AC
  Administered 2023-04-26: 3.375 g via INTRAVENOUS
  Filled 2023-04-26: qty 50

## 2023-04-26 MED ORDER — INSULIN DETEMIR 100 UNIT/ML ~~LOC~~ SOLN
12.0000 [IU] | Freq: Every day | SUBCUTANEOUS | Status: DC
  Administered 2023-04-26 – 2023-04-29 (×4): 12 [IU] via SUBCUTANEOUS
  Filled 2023-04-26 (×4): qty 0.12

## 2023-04-26 MED ORDER — ACETAMINOPHEN 650 MG RE SUPP: 650.0000 mg | Freq: Four times a day (QID) | RECTAL | Status: DC | PRN

## 2023-04-26 MED ORDER — ENOXAPARIN SODIUM 30 MG/0.3ML IJ SOSY
30.0000 mg | PREFILLED_SYRINGE | INTRAMUSCULAR | Status: DC
  Administered 2023-04-26 – 2023-04-28 (×3): 30 mg via SUBCUTANEOUS
  Filled 2023-04-26 (×3): qty 0.3

## 2023-04-26 MED ORDER — ACETAMINOPHEN 325 MG PO TABS
650.0000 mg | ORAL_TABLET | Freq: Once | ORAL | Status: AC
  Administered 2023-04-26: 650 mg via ORAL
  Filled 2023-04-26: qty 2

## 2023-04-26 MED ORDER — PRAVASTATIN SODIUM 20 MG PO TABS
20.0000 mg | ORAL_TABLET | Freq: Every day | ORAL | Status: DC
  Administered 2023-04-26 – 2023-04-28 (×3): 20 mg via ORAL
  Filled 2023-04-26 (×3): qty 1

## 2023-04-26 NOTE — Sepsis Progress Note (Signed)
Sepsis protocol is being followed by [facility]. 

## 2023-04-26 NOTE — ED Provider Notes (Signed)
[place] EMERGENCY DEPARTMENT AT [facility] Provider Note   CSN: [id number] Arrival date & time: 04/26/23  1734     History {Add pertinent medical, surgical, social history, OB history to HPI:1} Chief Complaint  Patient presents with   Fever    Joy Tapia is a 82 y.o. female.  Level 5 caveat secondary to dementia.  She has a history of foot fracture and osteomyelitis in that foot and has had an amputation.  She said it flares up from time to time.  She was sent in from her facility for redness of her foot and fever.  Unclear how long this has been going on.  She denies any pain there any nausea or vomiting.  She said she feels a little congestion in her throat.  The history is provided by the patient.  Fever Max temp prior to arrival:  101 Temp source:  Oral Chronicity:  New Relieved by:  None tried Associated symptoms: congestion   Associated symptoms: no chest pain, no chills, no cough, no diarrhea, no dysuria, no nausea and no vomiting        Home Medications Prior to Admission medications   Medication Sig Start Date End Date Taking? Authorizing Provider  acetaminophen (TYLENOL) 325 MG tablet Take 650 mg by mouth every 8 (eight) hours as needed.    [provider]  aspirin EC 81 MG tablet Take 81 mg by mouth daily.    [provider]  cetirizine (ZYRTEC) 10 MG tablet Take 10 mg by mouth daily.    [provider]  Cyanocobalamin (VITAMIN B-12 IJ) Inject 1,000 mcg as directed every 30 (thirty) days.    [provider]  Febuxostat 80 MG TABS Take 1 tablet by mouth daily.    [provider]  fluticasone (FLONASE) 50 MCG/ACT nasal spray Place 1 spray into both nostrils daily as needed for allergies or rhinitis.    [provider]  furosemide (LASIX) 40 MG tablet Take 40 mg by mouth 2 (two) times daily.    [provider]  glimepiride (AMARYL) 2 MG tablet Take 2 mg by mouth daily with breakfast.  05/13/22   [provider]  metoprolol succinate (TOPROL-XL) 50 MG 24 hr tablet Take 50 mg by mouth daily.    [provider]  Multiple Vitamin (MULTIVITAMIN) capsule Take 1 capsule by mouth daily.    [provider]  omeprazole (PRILOSEC) 40 MG capsule Take 40 mg by mouth daily.    [provider]  pravastatin (PRAVACHOL) 20 MG tablet Take 20 mg by mouth at bedtime.    [provider]      Allergies    Atorvastatin, Azithromycin, Erythromycin, and Propoxyphene    Review of Systems   Review of Systems  Constitutional:  Positive for fever. Negative for chills.  HENT:  Positive for congestion.   Respiratory:  Negative for cough.   Cardiovascular:  Negative for chest pain.  Gastrointestinal:  Negative for diarrhea, nausea and vomiting.  Genitourinary:  Negative for dysuria.    Physical Exam Updated Vital Signs There were no vitals taken for this visit. Physical Exam Vitals and nursing note reviewed.  Constitutional:      General: She is not in acute distress.    Appearance: Normal appearance. She is well-developed.  HENT:     Head: Normocephalic and atraumatic.  Eyes:     Conjunctiva/sclera: Conjunctivae normal.  Cardiovascular:     Rate and Rhythm: Normal rate and regular rhythm.  Heart sounds: No murmur heard. Pulmonary:     Effort: Pulmonary effort is normal. No respiratory distress.     Breath sounds: Normal breath sounds.  Abdominal:     Palpations: Abdomen is soft.     Tenderness: There is no abdominal tenderness. There is no guarding or rebound.  Musculoskeletal:        General: Swelling and tenderness present.     Cervical back: Neck supple.     Comments: Her right foot has excision of great toe.  There is a tender callus on the plantar surface of her foot.  There is cellulitis across her foot extending up her lower leg.  There are some edema there.  Skin:    General: Skin is warm and dry.     Capillary Refill:  Capillary refill takes less than 2 seconds.     Findings: Erythema present.  Neurological:     General: No focal deficit present.     Mental Status: She is alert. She is disoriented.     Motor: No weakness.     ED Results / Procedures / Treatments   Labs (all labs ordered are listed, but only abnormal results are displayed) Labs Reviewed  CULTURE, BLOOD (ROUTINE X 2)  CULTURE, BLOOD (ROUTINE X 2)  COMPREHENSIVE METABOLIC PANEL  CBC WITH DIFFERENTIAL/PLATELET  PROTIME-INR  I-STAT CG4 LACTIC ACID, ED    EKG None  Radiology No results found.  Procedures Procedures  {Document cardiac monitor, telemetry assessment procedure when appropriate:1}  Medications Ordered in ED Medications  acetaminophen (TYLENOL) tablet 650 mg (has no administration in time range)  sodium chloride 0.9 % bolus 1,000 mL (has no administration in time range)    ED Course/ Medical Decision Making/ A&P   {   Click here for ABCD2, HEART and other calculatorsREFRESH Note before signing :1}                              Medical Decision Making Amount and/or Complexity of Data Reviewed Labs: ordered. Radiology: ordered. ECG/medicine tests: ordered.  Risk OTC drugs.   This patient complains of ***; this involves an extensive number of treatment Options and is a complaint that carries with it a high risk of complications and morbidity. The differential includes ***  I ordered, reviewed and interpreted labs, which included *** I ordered medication *** and reviewed PMP when indicated. I ordered imaging studies which included *** and I independently    visualized and interpreted imaging which showed *** Additional history obtained from *** Previous records obtained and reviewed *** I consulted *** and discussed lab and imaging findings and discussed disposition.  Cardiac monitoring reviewed, *** Social determinants considered, *** Critical Interventions: ***  After the interventions stated  above, I reevaluated the patient and found *** Admission and further testing considered, ***   {Document critical care time when appropriate:1} {Document review of labs and clinical decision tools ie heart score, Chads2Vasc2 etc:1}  {Document your independent review of radiology images, and any outside records:1} {Document your discussion with family members, caretakers, and with consultants:1} {Document social determinants of health affecting pt's care:1} {Document your decision making why or why not admission, treatments were needed:1} Final Clinical Impression(s) / ED Diagnoses Final diagnoses:  None    Rx / DC Orders ED Discharge Orders     None
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
## 2023-04-26 NOTE — ED Triage Notes (Signed)
BIBA from [facility] for right foot swelling, redness, hot to touch. Pt has hx of toe amputation on this foot. 101.3 temp HI cbg 90 HR 138/83 BP 96% room air 24 RR 17 etco2

## 2023-04-26 NOTE — ED Notes (Signed)
3rd CMP sent to lab.

## 2023-04-26 NOTE — Progress Notes (Signed)
Pharmacy Antibiotic Note  Joy Tapia is a 82 y.o. female admitted on 04/26/2023 with redness to her foot and fever, pt has a hx of prior foot fracture and osteomyelitis.  Pharmacy has been consulted for vancomycin and zosyn dosing.  Plan: Vancomcyin 1gm IV x1 then 750mg  q48h (AUC 451.9, Scr 1.79) Zosyn 3.375g IV Q8H infused over 4hrs. Daily Scr Follow renal function, cultures and clinical course  Height: 5\' 1"  (154.9 cm) Weight: 58.1 kg (128 lb) IBW/kg (Calculated) : 47.8  Temp (24hrs), Avg:99.4 F (37.4 C), Min:98.2 F (36.8 C), Max:101.2 F (38.4 C)  Recent Labs  Lab 04/26/23 1747 04/26/23 1807 04/26/23 2035 04/26/23 2226 04/26/23 2247  WBC 12.9*  --   --   --  13.8*  CREATININE  --   --  1.79*  --   --   LATICACIDVEN  --  2.9*  --  1.9  --     Estimated Creatinine Clearance: 20.2 mL/min (A) (by C-G formula based on SCr of 1.79 mg/dL (H)).    Allergies  Allergen Reactions   Atorvastatin Diarrhea   Azithromycin Other (See Comments)    Pt states burns her insides.   Erythromycin Other (See Comments)   Propoxyphene Other (See Comments)    Antimicrobials this admission: 11/30 CTX x 1 11/30 flagyl x1 11/30 zosyn >> 12/1 vanc >>   Dose adjustments this admission:   Microbiology results: 11/30 BCx:   Thank you for allowing pharmacy to be a part of this patient's care.  Arley Phenix RPh 04/26/2023, 11:19 PM

## 2023-04-26 NOTE — ED Notes (Signed)
ED TO INPATIENT HANDOFF REPORT  Name/Age/Gender Joy Tapia 82 y.o. female  Code Status    Code Status Orders  (From admission, onward)           Start     Ordered   04/26/23 2138  Full code  Continuous       Question:  By:  Answer:  Consent: discussion documented in EHR   04/26/23 2138           Code Status History     Date Active Date Inactive Code Status Order ID Comments User Context   10/04/2022 0821 10/04/2022 1415 Full Code [id number]  [name], [facility] Inpatient   06/28/2022 1457 06/30/2022 1844 Full Code [id number]  Emeline General, MD ED       Home/SNF/Other Skilled nursing facility  Chief Complaint Cellulitis [L03.90]  Level of Care/Admitting Diagnosis ED Disposition     ED Disposition  Admit   Condition  --   Comment  Hospital Area: [facility] [[id number]]  Level of Care: Med-Surg [16]  May admit patient to [facility] or [facility] if equivalent level of care is available:: No  Covid Evaluation: Asymptomatic - no recent exposure (last 10 days) testing not required  Diagnosis: Cellulitis [865784]  Admitting Physician: Alan Mulder [[id number]]  Attending Physician: Alan Mulder [[id number]]  Certification:: I certify this patient will need inpatient services for at least 2 midnights  Expected Medical Readiness: 04/30/2023          Medical History Past Medical History:  Diagnosis Date   Deep vein thrombosis (DVT) (HCC)    Diabetes mellitus without complication (HCC)    Foot fracture, right    GERD (gastroesophageal reflux disease)    Gout    Hoarding disorder    Hyperlipidemia    Hypertension    Hypertension in stage 4 chronic kidney disease due to type 2 diabetes mellitus (HCC)    Migraines    Mild cognitive impairment    Non-compliance    Osteoarthritis    Osteoporosis, post-menopausal    Pneumonia     Allergies Allergies  Allergen Reactions   Atorvastatin Diarrhea   Azithromycin Other (See Comments)     Pt states burns her insides.   Erythromycin Other (See Comments)   Propoxyphene Other (See Comments)    IV Location/Drains/Wounds Patient Lines/Drains/Airways Status     Active Line/Drains/Airways     Name Placement date Placement time Site Days   Peripheral IV 04/26/23 20 G Anterior;Distal;Left Wrist 04/26/23  1808  Wrist  less than 1   Peripheral IV 04/26/23 20 G 1" Anterior;Right Forearm 04/26/23  2038  Forearm  less than 1   Wound / Incision (Open or Dehisced) 06/28/22 Non-pressure wound Toe (Comment  which one) Right redness plantar region right great toe 06/28/22  1730  Toe (Comment  which one)  302            Labs/Imaging Results for orders placed or performed during the hospital encounter of 04/26/23 (from the past 48 hour(s))  CBC with Differential     Status: Abnormal   Collection Time: 04/26/23  5:47 PM  Result Value Ref Range   WBC 12.9 (H) 4.0 - 10.5 K/uL   RBC 4.32 3.87 - 5.11 MIL/uL   Hemoglobin 12.6 12.0 - 15.0 g/dL   HCT 01.0 27.2 - 53.6 %   MCV 87.5 80.0 - 100.0 fL   MCH 29.2 26.0 - 34.0 pg   MCHC 33.3 30.0 - 36.0 g/dL  RDW 14.4 11.5 - 15.5 %   Platelets 235 150 - 400 K/uL   nRBC 0.0 0.0 - 0.2 %   Neutrophils Relative % 84 %   Neutro Abs 10.8 (H) 1.7 - 7.7 K/uL   Lymphocytes Relative 8 %   Lymphs Abs 1.1 0.7 - 4.0 K/uL   Monocytes Relative 7 %   Monocytes Absolute 0.9 0.1 - 1.0 K/uL   Eosinophils Relative 1 %   Eosinophils Absolute 0.1 0.0 - 0.5 K/uL   Basophils Relative 0 %   Basophils Absolute 0.0 0.0 - 0.1 K/uL   Immature Granulocytes 0 %   Abs Immature Granulocytes 0.05 0.00 - 0.07 K/uL    Comment: Performed at [facility], [address], [place], [place] [address]  Protime-INR     Status: None   Collection Time: 04/26/23  5:47 PM  Result Value Ref Range   Prothrombin Time 14.3 11.4 - 15.2 seconds   INR 1.1 0.8 - 1.2    Comment: (NOTE) INR goal varies based on device and disease states. Performed at [facility], [address] [place], [place] [address]   APTT     Status: None   Collection Time: 04/26/23  5:47 PM  Result Value Ref Range   aPTT 24 24 - 36 seconds    Comment: Performed at [facility], [address], [place], [place] [address]  SARS Coronavirus 2 by RT PCR (hospital order, performed in [facility] hospital lab) *cepheid single result test* Anterior Nasal Swab     Status: None   Collection Time: 04/26/23  6:06 PM   Specimen: Anterior Nasal Swab  Result Value Ref Range   SARS Coronavirus 2 by RT PCR NEGATIVE NEGATIVE    Comment: (NOTE) SARS-CoV-2 target nucleic acids are NOT DETECTED.  The SARS-CoV-2 RNA is generally detectable in upper and lower respiratory specimens during the acute phase of infection. The lowest concentration of SARS-CoV-2 viral copies this assay can detect is 250 copies / mL. A negative result does not preclude SARS-CoV-2 infection and should not be used as the sole basis for treatment or other patient management decisions.  A negative result may occur with improper specimen collection / handling, submission of specimen other than nasopharyngeal swab, presence of viral mutation(s) within the areas targeted by this assay, and inadequate number of viral copies (<250 copies / mL). A negative result must be combined with clinical observations, patient history, and epidemiological information.  Fact Sheet for Patients:   RoadLapTop.co.za  Fact Sheet for Healthcare Providers: http://kim-miller.com/  This test is not yet approved or  cleared by the [place] FDA and has been authorized for detection and/or diagnosis of SARS-CoV-2 by FDA under an Emergency Use Authorization (EUA).  This EUA will remain in effect (meaning this test can be used) for the duration of the COVID-19 declaration under Section 564(b)(1) of the Act, 21 U.S.C. section 360bbb-3(b)(1), unless the  authorization is terminated or revoked sooner.  Performed at [facility], [address], [place], [place] [address]   I-Stat Lactic Acid, ED     Status: Abnormal   Collection Time: 04/26/23  6:07 PM  Result Value Ref Range   Lactic Acid, Venous 2.9 (HH) 0.5 - 1.9 mmol/L   Comment NOTIFIED PHYSICIAN   Comprehensive metabolic panel     Status: Abnormal   Collection Time: 04/26/23  8:35 PM  Result Value Ref Range   Sodium 130 (L) 135 - 145  mmol/L   Potassium 3.5 3.5 - 5.1 mmol/L   Chloride 98 98 - 111 mmol/L   CO2 21 (L) 22 - 32 mmol/L   Glucose, Bld 389 (H) 70 - 99 mg/dL    Comment: Glucose reference range applies only to samples taken after fasting for at least 8 hours.   BUN 31 (H) 8 - 23 mg/dL   Creatinine, Ser 9.62 (H) 0.44 - 1.00 mg/dL   Calcium 7.6 (L) 8.9 - 10.3 mg/dL   Total Protein 6.0 (L) 6.5 - 8.1 g/dL   Albumin 2.7 (L) 3.5 - 5.0 g/dL   AST 26 15 - 41 U/L   ALT 23 0 - 44 U/L   Alkaline Phosphatase 88 38 - 126 U/L   Total Bilirubin 0.3 <1.2 mg/dL   GFR, Estimated 28 (L) >60 mL/min    Comment: (NOTE) Calculated using the CKD-EPI Creatinine Equation (2021)    Anion gap 11 5 - 15    Comment: Performed at [facility], [address], [place], [place] [address]   DG Foot Complete Right  Result Date: 04/26/2023 CLINICAL DATA:  Cellulitis. EXAM: RIGHT FOOT COMPLETE - 3+ VIEW COMPARISON:  June 28, 2022. FINDINGS: Status post surgical amputation of distal first metatarsal and phalanges. There is dislocation of the second metatarsophalangeal joint. No definite lytic destruction or acute fracture is noted. IMPRESSION: Postsurgical changes as noted above. Dislocation of second metatarsophalangeal joint Electronically Signed   By: Lupita Raider M.D.   On: 04/26/2023 18:30   DG Chest 2 View  Result Date: 04/26/2023 CLINICAL DATA:  Possible sepsis. EXAM: CHEST - 2 VIEW COMPARISON:  None Available. FINDINGS: The heart size and  mediastinal contours are within normal limits. Both lungs are clear. The visualized skeletal structures are unremarkable. IMPRESSION: No active cardiopulmonary disease. Electronically Signed   By: Lupita Raider M.D.   On: 04/26/2023 18:27    Pending Labs Unresulted Labs (From admission, onward)     Start     Ordered   05/03/23 0500  Creatinine, serum  (enoxaparin (LOVENOX)    CrCl >/= 30 ml/min)  Weekly,   R     Comments: while on enoxaparin therapy    04/26/23 2138   04/26/23 2144  C-reactive protein  Once,   R        04/26/23 2143   04/26/23 2143  Sedimentation rate  Once,   R        04/26/23 2143   04/26/23 2138  CBC  (enoxaparin (LOVENOX)    CrCl >/= 30 ml/min)  Once,   R       Comments: Baseline for enoxaparin therapy IF NOT ALREADY DRAWN.  Notify MD if PLT < 100 K.    04/26/23 2138   04/26/23 2138  Creatinine, serum  (enoxaparin (LOVENOX)    CrCl >/= 30 ml/min)  Once,   R       Comments: Baseline for enoxaparin therapy IF NOT ALREADY DRAWN.    04/26/23 2138   04/26/23 1754  Urinalysis, w/ Reflex to Culture (Infection Suspected) -Urine, Clean Catch  (Undifferentiated presentation (screening labs and basic nursing orders))  ONCE - URGENT,   URGENT       Question:  Specimen Source  Answer:  Urine, Clean Catch   04/26/23 1754   04/26/23 1747  Culture, blood (Routine x 2)  BLOOD CULTURE X 2,   R (with STAT occurrences)      04/26/23 1746  Vitals/Pain Today's Vitals   04/26/23 1930 04/26/23 1945 04/26/23 2000 04/26/23 2100  BP: (!) 109/42  (!) 115/46 (!) 115/45  Pulse: 74 78 75 73  Resp: (!) 25 13 20  (!) 21  Temp:   98.9 F (37.2 C)   TempSrc:   Oral   SpO2: 96% 97% 96% 97%  Weight:      Height:        Isolation Precautions Airborne and Contact precautions  Medications Medications  lactated ringers infusion ( Intravenous New Bag/Given 04/26/23 1908)  aspirin EC tablet 81 mg (has no administration in time range)  metoprolol succinate (TOPROL-XL) 24 hr  tablet 50 mg (has no administration in time range)  pravastatin (PRAVACHOL) tablet 20 mg (has no administration in time range)  pantoprazole (PROTONIX) EC tablet 40 mg (has no administration in time range)  enoxaparin (LOVENOX) injection 40 mg (has no administration in time range)  acetaminophen (TYLENOL) tablet 650 mg (has no administration in time range)    Or  acetaminophen (TYLENOL) suppository 650 mg (has no administration in time range)  oxyCODONE (Oxy IR/ROXICODONE) immediate release tablet 5 mg (has no administration in time range)  ondansetron (ZOFRAN) tablet 4 mg (has no administration in time range)    Or  ondansetron (ZOFRAN) injection 4 mg (has no administration in time range)  insulin detemir (LEVEMIR) injection 12 Units (has no administration in time range)  insulin aspart (novoLOG) injection 0-24 Units (has no administration in time range)  acetaminophen (TYLENOL) tablet 650 mg (650 mg Oral Given 04/26/23 1817)  sodium chloride 0.9 % bolus 1,000 mL (0 mLs Intravenous Stopped 04/26/23 1951)  sodium chloride 0.9 % bolus 700 mL (0 mLs Intravenous Stopped 04/26/23 1937)  benzonatate (TESSALON) capsule 100 mg (100 mg Oral Given 04/26/23 1938)    Mobility walks with device

## 2023-04-26 NOTE — ED Notes (Signed)
Patient transported to x-ray. ?

## 2023-04-27 DIAGNOSIS — A419 Sepsis, unspecified organism: Secondary | ICD-10-CM | POA: Diagnosis not present

## 2023-04-27 LAB — GLUCOSE, CAPILLARY
Glucose-Capillary: 71 mg/dL (ref 70–99)
Glucose-Capillary: 122 mg/dL — ABNORMAL HIGH (ref 70–99)
Glucose-Capillary: 211 mg/dL — ABNORMAL HIGH (ref 70–99)
Glucose-Capillary: 95 mg/dL (ref 70–99)
Glucose-Capillary: 168 mg/dL — ABNORMAL HIGH (ref 70–99)

## 2023-04-27 LAB — URINALYSIS, W/ REFLEX TO CULTURE (INFECTION SUSPECTED)
Bilirubin Urine: NEGATIVE
Glucose, UA: 50 mg/dL — AB
Ketones, ur: NEGATIVE mg/dL
Nitrite: NEGATIVE
Protein, ur: NEGATIVE mg/dL
Specific Gravity, Urine: 1.008 (ref 1.005–1.030)
pH: 5 (ref 5.0–8.0)

## 2023-04-27 LAB — SEDIMENTATION RATE: Sed Rate: 66 mm/h — ABNORMAL HIGH (ref 0–22)

## 2023-04-27 LAB — CREATININE, SERUM
Creatinine, Ser: 1.66 mg/dL — ABNORMAL HIGH (ref 0.44–1.00)
GFR, Estimated: 31 mL/min — ABNORMAL LOW (ref >60)

## 2023-04-27 LAB — VITAMIN B12: Vitamin B-12: 1299 pg/mL — ABNORMAL HIGH (ref 180–914)

## 2023-04-27 LAB — C-REACTIVE PROTEIN: CRP: 21 mg/dL — ABNORMAL HIGH (ref <1.0)

## 2023-04-27 MED ORDER — METOPROLOL SUCCINATE ER 25 MG PO TB24
25.0000 mg | ORAL_TABLET | Freq: Every day | ORAL | Status: DC
  Administered 2023-04-27 – 2023-04-29 (×3): 25 mg via ORAL
  Filled 2023-04-27 (×3): qty 1

## 2023-04-27 MED ORDER — METRONIDAZOLE 500 MG/100ML IV SOLN
500.0000 mg | Freq: Two times a day (BID) | INTRAVENOUS | Status: DC
  Administered 2023-04-27 – 2023-04-29 (×4): 500 mg via INTRAVENOUS
  Filled 2023-04-27 (×4): qty 100

## 2023-04-27 MED ORDER — DOXYCYCLINE HYCLATE 100 MG IV SOLR
100.0000 mg | Freq: Two times a day (BID) | INTRAVENOUS | Status: DC
  Administered 2023-04-27 – 2023-04-29 (×3): 100 mg via INTRAVENOUS
  Filled 2023-04-27 (×4): qty 100

## 2023-04-27 MED ORDER — SODIUM CHLORIDE 0.9 % IV SOLN
2.0000 g | INTRAVENOUS | Status: DC
  Administered 2023-04-27 – 2023-04-28 (×2): 2 g via INTRAVENOUS
  Filled 2023-04-27 (×2): qty 12.5

## 2023-04-27 MED ORDER — FLUTICASONE PROPIONATE 50 MCG/ACT NA SUSP
1.0000 | Freq: Every day | NASAL | Status: DC
  Administered 2023-04-27 – 2023-04-29 (×3): 1 via NASAL
  Filled 2023-04-27 (×2): qty 16

## 2023-04-27 MED ORDER — VANCOMYCIN HCL 750 MG/150ML IV SOLN: 750.0000 mg | INTRAVENOUS | Status: DC

## 2023-04-27 MED ORDER — BENZONATATE 100 MG PO CAPS
100.0000 mg | ORAL_CAPSULE | Freq: Three times a day (TID) | ORAL | Status: DC | PRN
  Administered 2023-04-27 – 2023-04-28 (×2): 100 mg via ORAL
  Filled 2023-04-27 (×2): qty 1

## 2023-04-27 MED ORDER — SODIUM CHLORIDE 0.9 % IV SOLN: INTRAVENOUS | Status: DC

## 2023-04-27 NOTE — Progress Notes (Addendum)
PROGRESS NOTE    Joy Tapia  ZOX:[id number] DOB: 1940-11-08 DOA: 04/26/2023 PCP: Lauro Regulus, MD   Brief Narrative: 82 year old with past medical history significant for DVT, gout, hyperlipidemia, hypertension presents with foot redness, and fever.  History limited due to patient's dementia.  Patient has a prior history of foot fracture and osteomyelitis that required amputation in the past.  She has been more somnolent and tired at her facility.  She presented febrile with a temperature of 102, leukocytosis, lactic acid 2.9, AKI with a creatinine of 1.7.  Patient admitted for foot cellulitis.   Assessment & Plan:   Principal Problem:   Cellulitis   1-Sepsis secondary to right foot cellulitis, diabetic foot infection -Patient presents with fever, leukocytosis, lactic acidosis lactic acid 2.9 in the setting of foot infection. -\Continue with Vancomycin and cefepime and flagyl.  -IV fluids.  Change vancomycin to doxy due to CKD   CKD stage IIIb -Prior Cr at 1.4--1.6 -Monitor renal function.    Diabetes type 2: Hyperglycemia uncontrolled -Continue with Levemir 12 units.  SSI   History of cognitive impairment: -Check B 12   CAD: Continue with aspirin   Hypertension: Continue with metoprolol, lower dose   GERD: Continue with Protonix   Hyperlipidemia: Continue with the statins        Estimated body mass index is 24.19 kg/m as calculated from the following:   Height as of this encounter: 5\' 1"  (1.549 m).   Weight as of this encounter: 58.1 kg.   DVT prophylaxis: Lovenox Code Status: Full code Family Communication: POA at bedside.  Disposition Plan:  Status is: Inpatient Remains inpatient appropriate because: management cellulitis.     Consultants:  None  Procedures:  None  Antimicrobials:    Subjective: She is alert, having some confusion.  Denies pin, report foot redness.   Objective: Vitals:   04/26/23 2215 04/26/23  2241 04/27/23 0202 04/27/23 0628  BP:  (!) 151/54 (!) 105/52 (!) 111/53  Pulse: 74 74 94 66  Resp: (!) 21 17 14 16   Temp:  98.2 F (36.8 C) 99.5 F (37.5 C) 97.9 F (36.6 C)  TempSrc:  Oral Oral   SpO2: 100% 100% 97% 100%  Weight:      Height:        Intake/Output Summary (Last 24 hours) at 04/27/2023 0657 Last data filed at 04/27/2023 0616 Gross per 24 hour  Intake 3978.43 ml  Output 0 ml  Net 3978.43 ml   Filed Weights   04/26/23 1829  Weight: 58.1 kg    Examination:  General exam: Appears calm and comfortable  Respiratory system: Clear to auscultation. Respiratory effort normal. Cardiovascular system: S1 & S2 heard, RRR. No JVD, murmurs, rubs, gallops or clicks. No pedal edema. Gastrointestinal system: Abdomen is nondistended, soft and nontender. No organomegaly or masses felt. Normal bowel sounds heard. Central nervous system: Alert and oriented. No focal neurological deficits. Extremities: Right foot with redness edema Data Reviewed: I have personally reviewed following labs and imaging studies  CBC: Recent Labs  Lab 04/26/23 1747 04/26/23 2247  WBC 12.9* 13.8*  NEUTROABS 10.8*  --   HGB 12.6 11.9*  HCT 37.8 35.5*  MCV 87.5 86.6  PLT 235 229   Basic Metabolic Panel: Recent Labs  Lab 04/26/23 2035 04/26/23 2247 04/27/23 0318  NA 130*  --   --   K 3.5  --   --   CL 98  --   --   CO2 21*  --   --  GLUCOSE 389*  --   --   BUN 31*  --   --   CREATININE 1.79* 1.74* 1.66*  CALCIUM 7.6*  --   --    GFR: Estimated Creatinine Clearance: 21.8 mL/min (A) (by C-G formula based on SCr of 1.66 mg/dL (H)). Liver Function Tests: Recent Labs  Lab 04/26/23 2035  AST 26  ALT 23  ALKPHOS 88  BILITOT 0.3  PROT 6.0*  ALBUMIN 2.7*   No results for input(s): "LIPASE", "AMYLASE" in the last 168 hours. No results for input(s): "AMMONIA" in the last 168 hours. Coagulation Profile: Recent Labs  Lab 04/26/23 1747  INR 1.1   Cardiac Enzymes: No results for  input(s): "CKTOTAL", "CKMB", "CKMBINDEX", "TROPONINI" in the last 168 hours. BNP (last 3 results) No results for input(s): "PROBNP" in the last 8760 hours. HbA1C: No results for input(s): "HGBA1C" in the last 72 hours. CBG: Recent Labs  Lab 04/26/23 2217 04/26/23 2345 04/27/23 0353  GLUCAP 344* 232* 71   Lipid Profile: No results for input(s): "CHOL", "HDL", "LDLCALC", "TRIG", "CHOLHDL", "LDLDIRECT" in the last 72 hours. Thyroid Function Tests: No results for input(s): "TSH", "T4TOTAL", "FREET4", "T3FREE", "THYROIDAB" in the last 72 hours. Anemia Panel: No results for input(s): "VITAMINB12", "FOLATE", "FERRITIN", "TIBC", "IRON", "RETICCTPCT" in the last 72 hours. Sepsis Labs: Recent Labs  Lab 04/26/23 1807 04/26/23 2226  LATICACIDVEN 2.9* 1.9    Recent Results (from the past 240 hour(s))  SARS Coronavirus 2 by RT PCR (hospital order, performed in [facility] hospital lab) *cepheid single result test* Anterior Nasal Swab     Status: None   Collection Time: 04/26/23  6:06 PM   Specimen: Anterior Nasal Swab  Result Value Ref Range Status   SARS Coronavirus 2 by RT PCR NEGATIVE NEGATIVE Final    Comment: (NOTE) SARS-CoV-2 target nucleic acids are NOT DETECTED.  The SARS-CoV-2 RNA is generally detectable in upper and lower respiratory specimens during the acute phase of infection. The lowest concentration of SARS-CoV-2 viral copies this assay can detect is 250 copies / mL. A negative result does not preclude SARS-CoV-2 infection and should not be used as the sole basis for treatment or other patient management decisions.  A negative result may occur with improper specimen collection / handling, submission of specimen other than nasopharyngeal swab, presence of viral mutation(s) within the areas targeted by this assay, and inadequate number of viral copies (<250 copies / mL). A negative result must be combined with clinical observations, patient history, and epidemiological  information.  Fact Sheet for Patients:   RoadLapTop.co.za  Fact Sheet for Healthcare Providers: http://kim-miller.com/  This test is not yet approved or  cleared by the [place] FDA and has been authorized for detection and/or diagnosis of SARS-CoV-2 by FDA under an Emergency Use Authorization (EUA).  This EUA will remain in effect (meaning this test can be used) for the duration of the COVID-19 declaration under Section 564(b)(1) of the Act, 21 U.S.C. section 360bbb-3(b)(1), unless the authorization is terminated or revoked sooner.  Performed at [facility], [address], [place], [place] [address]          Radiology Studies: DG Foot Complete Right  Result Date: 04/26/2023 CLINICAL DATA:  Cellulitis. EXAM: RIGHT FOOT COMPLETE - 3+ VIEW COMPARISON:  June 28, 2022. FINDINGS: Status post surgical amputation of distal first metatarsal and phalanges. There is dislocation of the second metatarsophalangeal joint. No definite lytic destruction or acute fracture is noted. IMPRESSION: Postsurgical changes as noted  above. Dislocation of second metatarsophalangeal joint Electronically Signed   By: Lupita Raider M.D.   On: 04/26/2023 18:30   DG Chest 2 View  Result Date: 04/26/2023 CLINICAL DATA:  Possible sepsis. EXAM: CHEST - 2 VIEW COMPARISON:  None Available. FINDINGS: The heart size and mediastinal contours are within normal limits. Both lungs are clear. The visualized skeletal structures are unremarkable. IMPRESSION: No active cardiopulmonary disease. Electronically Signed   By: Lupita Raider M.D.   On: 04/26/2023 18:27        Scheduled Meds:  aspirin EC  81 mg Oral Daily   enoxaparin (LOVENOX) injection  30 mg Subcutaneous Q24H   insulin aspart  0-24 Units Subcutaneous Q4H   insulin detemir  12 Units Subcutaneous Daily   metoprolol succinate  50 mg Oral Daily   pantoprazole  40 mg Oral Daily    pravastatin  20 mg Oral QHS   Continuous Infusions:  lactated ringers 150 mL/hr at 04/27/23 0616   piperacillin-tazobactam (ZOSYN)  IV     [START ON 04/29/2023] vancomycin       LOS: 1 day    Time spent: 35 Minutes     Arpan Eskelson A Daily Doe, MD [facility]   If 7PM-7AM, please contact night-coverage [email or web address]  04/27/2023, 6:57 AM

## 2023-04-27 NOTE — Progress Notes (Signed)
Pharmacy Antibiotic Note  Joy Tapia is a 82 y.o. female admitted on 04/26/2023 with sepsis secondary to right foot cellulitis/diabetic foot infection. Pharmacy consulted for dosing of Cefepime.   Plan: Cefepime 2g IV q24h. Monitor renal function, cultures, clinical course.   Height: 5\' 1"  (154.9 cm) Weight: 58.1 kg (128 lb) IBW/kg (Calculated) : 47.8  Temp (24hrs), Avg:99.1 F (37.3 C), Min:97.9 F (36.6 C), Max:101.2 F (38.4 C)  Recent Labs  Lab 04/26/23 1747 04/26/23 1807 04/26/23 2035 04/26/23 2226 04/26/23 2247 04/27/23 0318  WBC 12.9*  --   --   --  13.8*  --   CREATININE  --   --  1.79*  --  1.74* 1.66*  LATICACIDVEN  --  2.9*  --  1.9  --   --     Estimated Creatinine Clearance: 21.8 mL/min (A) (by C-G formula based on SCr of 1.66 mg/dL (H)).    Allergies  Allergen Reactions   Atorvastatin Diarrhea   Azithromycin Other (See Comments)    Pt states burns her insides.   Erythromycin Other (See Comments)   Propoxyphene Other (See Comments)    Microbiology results: 11/30 BCx:  12/1 UCx:  12/1 C.diff PCR:   Thank you for allowing pharmacy to be a part of this patient's care.   Greer Pickerel, PharmD, BCPS Clinical Pharmacist 04/27/2023, 1:24 PM

## 2023-04-27 NOTE — Plan of Care (Signed)
  Problem: Education: Goal: Knowledge of General Education information will improve Description: Including pain rating scale, medication(s)/side effects and non-pharmacologic comfort measures Outcome: Progressing   Problem: Coping: Goal: Level of anxiety will decrease Outcome: Progressing   Problem: Pain Management: Goal: General experience of comfort will improve Outcome: Progressing   Problem: Safety: Goal: Ability to remain free from injury will improve Outcome: Progressing   Problem: Skin Integrity: Goal: Skin integrity will improve Outcome: Progressing

## 2023-04-27 NOTE — Plan of Care (Signed)
  Problem: Activity: Goal: Risk for activity intolerance will decrease Outcome: Progressing   Problem: Nutrition: Goal: Adequate nutrition will be maintained Outcome: Progressing   Problem: Coping: Goal: Level of anxiety will decrease Outcome: Progressing   Problem: Elimination: Goal: Will not experience complications related to urinary retention Outcome: Progressing   Problem: Pain Management: Goal: General experience of comfort will improve Outcome: Progressing   Problem: Safety: Goal: Ability to remain free from injury will improve Outcome: Progressing   Problem: Skin Integrity: Goal: Skin integrity will improve Outcome: Progressing

## 2023-04-28 DIAGNOSIS — A419 Sepsis, unspecified organism: Secondary | ICD-10-CM | POA: Diagnosis not present

## 2023-04-28 LAB — CBC
HCT: 32.3 % — ABNORMAL LOW (ref 36.0–46.0)
Hemoglobin: 10.4 g/dL — ABNORMAL LOW (ref 12.0–15.0)
MCH: 28.5 pg (ref 26.0–34.0)
MCHC: 32.2 g/dL (ref 30.0–36.0)
MCV: 88.5 fL (ref 80.0–100.0)
Platelets: 233 10*3/uL (ref 150–400)
RBC: 3.65 MIL/uL — ABNORMAL LOW (ref 3.87–5.11)
RDW: 14.6 % (ref 11.5–15.5)
WBC: 10.1 10*3/uL (ref 4.0–10.5)
nRBC: 0 % (ref 0.0–0.2)

## 2023-04-28 LAB — BASIC METABOLIC PANEL
Anion gap: 9 (ref 5–15)
BUN: 21 mg/dL (ref 8–23)
CO2: 20 mmol/L — ABNORMAL LOW (ref 22–32)
Calcium: 8 mg/dL — ABNORMAL LOW (ref 8.9–10.3)
Chloride: 107 mmol/L (ref 98–111)
Creatinine, Ser: 1.71 mg/dL — ABNORMAL HIGH (ref 0.44–1.00)
GFR, Estimated: 30 mL/min — ABNORMAL LOW (ref >60)
Glucose, Bld: 230 mg/dL — ABNORMAL HIGH (ref 70–99)
Potassium: 3.3 mmol/L — ABNORMAL LOW (ref 3.5–5.1)
Sodium: 136 mmol/L (ref 135–145)

## 2023-04-28 LAB — C DIFFICILE QUICK SCREEN W PCR REFLEX
C Diff antigen: NEGATIVE
C Diff interpretation: NOT DETECTED
C Diff toxin: NEGATIVE

## 2023-04-28 LAB — GLUCOSE, CAPILLARY
Glucose-Capillary: 119 mg/dL — ABNORMAL HIGH (ref 70–99)
Glucose-Capillary: 124 mg/dL — ABNORMAL HIGH (ref 70–99)
Glucose-Capillary: 134 mg/dL — ABNORMAL HIGH (ref 70–99)
Glucose-Capillary: 159 mg/dL — ABNORMAL HIGH (ref 70–99)
Glucose-Capillary: 207 mg/dL — ABNORMAL HIGH (ref 70–99)
Glucose-Capillary: 88 mg/dL (ref 70–99)

## 2023-04-28 LAB — CREATININE, SERUM
Creatinine, Ser: 1.89 mg/dL — ABNORMAL HIGH (ref 0.44–1.00)
GFR, Estimated: 26 mL/min — ABNORMAL LOW (ref >60)

## 2023-04-28 LAB — URINE CULTURE: Culture: NO GROWTH

## 2023-04-28 MED ORDER — POTASSIUM CHLORIDE CRYS ER 20 MEQ PO TBCR
40.0000 meq | EXTENDED_RELEASE_TABLET | Freq: Once | ORAL | Status: AC
  Administered 2023-04-28: 40 meq via ORAL
  Filled 2023-04-28: qty 2

## 2023-04-28 MED ORDER — SODIUM CHLORIDE 0.9 % IV SOLN: INTRAVENOUS | Status: DC

## 2023-04-28 MED ORDER — GUAIFENESIN-DM 100-10 MG/5ML PO SYRP: 5.0000 mL | ORAL_SOLUTION | ORAL | Status: DC | PRN

## 2023-04-28 MED ORDER — HYDROCERIN EX CREA
TOPICAL_CREAM | Freq: Two times a day (BID) | CUTANEOUS | Status: DC
  Filled 2023-04-28: qty 113

## 2023-04-28 NOTE — Evaluation (Signed)
Physical Therapy Evaluation Patient Details Name: Joy Tapia MRN: [id number] DOB: 1940-09-09 Today's Date: 04/28/2023  History of Present Illness  82 yo female presented with foot redness, and fever, admitted with cellulitis R LE . PMH: DVT, gout, hyperlipidemia, hypertension  Clinical Impression  Pt admitted with above diagnosis.  Pt is motivated to work with PT. Amb hallway distance with supervision to CGA for IV line management and overall safety. Plan is to return to ALF, no PT f/u indicated.  Pt currently with functional limitations due to the deficits listed below (see PT Problem List). Pt will benefit from acute skilled PT to increase their independence and safety with mobility to allow discharge.           If plan is discharge home, recommend the following: Help with stairs or ramp for entrance;Assist for transportation;Supervision due to cognitive status   Can travel by private vehicle        Equipment Recommendations None recommended by PT  Recommendations for Other Services       Functional Status Assessment Patient has had a recent decline in their functional status and demonstrates the ability to make significant improvements in function in a reasonable and predictable amount of time.     Precautions / Restrictions Precautions Precautions: Fall Restrictions Weight Bearing Restrictions: No      Mobility  Bed Mobility   Bed Mobility: Supine to Sit, Sit to Supine     Supine to sit: Supervision Sit to supine: Supervision   General bed mobility comments: for lines and safety    Transfers Overall transfer level: Needs assistance Equipment used: Rolling walker (2 wheels), None Transfers: Sit to/from Stand Sit to Stand: Supervision, Contact guard assist           General transfer comment: cues and CGA to supervision  for safety;    Ambulation/Gait Ambulation/Gait assistance: Contact guard assist, Supervision Gait Distance (Feet): 200  Feet Assistive device: Rolling walker (2 wheels), None Gait Pattern/deviations: Step-through pattern, Decreased stance time - right       General Gait Details: RW for 175', pt amb ~ 15' without device, CGA for safety, no overt LOB  Stairs            Wheelchair Mobility     Tilt Bed    Modified Rankin (Stroke Patients Only)       Balance Overall balance assessment: Needs assistance Sitting-balance support: Feet supported, No upper extremity supported Sitting balance-Leahy Scale: Good     Standing balance support: Single extremity supported, During functional activity Standing balance-Leahy Scale: Fair                               Pertinent Vitals/Pain Pain Assessment Pain Assessment: No/denies pain    Home Living Family/patient expects to be discharged to:: Assisted living                 Home Equipment: Agricultural consultant (2 wheels);Cane - single point;Crutches;Shower seat Additional Comments: per pt she " I do not need the walker but they insist I use it"    Prior Function Prior Level of Function : Independent/Modified Independent;Driving                     Extremity/Trunk Assessment   Upper Extremity Assessment Upper Extremity Assessment: Overall WFL for tasks assessed    Lower Extremity Assessment Lower Extremity Assessment: Overall WFL for tasks assessed  Communication   Communication Communication: No apparent difficulties  Cognition Arousal: Alert Behavior During Therapy: WFL for tasks assessed/performed Overall Cognitive Status: History of cognitive impairments - at baseline                                          General Comments      Exercises     Assessment/Plan    PT Assessment Patient needs continued PT services  PT Problem List Decreased cognition;Decreased knowledge of use of DME;Decreased safety awareness       PT Treatment Interventions DME instruction;Gait  training;Functional mobility training;Therapeutic activities;Therapeutic exercise    PT Goals (Current goals can be found in the Care Plan section)  Acute Rehab PT Goals PT Goal Formulation: With patient Time For Goal Achievement: 05/05/23 Potential to Achieve Goals: Good    Frequency Min 1X/week     Co-evaluation               AM-PAC PT "6 Clicks" Mobility  Outcome Measure Help needed turning from your back to your side while in a flat bed without using bedrails?: None Help needed moving from lying on your back to sitting on the side of a flat bed without using bedrails?: None Help needed moving to and from a bed to a chair (including a wheelchair)?: A Little Help needed standing up from a chair using your arms (e.g., wheelchair or bedside chair)?: A Little Help needed to walk in hospital room?: A Little Help needed climbing 3-5 steps with a railing? : A Little 6 Click Score: 20    End of Session Equipment Utilized During Treatment: Gait belt Activity Tolerance: Patient tolerated treatment well Patient left: with call bell/phone within reach;in bed;with bed alarm set   PT Visit Diagnosis: Other abnormalities of gait and mobility (R26.89)    Time: 2956-2130 PT Time Calculation (min) (ACUTE ONLY): 13 min   Charges:   PT Evaluation $PT Eval Low Complexity: 1 Low   PT General Charges $$ ACUTE PT VISIT: 1 Visit         Tristina Sahagian, PT  Acute Rehab Dept [facility]) [phone]  04/28/2023   [facility] 04/28/2023, 4:11 PM

## 2023-04-28 NOTE — Plan of Care (Signed)
  Problem: Clinical Measurements: Goal: Ability to maintain clinical measurements within normal limits will improve Outcome: Progressing   Problem: Activity: Goal: Risk for activity intolerance will decrease Outcome: Progressing   Problem: Elimination: Goal: Will not experience complications related to urinary retention Outcome: Adequate for Discharge   Problem: Pain Management: Goal: General experience of comfort will improve Outcome: Progressing   Problem: Safety: Goal: Ability to remain free from injury will improve Outcome: Progressing

## 2023-04-28 NOTE — Progress Notes (Signed)
PROGRESS NOTE    Joy Tapia  ZSW:[id number] DOB: October 29, 1940 DOA: 04/26/2023 PCP: Lauro Regulus, MD   Brief Narrative: 82 year old with past medical history significant for DVT, gout, hyperlipidemia, hypertension presents with foot redness, and fever.  History limited due to patient's dementia.  Patient has a prior history of foot fracture and osteomyelitis that required amputation in the past.  She has been more somnolent and tired at her facility.  She presented febrile with a temperature of 102, leukocytosis, lactic acid 2.9, AKI with a creatinine of 1.7.  Patient admitted for foot cellulitis.   Assessment & Plan:   Principal Problem:   Cellulitis   1-Sepsis secondary to right foot cellulitis, diabetic foot infection -Patient presents with fever, leukocytosis, lactic acidosis lactic acid 2.9 in the setting of foot infection. -\Continue with cefepime and flagyl.  -IV fluids.  Change vancomycin to doxy due to CKD  Redness improving.  Needs to follow up with Podiatry out patient.  Plan for discharge tomorrow.   CKD stage IIIb -Prior Cr at 1.4--1.6 -Monitor renal function.  Cr down to 1.7  Diabetes type 2: Hyperglycemia uncontrolled -Continue with Levemir 12 units.  SSI   History of cognitive impairment: -Check B 12   CAD: Continue with aspirin   Hypertension: Continue with metoprolol, lower dose   GERD: Continue with Protonix   Hyperlipidemia: Continue with the statins  Diarrhea; C diff negative.   Hypokalemia; replete      Estimated body mass index is 24.19 kg/m as calculated from the following:   Height as of this encounter: 5\' 1"  (1.549 m).   Weight as of this encounter: 58.1 kg.   DVT prophylaxis: Lovenox Code Status: Full code Family Communication: POA at bedside 12/01.  Disposition Plan:  Status is: Inpatient Remains inpatient appropriate because: management cellulitis.     Consultants:  None  Procedures:   None  Antimicrobials:    Subjective: Feeling better. Denies pain. Redness improving.   Objective: Vitals:   04/27/23 0202 04/27/23 0628 04/27/23 2055 04/28/23 0503  BP: (!) 105/52 (!) 111/53 (!) 127/51 (!) 141/62  Pulse: 94 66 69 79  Resp: 14 16 18 18   Temp: 99.5 F (37.5 C) 97.9 F (36.6 C) 98.2 F (36.8 C) 99.3 F (37.4 C)  TempSrc: Oral  Oral Oral  SpO2: 97% 100% 98% 99%  Weight:      Height:        Intake/Output Summary (Last 24 hours) at 04/28/2023 1326 Last data filed at 04/28/2023 1100 Gross per 24 hour  Intake 2073.47 ml  Output 250 ml  Net 1823.47 ml   Filed Weights   04/26/23 1829  Weight: 58.1 kg    Examination:  General exam: NAD Respiratory system: CTA Cardiovascular system: S 1, S 2 RRR Gastrointestinal system: BS present, soft, nt Extremities: Right foot with less redness edema, plantar ulcer, no open,  Data Reviewed: I have personally reviewed following labs and imaging studies  CBC: Recent Labs  Lab 04/26/23 1747 04/26/23 2247 04/28/23 0807  WBC 12.9* 13.8* 10.1  NEUTROABS 10.8*  --   --   HGB 12.6 11.9* 10.4*  HCT 37.8 35.5* 32.3*  MCV 87.5 86.6 88.5  PLT 235 229 233   Basic Metabolic Panel: Recent Labs  Lab 04/26/23 2035 04/26/23 2247 04/27/23 0318 04/28/23 0308 04/28/23 0807  NA 130*  --   --   --  136  K 3.5  --   --   --  3.3*  CL 98  --   --   --  107  CO2 21*  --   --   --  20*  GLUCOSE 389*  --   --   --  230*  BUN 31*  --   --   --  21  CREATININE 1.79* 1.74* 1.66* 1.89* 1.71*  CALCIUM 7.6*  --   --   --  8.0*   GFR: Estimated Creatinine Clearance: 21.1 mL/min (A) (by C-G formula based on SCr of 1.71 mg/dL (H)). Liver Function Tests: Recent Labs  Lab 04/26/23 2035  AST 26  ALT 23  ALKPHOS 88  BILITOT 0.3  PROT 6.0*  ALBUMIN 2.7*   No results for input(s): "LIPASE", "AMYLASE" in the last 168 hours. No results for input(s): "AMMONIA" in the last 168 hours. Coagulation Profile: Recent Labs  Lab  04/26/23 1747  INR 1.1   Cardiac Enzymes: No results for input(s): "CKTOTAL", "CKMB", "CKMBINDEX", "TROPONINI" in the last 168 hours. BNP (last 3 results) No results for input(s): "PROBNP" in the last 8760 hours. HbA1C: No results for input(s): "HGBA1C" in the last 72 hours. CBG: Recent Labs  Lab 04/27/23 1951 04/28/23 0003 04/28/23 0418 04/28/23 0850 04/28/23 1202  GLUCAP 168* 134* 124* 207* 119*   Lipid Profile: No results for input(s): "CHOL", "HDL", "LDLCALC", "TRIG", "CHOLHDL", "LDLDIRECT" in the last 72 hours. Thyroid Function Tests: No results for input(s): "TSH", "T4TOTAL", "FREET4", "T3FREE", "THYROIDAB" in the last 72 hours. Anemia Panel: Recent Labs    04/27/23 1233  VITAMINB12 1,299*   Sepsis Labs: Recent Labs  Lab 04/26/23 1807 04/26/23 2226  LATICACIDVEN 2.9* 1.9    Recent Results (from the past 240 hour(s))  Culture, blood (Routine x 2)     Status: None (Preliminary result)   Collection Time: 04/26/23  5:58 PM   Specimen: BLOOD  Result Value Ref Range Status   Specimen Description   Final    BLOOD BLOOD LEFT WRIST Performed at [facility], [address], [place], [place] [address]    Special Requests   Final    BOTTLES DRAWN AEROBIC ONLY Blood Culture adequate volume Performed at [facility], [address], [place], [place] [address]    Culture   Final    NO GROWTH 1 DAY Performed at [facility], [address] [place], [place] [address]    Report Status PENDING  Incomplete  SARS Coronavirus 2 by RT PCR (hospital order, performed in [facility] hospital lab) *cepheid single result test* Anterior Nasal Swab     Status: None   Collection Time: 04/26/23  6:06 PM   Specimen: Anterior Nasal Swab  Result Value Ref Range Status   SARS Coronavirus 2 by RT PCR NEGATIVE NEGATIVE Final    Comment: (NOTE) SARS-CoV-2 target nucleic acids are NOT DETECTED.  The SARS-CoV-2 RNA is generally detectable in  upper and lower respiratory specimens during the acute phase of infection. The lowest concentration of SARS-CoV-2 viral copies this assay can detect is 250 copies / mL. A negative result does not preclude SARS-CoV-2 infection and should not be used as the sole basis for treatment or other patient management decisions.  A negative result may occur with improper specimen collection / handling, submission of specimen other than nasopharyngeal swab, presence of viral mutation(s) within the areas targeted by this assay, and inadequate number of viral copies (<250 copies / mL). A negative result must be combined with clinical observations, patient history, and epidemiological information.  Fact Sheet for Patients:   RoadLapTop.co.za  Fact  Sheet for Healthcare Providers: http://kim-miller.com/  This test is not yet approved or  cleared by the [place] and has been authorized for detection and/or diagnosis of SARS-CoV-2 by FDA under an Emergency Use Authorization (EUA).  This EUA will remain in effect (meaning this test can be used) for the duration of the COVID-19 declaration under Section 564(b)(1) of the Act, 21 U.S.C. section 360bbb-3(b)(1), unless the authorization is terminated or revoked sooner.  Performed at [facility], [address], [place], [place] [address]   Culture, blood (Routine x 2)     Status: None (Preliminary result)   Collection Time: 04/26/23  6:27 PM   Specimen: BLOOD  Result Value Ref Range Status   Specimen Description   Final    BLOOD LEFT ANTECUBITAL Performed at [facility], [address], [place], [place] [address]    Special Requests   Final    Blood Culture adequate volume BOTTLES DRAWN AEROBIC ONLY Performed at [facility], [address], [place], [place] [address]    Culture   Final    NO GROWTH 1 DAY Performed at [facility], [address] [place], [place] [address]    Report Status PENDING  Incomplete  C Difficile Quick Screen w PCR reflex     Status: None   Collection Time: 04/27/23  1:41 PM   Specimen: STOOL  Result Value Ref Range Status   C Diff antigen NEGATIVE NEGATIVE Final   C Diff toxin NEGATIVE NEGATIVE Final   C Diff interpretation No C. difficile detected.  Final    Comment: Performed at [facility], [address] [place], [place] [address]         Radiology Studies: DG Foot Complete Right  Result Date: 04/26/2023 CLINICAL DATA:  Cellulitis. EXAM: RIGHT FOOT COMPLETE - 3+ VIEW COMPARISON:  June 28, 2022. FINDINGS: Status post surgical amputation of distal first metatarsal and phalanges. There is dislocation of the second metatarsophalangeal joint. No definite lytic destruction or acute fracture is noted. IMPRESSION: Postsurgical changes as noted above. Dislocation of second metatarsophalangeal joint Electronically Signed   By: Lupita Raider M.D.   On: 04/26/2023 18:30   DG Chest 2 View  Result Date: 04/26/2023 CLINICAL DATA:  Possible sepsis. EXAM: CHEST - 2 VIEW COMPARISON:  None Available. FINDINGS: The heart size and mediastinal contours are within normal limits. Both lungs are clear. The visualized skeletal structures are unremarkable. IMPRESSION: No active cardiopulmonary disease. Electronically Signed   By: Lupita Raider M.D.   On: 04/26/2023 18:27        Scheduled Meds:  aspirin EC  81 mg Oral Daily   enoxaparin (LOVENOX) injection  30 mg Subcutaneous Q24H   fluticasone  1 spray Each Nare Daily   hydrocerin   Topical BID   insulin aspart  0-24 Units Subcutaneous Q4H   insulin detemir  12 Units Subcutaneous Daily   metoprolol succinate  25 mg Oral Daily   pantoprazole  40 mg Oral Daily   pravastatin  20 mg Oral QHS   Continuous Infusions:  ceFEPime (MAXIPIME) IV 2 g (04/27/23 1511)   doxycycline (VIBRAMYCIN) IV 100 mg (04/28/23 0609)    metronidazole 500 mg (04/28/23 0355)     LOS: 2 days    Time spent: 35 Minutes     Chattie Greeson A Raseel Jans, MD [facility]   If 7PM-7AM, please contact night-coverage [email or web address]  04/28/2023, 1:26 PM

## 2023-04-28 NOTE — TOC Initial Note (Signed)
Transition of Care [facility]) - Initial/Assessment Note    Patient Details  Name: Joy Tapia MRN: [id number] Date of Birth: March 17, 1941  Transition of Care [facility]) CM/SW Contact:    Amada Jupiter, LCSW Phone Number: 04/28/2023, 3:19 PM  Clinical Narrative:                  Met with pt who is alert x 1 and have left VM for her HC POA.  Noted that MD anticipating pt may be ready for dc tomorrow and have confirmed with MD that pt was admitted here from [facility] (not SNF) so need to confirm facility will accept directly back to ALF.  Have been able to speak with RN Lafonda Mosses) at [facility] who reports they can readmit as long as not requiring any IV abx (plan is to change to p.o. at dc).  Will follow up in the morning and speak with POA about dc, transportation back, etc.  Expected Discharge Plan: Assisted Living Barriers to Discharge: Continued Medical Work up   Patient Goals and CMS Choice Patient states their goals for this hospitalization and ongoing recovery are:: "go home"          Expected Discharge Plan and Services In-house Referral: Clinical Social Work     Living arrangements for the past 2 months: Assisted Living Facility ([facility])                                      Prior Living Arrangements/Services Living arrangements for the past 2 months: Assisted Living Facility ([facility]) Lives with:: Facility Resident Patient language and need for interpreter reviewed:: Yes Do you feel safe going back to the place where you live?: Yes      Need for Family Participation in Patient Care: No (Comment) Care giver support system in place?: Yes (comment)   Criminal Activity/Legal Involvement Pertinent to Current Situation/Hospitalization: No - Comment as needed  Activities of Daily Living   ADL Screening (condition at time of admission) Independently performs ADLs?: Yes (appropriate for developmental age) Is the patient deaf or have difficulty  hearing?: No Does the patient have difficulty seeing, even when wearing glasses/contacts?: No Does the patient have difficulty concentrating, remembering, or making decisions?: Yes  Permission Sought/Granted Permission sought to share information with : Other (comment) Permission granted to share information with : Yes, Verbal Permission Granted  Share Information with NAME: Medical POA, Eulah Pont @ [phone]  Permission granted to share info w AGENCY: [facility]        Emotional Assessment Appearance:: Appears stated age Attitude/Demeanor/Rapport: Gracious Affect (typically observed): Calm Orientation: : Oriented to Self Alcohol / Substance Use: Not Applicable Psych Involvement: No (comment)  Admission diagnosis:  Cellulitis [L03.90] Patient Active Problem List   Diagnosis Date Noted   Cellulitis 04/26/2023   CKD (chronic kidney disease) stage 4, GFR 15-29 ml/min (HCC) 06/29/2022   HTN (hypertension) 06/29/2022   Osteomyelitis (HCC) 06/28/2022   Diabetes mellitus (HCC) 04/29/2022   Mild cognitive impairment 06/26/2021   Gout 12/25/2013   Hyperlipidemia associated with type 2 diabetes mellitus (HCC) 12/25/2013   Hypertension in stage 4 chronic kidney disease due to type 2 diabetes mellitus (HCC) 12/25/2013   PCP:  Lauro Regulus, MD Pharmacy:   [facility] - [place], [place] - [address] [place] [address] Phone: [phone] Fax: [phone]     Social  Determinants of Health (SDOH) Social History: SDOH Screenings   Food Insecurity: No Food Insecurity (04/26/2023)  Housing: Low Risk  (04/26/2023)  Transportation Needs: No Transportation Needs (04/26/2023)  Utilities: Not At Risk (04/26/2023)  Tobacco Use: Medium Risk (04/26/2023)   SDOH Interventions:     Readmission Risk Interventions    04/28/2023    3:08 PM  Readmission Risk Prevention Plan  Transportation Screening Complete  PCP or Specialist Appt  within 5-7 Days Complete  Home Care Screening Complete  Medication Review (RN CM) Complete

## 2023-04-28 NOTE — Consult Note (Addendum)
WOC Nurse Consult Note: patient admitted for R foot pain with cellulitis; has a history of R great toe amputation; resides at [facility]  Reason for Consult: R plantar foot ulcer Wound type: full thickness diabetic/neuropathic ulcer  Pressure Injury POA: NA  Measurement: 2 cm x 2 cm plantar foot (between base of 2nd and 3rd metatarsal heads)  Wound bed: dark necrotic underneath intact skin, no open wound at this time  Drainage (amount, consistency, odor) none Periwound: heavily callused, foot with edema, erythema  Dressing procedure/placement/frequency: Clean R plantar foot ulcer with soap and water, dry and paint with Betadine 2 times daily.  Can cover with silicone foam if desired.    Patient currently being treated for cellulitis with IV antibiotics.  Patient would benefit from referral to podiatry/orthopedics for ongoing care of this ulcer.   Will also order Eucerin for dry scaly skin lower legs.   POC discussed with patient and bedside nurse. WOC team will not follow. Re-consult if further needs arise.   Thank you,    Priscella Mann MSN, RN-BC, [employer] [phone]

## 2023-04-29 DIAGNOSIS — A419 Sepsis, unspecified organism: Secondary | ICD-10-CM | POA: Diagnosis not present

## 2023-04-29 LAB — BASIC METABOLIC PANEL
Anion gap: 9 (ref 5–15)
BUN: 16 mg/dL (ref 8–23)
CO2: 20 mmol/L — ABNORMAL LOW (ref 22–32)
Calcium: 8.3 mg/dL — ABNORMAL LOW (ref 8.9–10.3)
Chloride: 111 mmol/L (ref 98–111)
Creatinine, Ser: 1.46 mg/dL — ABNORMAL HIGH (ref 0.44–1.00)
GFR, Estimated: 36 mL/min — ABNORMAL LOW (ref >60)
Glucose, Bld: 114 mg/dL — ABNORMAL HIGH (ref 70–99)
Potassium: 4 mmol/L (ref 3.5–5.1)
Sodium: 140 mmol/L (ref 135–145)

## 2023-04-29 LAB — GLUCOSE, CAPILLARY
Glucose-Capillary: 100 mg/dL — ABNORMAL HIGH (ref 70–99)
Glucose-Capillary: 126 mg/dL — ABNORMAL HIGH (ref 70–99)
Glucose-Capillary: 192 mg/dL — ABNORMAL HIGH (ref 70–99)
Glucose-Capillary: 211 mg/dL — ABNORMAL HIGH (ref 70–99)

## 2023-04-29 LAB — CREATININE, SERUM
Creatinine, Ser: 1.38 mg/dL — ABNORMAL HIGH (ref 0.44–1.00)
GFR, Estimated: 38 mL/min — ABNORMAL LOW (ref >60)

## 2023-04-29 MED ORDER — INSULIN DETEMIR 100 UNIT/ML ~~LOC~~ SOLN: 12.0000 [IU] | Freq: Every day | SUBCUTANEOUS | 11 refills | Status: DC

## 2023-04-29 MED ORDER — DOXYCYCLINE HYCLATE 100 MG PO TABS
100.0000 mg | ORAL_TABLET | Freq: Two times a day (BID) | ORAL | Status: DC
  Filled 2023-04-29: qty 1

## 2023-04-29 MED ORDER — LEVOFLOXACIN 250 MG PO TABS: 250.0000 mg | ORAL_TABLET | Freq: Every day | ORAL | 0 refills | Status: AC

## 2023-04-29 MED ORDER — DOXYCYCLINE HYCLATE 100 MG PO TABS: 100.0000 mg | ORAL_TABLET | Freq: Two times a day (BID) | ORAL | 0 refills | Status: AC

## 2023-04-29 MED ORDER — INSULIN SYRINGE 28G X 1/2" 0.5 ML MISC: 1.0000 | Freq: Every day | 1 refills | Status: DC

## 2023-04-29 MED ORDER — FUROSEMIDE 40 MG PO TABS: 40.0000 mg | ORAL_TABLET | Freq: Every day | ORAL | 0 refills | Status: DC

## 2023-04-29 MED ORDER — LEVOFLOXACIN 500 MG PO TABS
250.0000 mg | ORAL_TABLET | Freq: Every day | ORAL | Status: DC
  Administered 2023-04-29: 250 mg via ORAL
  Filled 2023-04-29: qty 1

## 2023-04-29 MED ORDER — SYRINGE 18G X 1-1/2" 3 ML MISC: 1.0000 | Freq: Every day | 0 refills | Status: DC

## 2023-04-29 MED ORDER — INSULIN SYRINGE 28G X 1/2" 0.5 ML MISC: 1.0000 | Freq: Every day | 1 refills | Status: AC

## 2023-04-29 NOTE — Care Management Important Message (Signed)
Important Message  Patient Details IM Letter given. Name: Joy Tapia MRN: [id number] Date of Birth: 1941-04-18   Important Message Given:  Yes - Medicare IM     Caren Macadam 04/29/2023, 11:32 AM

## 2023-04-29 NOTE — Discharge Summary (Signed)
Physician Discharge Summary   Patient: Joy Tapia MRN: [id number] DOB: 1941/05/15  Admit date:     04/26/2023  Discharge date: 04/29/23  Discharge Physician: Alba Cory   PCP: Lauro Regulus, MD   Recommendations at discharge:    Needs follow up with Podiatry for foot ulcer.  Monitor CBG, needs better Blood sugar controlled.   Discharge Diagnoses: Principal Problem:   Cellulitis  Resolved Problems:   * No resolved hospital problems. [facility] Course: 81 year old with past medical history significant for DVT, gout, hyperlipidemia, hypertension presents with foot redness, and fever.  History limited due to patient's dementia.  Patient has a prior history of foot fracture and osteomyelitis that required amputation in the past.  She has been more somnolent and tired at her facility.  She presented febrile with a temperature of 102, leukocytosis, lactic acid 2.9, AKI with a creatinine of 1.7.  Patient admitted for foot cellulitis.    Assessment and Plan: 1-Sepsis secondary to right foot cellulitis, diabetic foot infection -Patient presents with fever, leukocytosis, lactic acidosis lactic acid 2.9 in the setting of foot infection. -\Treated  with cefepime and flagyl.  -IV fluids.  Change vancomycin to doxy due to CKD  Redness improving.  Needs to follow up with Podiatry out patient for foot ulcer.  Discharge on Doxy and Levaquin for 5-7 days.  Redness improving.    CKD stage IIIb -Prior Cr at 1.4--1.6 -Monitor renal function.  Cr down to 1.4 Resume lower home dose lasix.   Diabetes type 2: Hyperglycemia uncontrolled Resume glipizide and discharge on Levemir   History of cognitive impairment: -B 12: elevated.      CAD: Continue with aspirin     Hypertension: Continue with metoprolol.     GERD: Continue with Protonix     Hyperlipidemia: Continue with the statins   Diarrhea; C diff negative. resolved   Hypokalemia; Replaced.            Consultants: None Procedures performed: None Disposition: Assisted living Diet recommendation:  Discharge Diet Orders (From admission, onward)     Start     Ordered   04/29/23 0000  Diet Carb Modified        04/29/23 0954           Carb modified diet DISCHARGE MEDICATION: Allergies as of 04/29/2023       Reactions   Atorvastatin Diarrhea   Azithromycin Other (See Comments)   Pt states burns her insides.   Erythromycin Other (See Comments)   Propoxyphene Other (See Comments)        Medication List     STOP taking these medications    vitamin B-12 500 MCG tablet Commonly known as: CYANOCOBALAMIN       TAKE these medications    acetaminophen 325 MG tablet Commonly known as: TYLENOL Take 650 mg by mouth every 8 (eight) hours as needed.   aspirin EC 81 MG tablet Take 81 mg by mouth daily.   cetirizine 10 MG tablet Commonly known as: ZYRTEC Take 10 mg by mouth daily.   doxycycline 100 MG tablet Commonly known as: VIBRA-TABS Take 1 tablet (100 mg total) by mouth every 12 (twelve) hours for 7 days.   Febuxostat 80 MG Tabs Take 1 tablet by mouth daily.   fluticasone 50 MCG/ACT nasal spray Commonly known as: FLONASE Place 1 spray into both nostrils daily as needed for allergies or rhinitis.   furosemide 40 MG tablet Commonly known as: LASIX Take 1 tablet (  40 mg total) by mouth daily. What changed: when to take this   glimepiride 4 MG tablet Commonly known as: AMARYL Take 4 mg by mouth daily with breakfast.   insulin detemir 100 UNIT/ML injection Commonly known as: LEVEMIR Inject 0.12 mLs (12 Units total) into the skin daily.   levofloxacin 250 MG tablet Commonly known as: LEVAQUIN Take 1 tablet (250 mg total) by mouth daily for 5 days.   metoprolol succinate 50 MG 24 hr tablet Commonly known as: TOPROL-XL Take 50 mg by mouth daily.   multivitamin capsule Take 1 capsule by mouth daily.   omeprazole 20 MG capsule Commonly known as:  PRILOSEC Take 20 mg by mouth daily.   pravastatin 20 MG tablet Commonly known as: PRAVACHOL Take 20 mg by mouth at bedtime.               Discharge Care Instructions  (From admission, onward)           Start     Ordered   04/29/23 0000  Discharge wound care:       Comments: See above   04/29/23 1610            Follow-up Information     [name], DPM Follow up in 1 week(s).   Specialty: Podiatry Contact information: [address] [place] [address] [phone]                Discharge Exam: Ceasar Mons Weights   04/26/23 1829  Weight: 58.1 kg   General; NAD Right foot less redness Condition at discharge: stable  The results of significant diagnostics from this hospitalization (including imaging, microbiology, ancillary and laboratory) are listed below for reference.   Imaging Studies: DG Foot Complete Right  Result Date: 04/26/2023 CLINICAL DATA:  Cellulitis. EXAM: RIGHT FOOT COMPLETE - 3+ VIEW COMPARISON:  June 28, 2022. FINDINGS: Status post surgical amputation of distal first metatarsal and phalanges. There is dislocation of the second metatarsophalangeal joint. No definite lytic destruction or acute fracture is noted. IMPRESSION: Postsurgical changes as noted above. Dislocation of second metatarsophalangeal joint Electronically Signed   By: Lupita Raider M.D.   On: 04/26/2023 18:30   DG Chest 2 View  Result Date: 04/26/2023 CLINICAL DATA:  Possible sepsis. EXAM: CHEST - 2 VIEW COMPARISON:  None Available. FINDINGS: The heart size and mediastinal contours are within normal limits. Both lungs are clear. The visualized skeletal structures are unremarkable. IMPRESSION: No active cardiopulmonary disease. Electronically Signed   By: Lupita Raider M.D.   On: 04/26/2023 18:27    Microbiology: Results for orders placed or performed during the hospital encounter of 04/26/23  Culture, blood (Routine x 2)     Status: None (Preliminary  result)   Collection Time: 04/26/23  5:58 PM   Specimen: BLOOD  Result Value Ref Range Status   Specimen Description   Final    BLOOD BLOOD LEFT WRIST Performed at [facility], [address] [place], [place] [address]    Special Requests   Final    BOTTLES DRAWN AEROBIC ONLY Blood Culture adequate volume Performed at [facility], [address] [place], [place] [address]    Culture   Final    NO GROWTH 2 DAYS Performed at [facility], [address] [place], [place] [address]    Report Status PENDING  Incomplete  SARS Coronavirus 2 by RT PCR (hospital order, performed in [facility] hospital lab) *cepheid single result  test* Anterior Nasal Swab     Status: None   Collection Time: 04/26/23  6:06 PM   Specimen: Anterior Nasal Swab  Result Value Ref Range Status   SARS Coronavirus 2 by RT PCR NEGATIVE NEGATIVE Final    Comment: (NOTE) SARS-CoV-2 target nucleic acids are NOT DETECTED.  The SARS-CoV-2 RNA is generally detectable in upper and lower respiratory specimens during the acute phase of infection. The lowest concentration of SARS-CoV-2 viral copies this assay can detect is 250 copies / mL. A negative result does not preclude SARS-CoV-2 infection and should not be used as the sole basis for treatment or other patient management decisions.  A negative result may occur with improper specimen collection / handling, submission of specimen other than nasopharyngeal swab, presence of viral mutation(s) within the areas targeted by this assay, and inadequate number of viral copies (<250 copies / mL). A negative result must be combined with clinical observations, patient history, and epidemiological information.  Fact Sheet for Patients:   RoadLapTop.co.za  Fact Sheet for Healthcare Providers: http://kim-miller.com/  This test is not yet approved or  cleared by the [place] FDA  and has been authorized for detection and/or diagnosis of SARS-CoV-2 by FDA under an Emergency Use Authorization (EUA).  This EUA will remain in effect (meaning this test can be used) for the duration of the COVID-19 declaration under Section 564(b)(1) of the Act, 21 U.S.C. section 360bbb-3(b)(1), unless the authorization is terminated or revoked sooner.  Performed at [facility], [address] [place], [place] [address]   Culture, blood (Routine x 2)     Status: None (Preliminary result)   Collection Time: 04/26/23  6:27 PM   Specimen: BLOOD  Result Value Ref Range Status   Specimen Description   Final    BLOOD LEFT ANTECUBITAL Performed at [facility], [address] [place], [place] [address]    Special Requests   Final    Blood Culture adequate volume BOTTLES DRAWN AEROBIC ONLY Performed at [facility], [address] [place], [place] [address]    Culture   Final    NO GROWTH 2 DAYS Performed at [facility], [address], [place], [place] [address]    Report Status PENDING  Incomplete  Urine Culture     Status: None   Collection Time: 04/27/23 11:59 AM   Specimen: Urine, Random  Result Value Ref Range Status   Specimen Description   Final    URINE, RANDOM Performed at [facility], [address] [place], [place] [address]    Special Requests   Final    NONE Reflexed from [id number] Performed at [facility], [address] [place], [place] [address]    Culture   Final    NO GROWTH Performed at [facility], [address] [place], [place] [address]    Report Status 04/28/2023 FINAL  Final  C Difficile Quick Screen w PCR reflex     Status: None   Collection Time: 04/27/23  1:41 PM   Specimen: STOOL  Result Value Ref Range Status   C Diff antigen NEGATIVE NEGATIVE Final   C Diff toxin NEGATIVE NEGATIVE Final   C Diff interpretation No C. difficile  detected.  Final    Comment: Performed at [facility], [address] [place], [place] [address]    Labs: CBC: Recent Labs  Lab 04/26/23 1747 04/26/23 2247 04/28/23 4401  WBC 12.9* 13.8* 10.1  NEUTROABS 10.8*  --   --   HGB 12.6 11.9* 10.4*  HCT 37.8 35.5* 32.3*  MCV 87.5 86.6 88.5  PLT 235 229 233   Basic Metabolic Panel: Recent Labs  Lab 04/26/23 2035 04/26/23 2247 04/27/23 0318 04/28/23 0308 04/28/23 0807 04/29/23 0338 04/29/23 0358  NA 130*  --   --   --  136 140  --   K 3.5  --   --   --  3.3* 4.0  --   CL 98  --   --   --  107 111  --   CO2 21*  --   --   --  20* 20*  --   GLUCOSE 389*  --   --   --  230* 114*  --   BUN 31*  --   --   --  21 16  --   CREATININE 1.79*   < > 1.66* 1.89* 1.71* 1.46* 1.38*  CALCIUM 7.6*  --   --   --  8.0* 8.3*  --    < > = values in this interval not displayed.   Liver Function Tests: Recent Labs  Lab 04/26/23 2035  AST 26  ALT 23  ALKPHOS 88  BILITOT 0.3  PROT 6.0*  ALBUMIN 2.7*   CBG: Recent Labs  Lab 04/28/23 1708 04/28/23 2019 04/29/23 0000 04/29/23 0340 04/29/23 0808  GLUCAP 88 159* 126* 100* 211*    Discharge time spent: greater than 30 minutes.  Signed: Alba Cory, MD [facility] 04/29/2023

## 2023-04-29 NOTE — Discharge Instructions (Signed)
Clean R plantar foot ulcer with soap and water, dry and paint with Betadine 2 times daily.  Can cover with silicone foam if desired.

## 2023-04-29 NOTE — Evaluation (Signed)
Occupational Therapy Evaluation and Discharge Patient Details Name: Joy Tapia MRN: [id number] DOB: [birth year] Today's Date: 04/29/2023   History of Present Illness 82 yo female presented with foot redness, and fever, admitted with cellulitis R LE . PMH: DVT, gout, hyperlipidemia, hypertension, RLE great toe amputation.   Clinical Impression   This 82 yo female admitted with above presents to acute OT at an overall S level at RW level due to her cognitive status--which appears to be her baseline since she is at ALF. No further OT needs, we will sign off.      If plan is discharge home, recommend the following: Supervision due to cognitive status    Functional Status Assessment  Patient has not had a recent decline in their functional status  Equipment Recommendations  None recommended by OT       Precautions / Restrictions Precautions Precautions: Fall Restrictions Weight Bearing Restrictions: No      Mobility Bed Mobility               General bed mobility comments: pt up in recliner upon arrival    Transfers Overall transfer level: Needs assistance Equipment used: Rolling walker (2 wheels) Transfers: Sit to/from Stand Sit to Stand: Supervision                  Balance Overall balance assessment: Needs assistance Sitting-balance support: No upper extremity supported, Feet supported Sitting balance-Leahy Scale: Good     Standing balance support: No upper extremity supported, During functional activity Standing balance-Leahy Scale: Fair Standing balance comment: standing at sink to wash hands                           ADL either performed or assessed with clinical judgement   ADL                                         General ADL Comments: overall at a S level at RW level     Vision Patient Visual Report: No change from baseline              Pertinent Vitals/Pain Pain Assessment Pain Assessment:  Faces Faces Pain Scale: Hurts little more Pain Location: left forearm (IV). RN to check and removed it (heat applied) Pain Descriptors / Indicators: Sore Pain Intervention(s): Heat applied, Limited activity within patient's tolerance, Repositioned     Extremity/Trunk Assessment Upper Extremity Assessment Upper Extremity Assessment: Overall WFL for tasks assessed           Communication Communication Communication: No apparent difficulties   Cognition Arousal: Alert Behavior During Therapy: WFL for tasks assessed/performed Overall Cognitive Status: History of cognitive impairments - at baseline                                 General Comments: When asked why she was here, she was able to tell me about her RLE; then once in the bathroom she asked, "why am I here"                Home Living Family/patient expects to be discharged to:: Assisted living ([facility])                             Home Equipment:  Rolling Walker (2 wheels);Cane - single point;Crutches;Shower seat   Additional Comments: per pt she " I do not need the walker but they insist I use it"      Prior Functioning/Environment Prior Level of Function : Independent/Modified Independent;Driving                        OT Problem List: Impaired balance (sitting and/or standing)         OT Goals(Current goals can be found in the care plan section) Acute Rehab OT Goals Patient Stated Goal: to go home today         AM-PAC OT "6 Clicks" Daily Activity     Outcome Measure Help from another person eating meals?: None Help from another person taking care of personal grooming?: A Little Help from another person toileting, which includes using toliet, bedpan, or urinal?: A Little Help from another person bathing (including washing, rinsing, drying)?: A Little Help from another person to put on and taking off regular upper body clothing?: A Little Help from another person to  put on and taking off regular lower body clothing?: A Little 6 Click Score: 19   End of Session Equipment Utilized During Treatment: Rolling walker (2 wheels) Nurse Communication:  (foot bleeding and IV site painful (dressing changed and IV removed by RN))  Activity Tolerance: Patient tolerated treatment well Patient left: in chair;with call bell/phone within reach;with chair alarm set  OT Visit Diagnosis: Unsteadiness on feet (R26.81)                Time: 9563-8756 OT Time Calculation (min): 36 min Charges:  OT General Charges $OT Visit: 1 Visit OT Evaluation $OT Eval Moderate Complexity: 1 Mod OT Treatments $Self Care/Home Management : 8-22 mins  Joy Tapia OT Acute Rehabilitation Services Office [phone]    Evette Georges 04/29/2023, 9:15 AM

## 2023-04-29 NOTE — Progress Notes (Signed)
Physical Therapy Treatment Patient Details Name: Joy Tapia MRN: [id number] DOB: [birth year] Today's Date: 04/29/2023   History of Present Illness 82 yo female presented with foot redness, and fever, admitted with cellulitis R LE . PMH: DVT, gout, hyperlipidemia, hypertension, RLE great toe amputation.    PT Comments  Pt scooting out of recliner on arrival, easily redirected. Assisted to bathroom at pt request, amb hallway distance with no device to HHA, intermittent assist needed for balance (without RW support). Continue to follow in acute setting   If plan is discharge home, recommend the following: Help with stairs or ramp for entrance;Assist for transportation;Supervision due to cognitive status   Can travel by private vehicle        Equipment Recommendations  None recommended by PT    Recommendations for Other Services       Precautions / Restrictions Precautions Precautions: Fall Restrictions Weight Bearing Restrictions: No     Mobility  Bed Mobility               General bed mobility comments: pt up in recliner upon arrival    Transfers Overall transfer level: Needs assistance Equipment used: None Transfers: Sit to/from Stand Sit to Stand: Contact guard assist, Supervision           General transfer comment: cues and CGA to supervision  for safety; STS from toilet and recliner    Ambulation/Gait Ambulation/Gait assistance: Contact guard assist, Min assist Gait Distance (Feet): 200 Feet Assistive device: None, 1 person hand held assist Gait Pattern/deviations: Drifts right/left, Step-through pattern       General Gait Details: no device to HHA  for stability. pt unsteady at times, mild drifting noted and min assist to recover balance x2   Stairs             Wheelchair Mobility     Tilt Bed    Modified Rankin (Stroke Patients Only)       Balance   Sitting-balance support: No upper extremity supported, Feet  supported Sitting balance-Leahy Scale: Good     Standing balance support: No upper extremity supported, During functional activity Standing balance-Leahy Scale: Fair Standing balance comment: pericare and standing at sink to wash hands                            Cognition Arousal: Alert Behavior During Therapy: WFL for tasks assessed/performed Overall Cognitive Status: History of cognitive impairments - at baseline                                 General Comments: STM deficits, pt recalls that she is in  hospital after mobilizing outside the room        Exercises      General Comments        Pertinent Vitals/Pain Pain Assessment Pain Assessment: No/denies pain    Home Living Family/patient expects to be discharged to:: Assisted living ([facility])                 Home Equipment: Agricultural consultant (2 wheels);Cane - single point;Crutches;Shower seat Additional Comments: per pt she " I do not need the walker but they insist I use it"    Prior Function            PT Goals (current goals can now be found in the care plan section) Acute Rehab PT Goals PT Goal Formulation:  With patient Time For Goal Achievement: 05/05/23 Potential to Achieve Goals: Good Progress towards PT goals: Progressing toward goals    Frequency    Min 1X/week      PT Plan      Co-evaluation              AM-PAC PT "6 Clicks" Mobility   Outcome Measure  Help needed turning from your back to your side while in a flat bed without using bedrails?: None Help needed moving from lying on your back to sitting on the side of a flat bed without using bedrails?: None Help needed moving to and from a bed to a chair (including a wheelchair)?: A Little Help needed standing up from a chair using your arms (e.g., wheelchair or bedside chair)?: A Little Help needed to walk in hospital room?: A Little Help needed climbing 3-5 steps with a railing? : A Little 6 Click  Score: 20    End of Session Equipment Utilized During Treatment: Gait belt Activity Tolerance: Patient tolerated treatment well Patient left: in chair;with call bell/phone within reach;with chair alarm set   PT Visit Diagnosis: Other abnormalities of gait and mobility (R26.89)     Time: 0865-7846 PT Time Calculation (min) (ACUTE ONLY): 14 min  Charges:    $Gait Training: 8-22 mins PT General Charges $$ ACUTE PT VISIT: 1 Visit                     Jaquese Irving, PT  Acute Rehab Dept [facility]) [phone]  04/29/2023    [facility] 04/29/2023, 12:18 PM

## 2023-04-29 NOTE — TOC Transition Note (Signed)
Transition of Care [facility]) - CM/SW Discharge Note   Patient Details  Name: Joy Tapia MRN: [id number] Date of Birth: [birth year]  Transition [facility]) CM/SW Contact:  Amada Jupiter, LCSW Phone Number: 04/29/2023, 1:14 PM   Clinical Narrative:     Pt medically cleared for dc back to [facility] today.   Able to speak with pt's HC POA, Cathy Schweitzer, this morning who is aware and plans to provide pt's transportation back to facility this afternoon.  Facility notes no FL2 needed.  Clear for return.  No further TOC needs.  Final next level of care: Assisted Living Barriers to Discharge: Barriers Resolved   Patient Goals and CMS Choice      Discharge Placement                         Discharge Plan and Services Additional resources added to the After Visit Summary for   In-house Referral: Clinical Social Work              DME Arranged: N/A DME Agency: NA                  Social Determinants of Health (SDOH) Interventions SDOH Screenings   Food Insecurity: No Food Insecurity (04/26/2023)  Housing: Low Risk  (04/26/2023)  Transportation Needs: No Transportation Needs (04/26/2023)  Utilities: Not At Risk (04/26/2023)  Tobacco Use: Medium Risk (04/26/2023)     Readmission Risk Interventions    04/28/2023    3:08 PM  Readmission Risk Prevention Plan  Transportation Screening Complete  PCP or Specialist Appt within 5-7 Days Complete  Home Care Screening Complete  Medication Review (RN CM) Complete

## 2023-04-30 DIAGNOSIS — J302 Other seasonal allergic rhinitis: Secondary | ICD-10-CM | POA: Diagnosis not present

## 2023-04-30 DIAGNOSIS — L03116 Cellulitis of left lower limb: Secondary | ICD-10-CM | POA: Diagnosis not present

## 2023-04-30 DIAGNOSIS — E1165 Type 2 diabetes mellitus with hyperglycemia: Secondary | ICD-10-CM | POA: Diagnosis not present

## 2023-05-02 LAB — CULTURE, BLOOD (ROUTINE X 2)
Culture: NO GROWTH
Culture: NO GROWTH
Special Requests: ADEQUATE
Special Requests: ADEQUATE

## 2023-05-05 DIAGNOSIS — F330 Major depressive disorder, recurrent, mild: Secondary | ICD-10-CM | POA: Diagnosis not present

## 2023-05-06 DIAGNOSIS — R059 Cough, unspecified: Secondary | ICD-10-CM | POA: Diagnosis not present

## 2023-05-06 DIAGNOSIS — Z79899 Other long term (current) drug therapy: Secondary | ICD-10-CM | POA: Diagnosis not present

## 2023-05-06 DIAGNOSIS — Q791 Other congenital malformations of diaphragm: Secondary | ICD-10-CM | POA: Diagnosis not present

## 2023-05-07 DIAGNOSIS — E11621 Type 2 diabetes mellitus with foot ulcer: Secondary | ICD-10-CM | POA: Diagnosis not present

## 2023-05-07 DIAGNOSIS — R0600 Dyspnea, unspecified: Secondary | ICD-10-CM | POA: Diagnosis not present

## 2023-05-07 DIAGNOSIS — N184 Chronic kidney disease, stage 4 (severe): Secondary | ICD-10-CM | POA: Diagnosis not present

## 2023-05-07 DIAGNOSIS — E1122 Type 2 diabetes mellitus with diabetic chronic kidney disease: Secondary | ICD-10-CM | POA: Diagnosis not present

## 2023-05-08 ENCOUNTER — Ambulatory Visit: Payer: PPO | Admitting: Podiatry

## 2023-05-08 DIAGNOSIS — L97511 Non-pressure chronic ulcer of other part of right foot limited to breakdown of skin: Secondary | ICD-10-CM

## 2023-05-08 MED ORDER — DOXYCYCLINE HYCLATE 100 MG PO TABS: 100.0000 mg | ORAL_TABLET | Freq: Two times a day (BID) | ORAL | 1 refills | Status: DC

## 2023-05-08 MED ORDER — MUPIROCIN 2 % EX OINT: 1.0000 | TOPICAL_OINTMENT | Freq: Two times a day (BID) | CUTANEOUS | 1 refills | Status: DC
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
## 2023-05-11 DIAGNOSIS — Z79899 Other long term (current) drug therapy: Secondary | ICD-10-CM | POA: Diagnosis not present

## 2023-05-11 DIAGNOSIS — R059 Cough, unspecified: Secondary | ICD-10-CM | POA: Diagnosis not present

## 2023-05-12 DIAGNOSIS — F330 Major depressive disorder, recurrent, mild: Secondary | ICD-10-CM | POA: Diagnosis not present

## 2023-05-13 ENCOUNTER — Other Ambulatory Visit: Payer: Self-pay | Admitting: Podiatry

## 2023-05-22 ENCOUNTER — Encounter: Payer: Self-pay | Admitting: Podiatry

## 2023-05-22 ENCOUNTER — Ambulatory Visit (INDEPENDENT_AMBULATORY_CARE_PROVIDER_SITE_OTHER): Payer: PPO | Admitting: Podiatry

## 2023-05-22 ENCOUNTER — Ambulatory Visit: Payer: PPO

## 2023-05-22 DIAGNOSIS — E119 Type 2 diabetes mellitus without complications: Secondary | ICD-10-CM | POA: Diagnosis not present

## 2023-05-22 DIAGNOSIS — L03031 Cellulitis of right toe: Secondary | ICD-10-CM

## 2023-05-22 DIAGNOSIS — E1165 Type 2 diabetes mellitus with hyperglycemia: Secondary | ICD-10-CM | POA: Diagnosis not present

## 2023-05-22 DIAGNOSIS — L97511 Non-pressure chronic ulcer of other part of right foot limited to breakdown of skin: Secondary | ICD-10-CM

## 2023-05-22 DIAGNOSIS — L97512 Non-pressure chronic ulcer of other part of right foot with fat layer exposed: Secondary | ICD-10-CM

## 2023-05-22 DIAGNOSIS — I129 Hypertensive chronic kidney disease with stage 1 through stage 4 chronic kidney disease, or unspecified chronic kidney disease: Secondary | ICD-10-CM | POA: Diagnosis not present

## 2023-05-22 DIAGNOSIS — D518 Other vitamin B12 deficiency anemias: Secondary | ICD-10-CM | POA: Diagnosis not present

## 2023-05-22 MED ORDER — DOXYCYCLINE HYCLATE 100 MG PO TABS: 100.0000 mg | ORAL_TABLET | Freq: Two times a day (BID) | ORAL | 0 refills | Status: AC

## 2023-05-22 NOTE — Patient Instructions (Signed)
More silicone pads and felt pads can be purchased from:  [email or web address]
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
## 2023-05-23 DIAGNOSIS — G8929 Other chronic pain: Secondary | ICD-10-CM | POA: Diagnosis not present

## 2023-05-23 DIAGNOSIS — G3184 Mild cognitive impairment, so stated: Secondary | ICD-10-CM | POA: Diagnosis not present

## 2023-05-23 DIAGNOSIS — F330 Major depressive disorder, recurrent, mild: Secondary | ICD-10-CM | POA: Diagnosis not present

## 2023-05-26 DIAGNOSIS — F330 Major depressive disorder, recurrent, mild: Secondary | ICD-10-CM | POA: Diagnosis not present

## 2023-06-05 ENCOUNTER — Ambulatory Visit (INDEPENDENT_AMBULATORY_CARE_PROVIDER_SITE_OTHER): Payer: Medicare Other

## 2023-06-05 ENCOUNTER — Ambulatory Visit (INDEPENDENT_AMBULATORY_CARE_PROVIDER_SITE_OTHER): Payer: Medicare Other | Admitting: Podiatry

## 2023-06-05 ENCOUNTER — Encounter: Payer: Self-pay | Admitting: Podiatry

## 2023-06-05 DIAGNOSIS — L97514 Non-pressure chronic ulcer of other part of right foot with necrosis of bone: Secondary | ICD-10-CM

## 2023-06-05 DIAGNOSIS — M778 Other enthesopathies, not elsewhere classified: Secondary | ICD-10-CM | POA: Diagnosis not present

## 2023-06-05 DIAGNOSIS — L97522 Non-pressure chronic ulcer of other part of left foot with fat layer exposed: Secondary | ICD-10-CM

## 2023-06-05 DIAGNOSIS — E11621 Type 2 diabetes mellitus with foot ulcer: Secondary | ICD-10-CM

## 2023-06-05 DIAGNOSIS — E1140 Type 2 diabetes mellitus with diabetic neuropathy, unspecified: Secondary | ICD-10-CM

## 2023-06-05 MED ORDER — DOXYCYCLINE HYCLATE 100 MG PO TABS
100.0000 mg | ORAL_TABLET | Freq: Two times a day (BID) | ORAL | 0 refills | Status: DC
Start: 2023-06-05 — End: 2023-06-19

## 2023-06-05 MED ORDER — DOXYCYCLINE HYCLATE 100 MG PO TABS: 100.0000 mg | ORAL_TABLET | Freq: Two times a day (BID) | ORAL | 0 refills | Status: DC

## 2023-06-05 MED ORDER — MUPIROCIN 2 % EX OINT: 1.0000 | TOPICAL_OINTMENT | Freq: Two times a day (BID) | CUTANEOUS | 0 refills | Status: DC
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
## 2023-06-06 ENCOUNTER — Telehealth: Payer: Self-pay

## 2023-06-06 NOTE — Telephone Encounter (Signed)
 Donny called and left a message late yesterday afternoon. Patient is in assisted living facility - the do no do wound care - we need a referral to [facility] home [facility] with specific wound care instructions -  If you will put in the order, I'll get the referral faxed asap thanks

## 2023-06-10 ENCOUNTER — Other Ambulatory Visit: Payer: Self-pay | Admitting: Podiatry

## 2023-06-10 DIAGNOSIS — L97514 Non-pressure chronic ulcer of other part of right foot with necrosis of bone: Secondary | ICD-10-CM

## 2023-06-12 ENCOUNTER — Encounter: Payer: Self-pay | Admitting: Podiatry

## 2023-06-12 ENCOUNTER — Encounter (HOSPITAL_COMMUNITY): Payer: Self-pay

## 2023-06-12 ENCOUNTER — Ambulatory Visit: Payer: PPO | Admitting: Podiatry

## 2023-06-12 ENCOUNTER — Ambulatory Visit (INDEPENDENT_AMBULATORY_CARE_PROVIDER_SITE_OTHER): Payer: PPO

## 2023-06-12 ENCOUNTER — Inpatient Hospital Stay (HOSPITAL_COMMUNITY)
Admission: EM | Admit: 2023-06-12 | Discharge: 2023-06-19 | DRG: 617 | Disposition: A | Discharge status: Home Health | Payer: Medicare Other | Attending: Internal Medicine | Admitting: Internal Medicine

## 2023-06-12 ENCOUNTER — Other Ambulatory Visit: Payer: Self-pay

## 2023-06-12 ENCOUNTER — Emergency Department (HOSPITAL_COMMUNITY): Payer: Medicare Other

## 2023-06-12 DIAGNOSIS — L97522 Non-pressure chronic ulcer of other part of left foot with fat layer exposed: Secondary | ICD-10-CM

## 2023-06-12 DIAGNOSIS — M879 Osteonecrosis, unspecified: Secondary | ICD-10-CM | POA: Diagnosis present

## 2023-06-12 DIAGNOSIS — M810 Age-related osteoporosis without current pathological fracture: Secondary | ICD-10-CM | POA: Diagnosis present

## 2023-06-12 DIAGNOSIS — M216X1 Other acquired deformities of right foot: Secondary | ICD-10-CM

## 2023-06-12 DIAGNOSIS — Z96651 Presence of right artificial knee joint: Secondary | ICD-10-CM | POA: Diagnosis present

## 2023-06-12 DIAGNOSIS — M86171 Other acute osteomyelitis, right ankle and foot: Secondary | ICD-10-CM

## 2023-06-12 DIAGNOSIS — Z87891 Personal history of nicotine dependence: Secondary | ICD-10-CM

## 2023-06-12 DIAGNOSIS — L97514 Non-pressure chronic ulcer of other part of right foot with necrosis of bone: Secondary | ICD-10-CM | POA: Diagnosis not present

## 2023-06-12 DIAGNOSIS — Z7982 Long term (current) use of aspirin: Secondary | ICD-10-CM

## 2023-06-12 DIAGNOSIS — Z794 Long term (current) use of insulin: Secondary | ICD-10-CM

## 2023-06-12 DIAGNOSIS — N184 Chronic kidney disease, stage 4 (severe): Secondary | ICD-10-CM | POA: Diagnosis present

## 2023-06-12 DIAGNOSIS — L03115 Cellulitis of right lower limb: Secondary | ICD-10-CM | POA: Diagnosis not present

## 2023-06-12 DIAGNOSIS — F0390 Unspecified dementia without behavioral disturbance: Secondary | ICD-10-CM | POA: Diagnosis present

## 2023-06-12 DIAGNOSIS — Z91199 Patient's noncompliance with other medical treatment and regimen due to unspecified reason: Secondary | ICD-10-CM

## 2023-06-12 DIAGNOSIS — Z79899 Other long term (current) drug therapy: Secondary | ICD-10-CM

## 2023-06-12 DIAGNOSIS — L97519 Non-pressure chronic ulcer of other part of right foot with unspecified severity: Secondary | ICD-10-CM | POA: Diagnosis present

## 2023-06-12 DIAGNOSIS — Z881 Allergy status to other antibiotic agents status: Secondary | ICD-10-CM

## 2023-06-12 DIAGNOSIS — E11621 Type 2 diabetes mellitus with foot ulcer: Secondary | ICD-10-CM

## 2023-06-12 DIAGNOSIS — E119 Type 2 diabetes mellitus without complications: Secondary | ICD-10-CM

## 2023-06-12 DIAGNOSIS — I10 Essential (primary) hypertension: Secondary | ICD-10-CM | POA: Diagnosis present

## 2023-06-12 DIAGNOSIS — M1990 Unspecified osteoarthritis, unspecified site: Secondary | ICD-10-CM | POA: Diagnosis present

## 2023-06-12 DIAGNOSIS — E1140 Type 2 diabetes mellitus with diabetic neuropathy, unspecified: Secondary | ICD-10-CM

## 2023-06-12 DIAGNOSIS — L03031 Cellulitis of right toe: Secondary | ICD-10-CM | POA: Diagnosis not present

## 2023-06-12 DIAGNOSIS — E1169 Type 2 diabetes mellitus with other specified complication: Secondary | ICD-10-CM | POA: Diagnosis not present

## 2023-06-12 DIAGNOSIS — E785 Hyperlipidemia, unspecified: Secondary | ICD-10-CM | POA: Diagnosis present

## 2023-06-12 DIAGNOSIS — Z7984 Long term (current) use of oral hypoglycemic drugs: Secondary | ICD-10-CM

## 2023-06-12 DIAGNOSIS — M21961 Unspecified acquired deformity of right lower leg: Secondary | ICD-10-CM | POA: Diagnosis present

## 2023-06-12 DIAGNOSIS — M1A9XX1 Chronic gout, unspecified, with tophus (tophi): Secondary | ICD-10-CM | POA: Diagnosis present

## 2023-06-12 DIAGNOSIS — I129 Hypertensive chronic kidney disease with stage 1 through stage 4 chronic kidney disease, or unspecified chronic kidney disease: Secondary | ICD-10-CM | POA: Diagnosis present

## 2023-06-12 DIAGNOSIS — Z888 Allergy status to other drugs, medicaments and biological substances status: Secondary | ICD-10-CM

## 2023-06-12 DIAGNOSIS — Z89411 Acquired absence of right great toe: Secondary | ICD-10-CM

## 2023-06-12 DIAGNOSIS — Z9049 Acquired absence of other specified parts of digestive tract: Secondary | ICD-10-CM

## 2023-06-12 DIAGNOSIS — M869 Osteomyelitis, unspecified: Secondary | ICD-10-CM | POA: Diagnosis present

## 2023-06-12 DIAGNOSIS — L089 Local infection of the skin and subcutaneous tissue, unspecified: Secondary | ICD-10-CM | POA: Diagnosis present

## 2023-06-12 DIAGNOSIS — E1122 Type 2 diabetes mellitus with diabetic chronic kidney disease: Secondary | ICD-10-CM | POA: Diagnosis present

## 2023-06-12 DIAGNOSIS — E11628 Type 2 diabetes mellitus with other skin complications: Secondary | ICD-10-CM | POA: Diagnosis present

## 2023-06-12 DIAGNOSIS — E1159 Type 2 diabetes mellitus with other circulatory complications: Secondary | ICD-10-CM | POA: Diagnosis present

## 2023-06-12 DIAGNOSIS — K219 Gastro-esophageal reflux disease without esophagitis: Secondary | ICD-10-CM | POA: Diagnosis present

## 2023-06-12 DIAGNOSIS — F423 Hoarding disorder: Secondary | ICD-10-CM | POA: Diagnosis present

## 2023-06-12 DIAGNOSIS — G3184 Mild cognitive impairment, so stated: Secondary | ICD-10-CM | POA: Diagnosis present

## 2023-06-12 LAB — COMPREHENSIVE METABOLIC PANEL
ALT: 27 U/L (ref 0–44)
AST: 45 U/L — ABNORMAL HIGH (ref 15–41)
Albumin: 2.9 g/dL — ABNORMAL LOW (ref 3.5–5.0)
Alkaline Phosphatase: 95 U/L (ref 38–126)
Anion gap: 14 (ref 5–15)
BUN: 21 mg/dL (ref 8–23)
CO2: 22 mmol/L (ref 22–32)
Calcium: 8.6 mg/dL — ABNORMAL LOW (ref 8.9–10.3)
Chloride: 104 mmol/L (ref 98–111)
Creatinine, Ser: 1.69 mg/dL — ABNORMAL HIGH (ref 0.44–1.00)
GFR, Estimated: 30 mL/min — ABNORMAL LOW (ref >60)
Glucose, Bld: 114 mg/dL — ABNORMAL HIGH (ref 70–99)
Potassium: 4.5 mmol/L (ref 3.5–5.1)
Sodium: 140 mmol/L (ref 135–145)
Total Bilirubin: 0.8 mg/dL (ref 0.0–1.2)
Total Protein: 6.2 g/dL — ABNORMAL LOW (ref 6.5–8.1)

## 2023-06-12 LAB — CBC WITH DIFFERENTIAL/PLATELET
Abs Immature Granulocytes: 0.06 10*3/uL (ref 0.00–0.07)
Basophils Absolute: 0.1 10*3/uL (ref 0.0–0.1)
Basophils Relative: 1 %
Eosinophils Absolute: 0.2 10*3/uL (ref 0.0–0.5)
Eosinophils Relative: 2 %
HCT: 38.1 % (ref 36.0–46.0)
Hemoglobin: 12.2 g/dL (ref 12.0–15.0)
Immature Granulocytes: 1 %
Lymphocytes Relative: 20 %
Lymphs Abs: 2.6 10*3/uL (ref 0.7–4.0)
MCH: 28.4 pg (ref 26.0–34.0)
MCHC: 32 g/dL (ref 30.0–36.0)
MCV: 88.8 fL (ref 80.0–100.0)
Monocytes Absolute: 1 10*3/uL (ref 0.1–1.0)
Monocytes Relative: 7 %
Neutro Abs: 9.2 10*3/uL — ABNORMAL HIGH (ref 1.7–7.7)
Neutrophils Relative %: 69 %
Platelets: 316 10*3/uL (ref 150–400)
RBC: 4.29 MIL/uL (ref 3.87–5.11)
RDW: 15.7 % — ABNORMAL HIGH (ref 11.5–15.5)
WBC: 13.1 10*3/uL — ABNORMAL HIGH (ref 4.0–10.5)
nRBC: 0 % (ref 0.0–0.2)

## 2023-06-12 LAB — I-STAT CG4 LACTIC ACID, ED: Lactic Acid, Venous: 1.5 mmol/L (ref 0.5–1.9)
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
## 2023-06-12 NOTE — ED Provider Triage Note (Signed)
Emergency Medicine Provider Triage Evaluation Note  Joy Tapia , a 83 y.o. female  was evaluated in triage.  Pt complains of osteomyelitis, sent in by Dr. Jamse Arn who is a podiatrist.  Think she will need further amputation and admission..  Review of Systems  Positive: Foot infection on the right Negative: Fever  Physical Exam  BP (!) 165/61   Pulse 80   Temp 98.7 F (37.1 C)   Resp 14   Ht 4\' 11"  (1.499 m)   Wt 54.4 kg   SpO2 100%   BMI 24.24 kg/m  Gen:   Awake, no distress   Resp:  Normal effort  MSK:   Moves extremities without difficulty  Other:    Medical Decision Making  Medically screening exam initiated at 4:53 PM.  Appropriate orders placed.  Joy Tapia was informed that the remainder of the evaluation will be completed by another provider, this initial triage assessment does not replace that evaluation, and the importance of remaining in the ED until their evaluation is complete.     Joy Captain, PA-C 06/12/23 1657

## 2023-06-12 NOTE — ED Triage Notes (Signed)
Pt sent here for further workup of her right foot wound, pt has had partial amputations on that foot in the past but wound will not heal, denies significant pain, post op shoe in place with bandage

## 2023-06-13 ENCOUNTER — Other Ambulatory Visit: Payer: PPO

## 2023-06-13 ENCOUNTER — Inpatient Hospital Stay (HOSPITAL_COMMUNITY): Payer: Medicare Other

## 2023-06-13 DIAGNOSIS — Z87891 Personal history of nicotine dependence: Secondary | ICD-10-CM | POA: Diagnosis not present

## 2023-06-13 DIAGNOSIS — M869 Osteomyelitis, unspecified: Secondary | ICD-10-CM | POA: Diagnosis present

## 2023-06-13 DIAGNOSIS — Z794 Long term (current) use of insulin: Secondary | ICD-10-CM

## 2023-06-13 DIAGNOSIS — E11628 Type 2 diabetes mellitus with other skin complications: Secondary | ICD-10-CM | POA: Diagnosis present

## 2023-06-13 DIAGNOSIS — I10 Essential (primary) hypertension: Secondary | ICD-10-CM | POA: Diagnosis not present

## 2023-06-13 DIAGNOSIS — M86171 Other acute osteomyelitis, right ankle and foot: Secondary | ICD-10-CM

## 2023-06-13 DIAGNOSIS — N184 Chronic kidney disease, stage 4 (severe): Secondary | ICD-10-CM | POA: Diagnosis present

## 2023-06-13 DIAGNOSIS — Z91199 Patient's noncompliance with other medical treatment and regimen due to unspecified reason: Secondary | ICD-10-CM | POA: Diagnosis not present

## 2023-06-13 DIAGNOSIS — E11621 Type 2 diabetes mellitus with foot ulcer: Secondary | ICD-10-CM

## 2023-06-13 DIAGNOSIS — E785 Hyperlipidemia, unspecified: Secondary | ICD-10-CM | POA: Diagnosis present

## 2023-06-13 DIAGNOSIS — L97522 Non-pressure chronic ulcer of other part of left foot with fat layer exposed: Secondary | ICD-10-CM | POA: Diagnosis present

## 2023-06-13 DIAGNOSIS — M810 Age-related osteoporosis without current pathological fracture: Secondary | ICD-10-CM | POA: Diagnosis present

## 2023-06-13 DIAGNOSIS — L97909 Non-pressure chronic ulcer of unspecified part of unspecified lower leg with unspecified severity: Secondary | ICD-10-CM

## 2023-06-13 DIAGNOSIS — M216X1 Other acquired deformities of right foot: Secondary | ICD-10-CM | POA: Diagnosis not present

## 2023-06-13 DIAGNOSIS — E1122 Type 2 diabetes mellitus with diabetic chronic kidney disease: Secondary | ICD-10-CM | POA: Diagnosis present

## 2023-06-13 DIAGNOSIS — G3184 Mild cognitive impairment, so stated: Secondary | ICD-10-CM | POA: Diagnosis present

## 2023-06-13 DIAGNOSIS — Z7984 Long term (current) use of oral hypoglycemic drugs: Secondary | ICD-10-CM | POA: Diagnosis not present

## 2023-06-13 DIAGNOSIS — Z96651 Presence of right artificial knee joint: Secondary | ICD-10-CM | POA: Diagnosis present

## 2023-06-13 DIAGNOSIS — L089 Local infection of the skin and subcutaneous tissue, unspecified: Secondary | ICD-10-CM | POA: Diagnosis not present

## 2023-06-13 DIAGNOSIS — Z89411 Acquired absence of right great toe: Secondary | ICD-10-CM | POA: Diagnosis not present

## 2023-06-13 DIAGNOSIS — M21961 Unspecified acquired deformity of right lower leg: Secondary | ICD-10-CM | POA: Diagnosis present

## 2023-06-13 DIAGNOSIS — L03115 Cellulitis of right lower limb: Secondary | ICD-10-CM | POA: Diagnosis present

## 2023-06-13 DIAGNOSIS — M1A9XX1 Chronic gout, unspecified, with tophus (tophi): Secondary | ICD-10-CM | POA: Diagnosis present

## 2023-06-13 DIAGNOSIS — Z7982 Long term (current) use of aspirin: Secondary | ICD-10-CM | POA: Diagnosis not present

## 2023-06-13 DIAGNOSIS — L97519 Non-pressure chronic ulcer of other part of right foot with unspecified severity: Secondary | ICD-10-CM | POA: Diagnosis present

## 2023-06-13 DIAGNOSIS — F423 Hoarding disorder: Secondary | ICD-10-CM | POA: Diagnosis present

## 2023-06-13 DIAGNOSIS — E1169 Type 2 diabetes mellitus with other specified complication: Secondary | ICD-10-CM | POA: Diagnosis present

## 2023-06-13 DIAGNOSIS — L97509 Non-pressure chronic ulcer of other part of unspecified foot with unspecified severity: Secondary | ICD-10-CM

## 2023-06-13 DIAGNOSIS — M879 Osteonecrosis, unspecified: Secondary | ICD-10-CM | POA: Diagnosis present

## 2023-06-13 DIAGNOSIS — I129 Hypertensive chronic kidney disease with stage 1 through stage 4 chronic kidney disease, or unspecified chronic kidney disease: Secondary | ICD-10-CM | POA: Diagnosis present

## 2023-06-13 LAB — C-REACTIVE PROTEIN: CRP: 1.5 mg/dL — ABNORMAL HIGH (ref <1.0)

## 2023-06-13 LAB — BASIC METABOLIC PANEL
Anion gap: 10 (ref 5–15)
BUN: 20 mg/dL (ref 8–23)
CO2: 24 mmol/L (ref 22–32)
Calcium: 8.1 mg/dL — ABNORMAL LOW (ref 8.9–10.3)
Chloride: 104 mmol/L (ref 98–111)
Creatinine, Ser: 1.56 mg/dL — ABNORMAL HIGH (ref 0.44–1.00)
GFR, Estimated: 33 mL/min — ABNORMAL LOW (ref >60)
Glucose, Bld: 152 mg/dL — ABNORMAL HIGH (ref 70–99)
Potassium: 3.6 mmol/L (ref 3.5–5.1)
Sodium: 138 mmol/L (ref 135–145)

## 2023-06-13 LAB — CBC
HCT: 34.3 % — ABNORMAL LOW (ref 36.0–46.0)
Hemoglobin: 11.2 g/dL — ABNORMAL LOW (ref 12.0–15.0)
MCH: 28.4 pg (ref 26.0–34.0)
MCHC: 32.7 g/dL (ref 30.0–36.0)
MCV: 87.1 fL (ref 80.0–100.0)
Platelets: 270 10*3/uL (ref 150–400)
RBC: 3.94 MIL/uL (ref 3.87–5.11)
RDW: 15.5 % (ref 11.5–15.5)
WBC: 9.5 10*3/uL (ref 4.0–10.5)
nRBC: 0 % (ref 0.0–0.2)

## 2023-06-13 LAB — CBG MONITORING, ED
Glucose-Capillary: 144 mg/dL — ABNORMAL HIGH (ref 70–99)
Glucose-Capillary: 121 mg/dL — ABNORMAL HIGH (ref 70–99)

## 2023-06-13 LAB — HEMOGLOBIN A1C
Hgb A1c MFr Bld: 9.2 % — ABNORMAL HIGH (ref 4.8–5.6)
Mean Plasma Glucose: 217.34 mg/dL

## 2023-06-13 LAB — VAS US ABI WITH/WO TBI
Left ABI: 0.99
Right ABI: 1.03

## 2023-06-13 LAB — SEDIMENTATION RATE: Sed Rate: 38 mm/h — ABNORMAL HIGH (ref 0–22)

## 2023-06-13 LAB — PREALBUMIN: Prealbumin: 19 mg/dL (ref 18–38)

## 2023-06-13 LAB — GLUCOSE, CAPILLARY
Glucose-Capillary: 97 mg/dL (ref 70–99)
Glucose-Capillary: 195 mg/dL — ABNORMAL HIGH (ref 70–99)

## 2023-06-13 MED ORDER — METRONIDAZOLE 500 MG PO TABS
500.0000 mg | ORAL_TABLET | Freq: Two times a day (BID) | ORAL | Status: DC
  Administered 2023-06-13 – 2023-06-19 (×12): 500 mg via ORAL
  Filled 2023-06-13 (×12): qty 1

## 2023-06-13 MED ORDER — PRAVASTATIN SODIUM 40 MG PO TABS
20.0000 mg | ORAL_TABLET | Freq: Every day | ORAL | Status: DC
  Administered 2023-06-13 – 2023-06-14 (×2): 20 mg via ORAL
  Filled 2023-06-13 (×2): qty 1

## 2023-06-13 MED ORDER — INSULIN ASPART 100 UNIT/ML IJ SOLN
0.0000 [IU] | Freq: Three times a day (TID) | INTRAMUSCULAR | Status: DC
Start: 2023-06-13 — End: 2023-06-16
  Administered 2023-06-13 (×2): 1 [IU] via SUBCUTANEOUS
  Administered 2023-06-14: 3 [IU] via SUBCUTANEOUS
  Administered 2023-06-14: 2 [IU] via SUBCUTANEOUS
  Administered 2023-06-14: 1 [IU] via SUBCUTANEOUS
  Administered 2023-06-15 (×2): 2 [IU] via SUBCUTANEOUS
  Administered 2023-06-15: 5 [IU] via SUBCUTANEOUS

## 2023-06-13 MED ORDER — ACETAMINOPHEN 325 MG PO TABS
650.0000 mg | ORAL_TABLET | Freq: Four times a day (QID) | ORAL | Status: DC | PRN
  Administered 2023-06-16 – 2023-06-18 (×2): 650 mg via ORAL
  Filled 2023-06-13 (×2): qty 2

## 2023-06-13 MED ORDER — ONDANSETRON HCL 4 MG PO TABS: 4.0000 mg | ORAL_TABLET | Freq: Four times a day (QID) | ORAL | Status: DC | PRN

## 2023-06-13 MED ORDER — ONDANSETRON HCL 4 MG/2ML IJ SOLN: 4.0000 mg | Freq: Four times a day (QID) | INTRAMUSCULAR | Status: DC | PRN

## 2023-06-13 MED ORDER — MUPIROCIN CALCIUM 2 % EX CREA
TOPICAL_CREAM | Freq: Every day | CUTANEOUS | Status: DC
  Filled 2023-06-13 (×2): qty 15

## 2023-06-13 MED ORDER — ALBUTEROL SULFATE (2.5 MG/3ML) 0.083% IN NEBU: 2.5000 mg | INHALATION_SOLUTION | Freq: Four times a day (QID) | RESPIRATORY_TRACT | Status: DC | PRN

## 2023-06-13 MED ORDER — INSULIN GLARGINE-YFGN 100 UNIT/ML ~~LOC~~ SOLN
10.0000 [IU] | Freq: Every day | SUBCUTANEOUS | Status: DC
  Administered 2023-06-13 – 2023-06-15 (×3): 10 [IU] via SUBCUTANEOUS
  Filled 2023-06-13 (×4): qty 0.1

## 2023-06-13 MED ORDER — ASPIRIN 81 MG PO TBEC
81.0000 mg | DELAYED_RELEASE_TABLET | Freq: Every day | ORAL | Status: DC
Start: 2023-06-13 — End: 2023-06-19
  Administered 2023-06-13 – 2023-06-19 (×6): 81 mg via ORAL
  Filled 2023-06-13 (×6): qty 1

## 2023-06-13 MED ORDER — VANCOMYCIN HCL 1000 MG IV SOLR
750.0000 mg | INTRAVENOUS | Status: DC
  Filled 2023-06-13: qty 15

## 2023-06-13 MED ORDER — SODIUM CHLORIDE 0.9 % IV SOLN
2.0000 g | INTRAVENOUS | Status: DC
  Administered 2023-06-13 – 2023-06-19 (×6): 2 g via INTRAVENOUS
  Filled 2023-06-13 (×7): qty 20

## 2023-06-13 MED ORDER — SODIUM CHLORIDE 0.9 % IV SOLN
2.0000 g | Freq: Once | INTRAVENOUS | Status: AC
  Administered 2023-06-13: 2 g via INTRAVENOUS
  Filled 2023-06-13: qty 12.5

## 2023-06-13 MED ORDER — PANTOPRAZOLE SODIUM 40 MG PO TBEC
40.0000 mg | DELAYED_RELEASE_TABLET | Freq: Every day | ORAL | Status: DC
Start: 2023-06-13 — End: 2023-06-19
  Administered 2023-06-13 – 2023-06-19 (×6): 40 mg via ORAL
  Filled 2023-06-13 (×6): qty 1

## 2023-06-13 MED ORDER — ENOXAPARIN SODIUM 30 MG/0.3ML IJ SOSY
30.0000 mg | PREFILLED_SYRINGE | INTRAMUSCULAR | Status: DC
  Administered 2023-06-13 – 2023-06-18 (×6): 30 mg via SUBCUTANEOUS
  Filled 2023-06-13 (×6): qty 0.3

## 2023-06-13 MED ORDER — METOPROLOL SUCCINATE ER 50 MG PO TB24
50.0000 mg | ORAL_TABLET | Freq: Every day | ORAL | Status: DC
Start: 2023-06-13 — End: 2023-06-19
  Administered 2023-06-13 – 2023-06-19 (×6): 50 mg via ORAL
  Filled 2023-06-13 (×4): qty 1
  Filled 2023-06-13: qty 2
  Filled 2023-06-13: qty 1

## 2023-06-13 MED ORDER — FUROSEMIDE 40 MG PO TABS
40.0000 mg | ORAL_TABLET | Freq: Every day | ORAL | Status: DC
Start: 2023-06-13 — End: 2023-06-19
  Administered 2023-06-13 – 2023-06-19 (×6): 40 mg via ORAL
  Filled 2023-06-13 (×2): qty 1
  Filled 2023-06-13: qty 2
  Filled 2023-06-13 (×3): qty 1

## 2023-06-13 MED ORDER — FEBUXOSTAT 40 MG PO TABS
80.0000 mg | ORAL_TABLET | Freq: Every day | ORAL | Status: DC
  Administered 2023-06-13 – 2023-06-15 (×3): 80 mg via ORAL
  Filled 2023-06-13 (×3): qty 2

## 2023-06-13 MED ORDER — VANCOMYCIN HCL IN DEXTROSE 1-5 GM/200ML-% IV SOLN
1000.0000 mg | Freq: Once | INTRAVENOUS | Status: AC
  Administered 2023-06-13: 1000 mg via INTRAVENOUS
  Filled 2023-06-13: qty 200

## 2023-06-13 MED ORDER — ACETAMINOPHEN 650 MG RE SUPP: 650.0000 mg | Freq: Four times a day (QID) | RECTAL | Status: DC | PRN

## 2023-06-13 NOTE — Progress Notes (Signed)
PROGRESS NOTE    Joy Tapia  YQI:[id number] DOB: 08/15/1940 DOA: 06/12/2023 PCP: System, Provider Not In   Brief Narrative: Joy Tapia is a 83 y.o. female with a history of diabetes mellitus type 2, hypertension, CKD stage IV, hyperlipidemia, mild cognitive impairment.  Patient presented secondary to a progressively worsening wound infection with evidence of osteomyelitis requiring amputation.  Empiric antibiotics started.  Podiatry consulted for management.   Assessment/Plan:  Diabetic right foot infection Osteomyelitis of right foot Worsening right foot ulcer with evidence of osteomyelitis on x-ray, confirmed on MRI affecting the second metatarsal and third proximal phalanx. Associated bone necrosis. Podiatry consulted with plan for amputation. ABIs ordered and are pending. Empiric antibiotics started. -Continue Vancomycin, Ceftriaxone and Flagyl -Follow-up ABIs -Follow-up podiatry recommendations  Primary hypertension -Continue Toprol XL  CKD stage IV Stable.  Diabetes mellitus type 2 Seems to be well controlled using last hemoglobin A1C of 7.4% from 06/2022. Patient is on Levemir and glimepiride as an outpatient. Patient started on Semglee and SSI on admission. -Continue Semglee and SSI -Will likely discontinue glimepiride on discharge  Mild cognitive impairment Noted. Patient states that she has a healthcare power of attorney, but that she is not her daughter, as is documented in chart, but rather a friend.  Hyperlipidemia -Continue pravastatin   DVT prophylaxis: Lovenox Code Status:   Code Status: Full Code Family Communication: None at bedside Disposition Plan: Discharge pending podiatry recommendations/management and eventual PT/OT recommendations   Consultants:  Podiatry  Procedures:  None  Antimicrobials: Vancomycin Ceftriaxone Flagyl    Subjective: Patient reports no concerns this morning.  Objective: BP (!) 152/69   Pulse 63    Temp 98.4 F (36.9 C) (Oral)   Resp 20   Ht 4\' 11"  (1.499 m)   Wt 54.4 kg   SpO2 100%   BMI 24.24 kg/m   Examination:  General exam: Appears calm and comfortable Respiratory system: Clear to auscultation. Respiratory effort normal. Cardiovascular system: S1 & S2 heard, RRR. No murmurs. Gastrointestinal system: Abdomen is nondistended, soft and nontender. Normal bowel sounds heard. Central nervous system: Alert and oriented. No focal neurological deficits. Psychiatry: Judgement and insight appear normal. Mood & affect appropriate.    Data Reviewed: I have personally reviewed following labs and imaging studies   Last CBC Lab Results  Component Value Date   WBC 9.5 06/13/2023   HGB 11.2 (L) 06/13/2023   HCT 34.3 (L) 06/13/2023   MCV 87.1 06/13/2023   MCH 28.4 06/13/2023   RDW 15.5 06/13/2023   PLT 270 06/13/2023     Last metabolic panel Lab Results  Component Value Date   GLUCOSE 152 (H) 06/13/2023   NA 138 06/13/2023   K 3.6 06/13/2023   CL 104 06/13/2023   CO2 24 06/13/2023   BUN 20 06/13/2023   CREATININE 1.56 (H) 06/13/2023   GFRNONAA 33 (L) 06/13/2023   CALCIUM 8.1 (L) 06/13/2023   PROT 6.2 (L) 06/12/2023   ALBUMIN 2.9 (L) 06/12/2023   BILITOT 0.8 06/12/2023   ALKPHOS 95 06/12/2023   AST 45 (H) 06/12/2023   ALT 27 06/12/2023   ANIONGAP 10 06/13/2023     Creatinine Clearance: Estimated Creatinine Clearance: 21.3 mL/min (A) (by C-G formula based on SCr of 1.56 mg/dL (H)).  Recent Results (from the past 240 hours)  Blood Cultures x 2 sites     Status: None (Preliminary result)   Collection Time: 06/12/23  4:56 PM   Specimen: BLOOD  Result Value Ref Range  Status   Specimen Description BLOOD SITE NOT SPECIFIED  Final   Special Requests   Final    BOTTLES DRAWN AEROBIC ONLY Blood Culture results may not be optimal due to an inadequate volume of blood received in culture bottles   Culture   Final    NO GROWTH < 12 HOURS Performed at [facility]  Lab, [address], [place], [place] [address]    Report Status PENDING  Incomplete      Radiology Studies: MRI Right foot without contrast Result Date: 06/13/2023 CLINICAL DATA:  83 year old female with history of diabetes with chronic wound on the plantar surface of the second MTP joint. EXAM: MRI OF THE RIGHT FOREFOOT WITHOUT CONTRAST TECHNIQUE: Multiplanar, multisequence MR imaging of the right foot was performed. No intravenous contrast was administered. COMPARISON:  Right foot radiographs dated June 12, 2023. FINDINGS: Bones/Joint/Cartilage The second metatarsal demonstrates T2 hyperintense signal abnormality with corresponding confluent T1 hypointensity, compatible with osteomyelitis. There is dorsal dislocation of the second proximal phalanx relative to the second metatarsal at the level of second MTP joint. T2 hyperintensity of the second proximal phalanx without definite corresponding T1 hypointense signal abnormality, suggestive of reactive marrow edema. The third proximal phalanx demonstrates T2 hyperintense signal with corresponding confluent T1 hypointensity, compatible with osteomyelitis. No convincing evidence to suggest osteomyelitis involving the remainder of the visualized osseous structures. Status post amputation of the first ray at the level of the mid first metatarsal. The transsection margins appear intact without appreciable signal abnormality. Diffuse interphalangeal joint space narrowing. TMT joints are anatomically aligned with joint space narrowing and subchondral cystic change at the third and fourth TMT joints. Hammertoe deformities of the third through fifth digits. Ligaments Lisfranc ligament is intact. Muscles and Tendons Partially visualized tenosynovitis of the flexor hallucis longus and flexor digitorum tendons at the level of the knot of Henry. The second flexor digitorum tendon courses around the medial aspect of second metatarsal head at the level of the dislocated  second MTP joint joint. Moderate atrophy of the intrinsic musculature of the foot with diffuse edema. Soft tissue There is a soft tissue wound at the plantar aspect of the forefoot, overlying the level of the second metatarsal head which appears to track deep into the dorsal soft tissues between the second and third MTP joints into a T2 hyperintense fluid collection containing air. This collection measures approximately 1.7 x 1.7 by 1.6 cm (series 6, image 14 and series 8, image 14). There is diffuse subcutaneous edema throughout the forefoot, most pronounced dorsally. IMPRESSION: 1. Findings compatible with osteomyelitis of the second metatarsal and the third proximal phalanx. There is dorsal dislocation at the level of second MTP joint. 2. T2 hyperintensity of the second proximal phalanx without appreciable corresponding T1 hypointense signal abnormality, suggestive of reactive marrow edema. 3. Soft tissue wound at the plantar aspect of the forefoot overlying the level of the second metatarsal head which appears to track deep into the dorsal soft tissues between the second and third MTP joints into a fluid collection containing air. The sterility of the collection remains indeterminate, however, these findings are concerning for gas-forming soft tissue infection. 4. Diffuse edema throughout the forefoot, concerning for cellulitis. Moderate atrophy of the intrinsic musculature of the foot with diffuse edema, which could represent myositis. 5. Status post amputation of the first ray at the level of the mid first metatarsal. The transsection margins appear intact. Electronically Signed   By: Hart Robinsons M.D.   On: 06/13/2023  08:49   DG Foot Complete Right Result Date: 06/12/2023 Please see detailed radiograph report in office note.     LOS: 0 days    Jacquelin Hawking, MD [facility] 06/13/2023, 1:09 PM   If 7PM-7AM, please contact night-coverage [email or web address]

## 2023-06-13 NOTE — Progress Notes (Addendum)
Pharmacy Antibiotic Note  POTA HANIS is a 83 y.o. female admitted on 06/12/2023 with bilateral foot ulceration and concerns for foot infection.  Pharmacy has been consulted for vancomycin dosing.  -WBC 13.1, sCr 1.69(~baseline), lactate 1.5, afebrile -Cefepime/vancomycin given in ED -1 set of blood cultures collected  Plan: -Ceftriaxone 2g IV every 24 hours per MD -Flagyl 500mg  IV every 12 hours -Vancomycin 1000 mg IV x1 -Vancomycin 750mg  IV every 48 hours (AUC 499, Vd 0.72, IBW) -Monitor renal function -Follow up signs of clinical improvement, LOT, de-escalation of antibiotics  -Follow up MRI for osteomyelitis  Height: 4\' 11"  (149.9 cm) Weight: 54.4 kg (120 lb) IBW/kg (Calculated) : 43.2  Temp (24hrs), Avg:98.5 F (36.9 C), Min:98.2 F (36.8 C), Max:98.7 F (37.1 C)  Recent Labs  Lab 06/12/23 1754 06/12/23 1756  WBC  --  13.1*  CREATININE  --  1.69*  LATICACIDVEN 1.5  --     Estimated Creatinine Clearance: 19.7 mL/min (A) (by C-G formula based on SCr of 1.69 mg/dL (H)).    Antimicrobials this admission: Ceftriaxone 1/17 >>  Flagyl 1/17 >>  Vancomycin 1/17 >>  Microbiology results: 1/17 BCx:   Thank you for allowing pharmacy to be a part of this patient's care.  Arabella Merles, PharmD. Clinical Pharmacist 06/13/2023 3:49 AM
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
## 2023-06-13 NOTE — Assessment & Plan Note (Signed)
Pt definitely seems to maybe have some early dementia, doesn't seem to remember discussion at [facility] office earlier today with daughter or that daughter was even here earlier in evening.

## 2023-06-13 NOTE — Consult Note (Addendum)
WOC Nurse Consult Note: Reason for Consult: Consult requested for bilat foot wounds.  Performed remotely after review of progress notes and photos in the EMR.  Pt has chronic full thickness wounds to bilat plantar feet and has been followed by the podiatry team prior to admission and they ordered Bactroban. A consult is pending from their team. X-rays and MRI results are also pending to r/o osteomyelitis and progress notes indicate the podiatry team plans to follow after the patient is admitted.   Topical treatment orders provided for bedside nurses to perform as was previously ordered: Apply Bactroban to bilat foot wounds Q day, then cover with foam dresisngs.  Change foam dressings Q 3 days or PRN soiling. Please refer to the podiatry team for further plan of care. Please re-consult if further assistance is needed.  Thank-you,  Cammie Mcgee MSN, RN, CWOCN, [place], CNS [phone]

## 2023-06-13 NOTE — Plan of Care (Signed)

## 2023-06-13 NOTE — Assessment & Plan Note (Signed)
Hold home Amaryl Glargine 10u daily while here (takes 12u daily long acting at home) Sensitive SSI [facility]

## 2023-06-13 NOTE — ED Provider Notes (Signed)
[place] EMERGENCY DEPARTMENT AT [facility] Provider Note   CSN: [id number] Arrival date & time: 06/12/23  1528     History  Chief Complaint  Patient presents with   Wound Infection    Joy Tapia is a 83 y.o. female.  H/o diabetes and osteomyelitis s/p toe amputation being followed by podiatry for chronic wound on plantar surface of 2nd MTP joint. Sent here today after being on doxy for over a week without any improvement and reportedly increased drainage. No fevers or systemic symptoms, left foot healing well. Recommenced coming here for mRI, admit, antibiotics and they will likely plan for surgery.         Home Medications Prior to Admission medications   Medication Sig Start Date End Date Taking? Authorizing Provider  acetaminophen (TYLENOL) 325 MG tablet Take 650 mg by mouth every 8 (eight) hours as needed.   Yes [provider]  albuterol (VENTOLIN HFA) 108 (90 Base) MCG/ACT inhaler Inhale into the lungs. 05/07/23  Yes [provider]  aspirin EC 81 MG tablet Take 81 mg by mouth daily.   Yes [provider]  cetirizine (ZYRTEC) 10 MG tablet Take 10 mg by mouth daily.   Yes [provider]  doxycycline (VIBRA-TABS) 100 MG tablet Take 1 tablet (100 mg total) by mouth 2 (two) times daily. 06/05/23  Yes Semon, Jake L, DPM  Febuxostat 80 MG TABS Take 1 tablet by mouth daily.   Yes [provider]  fluticasone (FLONASE) 50 MCG/ACT nasal spray Place 1 spray into both nostrils daily as needed for allergies or rhinitis.   Yes [provider]  furosemide (LASIX) 40 MG tablet Take 1 tablet (40 mg total) by mouth daily. 04/29/23  Yes Regalado, Belkys A, MD  glimepiride (AMARYL) 4 MG tablet Take 4 mg by mouth daily with breakfast.   Yes [provider]  insulin detemir (LEVEMIR) 100 UNIT/ML injection Inject 0.12 mLs (12 Units total) into the skin daily. 04/29/23  Yes Regalado, Belkys A, MD  metoprolol  succinate (TOPROL-XL) 50 MG 24 hr tablet Take 50 mg by mouth daily.   Yes [provider]  Multiple Vitamin (MULTIVITAMIN) capsule Take 1 capsule by mouth daily.   Yes [provider]  mupirocin ointment (BACTROBAN) 2 % Apply 1 Application topically 2 (two) times daily. 06/05/23  Yes Semon, Jake L, DPM  omeprazole (PRILOSEC) 20 MG capsule Take 20 mg by mouth daily.   Yes [provider]  pravastatin (PRAVACHOL) 20 MG tablet Take 20 mg by mouth at bedtime.   Yes [provider]  Insulin Syringe-Needle U-100 (INSULIN SYRINGE .5CC/28G) 28G X 1/2" 0.5 ML MISC 1 Application by Does not apply route daily. 04/29/23   Regalado, Belkys A, MD      Allergies    Atorvastatin, Azithromycin, Erythromycin, Other, and Propoxyphene    Review of Systems   Review of Systems  Physical Exam Updated Vital Signs BP 136/60   Pulse 69   Temp 98 F (36.7 C)   Resp 17   Ht 4\' 11"  (1.499 m)   Wt 54.4 kg   SpO2 100%   BMI 24.24 kg/m  Physical Exam Vitals and nursing note reviewed.  Constitutional:      Appearance: She is well-developed.  HENT:     Head: Normocephalic and atraumatic.  Cardiovascular:     Rate and Rhythm: Normal rate and regular rhythm.  Pulmonary:     Effort: No respiratory distress.  Breath sounds: No stridor.  Abdominal:     General: There is no distension.  Musculoskeletal:     Cervical back: Normal range of motion.  Skin:    General: Skin is warm and dry.     Comments: Erythema on dorsum of right foot up to ankle and around plantar surface as well. Insensate in the area. Small wound on plantar surface of 2nd MTP joint without significant malodor. No drainage at this time.   Neurological:     Mental Status: She is alert.     ED Results / Procedures / Treatments   Labs (all labs ordered are listed, but only abnormal results are displayed) Labs Reviewed  COMPREHENSIVE METABOLIC PANEL - Abnormal; Notable for the following components:       Result Value   Glucose, Bld 114 (*)    Creatinine, Ser 1.69 (*)    Calcium 8.6 (*)    Total Protein 6.2 (*)    Albumin 2.9 (*)    AST 45 (*)    GFR, Estimated 30 (*)    All other components within normal limits  CBC WITH DIFFERENTIAL/PLATELET - Abnormal; Notable for the following components:   WBC 13.1 (*)    RDW 15.7 (*)    Neutro Abs 9.2 (*)    All other components within normal limits  C-REACTIVE PROTEIN - Abnormal; Notable for the following components:   CRP 1.5 (*)    All other components within normal limits  CBC - Abnormal; Notable for the following components:   Hemoglobin 11.2 (*)    HCT 34.3 (*)    All other components within normal limits  BASIC METABOLIC PANEL - Abnormal; Notable for the following components:   Glucose, Bld 152 (*)    Creatinine, Ser 1.56 (*)    Calcium 8.1 (*)    GFR, Estimated 33 (*)    All other components within normal limits  CULTURE, BLOOD (ROUTINE X 2)  CULTURE, BLOOD (ROUTINE X 2)  PREALBUMIN  HEMOGLOBIN A1C  SEDIMENTATION RATE  I-STAT CG4 LACTIC ACID, ED    EKG None  Radiology DG Foot Complete Right Result Date: 06/12/2023 Please see detailed radiograph report in office note.   Procedures Procedures    Medications Ordered in ED Medications  cefTRIAXone (ROCEPHIN) 2 g in sodium chloride 0.9 % 100 mL IVPB (has no administration in time range)  metroNIDAZOLE (FLAGYL) tablet 500 mg (has no administration in time range)  enoxaparin (LOVENOX) injection 30 mg (has no administration in time range)  acetaminophen (TYLENOL) tablet 650 mg (has no administration in time range)    Or  acetaminophen (TYLENOL) suppository 650 mg (has no administration in time range)  ondansetron (ZOFRAN) tablet 4 mg (has no administration in time range)    Or  ondansetron (ZOFRAN) injection 4 mg (has no administration in time range)  aspirin EC tablet 81 mg (has no administration in time range)  albuterol (PROVENTIL) (2.5 MG/3ML) 0.083% nebulizer  solution 2.5 mg (has no administration in time range)  febuxostat (ULORIC) tablet 80 mg (has no administration in time range)  furosemide (LASIX) tablet 40 mg (has no administration in time range)  insulin glargine-yfgn (SEMGLEE) injection 10 Units (has no administration in time range)  insulin aspart (novoLOG) injection 0-9 Units (has no administration in time range)  pravastatin (PRAVACHOL) tablet 20 mg (has no administration in time range)  pantoprazole (PROTONIX) EC tablet 40 mg (has no administration in time range)  metoprolol succinate (TOPROL-XL) 24 hr tablet 50 mg (has  no administration in time range)  vancomycin (VANCOCIN) 750 mg in sodium chloride 0.9 % 250 mL IVPB (has no administration in time range)  vancomycin (VANCOCIN) IVPB 1000 mg/200 mL premix (0 mg Intravenous Stopped 06/13/23 0311)  ceFEPIme (MAXIPIME) 2 g in sodium chloride 0.9 % 100 mL IVPB (0 g Intravenous Stopped 06/13/23 0234)    ED Course/ Medical Decision Making/ A&P                                 Medical Decision Making Risk Prescription drug management. Decision regarding hospitalization.   Discussed with Podiatry. No plan for surgery Friday, but will see in AM, review labs/MRI and make a plan, requests med admit. D/W Dr. Julian Reil, will admit. Abx started here after cultures.   Final Clinical Impression(s) / ED Diagnoses Final diagnoses:  Cellulitis of right lower extremity  Osteomyelitis of right foot, unspecified type [facility])    Rx / DC Orders ED Discharge Orders     None         Akasha Melena, Barbara Cower, MD 06/13/23 [phone]

## 2023-06-13 NOTE — Consult Note (Signed)
PODIATRY CONSULTATION  NAME ANISHIA BEAULIEU MRN [id number] DOB [birth year] DOA 06/12/2023   Reason for consult:  Chief Complaint  Patient presents with   Wound Infection    Attending/Consulting physician:   History of present illness: "MARLYSS CALDERONE is a 83 y.o. female with a history of diabetes mellitus type 2, hypertension, CKD stage IV, hyperlipidemia, mild cognitive impairment.  Patient presented secondary to a progressively worsening wound infection with evidence of osteomyelitis requiring amputation.  Empiric antibiotics started.  Podiatry consulted for management."   Patient seen by Dr. Jamse Arn 2 days ago.  Was noted to have concern for osteomyelitis on the right foot.  He recommended admission for IV antibiotics MRI.  MRI concerning for osteomyelitis.  Patient denies pain in her right foot she does have some sensation.  She says she does not feel sick at this time.  She also has a wound on the left foot that appears to be relatively stable and is dressed at this time.  Wearing a postop shoe    Past Medical History:  Diagnosis Date   Deep vein thrombosis (DVT) (HCC)    Diabetes mellitus without complication (HCC)    Foot fracture, right    GERD (gastroesophageal reflux disease)    Gout    Hoarding disorder    Hyperlipidemia    Hypertension    Hypertension in stage 4 chronic kidney disease due to type 2 diabetes mellitus (HCC)    Migraines    Mild cognitive impairment    Non-compliance    Osteoarthritis    Osteoporosis, post-menopausal    Pneumonia        Latest Ref Rng & Units 06/13/2023    6:03 AM 06/12/2023    5:56 PM 04/28/2023    8:07 AM  CBC  WBC 4.0 - 10.5 K/uL 9.5  13.1  10.1   Hemoglobin 12.0 - 15.0 g/dL 78.2  95.6  21.3   Hematocrit 36.0 - 46.0 % 34.3  38.1  32.3   Platelets 150 - 400 K/uL 270  316  233        Latest Ref Rng & Units 06/13/2023    6:03 AM 06/12/2023    5:56 PM 04/29/2023    3:58 AM  BMP  Glucose 70 - 99 mg/dL 086  578    BUN  8 - 23 mg/dL 20  21    Creatinine 4.69 - 1.00 mg/dL 6.29  5.28  4.13   Sodium 135 - 145 mmol/L 138  140    Potassium 3.5 - 5.1 mmol/L 3.6  4.5    Chloride 98 - 111 mmol/L 104  104    CO2 22 - 32 mmol/L 24  22    Calcium 8.9 - 10.3 mg/dL 8.1  8.6        Physical Exam: Lower Extremity Exam Vasc: R - PT palpable, DP palpable. Cap refill < 3 sec to digits  L - PT palpable, DP palpable. Cap refill <3 sec to digits  Derm: R -ulceration present on the right plantar forefoot underlying the second metatarsal head.  Mild malodor and drainage from this area.  Erythema and edema of the forefoot.  L -ulceration present plantar forefoot with fibrotic tissues and malodor but no active drainage drainage or deep probing wound.  MSK:  R -prior partial first ray amputation  L -  No gross deformities. Compartments soft, non-tender, compressible  Neuro: R - Gross sensation diminished. Gross motor function intact   L - Gross sensation diminished.  Gross motor function intact    ASSESSMENT/PLAN OF CARE 83 y.o. female with PMHx significant for  diabetes mellitus type 2, hypertension, CKD stage IV, hyperlipidemia, mild cognitive impairment  with osteomyelitis of the right second metatarsal head as well as third proximal phalanx.  Also with chronic ulceration plantar aspect of the left foot without evidence of osteomyelitis at this time  MRI right foot: Osteomyelitis of the second metatarsal and third proximal phalanx.  Deep tracking soft tissue ulceration with air between the second and third metatarsals.  Diffuse edema of the forefoot.  - NPO past MN Sunday for OR Monday AM for R foot transmetatarsal amputation, possible TAL, Left foot ulcer debridement and possible graft application -Will follow-up on ABI results however given palpable pedal pulses on the bilateral foot likely no vascular intervention will assess bleeding intraoperatively. - Continue IV abx broad spectrum pending further culture data -  Anticoagulation: Hold prior to OR on Monday - Wound care: Leave dressing to left foot intact until the OR right foot can have Betadine gauze dressing - WB status: Weightbearing as tolerated for now - Will continue to follow   Thank you for the consult.  Please contact me directly with any questions or concerns.           Corinna Gab, DPM [facility]    [address] [place], [place] [address]                Office [phone]  Fax [phone]

## 2023-06-13 NOTE — Assessment & Plan Note (Signed)
Con;t metoprolol 

## 2023-06-13 NOTE — Assessment & Plan Note (Addendum)
Creat 1.6 today appears c/w prior baseline. Cont daily lasix.

## 2023-06-13 NOTE — Assessment & Plan Note (Signed)
Cont statin

## 2023-06-13 NOTE — Assessment & Plan Note (Addendum)
Worsening ulcer with osteomyelitis on plain film x rays. MRI pending LE wound pathway TFA to see in hospital, sounds like surgery maybe Sat Rocephin, flagyl, vanc MRSA risk factor = wound care in past 30 days ABI MRI of foot done in ED read is pending.

## 2023-06-14 DIAGNOSIS — L089 Local infection of the skin and subcutaneous tissue, unspecified: Secondary | ICD-10-CM | POA: Diagnosis not present

## 2023-06-14 DIAGNOSIS — E11628 Type 2 diabetes mellitus with other skin complications: Secondary | ICD-10-CM

## 2023-06-14 LAB — GLUCOSE, CAPILLARY
Glucose-Capillary: 141 mg/dL — ABNORMAL HIGH (ref 70–99)
Glucose-Capillary: 176 mg/dL — ABNORMAL HIGH (ref 70–99)
Glucose-Capillary: 197 mg/dL — ABNORMAL HIGH (ref 70–99)
Glucose-Capillary: 208 mg/dL — ABNORMAL HIGH (ref 70–99)

## 2023-06-14 MED ORDER — SODIUM CHLORIDE 0.9 % IV SOLN
750.0000 mg | INTRAVENOUS | Status: DC
  Administered 2023-06-14 – 2023-06-18 (×3): 750 mg via INTRAVENOUS
  Filled 2023-06-14 (×4): qty 15

## 2023-06-14 NOTE — Plan of Care (Signed)

## 2023-06-14 NOTE — Progress Notes (Signed)
PROGRESS NOTE    Joy Tapia  KGU:[id number] DOB: 1940/12/04 DOA: 06/12/2023 PCP: System, Provider Not In   Brief Narrative: Joy Tapia is a 83 y.o. female with a history of diabetes mellitus type 2, hypertension, CKD stage IV, hyperlipidemia, mild cognitive impairment.  Patient presented secondary to a progressively worsening wound infection with evidence of osteomyelitis requiring amputation.  Empiric antibiotics started.  Podiatry consulted for management.   Assessment/Plan:  Diabetic right foot infection Osteomyelitis of right foot Worsening right foot ulcer with evidence of osteomyelitis on x-ray, confirmed on MRI affecting the second metatarsal and third proximal phalanx. Associated bone necrosis. Podiatry consulted with plan for amputation. ABIs ordered and are significant for normal right side and abnormal left TBI. Empiric antibiotics started. -Continue Vancomycin, Ceftriaxone and Flagyl -Follow-up podiatry recommendations: plan for TMA  Primary hypertension -Continue Toprol XL  CKD stage IV Stable.  Diabetes mellitus type 2 Seems to be well controlled using last hemoglobin A1C of 7.4% from 06/2022. Patient is on Levemir and glimepiride as an outpatient. Patient started on Semglee and SSI on admission. -Continue Semglee and SSI -Will likely discontinue glimepiride on discharge  Mild cognitive impairment Noted. Patient states that she has a healthcare power of attorney, but that she is not her daughter, as is documented in chart, but rather a friend.  Hyperlipidemia -Continue pravastatin   DVT prophylaxis: Lovenox Code Status:   Code Status: Full Code Family Communication: None at bedside Disposition Plan: Discharge pending podiatry recommendations/management and eventual PT/OT recommendations   Consultants:  Podiatry  Procedures:  None  Antimicrobials: Vancomycin Ceftriaxone Flagyl    Subjective: No issues this  morning.  Objective: BP (!) 153/49 (BP Location: Right Arm)   Pulse 70   Temp 97.7 F (36.5 C) (Oral)   Resp 18   Ht 4\' 11"  (1.499 m)   Wt 54.4 kg   SpO2 100%   BMI 24.24 kg/m   Examination:  General exam: Appears calm and comfortable Respiratory system: Clear to auscultation. Respiratory effort normal. Cardiovascular system: S1 & S2 heard, RRR. No murmurs. Gastrointestinal system: Abdomen is nondistended, soft and nontender. Normal bowel sounds heard. Central nervous system: Alert and oriented.  Musculoskeletal: 1+ pitting edema. No calf tenderness Skin: No cyanosis. No rashes Psychiatry: Judgement and insight appear normal. Mood & affect appropriate.    Data Reviewed: I have personally reviewed following labs and imaging studies   Last CBC Lab Results  Component Value Date   WBC 9.5 06/13/2023   HGB 11.2 (L) 06/13/2023   HCT 34.3 (L) 06/13/2023   MCV 87.1 06/13/2023   MCH 28.4 06/13/2023   RDW 15.5 06/13/2023   PLT 270 06/13/2023     Last metabolic panel Lab Results  Component Value Date   GLUCOSE 152 (H) 06/13/2023   NA 138 06/13/2023   K 3.6 06/13/2023   CL 104 06/13/2023   CO2 24 06/13/2023   BUN 20 06/13/2023   CREATININE 1.56 (H) 06/13/2023   GFRNONAA 33 (L) 06/13/2023   CALCIUM 8.1 (L) 06/13/2023   PROT 6.2 (L) 06/12/2023   ALBUMIN 2.9 (L) 06/12/2023   BILITOT 0.8 06/12/2023   ALKPHOS 95 06/12/2023   AST 45 (H) 06/12/2023   ALT 27 06/12/2023   ANIONGAP 10 06/13/2023     Creatinine Clearance: Estimated Creatinine Clearance: 21.3 mL/min (A) (by C-G formula based on SCr of 1.56 mg/dL (H)).  Recent Results (from the past 240 hours)  WOUND CULTURE     Status: None (Preliminary result)  Collection Time: 06/12/23 12:00 PM   Specimen: Foot, Right; Wound  Result Value Ref Range Status   MICRO NUMBER: [id number]  Preliminary   SPECIMEN QUALITY: Adequate  Preliminary   SOURCE: NOT GIVEN  Preliminary   STATUS: PRELIMINARY  Preliminary   GRAM STAIN:    Preliminary    No white blood cells seen No epithelial cells seen Few Gram negative bacilli Few Gram positive cocci in pairs  Blood Cultures x 2 sites     Status: None (Preliminary result)   Collection Time: 06/12/23  4:56 PM   Specimen: BLOOD  Result Value Ref Range Status   Specimen Description BLOOD SITE NOT SPECIFIED  Final   Special Requests   Final    BOTTLES DRAWN AEROBIC ONLY Blood Culture results may not be optimal due to an inadequate volume of blood received in culture bottles   Culture   Final    NO GROWTH < 12 HOURS Performed at [facility], [address] [place], [place] [address]    Report Status PENDING  Incomplete      Radiology Studies: VAS [place] ABI WITH/WO TBI Result Date: 06/13/2023  LOWER EXTREMITY DOPPLER STUDY Patient Name:  Joy Tapia  Date of Exam:   06/13/2023 Medical Rec #: [id number]            Accession #:    [id number] Date of Birth: 03-03-1941            Patient Gender: F Patient Age:   37 years Exam Location:  [facility] Procedure:      VAS [place] ABI WITH/WO TBI Referring Phys: Lyda Perone --------------------------------------------------------------------------------  Indications: Ulceration. Right great toe amputation. High Risk         Hypertension, hyperlipidemia, Diabetes, past history of Factors:          smoking.  Limitations: Today's exam was limited due to involuntary patient movement,              bandages and Left foot in cast. Comparison Study: No prior exam. Performing Technologist: Fernande Bras  Examination Guidelines: A complete evaluation includes at minimum, Doppler waveform signals and systolic blood pressure reading at the level of bilateral brachial, anterior tibial, and posterior tibial arteries, when vessel segments are accessible. Bilateral testing is considered an integral part of a complete examination. Photoelectric Plethysmograph (PPG) waveforms and toe systolic pressure readings are included as required and  additional duplex testing as needed. Limited examinations for reoccurring indications may be performed as noted.  ABI Findings: +---------+------------------+-----+----------+----------+ Right    Rt Pressure (mmHg)IndexWaveform  Comment    +---------+------------------+-----+----------+----------+ Brachial 163                    triphasic            +---------+------------------+-----+----------+----------+ PTA      132               0.76 monophasic           +---------+------------------+-----+----------+----------+ DP       180               1.03 monophasic           +---------+------------------+-----+----------+----------+ Great Toe145               0.83 Normal    2nd digit. +---------+------------------+-----+----------+----------+ +---------+------------------+-----+----------+-------+ Left     Lt Pressure (mmHg)IndexWaveform  Comment +---------+------------------+-----+----------+-------+ Brachial 174  triphasic         +---------+------------------+-----+----------+-------+ PTA      170               0.98 monophasic        +---------+------------------+-----+----------+-------+ DP       172               0.99 monophasic        +---------+------------------+-----+----------+-------+ [facility]               1.34 Abnormal          +---------+------------------+-----+----------+-------+ +-------+-----------+-----------+------------+------------+ ABI/TBIToday's ABIToday's TBIPrevious ABIPrevious TBI +-------+-----------+-----------+------------+------------+ Right  1.03       .83                                 +-------+-----------+-----------+------------+------------+ Left   .99        1.34                                +-------+-----------+-----------+------------+------------+   Summary: Right: Resting right ankle-brachial index is within normal range. The right toe-brachial index is normal. Left: Resting left  ankle-brachial index is within normal range. The left toe-brachial index is abnormal. *See table(s) above for measurements and observations.  Electronically signed by Sherald Hess MD on 06/13/2023 at 5:06:01 PM.    Final    MRI Right foot without contrast Result Date: 06/13/2023 CLINICAL DATA:  83 year old female with history of diabetes with chronic wound on the plantar surface of the second MTP joint. EXAM: MRI OF THE RIGHT FOREFOOT WITHOUT CONTRAST TECHNIQUE: Multiplanar, multisequence MR imaging of the right foot was performed. No intravenous contrast was administered. COMPARISON:  Right foot radiographs dated June 12, 2023. FINDINGS: Bones/Joint/Cartilage The second metatarsal demonstrates T2 hyperintense signal abnormality with corresponding confluent T1 hypointensity, compatible with osteomyelitis. There is dorsal dislocation of the second proximal phalanx relative to the second metatarsal at the level of second MTP joint. T2 hyperintensity of the second proximal phalanx without definite corresponding T1 hypointense signal abnormality, suggestive of reactive marrow edema. The third proximal phalanx demonstrates T2 hyperintense signal with corresponding confluent T1 hypointensity, compatible with osteomyelitis. No convincing evidence to suggest osteomyelitis involving the remainder of the visualized osseous structures. Status post amputation of the first ray at the level of the mid first metatarsal. The transsection margins appear intact without appreciable signal abnormality. Diffuse interphalangeal joint space narrowing. TMT joints are anatomically aligned with joint space narrowing and subchondral cystic change at the third and fourth TMT joints. Hammertoe deformities of the third through fifth digits. Ligaments Lisfranc ligament is intact. Muscles and Tendons Partially visualized tenosynovitis of the flexor hallucis longus and flexor digitorum tendons at the level of the knot of Henry. The second  flexor digitorum tendon courses around the medial aspect of second metatarsal head at the level of the dislocated second MTP joint joint. Moderate atrophy of the intrinsic musculature of the foot with diffuse edema. Soft tissue There is a soft tissue wound at the plantar aspect of the forefoot, overlying the level of the second metatarsal head which appears to track deep into the dorsal soft tissues between the second and third MTP joints into a T2 hyperintense fluid collection containing air. This collection measures approximately 1.7 x 1.7 by 1.6 cm (series 6, image 14 and series 8, image 14). There is diffuse subcutaneous edema throughout  the forefoot, most pronounced dorsally. IMPRESSION: 1. Findings compatible with osteomyelitis of the second metatarsal and the third proximal phalanx. There is dorsal dislocation at the level of second MTP joint. 2. T2 hyperintensity of the second proximal phalanx without appreciable corresponding T1 hypointense signal abnormality, suggestive of reactive marrow edema. 3. Soft tissue wound at the plantar aspect of the forefoot overlying the level of the second metatarsal head which appears to track deep into the dorsal soft tissues between the second and third MTP joints into a fluid collection containing air. The sterility of the collection remains indeterminate, however, these findings are concerning for gas-forming soft tissue infection. 4. Diffuse edema throughout the forefoot, concerning for cellulitis. Moderate atrophy of the intrinsic musculature of the foot with diffuse edema, which could represent myositis. 5. Status post amputation of the first ray at the level of the mid first metatarsal. The transsection margins appear intact. Electronically Signed   By: Hart Robinsons M.D.   On: 06/13/2023 08:49   DG Foot Complete Right Result Date: 06/12/2023 Please see detailed radiograph report in office note.     LOS: 1 day    Jacquelin Hawking, MD [facility] 06/14/2023, 8:48 AM   If 7PM-7AM, please contact night-coverage [email or web address]

## 2023-06-15 DIAGNOSIS — L089 Local infection of the skin and subcutaneous tissue, unspecified: Secondary | ICD-10-CM | POA: Diagnosis not present

## 2023-06-15 DIAGNOSIS — E11628 Type 2 diabetes mellitus with other skin complications: Secondary | ICD-10-CM | POA: Diagnosis not present

## 2023-06-15 LAB — CBC
HCT: 36.3 % (ref 36.0–46.0)
Hemoglobin: 11.8 g/dL — ABNORMAL LOW (ref 12.0–15.0)
MCH: 28 pg (ref 26.0–34.0)
MCHC: 32.5 g/dL (ref 30.0–36.0)
MCV: 86.2 fL (ref 80.0–100.0)
Platelets: 294 10*3/uL (ref 150–400)
RBC: 4.21 MIL/uL (ref 3.87–5.11)
RDW: 15.8 % — ABNORMAL HIGH (ref 11.5–15.5)
WBC: 8.9 10*3/uL (ref 4.0–10.5)
nRBC: 0 % (ref 0.0–0.2)

## 2023-06-15 LAB — WOUND CULTURE
MICRO NUMBER:: 15964717
SPECIMEN QUALITY:: ADEQUATE

## 2023-06-15 LAB — BASIC METABOLIC PANEL
Anion gap: 11 (ref 5–15)
BUN: 20 mg/dL (ref 8–23)
CO2: 23 mmol/L (ref 22–32)
Calcium: 8.2 mg/dL — ABNORMAL LOW (ref 8.9–10.3)
Chloride: 106 mmol/L (ref 98–111)
Creatinine, Ser: 2.06 mg/dL — ABNORMAL HIGH (ref 0.44–1.00)
GFR, Estimated: 24 mL/min — ABNORMAL LOW (ref >60)
Glucose, Bld: 151 mg/dL — ABNORMAL HIGH (ref 70–99)
Potassium: 3.5 mmol/L (ref 3.5–5.1)
Sodium: 140 mmol/L (ref 135–145)

## 2023-06-15 LAB — GLUCOSE, CAPILLARY
Glucose-Capillary: 171 mg/dL — ABNORMAL HIGH (ref 70–99)
Glucose-Capillary: 163 mg/dL — ABNORMAL HIGH (ref 70–99)
Glucose-Capillary: 255 mg/dL — ABNORMAL HIGH (ref 70–99)
Glucose-Capillary: 86 mg/dL (ref 70–99)

## 2023-06-15 MED ORDER — ADULT MULTIVITAMIN W/MINERALS CH
1.0000 | ORAL_TABLET | Freq: Every day | ORAL | Status: DC
Start: 2023-06-15 — End: 2023-06-19
  Administered 2023-06-15 – 2023-06-19 (×4): 1 via ORAL
  Filled 2023-06-15 (×4): qty 1

## 2023-06-15 MED ORDER — SODIUM CHLORIDE 0.9 % IV SOLN: INTRAVENOUS | Status: AC

## 2023-06-15 MED ORDER — ENSURE ENLIVE PO LIQD
237.0000 mL | Freq: Two times a day (BID) | ORAL | Status: DC
Start: 2023-06-15 — End: 2023-06-19
  Administered 2023-06-15 – 2023-06-19 (×7): 237 mL via ORAL

## 2023-06-15 MED ORDER — FEBUXOSTAT 40 MG PO TABS
40.0000 mg | ORAL_TABLET | Freq: Every day | ORAL | Status: DC
  Administered 2023-06-17 – 2023-06-19 (×3): 40 mg via ORAL
  Filled 2023-06-15 (×4): qty 1

## 2023-06-15 MED ORDER — PRAVASTATIN SODIUM 10 MG PO TABS
10.0000 mg | ORAL_TABLET | Freq: Every day | ORAL | Status: DC
  Administered 2023-06-15 – 2023-06-18 (×4): 10 mg via ORAL
  Filled 2023-06-15 (×6): qty 1

## 2023-06-15 NOTE — Plan of Care (Signed)

## 2023-06-15 NOTE — Progress Notes (Signed)
PROGRESS NOTE    Joy Tapia  HQI:[id number] DOB: 07/13/1940 DOA: 06/12/2023 PCP: System, Provider Not In   Brief Narrative: Joy Tapia is a 83 y.o. female with a history of diabetes mellitus type 2, hypertension, CKD stage IV, hyperlipidemia, mild cognitive impairment.  Patient presented secondary to a progressively worsening wound infection with evidence of osteomyelitis requiring amputation.  Empiric antibiotics started.  Podiatry consulted for management.   Assessment/Plan:  Diabetic right foot infection Osteomyelitis of right foot Worsening right foot ulcer with evidence of osteomyelitis on x-ray, confirmed on MRI affecting the second metatarsal and third proximal phalanx. Associated bone necrosis. Podiatry consulted with plan for amputation. ABIs ordered and are significant for normal right side and abnormal left TBI. Empiric antibiotics started. -Continue Vancomycin, Ceftriaxone and Flagyl -Follow-up podiatry recommendations: plan for TMA  Primary hypertension -Continue Toprol XL  CKD stage IV Stable.  Diabetes mellitus type 2 Seems to be well controlled using last hemoglobin A1C of 7.4% from 06/2022. Patient is on Levemir and glimepiride as an outpatient. Patient started on Semglee and SSI on admission. -Continue Semglee and SSI -Will likely discontinue glimepiride on discharge  Mild cognitive impairment Noted. Patient states that she has a healthcare power of attorney, but that she is not her daughter, as is documented in chart, but rather a friend.  Hyperlipidemia -Continue pravastatin   DVT prophylaxis: Lovenox Code Status:   Code Status: Full Code Family Communication: None at bedside Disposition Plan: Discharge pending podiatry recommendations/management and eventual PT/OT recommendations   Consultants:  Podiatry  Procedures:  None  Antimicrobials: Vancomycin Ceftriaxone Flagyl    Subjective: No concerns or questions this morning.  No issues from overnight.  Objective: BP (!) 148/43 (BP Location: Left Arm)   Pulse 67   Temp 97.6 F (36.4 C) (Oral)   Resp 18   Ht 4\' 11"  (1.499 m)   Wt 54.4 kg   SpO2 93%   BMI 24.24 kg/m   Examination:  General exam: Appears calm and comfortable Respiratory system: Clear to auscultation. Respiratory effort normal. Cardiovascular system: S1 & S2 heard, RRR. Gastrointestinal system: Abdomen is nondistended, soft and nontender. Normal bowel sounds heard. Central nervous system: Alert and oriented. Psychiatry: Judgement and insight appear normal. Mood & affect appropriate.    Data Reviewed: I have personally reviewed following labs and imaging studies   Last CBC Lab Results  Component Value Date   WBC 8.9 06/15/2023   HGB 11.8 (L) 06/15/2023   HCT 36.3 06/15/2023   MCV 86.2 06/15/2023   MCH 28.0 06/15/2023   RDW 15.8 (H) 06/15/2023   PLT 294 06/15/2023     Last metabolic panel Lab Results  Component Value Date   GLUCOSE 151 (H) 06/15/2023   NA 140 06/15/2023   K 3.5 06/15/2023   CL 106 06/15/2023   CO2 23 06/15/2023   BUN 20 06/15/2023   CREATININE 2.06 (H) 06/15/2023   GFRNONAA 24 (L) 06/15/2023   CALCIUM 8.2 (L) 06/15/2023   PROT 6.2 (L) 06/12/2023   ALBUMIN 2.9 (L) 06/12/2023   BILITOT 0.8 06/12/2023   ALKPHOS 95 06/12/2023   AST 45 (H) 06/12/2023   ALT 27 06/12/2023   ANIONGAP 11 06/15/2023     Creatinine Clearance: Estimated Creatinine Clearance: 16.1 mL/min (A) (by C-G formula based on SCr of 2.06 mg/dL (H)).  Recent Results (from the past 240 hours)  WOUND CULTURE     Status: None (Preliminary result)   Collection Time: 06/12/23 12:00 PM  Specimen: Foot, Right; Wound  Result Value Ref Range Status   MICRO NUMBER: [id number]  Preliminary   SPECIMEN QUALITY: Adequate  Preliminary   SOURCE: NOT GIVEN  Preliminary   STATUS: PRELIMINARY  Preliminary   GRAM STAIN:   Preliminary    No white blood cells seen No epithelial cells seen Few Gram  negative bacilli Few Gram positive cocci in pairs  Blood Cultures x 2 sites     Status: None (Preliminary result)   Collection Time: 06/12/23  4:56 PM   Specimen: BLOOD  Result Value Ref Range Status   Specimen Description BLOOD SITE NOT SPECIFIED  Final   Special Requests   Final    BOTTLES DRAWN AEROBIC ONLY Blood Culture results may not be optimal due to an inadequate volume of blood received in culture bottles   Culture   Final    NO GROWTH 2 DAYS Performed at [facility], [address], [place], [place] [address]    Report Status PENDING  Incomplete  Blood Cultures x 2 sites     Status: None (Preliminary result)   Collection Time: 06/13/23  3:15 PM   Specimen: BLOOD RIGHT HAND  Result Value Ref Range Status   Specimen Description BLOOD RIGHT HAND  Final   Special Requests   Final    BOTTLES DRAWN AEROBIC AND ANAEROBIC Blood Culture results may not be optimal due to an inadequate volume of blood received in culture bottles   Culture   Final    NO GROWTH < 24 HOURS Performed at [facility], [address], [place], [place] [address]    Report Status PENDING  Incomplete      Radiology Studies: VAS [place] ABI WITH/WO TBI Result Date: 06/13/2023  LOWER EXTREMITY DOPPLER STUDY Patient Name:  Joy Tapia  Date of Exam:   06/13/2023 Medical Rec #: [id number]            Accession #:    [id number] Date of Birth: December 25, 1940            Patient Gender: F Patient Age:   13 years Exam Location:  [facility] Procedure:      VAS [place] ABI WITH/WO TBI Referring Phys: Lyda Perone --------------------------------------------------------------------------------  Indications: Ulceration. Right great toe amputation. High Risk         Hypertension, hyperlipidemia, Diabetes, past history of Factors:          smoking.  Limitations: Today's exam was limited due to involuntary patient movement,              bandages and Left foot in cast. Comparison Study: No prior exam. Performing  Technologist: Fernande Bras  Examination Guidelines: A complete evaluation includes at minimum, Doppler waveform signals and systolic blood pressure reading at the level of bilateral brachial, anterior tibial, and posterior tibial arteries, when vessel segments are accessible. Bilateral testing is considered an integral part of a complete examination. Photoelectric Plethysmograph (PPG) waveforms and toe systolic pressure readings are included as required and additional duplex testing as needed. Limited examinations for reoccurring indications may be performed as noted.  ABI Findings: +---------+------------------+-----+----------+----------+ Right    Rt Pressure (mmHg)IndexWaveform  Comment    +---------+------------------+-----+----------+----------+ Brachial 163                    triphasic            +---------+------------------+-----+----------+----------+ PTA      132  0.76 monophasic           +---------+------------------+-----+----------+----------+ DP       180               1.03 monophasic           +---------+------------------+-----+----------+----------+ Great Toe145               0.83 Normal    2nd digit. +---------+------------------+-----+----------+----------+ +---------+------------------+-----+----------+-------+ Left     Lt Pressure (mmHg)IndexWaveform  Comment +---------+------------------+-----+----------+-------+ Brachial 174                    triphasic         +---------+------------------+-----+----------+-------+ PTA      170               0.98 monophasic        +---------+------------------+-----+----------+-------+ DP       172               0.99 monophasic        +---------+------------------+-----+----------+-------+ Chaney Malling               1.34 Abnormal          +---------+------------------+-----+----------+-------+ +-------+-----------+-----------+------------+------------+ ABI/TBIToday's  ABIToday's TBIPrevious ABIPrevious TBI +-------+-----------+-----------+------------+------------+ Right  1.03       .83                                 +-------+-----------+-----------+------------+------------+ Left   .99        1.34                                +-------+-----------+-----------+------------+------------+   Summary: Right: Resting right ankle-brachial index is within normal range. The right toe-brachial index is normal. Left: Resting left ankle-brachial index is within normal range. The left toe-brachial index is abnormal. *See table(s) above for measurements and observations.  Electronically signed by Sherald Hess MD on 06/13/2023 at 5:06:01 PM.    Final       LOS: 2 days    Jacquelin Hawking, MD [facility] 06/15/2023, 8:48 AM   If 7PM-7AM, please contact night-coverage [email or web address]

## 2023-06-15 NOTE — Progress Notes (Signed)
Initial Nutrition Assessment  DOCUMENTATION CODES:   Not applicable  INTERVENTION:  Liberalize diet Ensure Plus High Protein po BID, each supplement provides 350 kcal and 20 grams of protein. Multivitamin/minerals daily   NUTRITION DIAGNOSIS:   Increased nutrient needs related to chronic illness as evidenced by estimated needs.    GOAL:   Patient will meet greater than or equal to 90% of their needs    MONITOR:   PO intake  REASON FOR ASSESSMENT:   Consult Wound healing  ASSESSMENT:   83 y.o. female with  BLE foot ulceration.  Seen in DPM office earlier today.  She has been getting wound care and taking antibiotics at this point. Left foot ulcer appears improved.  Right foot unfortunately has increased redness and swelling. X rays at [facility] concerning for osteomyelitis.  Pt sent in to ED for admission for MRI, IV ABx, and likely amputation. PMH; DM2, HTN, CKD 4, mild cognitive impairment, DVT, Gout, GERD, migraines, hoarding DO. Remote review.  All information obtained through team and EMR; Patient has TRANSMETATARSAL AMPUTATION - Right  scheduled for 1/20.  Review of weights revealed 6% weight loss since 04/26/23. Appears to trend up and down as noted in below chart.  NT reports patient at almost all of her breakfast. She is slightly confused on when her surgery is scheduled for she thought today and was not going to eat. Although was informed that it was not today and she ate her breakfast. Review of EMR revealed no appetite changes. She is able to self feed and there are no apparent chewing or swallowing concerns noted at this time. There is no benefit to excessive dietary restrictions related advanced age, increased nutrient needs. Patient would benefit better from liberalized diet to help meet increased nutrient needs and promote better oral intake.   Admit weight: 54.4 kg Weight history: 06/12/23 54.4 kg  04/26/23 58.1 kg  01/10/23 56.2 kg  09/27/22 54 kg  09/06/22  50.7 kg  06/28/22 50.8 kg      Average Meal Intake: No current documentation.   Nutritionally Relevant Medications: Scheduled Meds:  furosemide  40 mg Oral Daily   metoprolol succinate  50 mg Oral Daily   metroNIDAZOLE  500 mg Oral Q12H    Labs Reviewed:  CBG ranges from 152-151 mg/dL over the last 24 hours HgbA1c 9.2    NUTRITION - FOCUSED PHYSICAL EXAM:  Deferred   Diet Order:   Diet Order             Diet regular Room service appropriate? Yes with Assist; Fluid consistency: Thin  Diet effective now                   EDUCATION NEEDS:   Education needs have been addressed  Skin:  Skin Assessment: Skin Integrity Issues: Skin Integrity Issues:: Other (Comment) Other: Right foot wound  Last BM:  1/18  Height:   Ht Readings from Last 1 Encounters:  06/12/23 4\' 11"  (1.499 m)    Weight:   Wt Readings from Last 1 Encounters:  06/12/23 54.4 kg    Ideal Body Weight:     BMI:  Body mass index is 24.24 kg/m.  Estimated Nutritional Needs:   Kcal:  1650- 1925 kcal  Protein:  75-90 g  Fluid:  51ml/kcal    Jamelle Haring RDN, LDN Clinical Dietitian   If unable to reach, please contact "[facility] Inpatient" secure chat group between 8 am-4 pm daily"

## 2023-06-16 ENCOUNTER — Other Ambulatory Visit: Payer: Self-pay

## 2023-06-16 ENCOUNTER — Encounter (HOSPITAL_COMMUNITY)
Admission: EM | Disposition: A | Discharge status: Home Health | Payer: Self-pay | Source: Home / Self Care | Attending: Family Medicine

## 2023-06-16 ENCOUNTER — Encounter (HOSPITAL_COMMUNITY): Payer: Self-pay | Admitting: Internal Medicine

## 2023-06-16 ENCOUNTER — Inpatient Hospital Stay (HOSPITAL_COMMUNITY): Payer: Medicare Other

## 2023-06-16 ENCOUNTER — Inpatient Hospital Stay (HOSPITAL_COMMUNITY): Payer: Medicare Other | Admitting: Anesthesiology

## 2023-06-16 DIAGNOSIS — L089 Local infection of the skin and subcutaneous tissue, unspecified: Secondary | ICD-10-CM | POA: Diagnosis not present

## 2023-06-16 DIAGNOSIS — E1169 Type 2 diabetes mellitus with other specified complication: Secondary | ICD-10-CM

## 2023-06-16 DIAGNOSIS — N184 Chronic kidney disease, stage 4 (severe): Secondary | ICD-10-CM | POA: Diagnosis not present

## 2023-06-16 DIAGNOSIS — M869 Osteomyelitis, unspecified: Secondary | ICD-10-CM

## 2023-06-16 DIAGNOSIS — L97522 Non-pressure chronic ulcer of other part of left foot with fat layer exposed: Secondary | ICD-10-CM | POA: Diagnosis not present

## 2023-06-16 DIAGNOSIS — I129 Hypertensive chronic kidney disease with stage 1 through stage 4 chronic kidney disease, or unspecified chronic kidney disease: Secondary | ICD-10-CM

## 2023-06-16 DIAGNOSIS — M216X1 Other acquired deformities of right foot: Secondary | ICD-10-CM | POA: Diagnosis not present

## 2023-06-16 DIAGNOSIS — E11628 Type 2 diabetes mellitus with other skin complications: Secondary | ICD-10-CM | POA: Diagnosis not present

## 2023-06-16 HISTORY — PX: INCISION AND DRAINAGE OF WOUND: SHX1803

## 2023-06-16 HISTORY — PX: TRANSMETATARSAL AMPUTATION: SHX6197

## 2023-06-16 LAB — GLUCOSE, CAPILLARY
Glucose-Capillary: 143 mg/dL — ABNORMAL HIGH (ref 70–99)
Glucose-Capillary: 151 mg/dL — ABNORMAL HIGH (ref 70–99)
Glucose-Capillary: 151 mg/dL — ABNORMAL HIGH (ref 70–99)
Glucose-Capillary: 165 mg/dL — ABNORMAL HIGH (ref 70–99)
Glucose-Capillary: 176 mg/dL — ABNORMAL HIGH (ref 70–99)
Glucose-Capillary: 185 mg/dL — ABNORMAL HIGH (ref 70–99)
Glucose-Capillary: 192 mg/dL — ABNORMAL HIGH (ref 70–99)
Glucose-Capillary: 378 mg/dL — ABNORMAL HIGH (ref 70–99)
Glucose-Capillary: 380 mg/dL — ABNORMAL HIGH (ref 70–99)

## 2023-06-16 LAB — BASIC METABOLIC PANEL
Anion gap: 11 (ref 5–15)
BUN: 18 mg/dL (ref 8–23)
CO2: 23 mmol/L (ref 22–32)
Calcium: 8.3 mg/dL — ABNORMAL LOW (ref 8.9–10.3)
Chloride: 105 mmol/L (ref 98–111)
Creatinine, Ser: 1.63 mg/dL — ABNORMAL HIGH (ref 0.44–1.00)
GFR, Estimated: 31 mL/min — ABNORMAL LOW (ref >60)
Glucose, Bld: 162 mg/dL — ABNORMAL HIGH (ref 70–99)
Potassium: 3.7 mmol/L (ref 3.5–5.1)
Sodium: 139 mmol/L (ref 135–145)

## 2023-06-16 SURGERY — AMPUTATION, FOOT, TRANSMETATARSAL
Anesthesia: General | Site: Foot | Laterality: Right

## 2023-06-16 MED ORDER — SUGAMMADEX SODIUM 200 MG/2ML IV SOLN
INTRAVENOUS | Status: DC | PRN
  Administered 2023-06-16: 110 mg via INTRAVENOUS

## 2023-06-16 MED ORDER — FENTANYL CITRATE (PF) 250 MCG/5ML IJ SOLN
INTRAMUSCULAR | Status: AC
  Filled 2023-06-16: qty 5

## 2023-06-16 MED ORDER — SUCCINYLCHOLINE CHLORIDE 200 MG/10ML IV SOSY
PREFILLED_SYRINGE | INTRAVENOUS | Status: DC | PRN
  Administered 2023-06-16: 80 mg via INTRAVENOUS

## 2023-06-16 MED ORDER — ROCURONIUM BROMIDE 10 MG/ML (PF) SYRINGE
PREFILLED_SYRINGE | INTRAVENOUS | Status: DC | PRN
Start: 2023-06-16 — End: 2023-06-16
  Administered 2023-06-16: 20 mg via INTRAVENOUS

## 2023-06-16 MED ORDER — PROPOFOL 10 MG/ML IV BOLUS
INTRAVENOUS | Status: DC | PRN
  Administered 2023-06-16: 100 mg via INTRAVENOUS
  Administered 2023-06-16: 50 mg via INTRAVENOUS

## 2023-06-16 MED ORDER — LIDOCAINE HCL (PF) 1 % IJ SOLN
INTRAMUSCULAR | Status: AC
  Filled 2023-06-16: qty 30

## 2023-06-16 MED ORDER — ACETAMINOPHEN 500 MG PO TABS
1000.0000 mg | ORAL_TABLET | Freq: Once | ORAL | Status: AC
  Administered 2023-06-16: 1000 mg via ORAL
  Filled 2023-06-16: qty 2

## 2023-06-16 MED ORDER — PHENOL 1.4 % MT LIQD
1.0000 | OROMUCOSAL | Status: DC | PRN
  Administered 2023-06-16 (×2): 1 via OROMUCOSAL
  Filled 2023-06-16: qty 177

## 2023-06-16 MED ORDER — ARTIFICIAL TEARS OPHTHALMIC OINT
TOPICAL_OINTMENT | OPHTHALMIC | Status: AC
  Filled 2023-06-16: qty 7

## 2023-06-16 MED ORDER — LIDOCAINE 2% (20 MG/ML) 5 ML SYRINGE
INTRAMUSCULAR | Status: DC | PRN
  Administered 2023-06-16: 100 mg via INTRAVENOUS

## 2023-06-16 MED ORDER — CHLORHEXIDINE GLUCONATE 0.12 % MT SOLN
15.0000 mL | Freq: Once | OROMUCOSAL | Status: AC
Start: 2023-06-16 — End: 2023-06-16
  Administered 2023-06-16: 15 mL via OROMUCOSAL
  Filled 2023-06-16: qty 15

## 2023-06-16 MED ORDER — FENTANYL CITRATE (PF) 250 MCG/5ML IJ SOLN
INTRAMUSCULAR | Status: DC | PRN
  Administered 2023-06-16 (×2): 25 ug via INTRAVENOUS

## 2023-06-16 MED ORDER — ORAL CARE MOUTH RINSE: 15.0000 mL | Freq: Once | OROMUCOSAL | Status: AC

## 2023-06-16 MED ORDER — FENTANYL CITRATE (PF) 100 MCG/2ML IJ SOLN
INTRAMUSCULAR | Status: AC
  Filled 2023-06-16: qty 2

## 2023-06-16 MED ORDER — BUPIVACAINE HCL (PF) 0.5 % IJ SOLN
INTRAMUSCULAR | Status: DC | PRN
  Administered 2023-06-16: 5 mL

## 2023-06-16 MED ORDER — SODIUM CHLORIDE 0.9 % IR SOLN
Status: DC | PRN
  Administered 2023-06-16: 1000 mL

## 2023-06-16 MED ORDER — ONDANSETRON HCL 4 MG/2ML IJ SOLN
INTRAMUSCULAR | Status: DC | PRN
  Administered 2023-06-16: 4 mg via INTRAVENOUS

## 2023-06-16 MED ORDER — DEXAMETHASONE SODIUM PHOSPHATE 10 MG/ML IJ SOLN
INTRAMUSCULAR | Status: DC | PRN
  Administered 2023-06-16: 10 mg via INTRAVENOUS

## 2023-06-16 MED ORDER — LACTATED RINGERS IV SOLN: INTRAVENOUS | Status: DC

## 2023-06-16 MED ORDER — METRONIDAZOLE 500 MG/100ML IV SOLN
INTRAVENOUS | Status: AC
  Filled 2023-06-16: qty 100

## 2023-06-16 MED ORDER — METRONIDAZOLE 500 MG/100ML IV SOLN
INTRAVENOUS | Status: DC | PRN
Start: 2023-06-16 — End: 2023-06-16
  Administered 2023-06-16: 500 mg via INTRAVENOUS

## 2023-06-16 MED ORDER — ONDANSETRON HCL 4 MG/2ML IJ SOLN
INTRAMUSCULAR | Status: AC
  Filled 2023-06-16: qty 2

## 2023-06-16 MED ORDER — INSULIN ASPART 100 UNIT/ML IJ SOLN
0.0000 [IU] | INTRAMUSCULAR | Status: DC
  Administered 2023-06-16: 9 [IU] via SUBCUTANEOUS
  Administered 2023-06-16 (×2): 2 [IU] via SUBCUTANEOUS
  Administered 2023-06-16: 9 [IU] via SUBCUTANEOUS

## 2023-06-16 MED ORDER — LIDOCAINE HCL (PF) 1 % IJ SOLN
INTRAMUSCULAR | Status: DC | PRN
  Administered 2023-06-16: 5 mL

## 2023-06-16 MED ORDER — BUPIVACAINE HCL (PF) 0.5 % IJ SOLN
INTRAMUSCULAR | Status: AC
  Filled 2023-06-16: qty 30

## 2023-06-16 MED ORDER — EPHEDRINE SULFATE-NACL 50-0.9 MG/10ML-% IV SOSY
PREFILLED_SYRINGE | INTRAVENOUS | Status: DC | PRN
  Administered 2023-06-16: 5 mg via INTRAVENOUS

## 2023-06-16 MED ORDER — DEXTROSE 50 % IV SOLN: 12.5000 g | INTRAVENOUS | Status: DC | PRN

## 2023-06-16 MED ORDER — INSULIN ASPART 100 UNIT/ML IJ SOLN: 0.0000 [IU] | INTRAMUSCULAR | Status: DC

## 2023-06-16 MED ORDER — 0.9 % SODIUM CHLORIDE (POUR BTL) OPTIME
TOPICAL | Status: DC | PRN
  Administered 2023-06-16: 1000 mL

## 2023-06-16 MED ORDER — DEXTROSE 5 % IV SOLN
INTRAVENOUS | Status: DC | PRN
  Administered 2023-06-16: 2 g via INTRAVENOUS

## 2023-06-16 MED ORDER — PROPOFOL 10 MG/ML IV BOLUS
INTRAVENOUS | Status: AC
  Filled 2023-06-16: qty 20

## 2023-06-16 MED ORDER — DEXAMETHASONE SODIUM PHOSPHATE 10 MG/ML IJ SOLN
INTRAMUSCULAR | Status: AC
Start: 2023-06-16 — End: ?
  Filled 2023-06-16: qty 1

## 2023-06-16 MED ORDER — PHENYLEPHRINE HCL-NACL 20-0.9 MG/250ML-% IV SOLN
INTRAVENOUS | Status: DC | PRN
  Administered 2023-06-16: 50 ug/min via INTRAVENOUS

## 2023-06-16 MED ORDER — INSULIN ASPART 100 UNIT/ML IJ SOLN: 0.0000 [IU] | INTRAMUSCULAR | Status: DC | PRN

## 2023-06-16 SURGICAL SUPPLY — 35 items
ALLOGRAFT SALERA SHEET 3X8 (Graft) IMPLANT
BLADE OSC/SAGITTAL MD 5.5X18 (BLADE) ×2 IMPLANT
BLADE SURG 10 STRL SS (BLADE) ×2 IMPLANT
BLADE SURG 15 STRL LF DISP TIS (BLADE) ×4 IMPLANT
BNDG COHESIVE 3X5 TAN ST LF (GAUZE/BANDAGES/DRESSINGS) ×2 IMPLANT
BNDG ELASTIC 3INX 5YD STR LF (GAUZE/BANDAGES/DRESSINGS) ×2 IMPLANT
BNDG ELASTIC 4INX 5YD STR LF (GAUZE/BANDAGES/DRESSINGS) IMPLANT
BNDG ELASTIC 6X10 VLCR STRL LF (GAUZE/BANDAGES/DRESSINGS) IMPLANT
BNDG ESMARK 4X9 LF (GAUZE/BANDAGES/DRESSINGS) ×2 IMPLANT
BNDG GAUZE DERMACEA FLUFF 4 (GAUZE/BANDAGES/DRESSINGS) IMPLANT
DRAPE BILATERAL LIMB T (DRAPES) IMPLANT
DRSG ADAPTIC 3X8 NADH LF (GAUZE/BANDAGES/DRESSINGS) IMPLANT
DRSG XEROFORM 1X8 (GAUZE/BANDAGES/DRESSINGS) IMPLANT
ELECT REM PT RETURN 9FT ADLT (ELECTROSURGICAL) ×2 IMPLANT
ELECTRODE REM PT RTRN 9FT ADLT (ELECTROSURGICAL) ×2 IMPLANT
GAUZE PAD ABD 8X10 STRL (GAUZE/BANDAGES/DRESSINGS) IMPLANT
GAUZE SPONGE 2X2 STRL 8-PLY (GAUZE/BANDAGES/DRESSINGS) ×4 IMPLANT
GAUZE SPONGE 4X4 12PLY STRL (GAUZE/BANDAGES/DRESSINGS) ×2 IMPLANT
GAUZE STRETCH 2X75IN STRL (MISCELLANEOUS) ×2 IMPLANT
GAUZE XEROFORM 1X8 LF (GAUZE/BANDAGES/DRESSINGS) ×2 IMPLANT
GLOVE BIO SURGEON STRL SZ7.5 (GLOVE) ×2 IMPLANT
GLOVE BIOGEL PI IND STRL 7.5 (GLOVE) ×2 IMPLANT
GOWN STRL REUS W/ TWL LRG LVL3 (GOWN DISPOSABLE) ×4 IMPLANT
KIT BASIN OR (CUSTOM PROCEDURE TRAY) ×2 IMPLANT
NDL HYPO 25X1 1.5 SAFETY (NEEDLE) ×4 IMPLANT
NEEDLE HYPO 25X1 1.5 SAFETY (NEEDLE) ×2 IMPLANT
PACK ORTHO EXTREMITY (CUSTOM PROCEDURE TRAY) ×2 IMPLANT
PADDING CAST ABS COTTON 4X4 ST (CAST SUPPLIES) ×4 IMPLANT
SET HNDPC FAN SPRY TIP SCT (DISPOSABLE) IMPLANT
STAPLER VISISTAT 35W (STAPLE) IMPLANT
STOCKINETTE 4X48 STRL (DRAPES) ×2 IMPLANT
SUT PROLENE 3 0 PS 2 (SUTURE) IMPLANT
SYR CONTROL 10ML LL (SYRINGE) ×4 IMPLANT
UNDERPAD 30X36 HEAVY ABSORB (UNDERPADS AND DIAPERS) ×2 IMPLANT
WATER STERILE IRR 1000ML POUR (IV SOLUTION) ×2 IMPLANT

## 2023-06-16 NOTE — Anesthesia Preprocedure Evaluation (Addendum)
Anesthesia Evaluation  Patient identified by MRN, date of birth, ID band Patient awake    Reviewed: Allergy & Precautions, NPO status , Patient's Chart, lab work & pertinent test results, reviewed documented beta blocker date and time   Airway Mallampati: II  TM Distance: >3 FB Neck ROM: Full    Dental  (+) Dental Advisory Given, Partial Upper   Pulmonary former smoker   Pulmonary exam normal breath sounds clear to auscultation       Cardiovascular hypertension, Pt. on home beta blockers + DVT  Normal cardiovascular exam Rhythm:Regular Rate:Normal     Neuro/Psych  Headaches  negative psych ROS   GI/Hepatic Neg liver ROS,GERD  Medicated,,  Endo/Other  diabetes, Type 2, Insulin Dependent, Oral Hypoglycemic Agents    Renal/GU Renal InsufficiencyRenal disease     Musculoskeletal  (+) Arthritis ,  Osteomyelitis right foot, ulceration of left foot   Abdominal   Peds  Hematology  (+) Blood dyscrasia, anemia   Anesthesia Other Findings Day of surgery medications reviewed with the patient.  Reproductive/Obstetrics                             Anesthesia Physical Anesthesia Plan  ASA: 3  Anesthesia Plan: General   Post-op Pain Management: Tylenol PO (pre-op)*   Induction: Intravenous  PONV Risk Score and Plan: 3 and TIVA, Dexamethasone and Ondansetron  Airway Management Planned: LMA  Additional Equipment:   Intra-op Plan:   Post-operative Plan: Extubation in OR  Informed Consent: I have reviewed the patients History and Physical, chart, labs and discussed the procedure including the risks, benefits and alternatives for the proposed anesthesia with the patient or authorized representative who has indicated his/her understanding and acceptance.     Dental advisory given  Plan Discussed with: CRNA  Anesthesia Plan Comments:         Anesthesia Quick Evaluation

## 2023-06-16 NOTE — Op Note (Signed)
Full Operative Report  Date of Operation: 12:02 PM, 06/16/2023   Patient: Joy Tapia - 83 y.o. female  Surgeon: [facility], DPM   Assistant: None  Diagnosis: Osteomyelitis right foot, ulceration of left foot  Procedure:  1. Excisional debridement of ulceration to sub cutaneous fat level with prep for graft 1x1x0.2 cm, left 2. Application amniotic graft 1.5 x 1.5 x 0.3 cm, left  3. Transmetatarsal amputation of right foot 4. Tendo achilles lengthening of right foot    Anesthesia: General  No responsible provider has been recorded for the case.  Anesthesiologist: Collene Schlichter, MD CRNA: Sudie Grumbling, CRNA Student Nurse Anesthetist: Treasa School, RN   Estimated Blood Loss: 30 mL  Hemostasis: 1) Anatomical dissection, mechanical compression, electrocautery 2) No tourniquet was used  Implants: Implant Name Type Inv. Item Serial No. Manufacturer Lot No. LRB No. Used Action  ALLOGRAFT SALERA SHEET 3X8 - [id number] Graft ALLOGRAFT SALERA SHEET 3X8 [id number] MUSCULOSKELETL TRANSPLANT FNDN  Bilateral 1 Implanted    Materials: Prolene 3-0 and staples  Injectables: 1) Pre-operatively: 20 cc of 50:50 mixture 1%lidocaine plain and 0.5% marcaine plain 2) Post-operatively: None   Specimens: Pathology: right forefoot for pathology.  Microbiology: right 2nd met head for bone cutlure, left foot wound swab    Antibiotics: IV antibiotics given per schedule on the floor  Drains: None  Complications: Patient tolerated the procedure well without complication.   Operative findings: As below in detailed report  Indications for Procedure: Joy Tapia presents to [facility], [place] with a chief complaint of chronic ulceration to bilateral foot, right worse than left. R with concern for multifocal osteomyelitis. Left with superficial chronic non healing ulceration. The patient has failed conservative treatments of various modalities.  At this time the patient has elected to proceed with surgical correction. All alternatives, risks, and complications of the procedures were thoroughly explained to the patient. Patient exhibits appropriate understanding of all discussion points and informed consent was signed and obtained in the chart with no guarantees to surgical outcome given or implied.  Description of Procedure: Patient was brought to the operating room. Patient remained on their hospital bed in the supine position. A surgical timeout was performed and all members of the operating room, the procedure, and the surgical site were identified. anesthesia occurred as per anesthesia record. Local anesthetic as previously described was then injected about the operative field in a local infiltrative block.  The operative lower extremity as noted above was then prepped and draped in the usual sterile manner. The following procedure then began.  LEFT FOOT: The wound was completely inspected. The wound plantar forefoot measured 1x1x0.2 cm pre debridement. The wound base had fibrotic tissue. There were no signs of infection, no sinus tracts. The wound was then prepped for graft coverage. The wound margins were sharply curretaged. The entire wound base thoroughly excisionally debrided with curettes and rongeurs to the level of healthy viable tissue. The wound was then irrigated with saline. The wound measured 1.5 x 1.5 x 0.3 cm. Hemostasis was achieved with electrocautery.   The amniotic graft selera mini membrane cut to above measurements was then placed within the wound bed, covering the entire wound. This was then affixed with surgical staples.  This foot was then dressed  RIGHT FOOT: Attention was directed to the right lower extremity. A fish-mouth type incision was made proximal to the web spaces and encompassed the entire forefoot. The full-thickness incision was made with a longer plantar flap  to allow for wound closure. The incision was  continued through the soft tissue down to the shafts of the metatarsal bones. A 15 blade was then used to free up the periosteum on all the metatarsal shafts. Using an oscillating saw, the metatarsals were cut in a dorsal distal to plantar proximal orientation. The first metatarsal was beveled so that the medial cortex was shorter than the lateral, and the fifth metatarsal was beveled so that the lateral cortex was shorter than the medial, thus less prominent. The amputation was done so that a metatarsal parabola was maintained.  The distal portion including all the digits were freed from the metatarsals and soft tissue attachments. The specimen was passed off the field and sent for gross pathology. All remaining non-viable and necrotic tissues were sharply resected and removed. Extensor and flexors tendons were grasped with a hemostat and cut proximally. Good bleeding was noted through the case. . The surgical site was then flushed with of saline under power pulse lavage. The plantar flap was brought in approximation with the dorsal flap and the sutures material previously described was used for closure. Care was taken not to place the flaps under tension in order not to jeopardize the vascular supply.  Attention was directed to the posterior right ankle where the Achilles tendon was identified using topographical anatomy.  Here, a triple hemisection was performed to lengthen the Achilles tendon and thus improve dorsiflexion of the foot. Two centimeters from the Achilles insertion, a 15 blade was inserted parallel with the long axis of the tendon and then rotated 90 degrees medially to release the medial half of the tendon.  In a similar fashion, the 15 blade was then inserted midline of the Achilles tendon 4 cm from the insertion and rotated 90 degrees laterally to transect the lateral half of the tendon.  Lastly, the 15 blade was inserted midline of the tendon 6 cm proximal from the insertion and rotated  90 degrees medially to release the medial half of the tendon. This was all done under passive dorsiflexion and an increase in ankle range of motion in the sagittal plane was noted.  All 3 sites were then flushed with sterile normal saline and closed with the suture material previously described.   The surgical site was then dressed with xerform 4x4 abd pad kerlix and ace. The patient tolerated both the procedure and anesthesia well with vital signs stable throughout. The patient was transferred in good condition and all vital signs stable  from the OR to recovery under the discretion of anesthesia.  Condition: Vital signs stable, neurovascular status unchanged from preoperative   Surgical plan:  Expect clean margins. Left foot wound clean and healthy with graft. Right foot clean TMA site. No abscess or obvious necrosis identified, good bleeding. Graft applied to TMA site internally also. NWB to R foot in CAM boot. L foot WBAT in post op shoe. Recommend 7 days PO abx on DC.   The patient will be WBAT in a post op shoe to L foot and NWB in CAM boot to right foot to the operative limb until further instructed. The dressing is to remain clean, dry, and intact. Will continue to follow unless noted elsewhere.   Carlena Hurl, DPM [facility]

## 2023-06-16 NOTE — Anesthesia Postprocedure Evaluation (Signed)
Anesthesia Post Note  Patient: MONEA RISSMILLER  Procedure(s) Performed: TRANSMETATARSAL AMPUTATION WITH GRAFT PLACEMENT (Right: Foot) IRRIGATION AND DEBRIDEMENT WOUND LEFT FOOT ULCER AND GRAFT APPLICATION (Left: Foot)     Patient location during evaluation: PACU Anesthesia Type: General Level of consciousness: awake and alert Pain management: pain level controlled Vital Signs Assessment: post-procedure vital signs reviewed and stable Respiratory status: spontaneous breathing, nonlabored ventilation, respiratory function stable and patient connected to nasal cannula oxygen Cardiovascular status: blood pressure returned to baseline and stable Postop Assessment: no apparent nausea or vomiting Anesthetic complications: no   No notable events documented.  Last Vitals:  Vitals:   06/16/23 1215 06/16/23 1246  BP: 99/78 (!) 147/52  Pulse: 78   Resp: 16 15  Temp: 36.4 C   SpO2: 100% 100%    Last Pain:  Vitals:   06/16/23 1215  TempSrc:   PainSc: 0-No pain                 Collene Schlichter

## 2023-06-16 NOTE — Plan of Care (Signed)

## 2023-06-16 NOTE — Transfer of Care (Signed)
Immediate Anesthesia Transfer of Care Note  Patient: Joy Tapia  Procedure(s) Performed: TRANSMETATARSAL AMPUTATION WITH GRAFT PLACEMENT (Right: Foot) IRRIGATION AND DEBRIDEMENT WOUND LEFT FOOT ULCER AND GRAFT APPLICATION (Left: Foot)  Patient Location: PACU  Anesthesia Type:GA combined with regional for post-op pain  Level of Consciousness: awake, alert , oriented, and patient cooperative  Airway & Oxygen Therapy: Patient Spontanous Breathing and Patient connected to face mask oxygen  Post-op Assessment: Report given to RN, Post -op Vital signs reviewed and stable, and Patient moving all extremities  Post vital signs: Reviewed and stable  Last Vitals:  Vitals Value Taken Time  BP 121/27 06/16/23 1152  Temp 36.6 C 06/16/23 1153  Pulse 91 06/16/23 1154  Resp 19 06/16/23 1154  SpO2 100 % 06/16/23 1154  Vitals shown include unfiled device data.  Last Pain:  Vitals:   06/16/23 0857  TempSrc: Oral  PainSc: 0-No pain         Complications: No notable events documented.

## 2023-06-16 NOTE — Progress Notes (Signed)
PROGRESS NOTE    Joy Tapia  VHQ:[id number] DOB: 10-30-1940 DOA: 06/12/2023 PCP: System, Provider Not In   Brief Narrative: Joy Tapia is a 83 y.o. female with a history of diabetes mellitus type 2, hypertension, CKD stage IV, hyperlipidemia, mild cognitive impairment.  Patient presented secondary to a progressively worsening wound infection with evidence of osteomyelitis requiring amputation.  Empiric antibiotics started.  Podiatry consulted for management.   Assessment/Plan:  Diabetic right foot infection Osteomyelitis of right foot Worsening right foot ulcer with evidence of osteomyelitis on x-ray, confirmed on MRI affecting the second metatarsal and third proximal phalanx. Associated bone necrosis. Podiatry consulted with plan for amputation. ABIs ordered and are significant for normal right side and abnormal left TBI. Empiric antibiotics started. -Continue Vancomycin, Ceftriaxone and Flagyl -Follow-up podiatry recommendations: plan for TMA on 1/20  Primary hypertension -Continue Toprol XL  CKD stage IV Stable.  Diabetes mellitus type 2 Seems to be well controlled using last hemoglobin A1C of 7.4% from 06/2022. Patient is on Levemir and glimepiride as an outpatient. Patient started on Semglee and SSI on admission. -Continue Semglee and SSI -Will likely discontinue glimepiride on discharge  Mild cognitive impairment Noted. Patient states that she has a healthcare power of attorney, but that she is not her daughter, as is documented in chart, but rather a friend.  Hyperlipidemia -Continue pravastatin   DVT prophylaxis: Lovenox Code Status:   Code Status: Full Code Family Communication: None at bedside Disposition Plan: Discharge pending podiatry recommendations/management and eventual PT/OT recommendations   Consultants:  Podiatry  Procedures:  None  Antimicrobials: Vancomycin Ceftriaxone Flagyl    Subjective: No concerns this morning. She  understands the surgery she is about to have.  Objective: BP (!) 154/47 (BP Location: Right Arm)   Pulse 71   Temp 98.3 F (36.8 C)   Resp 18   Ht 4\' 11"  (1.499 m)   Wt 54.4 kg   SpO2 100%   BMI 24.24 kg/m   Examination:  General exam: Appears calm and comfortable Respiratory system: Clear to auscultation. Respiratory effort normal. Cardiovascular system: S1 & S2 heard, RRR. No murmurs Gastrointestinal system: Abdomen is nondistended, soft and nontender. Normal bowel sounds heard. Central nervous system: Alert and oriented. No focal neurological deficits. Musculoskeletal: No edema. No calf tenderness   Data Reviewed: I have personally reviewed following labs and imaging studies   Last CBC Lab Results  Component Value Date   WBC 8.9 06/15/2023   HGB 11.8 (L) 06/15/2023   HCT 36.3 06/15/2023   MCV 86.2 06/15/2023   MCH 28.0 06/15/2023   RDW 15.8 (H) 06/15/2023   PLT 294 06/15/2023     Last metabolic panel Lab Results  Component Value Date   GLUCOSE 162 (H) 06/16/2023   NA 139 06/16/2023   K 3.7 06/16/2023   CL 105 06/16/2023   CO2 23 06/16/2023   BUN 18 06/16/2023   CREATININE 1.63 (H) 06/16/2023   GFRNONAA 31 (L) 06/16/2023   CALCIUM 8.3 (L) 06/16/2023   PROT 6.2 (L) 06/12/2023   ALBUMIN 2.9 (L) 06/12/2023   BILITOT 0.8 06/12/2023   ALKPHOS 95 06/12/2023   AST 45 (H) 06/12/2023   ALT 27 06/12/2023   ANIONGAP 11 06/16/2023     Creatinine Clearance: Estimated Creatinine Clearance: 20.4 mL/min (A) (by C-G formula based on SCr of 1.63 mg/dL (H)).  Recent Results (from the past 240 hours)  WOUND CULTURE     Status: Abnormal   Collection Time: 06/12/23 12:00  PM   Specimen: Foot, Right; Wound  Result Value Ref Range Status   MICRO NUMBER: [id number]  Final   SPECIMEN QUALITY: Adequate  Final   SOURCE: NOT GIVEN  Final   STATUS: FINAL  Final   GRAM STAIN:   Final    No white blood cells seen No epithelial cells seen Few Gram negative bacilli Few Gram  positive cocci in pairs   ISOLATE 1: Pseudomonas aeruginosa (A)  Final    Comment: Light growth of Pseudomonas aeruginosa   COMMENT:   Final    No source was provided. The specimen was tested and reported based upon the test code ordered. If this is incorrect, please contact client services.      Susceptibility   Pseudomonas aeruginosa - AEROBIC CULT, GRAM STAIN NEGATIVE 1    CEFTAZIDIME 2 Sensitive     CEFEPIME <=1 Sensitive     CIPROFLOXACIN 0.5 Intermediate     LEVOFLOXACIN >=8 Resistant     GENTAMICIN <=1 Sensitive     IMIPENEM 2 Sensitive     PIP/TAZO 16 Sensitive     TOBRAMYCIN* <=1 Sensitive      * Legend: S = Susceptible  I = Intermediate R = Resistant  NS = Not susceptible SDD = Susceptible Dose Dependent * = Not Tested  NR = Not Reported **NN = See Therapy Comments   Blood Cultures x 2 sites     Status: None (Preliminary result)   Collection Time: 06/12/23  4:56 PM   Specimen: BLOOD  Result Value Ref Range Status   Specimen Description BLOOD SITE NOT SPECIFIED  Final   Special Requests   Final    BOTTLES DRAWN AEROBIC ONLY Blood Culture results may not be optimal due to an inadequate volume of blood received in culture bottles   Culture   Final    NO GROWTH 3 DAYS Performed at [facility], [address], [place], [place] [address]    Report Status PENDING  Incomplete  Blood Cultures x 2 sites     Status: None (Preliminary result)   Collection Time: 06/13/23  3:15 PM   Specimen: BLOOD RIGHT HAND  Result Value Ref Range Status   Specimen Description BLOOD RIGHT HAND  Final   Special Requests   Final    BOTTLES DRAWN AEROBIC AND ANAEROBIC Blood Culture results may not be optimal due to an inadequate volume of blood received in culture bottles   Culture   Final    NO GROWTH 2 DAYS Performed at [facility], [address], [place], [place] [address]    Report Status PENDING  Incomplete      Radiology Studies: No results found.     LOS: 3 days     Jacquelin Hawking, MD [facility] 06/16/2023, 8:57 AM   If 7PM-7AM, please contact night-coverage [email or web address]

## 2023-06-16 NOTE — Progress Notes (Signed)
Orthopedic Tech Progress Note Patient Details:  Joy Tapia [birth year] [id number]  Ortho Devices Type of Ortho Device: Postop shoe/boot Ortho Device/Splint Location: RLE Ortho Device/Splint Interventions: Ordered, Application, Adjustment, Removal   Post Interventions Patient Tolerated: Well Instructions Provided: Care of device  Donald Pore 06/16/2023, 2:31 PM

## 2023-06-16 NOTE — Progress Notes (Signed)
History and Physical Interval Note:  06/16/2023 10:04 AM  Joy Tapia  has presented today for surgery, with the diagnosis of ulcer left foot and osteomyelitis right foot with prior great toe amputation..  The various methods of treatment have been discussed with the patient and family. After consideration of risks, benefits and other options for treatment, the patient has consented to   Procedure(s) with comments: TRANSMETATARSAL AMPUTATION (Right) - Right foot TMA, possible TAL IRRIGATION AND DEBRIDEMENT WOUND (Left) - Wound debridement, possible bone biopsy left as a surgical intervention.  The patient's history has been reviewed, patient examined, no change in status, stable for surgery.  I have reviewed the patient's chart and labs.  Questions were answered to the patient's satisfaction.     Jenelle Mages [name]

## 2023-06-16 NOTE — Anesthesia Procedure Notes (Signed)
Procedure Name: Intubation Date/Time: 06/16/2023 11:06 AM  Performed by: Sudie Grumbling, CRNAPre-anesthesia Checklist: Patient identified, Emergency Drugs available, Suction available and Patient being monitored Patient Re-evaluated:Patient Re-evaluated prior to induction Oxygen Delivery Method: Circle system utilized Preoxygenation: Pre-oxygenation with 100% oxygen Induction Type: IV induction Ventilation: Mask ventilation without difficulty Laryngoscope Size: Mac and 3 Grade View: Grade I Tube type: Oral Tube size: 6.5 mm Number of attempts: 1 (LMA 3 exchanged for LMA 4 exchanged for 6.5 ETT) Airway Equipment and Method: Stylet Placement Confirmation: ETT inserted through vocal cords under direct vision, positive ETCO2 and breath sounds checked- equal and bilateral Secured at: 22 cm Tube secured with: Tape Dental Injury: Teeth and Oropharynx as per pre-operative assessment  Comments: Initial airway attempt with LMA 3, encountered problems with leak around the LMA, exchanged for LMA 4, ventilation suboptimal. Initial attempt with MAC3 6.5 ETT successful, Grade I view, BLBS once tube secured, placed on ventilator.

## 2023-06-17 ENCOUNTER — Encounter (HOSPITAL_COMMUNITY): Payer: Self-pay | Admitting: Podiatry

## 2023-06-17 DIAGNOSIS — L089 Local infection of the skin and subcutaneous tissue, unspecified: Secondary | ICD-10-CM | POA: Diagnosis not present

## 2023-06-17 DIAGNOSIS — E11628 Type 2 diabetes mellitus with other skin complications: Secondary | ICD-10-CM | POA: Diagnosis not present

## 2023-06-17 LAB — GLUCOSE, CAPILLARY
Glucose-Capillary: 146 mg/dL — ABNORMAL HIGH (ref 70–99)
Glucose-Capillary: 152 mg/dL — ABNORMAL HIGH (ref 70–99)
Glucose-Capillary: 220 mg/dL — ABNORMAL HIGH (ref 70–99)
Glucose-Capillary: 152 mg/dL — ABNORMAL HIGH (ref 70–99)

## 2023-06-17 LAB — CREATININE, SERUM
Creatinine, Ser: 1.59 mg/dL — ABNORMAL HIGH (ref 0.44–1.00)
GFR, Estimated: 32 mL/min — ABNORMAL LOW (ref >60)

## 2023-06-17 LAB — CBC
HCT: 27.8 % — ABNORMAL LOW (ref 36.0–46.0)
Hemoglobin: 9.1 g/dL — ABNORMAL LOW (ref 12.0–15.0)
MCH: 28.3 pg (ref 26.0–34.0)
MCHC: 32.7 g/dL (ref 30.0–36.0)
MCV: 86.6 fL (ref 80.0–100.0)
Platelets: 241 10*3/uL (ref 150–400)
RBC: 3.21 MIL/uL — ABNORMAL LOW (ref 3.87–5.11)
RDW: 15.9 % — ABNORMAL HIGH (ref 11.5–15.5)
WBC: 13.1 10*3/uL — ABNORMAL HIGH (ref 4.0–10.5)
nRBC: 0 % (ref 0.0–0.2)

## 2023-06-17 LAB — CULTURE, BLOOD (ROUTINE X 2): Culture: NO GROWTH

## 2023-06-17 MED ORDER — FENTANYL CITRATE PF 50 MCG/ML IJ SOSY: 12.5000 ug | PREFILLED_SYRINGE | INTRAMUSCULAR | Status: DC | PRN

## 2023-06-17 MED ORDER — OXYCODONE HCL 5 MG PO TABS
5.0000 mg | ORAL_TABLET | Freq: Four times a day (QID) | ORAL | Status: DC | PRN
  Administered 2023-06-17 – 2023-06-18 (×2): 5 mg via ORAL
  Filled 2023-06-17 (×2): qty 1

## 2023-06-17 MED ORDER — NALOXONE HCL 0.4 MG/ML IJ SOLN: 0.4000 mg | INTRAMUSCULAR | Status: DC | PRN

## 2023-06-17 MED ORDER — INSULIN ASPART 100 UNIT/ML IJ SOLN
0.0000 [IU] | Freq: Three times a day (TID) | INTRAMUSCULAR | Status: DC
  Administered 2023-06-17: 3 [IU] via SUBCUTANEOUS
  Administered 2023-06-17: 1 [IU] via SUBCUTANEOUS
  Administered 2023-06-17 – 2023-06-18 (×2): 2 [IU] via SUBCUTANEOUS
  Administered 2023-06-18: 3 [IU] via SUBCUTANEOUS
  Administered 2023-06-18: 1 [IU] via SUBCUTANEOUS
  Administered 2023-06-19 (×2): 2 [IU] via SUBCUTANEOUS

## 2023-06-17 MED ORDER — GUAIFENESIN ER 600 MG PO TB12
600.0000 mg | ORAL_TABLET | Freq: Two times a day (BID) | ORAL | Status: DC | PRN
  Administered 2023-06-17: 600 mg via ORAL
  Filled 2023-06-17: qty 1

## 2023-06-17 MED ORDER — OXYCODONE HCL 5 MG PO TABS: 5.0000 mg | ORAL_TABLET | Freq: Four times a day (QID) | ORAL | Status: DC | PRN

## 2023-06-17 NOTE — Progress Notes (Signed)
Mobility Specialist: Progress Note   06/17/23 1133  Mobility  Activity Ambulated with assistance in hallway  Level of Assistance Standby assist, set-up cues, supervision of patient - no hands on  Assistive Device Front wheel walker  Distance Ambulated (ft) 50 ft  Activity Response Tolerated well  Mobility Referral Yes  Mobility visit 1 Mobility  Mobility Specialist Start Time (ACUTE ONLY) 0920  Mobility Specialist Stop Time (ACUTE ONLY) 0930  Mobility Specialist Time Calculation (min) (ACUTE ONLY) 10 min    Pt was agreeable to mobility session - received on EOB. Ambulated under SV with post op shoe - WBAT. C/o pain on the back of the R foot/ankle. Returned to room without fault. Left on EOB with all needs met, call bell in reach.   Maurene Capes Mobility Specialist Please contact via SecureChat or Rehab office at [phone]

## 2023-06-17 NOTE — Inpatient Diabetes Management (Signed)
Inpatient Diabetes Program Recommendations  AACE/ADA: New Consensus Statement on Inpatient Glycemic Control (2015)  Target Ranges:  Prepandial:   less than 140 mg/dL      Peak postprandial:   less than 180 mg/dL (1-2 hours)      Critically ill patients:  140 - 180 mg/dL   Lab Results  Component Value Date   GLUCAP 146 (H) 06/17/2023   HGBA1C 9.2 (H) 06/13/2023    Latest Reference Range & Units 06/16/23 08:05 06/16/23 10:46 06/16/23 12:13 06/16/23 12:53 06/16/23 16:28 06/16/23 20:53 06/17/23 08:03  Glucose-Capillary 70 - 99 mg/dL 161 (H) 096 (H) 045 (H) 192 (H) 378 (H) 380 (H) 146 (H)  (H): Data is abnormally high  Diabetes history: DM2 Outpatient Diabetes medications: Lev 12 units daily, Amaryl 5 mg daily  Current orders for Inpatient glycemic control: Novolog 0-9 units tid correction  Inpatient Diabetes Program Recommendations:   Noted patient received Decadron 10 mg x 1 prior to surgery. Please consider: -Add Semglee 6 units daily  Thank you, Billy Fischer. Anaiah Mcmannis, RN, MSN, CDCES  Diabetes Coordinator Inpatient Glycemic Control Team Team Pager [phone] (8am-5pm) 06/17/2023 10:24 AM

## 2023-06-17 NOTE — Care Management Important Message (Signed)
Important Message  Patient Details  Name: Joy Tapia MRN: [id number] Date of Birth: 1941-01-30   Important Message Given:  Yes - Medicare IM     Dorena Bodo 06/17/2023, 3:30 PM

## 2023-06-17 NOTE — Progress Notes (Signed)
PROGRESS NOTE    Joy Tapia  OZH:[id number] DOB: December 18, 1940 DOA: 06/12/2023 PCP: System, Provider Not In   Brief Narrative: Joy Tapia is a 83 y.o. female with a history of diabetes mellitus type 2, hypertension, CKD stage IV, hyperlipidemia, mild cognitive impairment.  Patient presented secondary to a progressively worsening wound infection with evidence of osteomyelitis requiring amputation.  Empiric antibiotics started.  Podiatry consulted for management.   Assessment/Plan:  Diabetic right foot infection Osteomyelitis of right foot Worsening right foot ulcer with evidence of osteomyelitis on x-ray, confirmed on MRI affecting the second metatarsal and third proximal phalanx. Associated bone necrosis. Podiatry consulted with plan for amputation. ABIs ordered and are significant for normal right side and abnormal left TBI. Empiric antibiotics started. Right TMA performed on 1/20. -Discontinue Vancomycin, Ceftriaxone and Flagyl tomorrow and transition to oral antibiotics per podiatry recommendations (7 days PO on discharge); likely transition to Augmentin -Follow-up podiatry recommendations: WBAT in a post op shoe to left foot and NWB to right foot; pending today  Left foot ulcer S/p excision debridement with placement of amniotic graft on 1/20.  Primary hypertension -Continue Toprol XL  CKD stage IV Stable.  Diabetes mellitus type 2 Seems to be well controlled using last hemoglobin A1C of 7.4% from 06/2022. Patient is on Levemir and glimepiride as an outpatient. Patient started on Semglee and SSI on admission. -Continue Semglee and SSI -Will likely discontinue glimepiride on discharge  Mild cognitive impairment Noted. Patient states that she has a healthcare power of attorney, but that she is not her daughter, as is documented in chart, but rather a friend.  Hyperlipidemia -Continue pravastatin   DVT prophylaxis: Lovenox Code Status:   Code Status: Full  Code Family Communication: None at bedside Disposition Plan: Discharge pending PT/OT recommendations.   Consultants:  Podiatry  Procedures:  Podiatry surgery Excisional debridement of ulceration to sub cutaneous fat level with prep for graft 1x1x0.2 cm, left Application amniotic graft 1.5 x 1.5 x 0.3 cm, left Transmetatarsal amputation of right foot Tendo achilles lengthening of right foot  Antimicrobials: Vancomycin Ceftriaxone Flagyl    Subjective: Some feeling like she has an asthma exacerbation. No other concerns.  Objective: BP (!) 141/61 (BP Location: Right Arm)   Pulse 79   Temp 97.9 F (36.6 C) (Oral)   Resp 15   Ht 4\' 11"  (1.499 m)   Wt 54.4 kg   SpO2 100%   BMI 24.24 kg/m   Examination:  General exam: Appears calm and comfortable Respiratory system: Clear to auscultation; no wheezing. Respiratory effort normal. Cardiovascular system: S1 & S2 heard, RRR. No murmurs. Gastrointestinal system: Abdomen is nondistended, soft and nontender.  Normal bowel sounds heard. Central nervous system: Alert and oriented. No focal neurological deficits. Musculoskeletal: Bilateral feet in surgical dressing Psychiatry: Judgement and insight appear normal. Mood & affect appropriate.    Data Reviewed: I have personally reviewed following labs and imaging studies   Last CBC Lab Results  Component Value Date   WBC 8.9 06/15/2023   HGB 11.8 (L) 06/15/2023   HCT 36.3 06/15/2023   MCV 86.2 06/15/2023   MCH 28.0 06/15/2023   RDW 15.8 (H) 06/15/2023   PLT 294 06/15/2023     Last metabolic panel Lab Results  Component Value Date   GLUCOSE 162 (H) 06/16/2023   NA 139 06/16/2023   K 3.7 06/16/2023   CL 105 06/16/2023   CO2 23 06/16/2023   BUN 18 06/16/2023   CREATININE 1.59 (H) 06/17/2023  GFRNONAA 32 (L) 06/17/2023   CALCIUM 8.3 (L) 06/16/2023   PROT 6.2 (L) 06/12/2023   ALBUMIN 2.9 (L) 06/12/2023   BILITOT 0.8 06/12/2023   ALKPHOS 95 06/12/2023   AST 45 (H)  06/12/2023   ALT 27 06/12/2023   ANIONGAP 11 06/16/2023     Creatinine Clearance: Estimated Creatinine Clearance: 20.9 mL/min (A) (by C-G formula based on SCr of 1.59 mg/dL (H)).  Recent Results (from the past 240 hours)  WOUND CULTURE     Status: Abnormal   Collection Time: 06/12/23 12:00 PM   Specimen: Foot, Right; Wound  Result Value Ref Range Status   MICRO NUMBER: [id number]  Final   SPECIMEN QUALITY: Adequate  Final   SOURCE: NOT GIVEN  Final   STATUS: FINAL  Final   GRAM STAIN:   Final    No white blood cells seen No epithelial cells seen Few Gram negative bacilli Few Gram positive cocci in pairs   ISOLATE 1: Pseudomonas aeruginosa (A)  Final    Comment: Light growth of Pseudomonas aeruginosa   COMMENT:   Final    No source was provided. The specimen was tested and reported based upon the test code ordered. If this is incorrect, please contact client services.      Susceptibility   Pseudomonas aeruginosa - AEROBIC CULT, GRAM STAIN NEGATIVE 1    CEFTAZIDIME 2 Sensitive     CEFEPIME <=1 Sensitive     CIPROFLOXACIN 0.5 Intermediate     LEVOFLOXACIN >=8 Resistant     GENTAMICIN <=1 Sensitive     IMIPENEM 2 Sensitive     PIP/TAZO 16 Sensitive     TOBRAMYCIN* <=1 Sensitive      * Legend: S = Susceptible  I = Intermediate R = Resistant  NS = Not susceptible SDD = Susceptible Dose Dependent * = Not Tested  NR = Not Reported **NN = See Therapy Comments   Blood Cultures x 2 sites     Status: None (Preliminary result)   Collection Time: 06/12/23  4:56 PM   Specimen: BLOOD  Result Value Ref Range Status   Specimen Description BLOOD SITE NOT SPECIFIED  Final   Special Requests   Final    BOTTLES DRAWN AEROBIC ONLY Blood Culture results may not be optimal due to an inadequate volume of blood received in culture bottles   Culture   Final    NO GROWTH 4 DAYS Performed at [facility], [address] [place], [place] [address]    Report Status PENDING  Incomplete  Blood  Cultures x 2 sites     Status: None (Preliminary result)   Collection Time: 06/13/23  3:15 PM   Specimen: BLOOD RIGHT HAND  Result Value Ref Range Status   Specimen Description BLOOD RIGHT HAND  Final   Special Requests   Final    BOTTLES DRAWN AEROBIC AND ANAEROBIC Blood Culture results may not be optimal due to an inadequate volume of blood received in culture bottles   Culture   Final    NO GROWTH 3 DAYS Performed at [facility], [address], [place], [place] [address]    Report Status PENDING  Incomplete  Aerobic/Anaerobic Culture w Gram Stain (surgical/deep wound)     Status: None (Preliminary result)   Collection Time: 06/16/23 11:07 AM   Specimen: PATH Benign ortho; Tissue  Result Value Ref Range Status   Specimen Description TISSUE  Final   Special Requests RIGHT 2ND METATARSAL  Final  Gram Stain   Final    NO WBC SEEN NO ORGANISMS SEEN Performed at [facility], [address], [place], [place] [address]    Culture PENDING  Incomplete   Report Status PENDING  Incomplete  Aerobic/Anaerobic Culture w Gram Stain (surgical/deep wound)     Status: None (Preliminary result)   Collection Time: 06/16/23 11:21 AM   Specimen: Path fluid; Body Fluid  Result Value Ref Range Status   Specimen Description WOUND  Final   Special Requests LEFT FOOT ULCER  Final   Gram Stain   Final    NO WBC SEEN NO ORGANISMS SEEN Performed at [facility], [address], [place], [place] [address]    Culture PENDING  Incomplete   Report Status PENDING  Incomplete      Radiology Studies: DG Foot 2 Views Right Result Date: 06/16/2023 CLINICAL DATA:  Postop EXAM: RIGHT FOOT - 2 VIEW COMPARISON:  04/26/2023, MRI 06/12/2023 FINDINGS: Interval transmetatarsal amputation of the first through fifth digits. Gas in the soft tissues consistent with recent surgery. Smooth cut margins. Moderate plantar calcaneal spur. Clips posterior to the ankle. Possible ulceration along the anterior  aspect of the lower leg and ankle IMPRESSION: Interval transmetatarsal amputation of the first through fifth digits. Electronically Signed   By: Jasmine Pang M.D.   On: 06/16/2023 16:57       LOS: 4 days    Jacquelin Hawking, MD [facility] 06/17/2023, 8:44 AM   If 7PM-7AM, please contact night-coverage [email or web address]

## 2023-06-17 NOTE — Progress Notes (Signed)
  Subjective:  Patient ID: Joy Tapia, female    DOB: [birth year],  MRN: [id number]  Chief Complaint  Patient presents with   Wound Infection    DOS: 06/16/2023 Procedure: 1. Excisional debridement of ulceration to sub cutaneous fat level with prep for graft 1x1x0.2 cm, left 2. Application amniotic graft 1.5 x 1.5 x 0.3 cm, left   3. Transmetatarsal amputation of right foot 4. Tendo achilles lengthening of right foot  83 y.o. female seen for post op check.  She reports she is doing well she has no pain in either foot at this time.  She is walking with a postop shoe on the left foot and has been putting some weight down against recommendations on the transmetatarsal amputation site on the right side to get to the bathroom.  She does not yet have a cam boot in the room.  Review of Systems: Negative except as noted in the HPI. Denies N/V/F/Ch.   Objective:   Vitals:   06/17/23 0418 06/17/23 0802  BP: (!) 152/63 (!) 141/61  Pulse: 80 79  Resp:    Temp: 97.8 F (36.6 C) 97.9 F (36.6 C)  SpO2: 99% 100%   Body mass index is 24.24 kg/m. Constitutional Well developed. Well nourished.  Vascular Foot warm and well perfused. Capillary refill normal to all digits.   No calf pain with palpation  Neurologic Normal speech. Oriented to person, place, and time. Epicritic sensation absent to right foot  Dermatologic Dressings clean dry and intact bilaterally  Orthopedic: Status post right foot TMA   Radiographs: Interval transmetatarsal amputation of the right foot  Pathology: Pending  Micro: Bilateral foot pending  Assessment:   1. Cellulitis of right lower extremity   2. Osteomyelitis of right foot, unspecified type [facility])    Plan:  Patient was evaluated and treated and all questions answered.  POD # 1 s/p right foot transmetatarsal amputation and tendo Achilles lengthening and left foot ulcer debridement and graft application -Progressing as expected postoperatively  denies pain.  Seems to be moving well. -Patient requires cam boot for the right lower extremity.  Reinforced need for nonweightbearing to right foot.  This was ordered yesterday unclear why patient does not yet have it.  Wear postop shoe on the left foot.  She brought postop shoe from home. -PT OT today -XR: Expected postop changes -WB Status: Nonweightbearing in cam boot to right foot and weightbearing as tolerated to left foot in postop shoe -Sutures: To remain intact 2 to 3 weeks. -Medications/ABX: Recommend 7 to 10 days Augmentin or per culture on discharge -Dressings to remain clean dry and intact for 1 week. - Return to clinic 1 week from today in [place] location.  Will sign off at this time please epic chat with any questions or concerns        Corinna Gab, DPM [facility] / [facility]

## 2023-06-17 NOTE — Plan of Care (Signed)

## 2023-06-17 NOTE — Progress Notes (Signed)
Pharmacy Antibiotic Note  Joy Tapia is a 83 y.o. female admitted on 06/12/2023 with bilateral foot ulceration and concerns for foot infection.  Pharmacy has been consulted for vancomycin dosing.  Currently on Day # 5 Vancomycin, Metronidazole + Ceftriaxone  s/p Right foot metatarsal amputaion , and left foot ulcer I&D, graft application on 06/16/23. -WBC  had trended down to 8.9, now back up to 13.1 today. Afebrile  SCr  down to 1.59.  (~baseline)  I recalculated vancomycin dose today 1/21 using SCr 1.59> Current Vancomycin dose 750mg  IV q48hr remains appropriate.    Plan: -Continue Vancomycin 750 q48h (AUC 476, Scr 1.59) next due 1/22 PM MD noting that will likely to transition to PO ABX tomorrow.  Follow up Cx's , renal function,  Vanc levels as needed per protocol  Height: 4\' 11"  (149.9 cm) Weight: 54.4 kg (120 lb) IBW/kg (Calculated) : 43.2  Temp (24hrs), Avg:98 F (36.7 C), Min:97.8 F (36.6 C), Max:98.2 F (36.8 C)  Recent Labs  Lab 06/12/23 1754 06/12/23 1756 06/13/23 0603 06/15/23 0634 06/16/23 0557 06/17/23 0538  WBC  --  13.1* 9.5 8.9  --  13.1*  CREATININE  --  1.69* 1.56* 2.06* 1.63* 1.59*  LATICACIDVEN 1.5  --   --   --   --   --     Estimated Creatinine Clearance: 20.9 mL/min (A) (by C-G formula based on SCr of 1.59 mg/dL (H)).    Antimicrobials this admission: Ceftriaxone 1/17 >>  Flagyl 1/17 >>  Vancomycin 1/17 >>   Microbiology results: 1/16 BCx: negative 1/16 Wound Rt foot:  Pseudomonas aeruginosa:  PAN sens except R to LVQ, Intermed to cipro.   1/17 BCx: ngtd x4d 1/20 tissue cx R 2nd metatarsal cx:  ngtd  1/20 left foot ulcer wound cx:  pending, reincubated   Thank you for allowing pharmacy to be a part of this patient's care.  Noah Delaine, RPh Clinical Pharmacist 06/17/2023 4:15 PM

## 2023-06-18 DIAGNOSIS — L089 Local infection of the skin and subcutaneous tissue, unspecified: Secondary | ICD-10-CM | POA: Diagnosis not present

## 2023-06-18 DIAGNOSIS — L03115 Cellulitis of right lower limb: Secondary | ICD-10-CM | POA: Diagnosis not present

## 2023-06-18 DIAGNOSIS — E11628 Type 2 diabetes mellitus with other skin complications: Secondary | ICD-10-CM | POA: Diagnosis not present

## 2023-06-18 DIAGNOSIS — M869 Osteomyelitis, unspecified: Secondary | ICD-10-CM | POA: Diagnosis not present

## 2023-06-18 LAB — GLUCOSE, CAPILLARY
Glucose-Capillary: 145 mg/dL — ABNORMAL HIGH (ref 70–99)
Glucose-Capillary: 166 mg/dL — ABNORMAL HIGH (ref 70–99)
Glucose-Capillary: 208 mg/dL — ABNORMAL HIGH (ref 70–99)
Glucose-Capillary: 214 mg/dL — ABNORMAL HIGH (ref 70–99)
Glucose-Capillary: 233 mg/dL — ABNORMAL HIGH (ref 70–99)

## 2023-06-18 LAB — CULTURE, BLOOD (ROUTINE X 2): Culture: NO GROWTH

## 2023-06-18 LAB — BASIC METABOLIC PANEL
Anion gap: 10 (ref 5–15)
BUN: 12 mg/dL (ref 8–23)
CO2: 22 mmol/L (ref 22–32)
Calcium: 8 mg/dL — ABNORMAL LOW (ref 8.9–10.3)
Chloride: 104 mmol/L (ref 98–111)
Creatinine, Ser: 1.37 mg/dL — ABNORMAL HIGH (ref 0.44–1.00)
GFR, Estimated: 39 mL/min — ABNORMAL LOW (ref >60)
Glucose, Bld: 172 mg/dL — ABNORMAL HIGH (ref 70–99)
Potassium: 3.4 mmol/L — ABNORMAL LOW (ref 3.5–5.1)
Sodium: 136 mmol/L (ref 135–145)

## 2023-06-18 LAB — CBC
HCT: 30.1 % — ABNORMAL LOW (ref 36.0–46.0)
Hemoglobin: 9.8 g/dL — ABNORMAL LOW (ref 12.0–15.0)
MCH: 28.6 pg (ref 26.0–34.0)
MCHC: 32.6 g/dL (ref 30.0–36.0)
MCV: 87.8 fL (ref 80.0–100.0)
Platelets: 240 10*3/uL (ref 150–400)
RBC: 3.43 MIL/uL — ABNORMAL LOW (ref 3.87–5.11)
RDW: 16.2 % — ABNORMAL HIGH (ref 11.5–15.5)
WBC: 12.7 10*3/uL — ABNORMAL HIGH (ref 4.0–10.5)
nRBC: 0 % (ref 0.0–0.2)

## 2023-06-18 LAB — SURGICAL PATHOLOGY

## 2023-06-18 NOTE — Progress Notes (Signed)
  Progress Note   Patient: Joy Tapia ZOX:[id number] DOB: 10-Apr-1941 DOA: 06/12/2023     5 DOS: the patient was seen and examined on 06/18/2023   Brief hospital course: 83 y.o. female with a history of diabetes mellitus type 2, hypertension, CKD stage IV, hyperlipidemia, mild cognitive impairment.  Patient presented secondary to a progressively worsening wound infection with evidence of osteomyelitis requiring amputation.  Empiric antibiotics started.  Podiatry consulted for management.   Assessment and Plan: Diabetic right foot infection Osteomyelitis of right foot Worsening right foot ulcer with evidence of osteomyelitis on x-ray, confirmed on MRI affecting the second metatarsal and third proximal phalanx. Associated bone necrosis. Podiatry consulted with plan for amputation. ABIs ordered and are significant for normal right side and abnormal left TBI. Empiric antibiotics started. Right TMA performed on 1/20. -Discontinue Vancomycin, Ceftriaxone and Flagyl tomorrow and transition to oral antibiotics per podiatry recommendations (7 days PO on discharge); likely transition to Augmentin on d/c -Podiatry recommendations for WBAT in a post op shoe to left foot and NWB to right foot; pending today   Left foot ulcer S/p excision debridement with placement of amniotic graft on 1/20.   Primary hypertension -Continue Toprol XL   CKD stage IV Stable.   Diabetes mellitus type 2 Seems to be well controlled using last hemoglobin A1C of 7.4% from 06/2022. Patient is on Levemir and glimepiride as an outpatient. Patient started on Semglee and SSI on admission. -Continue Semglee and SSI -Will likely discontinue glimepiride on discharge   Mild cognitive impairment Noted. Patient states that she has a healthcare power of attorney, but that she is not her daughter, as is documented in chart, but rather a friend.   Hyperlipidemia -Continue pravastatin         Subjective: Without  complaints  Physical Exam: Vitals:   06/18/23 0807 06/18/23 0834 06/18/23 0902 06/18/23 1634  BP: (!) 158/76   (!) 121/44  Pulse: 71   60  Resp:  16 18   Temp: 98.7 F (37.1 C)   98.7 F (37.1 C)  TempSrc: Oral   Oral  SpO2: 97%   100%  Weight:      Height:       General exam: Awake, laying in bed, in nad Respiratory system: Normal respiratory effort, no wheezing Cardiovascular system: regular rate, s1, s2 Gastrointestinal system: Soft, nondistended, positive BS Central nervous system: CN2-12 grossly intact, strength intact Extremities: Perfused, no clubbing Skin: Normal skin turgor, no notable skin lesions seen Psychiatry: Mood normal // no visual hallucinations   Data Reviewed:  Labs reviewed: na 136, K 3.4, Cr 1.37, WBC 12.7, Hgb 9.8, Plts 240  Family Communication: Pt in room, family not at bedside  Disposition: Status is: Inpatient Remains inpatient appropriate because: severity of illness  Planned Discharge Destination: Skilled nursing facility    Author: Rickey Barbara, MD 06/18/2023 5:25 PM  For on call review [email or web address].

## 2023-06-18 NOTE — Plan of Care (Signed)
  Problem: Education: Goal: Ability to describe self-care measures that may prevent or decrease complications (Diabetes Survival Skills Education) will improve Outcome: Progressing Goal: Individualized Educational Video(s) Outcome: Progressing   Problem: Coping: Goal: Ability to adjust to condition or change in health will improve Outcome: Progressing   Problem: Health Behavior/Discharge Planning: Goal: Ability to identify and utilize available resources and services will improve Outcome: Progressing Goal: Ability to manage health-related needs will improve Outcome: Progressing   Problem: Metabolic: Goal: Ability to maintain appropriate glucose levels will improve Outcome: Progressing   Problem: Skin Integrity: Goal: Risk for impaired skin integrity will decrease Outcome: Progressing   Problem: Nutritional: Goal: Maintenance of adequate nutrition will improve Outcome: Progressing Goal: Progress toward achieving an optimal weight will improve Outcome: Progressing   Problem: Tissue Perfusion: Goal: Adequacy of tissue perfusion will improve Outcome: Progressing   Problem: Education: Goal: Knowledge of General Education information will improve Description: Including pain rating scale, medication(s)/side effects and non-pharmacologic comfort measures Outcome: Progressing   Problem: Elimination: Goal: Will not experience complications related to bowel motility Outcome: Progressing Goal: Will not experience complications related to urinary retention Outcome: Progressing   Problem: Pain Managment: Goal: General experience of comfort will improve and/or be controlled Outcome: Progressing   Problem: Safety: Goal: Ability to remain free from injury will improve Outcome: Progressing

## 2023-06-18 NOTE — TOC Progression Note (Signed)
Transition of Care [facility]) - Progression Note    Patient Details  Name: Joy Tapia MRN: [id number] Date of Birth: Feb 18, 1941  [facility] CM/SW Contact  Joy Tapia, Joy Tapia Phone Number: 06/18/2023, 3:42 PM  Clinical Narrative:     CSW reached out to [facility] to determine if pt can return. CSW spoke with CMA who is going to talk with pt's nurse and reach back out to CSW. RNCM notified and stated that pt can go back with [facility] if needed for services. CSW also reached out to pt's POA Joy Tapia to update on possible return to [facility], she stated she assists with transportation and asked to be kept updated when pt does discharge.    TOC will continue to follow.        Expected Discharge Plan and Services                                               Social Determinants of Health (SDOH) Interventions SDOH Screenings   Food Insecurity: No Food Insecurity (06/13/2023)  Housing: Low Risk  (06/13/2023)  Transportation Needs: No Transportation Needs (06/13/2023)  Utilities: Not At Risk (06/13/2023)  Social Connections: Moderately Isolated (06/13/2023)  Tobacco Use: Medium Risk (06/16/2023)    Readmission Risk Interventions    04/28/2023    3:08 PM  Readmission Risk Prevention Plan  Transportation Screening Complete  PCP or Specialist Appt within 5-7 Days Complete  Home Care Screening Complete  Medication Review (RN CM) Complete

## 2023-06-18 NOTE — Evaluation (Signed)
Occupational Therapy Evaluation Patient Details Name: Joy Tapia MRN: [id number] DOB: January 05, 1941 Today's Date: 06/18/2023   History of Present Illness 83 yo female presented from podiatrist's office on 1/17 with concern for osteomyelitis in R foot. Underwent R TMA and L foot I & D on 1/20 PMH: DVT, gout, hyperlipidemia, hypertension, RLE great toe amputation, mild cognitive impairment, CKD 4, OA, osteoporosis, DM2.   Clinical Impression   Pt is a poor historian, unable to report where she lived prior to admission, only that she sold her house and is supposed to move to a condo. Per chart, pt is from ALF. Pt unaware of NWB status of R LE, initially ambulating reportedly with weight through heel. Educated pt in hopping with RW, requires CGA. She has used a knee scooter in the past and reports her POA has ordered one for her. Pt needs set up to CGA for ADLs. Will follow to reinforce pt WB precautions during ADLs and ADL transfers. Recommending HHOT at ALF.       If plan is discharge home, recommend the following: A little help with walking and/or transfers;A little help with bathing/dressing/bathroom;Help with stairs or ramp for entrance;Assistance with cooking/housework;Direct supervision/assist for medications management;Direct supervision/assist for financial management;Assist for transportation    Functional Status Assessment  Patient has had a recent decline in their functional status and demonstrates the ability to make significant improvements in function in a reasonable and predictable amount of time.  Equipment Recommendations  None recommended by OT    Recommendations for Other Services       Precautions / Restrictions Precautions Precautions: Fall Required Braces or Orthoses: Other Brace Other Brace: R CAM boot ordered, L post op shoe Restrictions Weight Bearing Restrictions Per Provider Order: Yes LLE Weight Bearing Per Provider Order: Weight bearing as tolerated (in  post op shoe)      Mobility Bed Mobility               General bed mobility comments: seated at EOB    Transfers Overall transfer level: Needs assistance Equipment used: Rolling walker (2 wheels) Transfers: Sit to/from Stand Sit to Stand: Supervision           General transfer comment: needs constant cues to avoid weight through R foot      Balance Overall balance assessment: Needs assistance   Sitting balance-Leahy Scale: Normal       Standing balance-Leahy Scale: Poor Standing balance comment: reliant on B UE support                           ADL either performed or assessed with clinical judgement   ADL Overall ADL's : Needs assistance/impaired Eating/Feeding: Independent   Grooming: Wash/dry hands;Standing;Contact guard assist   Upper Body Bathing: Supervision/ safety;Sitting   Lower Body Bathing: Contact guard assist;Sit to/from stand;Sitting/lateral leans   Upper Body Dressing : Set up;Sitting   Lower Body Dressing: Supervision/safety;Sitting/lateral leans;Sit to/from stand   Toilet Transfer: Contact guard assist;Ambulation;Regular Toilet;Rolling walker (2 wheels)   Toileting- Clothing Manipulation and Hygiene: Set up;Sitting/lateral lean       Functional mobility during ADLs: Contact guard assist;Rolling walker (2 wheels)       Vision Ability to See in Adequate Light: 0 Adequate Patient Visual Report: No change from baseline       Perception         Praxis         Pertinent Vitals/Pain Pain Assessment Pain Assessment: Faces  Faces Pain Scale: No hurt Pain Intervention(s): Monitored during session     Extremity/Trunk Assessment Upper Extremity Assessment Upper Extremity Assessment: Overall WFL for tasks assessed   Lower Extremity Assessment Lower Extremity Assessment: Defer to PT evaluation       Communication Communication Communication: No apparent difficulties   Cognition Arousal: Alert Behavior During  Therapy: Impulsive Overall Cognitive Status: History of cognitive impairments - at baseline                                 General Comments: poor historian     General Comments       Exercises     Shoulder Instructions      Home Living Family/patient expects to be discharged to:: Assisted living                             Home Equipment: Rolling Walker (2 wheels);Cane - single point;Crutches;Shower seat;Other (comment);Hand held shower head (reports a knee walker has been ordered by her POA)          Prior Functioning/Environment Prior Level of Function : Needs assist             Mobility Comments: has a RW, reports "they want me to use it" ADLs Comments: sits to shower, independent        OT Problem List: Impaired balance (sitting and/or standing);Decreased cognition;Decreased knowledge of use of DME or AE      OT Treatment/Interventions: Self-care/ADL training;DME and/or AE instruction;Therapeutic activities;Patient/family education;Balance training    OT Goals(Current goals can be found in the care plan section) Acute Rehab OT Goals OT Goal Formulation: With patient Time For Goal Achievement: 07/02/23 Potential to Achieve Goals: Good  OT Frequency: Min 1X/week    Co-evaluation              AM-PAC OT "6 Clicks" Daily Activity     Outcome Measure Help from another person eating meals?: None Help from another person taking care of personal grooming?: A Little Help from another person toileting, which includes using toliet, bedpan, or urinal?: A Little Help from another person bathing (including washing, rinsing, drying)?: A Little Help from another person to put on and taking off regular upper body clothing?: A Little Help from another person to put on and taking off regular lower body clothing?: A Little 6 Click Score: 19   End of Session Equipment Utilized During Treatment: Gait belt;Rolling walker (2 wheels);Other  (comment) (B post op shoes) Nurse Communication: Weight bearing status  Activity Tolerance: Patient tolerated treatment well Patient left: in bed;with call bell/phone within reach;Other (comment) (with PT)  OT Visit Diagnosis: Other symptoms and signs involving cognitive function;Other abnormalities of gait and mobility (R26.89)                Time: 6045-4098 OT Time Calculation (min): 25 min Charges:  OT General Charges $OT Visit: 1 Visit OT Evaluation $OT Eval Low Complexity: 1 Low $OT Eval Moderate Complexity: 1 Mod OT Treatments $Self Care/Home Management : 8-22 mins  Berna Spare, OTR/L Acute Rehabilitation Services Office: [phone]  Evern Bio 06/18/2023, 2:06 PM

## 2023-06-18 NOTE — Hospital Course (Signed)
83 y.o. female with a history of diabetes mellitus type 2, hypertension, CKD stage IV, hyperlipidemia, mild cognitive impairment.  Patient presented secondary to a progressively worsening wound infection with evidence of osteomyelitis requiring amputation.  Empiric antibiotics started.  Podiatry consulted for management.

## 2023-06-18 NOTE — Evaluation (Addendum)
Physical Therapy Evaluation Patient Details Name: Joy Tapia MRN: [id number] DOB: 1940-06-18 Today's Date: 06/18/2023  History of Present Illness  Pt is an 83 y/o F admitted on 06/12/23 after presenting with c/o worsening wound infection with evidence of osteomyelitis. Pt is s/p R TMA 06/16/23. PMH: DM2, HTN, CKD IV, HLD, mild cognitive impairment, gout  Clinical Impression  Pt seen for PT evaluation with pt agreeable to tx. Pt is oriented to self & situation but not time, pt unable to provide PLOF/where she was living prior to admission, impulsive with mobility & decreased overall safety awareness & recall re: NWB RLE. Pt is able to ambulate with RW & close supervision<>CGA with ongoing cuing re: need to maintain NWB as pt will revert to weight bearing through R heel. Pt notes her POA has ordered her a knee scooter & she's used one in the past. Pt is able to ambulate in hallway with knee scooter with CGA on this date. Pt would benefit from ongoing PT services to address balance, safety awareness, strengthening, & gait with LRAD.    RLE cam boot still not in room; pt utilizes post op shoes on BLE during session.    If plan is discharge home, recommend the following: A little help with walking and/or transfers;A little help with bathing/dressing/bathroom;Assistance with cooking/housework;Assist for transportation;Help with stairs or ramp for entrance   Can travel by private vehicle        Equipment Recommendations None recommended by PT;Other (comment) (pt reports POA has already ordered her a knee scooter)  Recommendations for Other Services       Functional Status Assessment       Precautions / Restrictions Precautions Precautions: Fall Required Braces or Orthoses: Other Brace Other Brace: R CAM boot ordered, L post op shoe Restrictions Weight Bearing Restrictions Per Provider Order: Yes RLE Weight Bearing Per Provider Order: Non weight bearing (in cam boot) LLE Weight  Bearing Per Provider Order: Weight bearing as tolerated (in post op shoe)      Mobility  Bed Mobility               General bed mobility comments: not tested, pt received EOB, left sitting in recliner    Transfers Overall transfer level: Needs assistance Equipment used: Rolling walker (2 wheels) (knee scooter) Transfers: Sit to/from Stand Sit to Stand: Supervision           General transfer comment: cuing/assistance re: locking knee scooter brakes    Ambulation/Gait Ambulation/Gait assistance: Contact guard assist, Supervision Gait Distance (Feet): 150 Feet (+ 10 ft) Assistive device: Knee scooter, Rolling walker (2 wheels)   Gait velocity: decreased with walker     General Gait Details: Pt ambulates in hallway with knee scooter with CGA with pt holding brakes simultaneously while pushing/stepping with LLE as pt noting the knee scooter moves too fast. Pt without overt LOB but generalized decreased safety awareness. Pt ambulates around bed with RW & CGA<>close supervision with ongoing cuing re: need to maintain NWB RLE as pt instead attempts to weight bear through R heel.  Stairs            Wheelchair Mobility     Tilt Bed    Modified Rankin (Stroke Patients Only)       Balance Overall balance assessment: Needs assistance Sitting-balance support: No upper extremity supported, Feet supported Sitting balance-Leahy Scale: Normal     Standing balance support: During functional activity, Bilateral upper extremity supported Standing balance-Leahy Scale: Fair  Pertinent Vitals/Pain Pain Assessment Pain Assessment: No/denies pain    Home Living Family/patient expects to be discharged to:: Assisted living (per chart, pt unable to report)                 Home Equipment: Rolling Walker (2 wheels);Cane - single point;Crutches;Shower seat;Other (comment);Hand held shower head (pt reports a knee scooter has  been ordered by her POA)      Prior Function Prior Level of Function : Needs assist             Mobility Comments: has a RW, reports "they want me to use it" ADLs Comments: sits to shower, independent     Extremity/Trunk Assessment   Upper Extremity Assessment Upper Extremity Assessment: Overall WFL for tasks assessed    Lower Extremity Assessment Lower Extremity Assessment: Generalized weakness;Overall WFL for tasks assessed       Communication   Communication Communication: No apparent difficulties  Cognition Arousal: Alert Behavior During Therapy: Impulsive Overall Cognitive Status: History of cognitive impairments - at baseline                                 General Comments: poor historian, oriented to self, place, but not time, decreased recall & ability to maintain NWB RLE        General Comments      Exercises     Assessment/Plan    PT Assessment Patient needs continued PT services  PT Problem List Decreased strength;Decreased cognition;Decreased knowledge of use of DME;Decreased activity tolerance;Decreased balance;Decreased safety awareness;Decreased mobility;Decreased knowledge of precautions;Decreased skin integrity       PT Treatment Interventions DME instruction;Balance training;Modalities;Gait training;Neuromuscular re-education;Stair training;Functional mobility training;Therapeutic exercise;Manual techniques;Therapeutic activities;Patient/family education    PT Goals (Current goals can be found in the Care Plan section)  Acute Rehab PT Goals Patient Stated Goal: go home PT Goal Formulation: With patient Time For Goal Achievement: 07/02/23 Potential to Achieve Goals: Good    Frequency Min 1X/week     Co-evaluation               AM-PAC PT "6 Clicks" Mobility  Outcome Measure Help needed turning from your back to your side while in a flat bed without using bedrails?: None Help needed moving from lying on your back  to sitting on the side of a flat bed without using bedrails?: None Help needed moving to and from a bed to a chair (including a wheelchair)?: A Little Help needed standing up from a chair using your arms (e.g., wheelchair or bedside chair)?: A Little Help needed to walk in hospital room?: A Little Help needed climbing 3-5 steps with a railing? : Total 6 Click Score: 18    End of Session   Activity Tolerance: Patient tolerated treatment well Patient left: in chair;with chair alarm set;with call bell/phone within reach Nurse Communication: Mobility status;Weight bearing status PT Visit Diagnosis: Unsteadiness on feet (R26.81);Muscle weakness (generalized) (M62.81);Other abnormalities of gait and mobility (R26.89)    Time: 1610-9604 PT Time Calculation (min) (ACUTE ONLY): 23 min   Charges:   PT Evaluation $PT Eval Low Complexity: 1 Low   PT General Charges $$ ACUTE PT VISIT: 1 Visit         Aleda Grana, PT, DPT 06/18/23, 2:16 PM   Sandi Mariscal 06/18/2023, 2:15 PM

## 2023-06-19 DIAGNOSIS — L089 Local infection of the skin and subcutaneous tissue, unspecified: Secondary | ICD-10-CM | POA: Diagnosis not present

## 2023-06-19 DIAGNOSIS — M869 Osteomyelitis, unspecified: Secondary | ICD-10-CM | POA: Diagnosis not present

## 2023-06-19 DIAGNOSIS — L03115 Cellulitis of right lower limb: Secondary | ICD-10-CM | POA: Diagnosis not present

## 2023-06-19 DIAGNOSIS — E11628 Type 2 diabetes mellitus with other skin complications: Secondary | ICD-10-CM | POA: Diagnosis not present

## 2023-06-19 LAB — CBC
HCT: 29.1 % — ABNORMAL LOW (ref 36.0–46.0)
Hemoglobin: 9.7 g/dL — ABNORMAL LOW (ref 12.0–15.0)
MCH: 29.3 pg (ref 26.0–34.0)
MCHC: 33.3 g/dL (ref 30.0–36.0)
MCV: 87.9 fL (ref 80.0–100.0)
Platelets: 235 10*3/uL (ref 150–400)
RBC: 3.31 MIL/uL — ABNORMAL LOW (ref 3.87–5.11)
RDW: 16.4 % — ABNORMAL HIGH (ref 11.5–15.5)
WBC: 9.5 10*3/uL (ref 4.0–10.5)
nRBC: 0 % (ref 0.0–0.2)

## 2023-06-19 LAB — COMPREHENSIVE METABOLIC PANEL
ALT: 19 U/L (ref 0–44)
AST: 42 U/L — ABNORMAL HIGH (ref 15–41)
Albumin: 2.7 g/dL — ABNORMAL LOW (ref 3.5–5.0)
Alkaline Phosphatase: 62 U/L (ref 38–126)
Anion gap: 9 (ref 5–15)
BUN: 11 mg/dL (ref 8–23)
CO2: 25 mmol/L (ref 22–32)
Calcium: 8.2 mg/dL — ABNORMAL LOW (ref 8.9–10.3)
Chloride: 105 mmol/L (ref 98–111)
Creatinine, Ser: 1.57 mg/dL — ABNORMAL HIGH (ref 0.44–1.00)
GFR, Estimated: 33 mL/min — ABNORMAL LOW (ref >60)
Glucose, Bld: 187 mg/dL — ABNORMAL HIGH (ref 70–99)
Potassium: 3.5 mmol/L (ref 3.5–5.1)
Sodium: 139 mmol/L (ref 135–145)
Total Bilirubin: 0.5 mg/dL (ref 0.0–1.2)
Total Protein: 5.2 g/dL — ABNORMAL LOW (ref 6.5–8.1)

## 2023-06-19 LAB — GLUCOSE, CAPILLARY
Glucose-Capillary: 183 mg/dL — ABNORMAL HIGH (ref 70–99)
Glucose-Capillary: 187 mg/dL — ABNORMAL HIGH (ref 70–99)

## 2023-06-19 MED ORDER — OXYCODONE HCL 5 MG PO TABS: 5.0000 mg | ORAL_TABLET | Freq: Four times a day (QID) | ORAL | 0 refills | Status: DC | PRN

## 2023-06-19 MED ORDER — INSULIN DETEMIR 100 UNIT/ML ~~LOC~~ SOLN: 6.0000 [IU] | Freq: Every day | SUBCUTANEOUS | Status: DC

## 2023-06-19 NOTE — Progress Notes (Signed)
Mobility Specialist: Progress Note   06/19/23 1219  Mobility  Activity Ambulated with assistance in hallway  Level of Assistance Standby assist, set-up cues, supervision of patient - no hands on  Assistive Device Other (Comment) (Knee scooter)  Distance Ambulated (ft) 250 ft  RLE Weight Bearing Per Provider Order NWB  LLE Weight Bearing Per Provider Order WBAT  Activity Response Tolerated well  Mobility Referral Yes  Mobility visit 1 Mobility  Mobility Specialist Start Time (ACUTE ONLY) 1158  Mobility Specialist Stop Time (ACUTE ONLY) 1203  Mobility Specialist Time Calculation (min) (ACUTE ONLY) 5 min    Pt was agreeable to mobility session with knee scooter - received on EOB. No complaints. Required cues to control speed. Returned to room without fault. Left on EOB with all needs met, call bell in reach.   Maurene Capes Mobility Specialist Please contact via SecureChat or Rehab office at [phone]

## 2023-06-19 NOTE — TOC Progression Note (Addendum)
Transition of Care [facility]) - Progression Note    Patient Details  Name: Joy Tapia MRN: [id number] Date of Birth: 1940/12/23  [facility]) CM/SW Contact  Alvie Fowles A [place], [place] Phone Number: 06/19/2023, 11:58 AM  Clinical Narrative:     1646  CSW sent updated requested documents to facility to [email or web address]. Nursing notified and will reach back out to facility again now that information has been sent.    TOC completed FL2 and progress notes to facility. CSW contacted [facility] to ensure fax was received, CSW continued to get disconnected in attempt to talk with pt's nurse.  CSW will reach back out again and find out if pt can DC back to [facility] today.        Expected Discharge Plan and Services                                               Social Determinants of Health (SDOH) Interventions SDOH Screenings   Food Insecurity: No Food Insecurity (06/13/2023)  Housing: Low Risk  (06/13/2023)  Transportation Needs: No Transportation Needs (06/13/2023)  Utilities: Not At Risk (06/13/2023)  Social Connections: Moderately Isolated (06/13/2023)  Tobacco Use: Medium Risk (06/16/2023)    Readmission Risk Interventions    04/28/2023    3:08 PM  Readmission Risk Prevention Plan  Transportation Screening Complete  PCP or Specialist Appt within 5-7 Days Complete  Home Care Screening Complete  Medication Review (RN CM) Complete

## 2023-06-19 NOTE — Plan of Care (Signed)

## 2023-06-19 NOTE — Progress Notes (Signed)
Mobility Specialist: Progress Note   06/19/23 1216  Mobility  Activity Ambulated with assistance in room  Level of Assistance Contact guard assist, steadying assist  Assistive Device Front wheel walker  Distance Ambulated (ft) 25 ft  RLE Weight Bearing Per Provider Order NWB  LLE Weight Bearing Per Provider Order WBAT  Activity Response Tolerated well  Mobility Referral Yes  Mobility visit 1 Mobility  Mobility Specialist Start Time (ACUTE ONLY) 0933  Mobility Specialist Stop Time (ACUTE ONLY) 0940  Mobility Specialist Time Calculation (min) (ACUTE ONLY) 7 min    Pt was agreeable to mobility session - received in bed. No complaints. CG throughout. Required mod cues to maintain NWB on RLE - would continuously let R heel rest on the floor for "stability". Difficulty hopping on LLE. Left on EOB with all needs met, call bell in reach.   Maurene Capes Mobility Specialist Please contact via SecureChat or Rehab office at [phone]

## 2023-06-19 NOTE — Care Management (Signed)
Called [facility] [phone] they have not received the fax sent earlier with Fl2 and summary. Will resend to email  [email or web address]

## 2023-06-19 NOTE — TOC Transition Note (Addendum)
Transition of Care [facility]) - Discharge Note   Patient Details  Name: Joy Tapia MRN: [id number] Date of Birth: February 26, 1941  [facility]) CM/SW Contact:  Lockie Pares, RN Phone Number: 06/19/2023, 4:19 PM   Clinical Narrative:    Clovis Cao and summary sent by Raymondo Band CSW to fax number. Spoke to Ms Joy Tapia on the phone who states they did not receive it. Gave [place] a email to  email information [email or web address]. This was done by [facility] CSW.  They did get the information but had to submit to RN for review, not accepting her back yet pending this review. She is su[posed to call Nursing when RN reviews, POA on the way to pick up the patient. She is discharged.  Kelly from [facility] aware that patients HH orders are in and that she should be DC today   Final next level of care: Assisted Living (With Home health) Barriers to Discharge:  (Assisted living has to notify RN with FL2, Notes)   Patient Goals and CMS Choice            Discharge Placement             Assisted living with home health          Discharge Plan and Services Additional resources added to the After Visit Summary for                            [facility] Arranged: PT, OT, RN [facility] Agency: [facility] Date Brand [facility] Agency Contacted: 06/19/23 Time HH Agency Contacted: [phone] Representative spoke with at [facility] Agency: Tresa Endo  Social Drivers of Health (SDOH) Interventions SDOH Screenings   Food Insecurity: No Food Insecurity (06/13/2023)  Housing: Low Risk  (06/13/2023)  Transportation Needs: No Transportation Needs (06/13/2023)  Utilities: Not At Risk (06/13/2023)  Social Connections: Moderately Isolated (06/13/2023)  Tobacco Use: Medium Risk (06/16/2023)     Readmission Risk Interventions    04/28/2023    3:08 PM  Readmission Risk Prevention Plan  Transportation Screening Complete  PCP or Specialist Appt within 5-7 Days Complete  Home Care Screening Complete  Medication Review (RN CM)  Complete

## 2023-06-19 NOTE — Progress Notes (Signed)
Occupational Therapy Treatment Patient Details Name: Joy Tapia MRN: [id number] DOB: 11/27/1940 Today's Date: 06/19/2023   History of present illness Pt is an 83 y/o F admitted on 06/12/23 after presenting with c/o worsening wound infection with evidence of osteomyelitis. Pt is s/p R TMA 06/16/23. PMH: DM2, HTN, CKD IV, HLD, mild cognitive impairment, gout   OT comments  Confirmed that pt has a knee scooter for home with Cathy, pt's POA. No CAM boot in room, called [employer] to have delivered. Pt continues to have difficulty generalizing NWB in ADLs, needs max verbal cues to unweight R LE when managing pants after toileting and grooming at sink. Instructed to lean side to side in sitting during bathing and dressing. Secure chat that POA is requesting HHRN for dressing changes, ALF cannot provide wound care.       If plan is discharge home, recommend the following:  A little help with walking and/or transfers;A little help with bathing/dressing/bathroom;Help with stairs or ramp for entrance;Assistance with cooking/housework;Direct supervision/assist for medications management;Direct supervision/assist for financial management;Assist for transportation   Equipment Recommendations  None recommended by OT    Recommendations for Other Services      Precautions / Restrictions Precautions Precautions: Fall Required Braces or Orthoses: Other Brace Other Brace: R CAM boot ordered--called [employer] to follow up, L post op shoe Restrictions Weight Bearing Restrictions Per Provider Order: Yes RLE Weight Bearing Per Provider Order: Non weight bearing (in cam boot) LLE Weight Bearing Per Provider Order: Weight bearing as tolerated (in post op shoe)       Mobility Bed Mobility               General bed mobility comments: EOB at end of session    Transfers Overall transfer level: Needs assistance Equipment used: Standard walker Transfers: Sit to/from Stand Sit to Stand:  Supervision           General transfer comment: cues for technique/safety/ NWB status     Balance Overall balance assessment: Needs assistance   Sitting balance-Leahy Scale: Normal       Standing balance-Leahy Scale: Poor Standing balance comment: leans on sink or reliant on RW                           ADL either performed or assessed with clinical judgement   ADL Overall ADL's : Needs assistance/impaired     Grooming: Oral care;Wash/dry hands;Wash/dry face;Standing;Supervision/safety                   Toilet Transfer: Contact guard assist;Rolling walker (2 wheels);Ambulation;Regular Toilet;Grab bars   Toileting- Clothing Manipulation and Hygiene: Set up;Sitting/lateral lean Toileting - Clothing Manipulation Details (indicate cue type and reason): cues for NWB with sit to stand to manage pants     Functional mobility during ADLs: Contact guard assist;Rolling walker (2 wheels)      Extremity/Trunk Assessment              Vision       Perception     Praxis      Cognition Arousal: Alert Behavior During Therapy: Impulsive Overall Cognitive Status: History of cognitive impairments - at baseline                                 General Comments: called POA, Cathy, she has knee walker purchased for pt at home  Exercises      Shoulder Instructions       General Comments      Pertinent Vitals/ Pain       Pain Assessment Pain Assessment: 0-10 Faces Pain Scale: Hurts a little bit Pain Location: R foot Pain Descriptors / Indicators: Shooting Pain Intervention(s): Monitored during session, Repositioned  Home Living                                          Prior Functioning/Environment              Frequency  Min 1X/week        Progress Toward Goals  OT Goals(current goals can now be found in the care plan section)  Progress towards OT goals: Progressing toward goals  Acute  Rehab OT Goals OT Goal Formulation: With patient Time For Goal Achievement: 07/02/23 Potential to Achieve Goals: Good  Plan      Co-evaluation                 AM-PAC OT "6 Clicks" Daily Activity     Outcome Measure   Help from another person eating meals?: None Help from another person taking care of personal grooming?: A Little Help from another person toileting, which includes using toliet, bedpan, or urinal?: A Little Help from another person bathing (including washing, rinsing, drying)?: A Little Help from another person to put on and taking off regular upper body clothing?: A Little Help from another person to put on and taking off regular lower body clothing?: A Little 6 Click Score: 19    End of Session Equipment Utilized During Treatment: Gait belt;Rolling walker (2 wheels)  OT Visit Diagnosis: Other symptoms and signs involving cognitive function;Other abnormalities of gait and mobility (R26.89)   Activity Tolerance Patient tolerated treatment well   Patient Left in bed;with call bell/phone within reach;with bed alarm set   Nurse Communication Weight bearing status        Time: 2130-8657 OT Time Calculation (min): 26 min  Charges: OT General Charges $OT Visit: 1 Visit OT Treatments $Self Care/Home Management : 23-37 mins  Berna Spare, OTR/L Acute Rehabilitation Services Office: [phone]   Evern Bio 06/19/2023, 10:07 AM

## 2023-06-19 NOTE — Progress Notes (Signed)
  Subjective:  Patient ID: Joy Tapia, female    DOB: Jul 18, 1940,  MRN: [id number]  Chief Complaint  Patient presents with   Wound Infection    DOS: 06/16/2023 Procedure: 1. Excisional debridement of ulceration to sub cutaneous fat level with prep for graft 1x1x0.2 cm, left 2. Application amniotic graft 1.5 x 1.5 x 0.3 cm, left   3. Transmetatarsal amputation of right foot 4. Tendo achilles lengthening of right foot  83 y.o. female seen for post op check.  She reports she is doing well she has no pain in either foot at this time. Has cam boot on right and post op shoe on left. No concerns. DC planning back to SNF today  Review of Systems: Negative except as noted in the HPI. Denies N/V/F/Ch.   Objective:   Vitals:   06/19/23 0358 06/19/23 0742  BP: (!) 157/63 (!) 168/64  Pulse: 69 69  Resp: 17 18  Temp: 98.2 F (36.8 C) 98.1 F (36.7 C)  SpO2: 100% 100%   Body mass index is 24.24 kg/m. Constitutional Well developed. Well nourished.  Vascular Foot warm and well perfused. Capillary refill normal to all digits.   No calf pain with palpation  Neurologic Normal speech. Oriented to person, place, and time. Epicritic sensation absent to right foot  Dermatologic TMA site healing well no acute issues Left foot graft intact, wound healing   Orthopedic: Status post right foot TMA   Radiographs: Interval transmetatarsal amputation of the right foot  Pathology:  Skin with ulcer and necrotizing inflammation  Micro: C. Striatum  Assessment:   1. Cellulitis of right lower extremity   2. Osteomyelitis of right foot, unspecified type [facility])    Plan:  Patient was evaluated and treated and all questions answered.  POD # 3 s/p right foot transmetatarsal amputation and tendo Achilles lengthening and left foot ulcer debridement and graft application -Progressing as expected postoperatively denies pain. Amp site viable on right and wound improved on left. -Patient  requires cam boot for the right lower extremity.  Reinforced need for nonweightbearing to right foot.   -XR: Expected postop changes -WB Status: Nonweightbearing in cam boot to right foot and weightbearing as tolerated to left foot in postop shoe -Sutures: To remain intact 2 to 3 weeks. -Medications/ABX: Recommend 7 to 10 days Augmentin or per culture on discharge -Dressings changed. Now to remain clean dry and intact for 1 week. - Return to clinic 1 week from today in [place] location.  Will sign off at this time please epic chat with any questions or concerns        Corinna Gab, DPM [facility] / [facility]

## 2023-06-19 NOTE — Discharge Summary (Signed)
Physician Discharge Summary   Patient: Joy Tapia MRN: [id number] DOB: 06/04/1940  Admit date:     06/12/2023  Discharge date: 06/19/23  Discharge Physician: Rickey Barbara   PCP: System, Provider Not In   Recommendations at discharge:    Follow up with PCP in 1-2  Follow up with Podiatry in 1 week  Discharge Diagnoses: Principal Problem:   Diabetic foot infection (HCC) Active Problems:   Hyperlipidemia associated with type 2 diabetes mellitus (HCC)   Mild cognitive impairment   Diabetes mellitus (HCC)   CKD (chronic kidney disease) stage 4, GFR 15-29 ml/min (HCC)   HTN (hypertension)   Equinus deformity of right foot   Ulcer of left foot with fat layer exposed (HCC)  Resolved Problems:   * No resolved hospital problems. *  Hospital Course: 83 y.o. female with a history of diabetes mellitus type 2, hypertension, CKD stage IV, hyperlipidemia, mild cognitive impairment.  Patient presented secondary to a progressively worsening wound infection with evidence of osteomyelitis requiring amputation.  Empiric antibiotics started.  Podiatry consulted for management.   Assessment and Plan: Diabetic right foot infection Osteomyelitis of right foot Worsening right foot ulcer with evidence of osteomyelitis on x-ray, confirmed on MRI affecting the second metatarsal and third proximal phalanx. Associated bone necrosis. Podiatry consulted with plan for amputation. ABIs ordered and are significant for normal right side and abnormal left TBI. Empiric antibiotics started. Right TMA performed on 1/20. -Discontinue Vancomycin, Ceftriaxone and Flagyl tomorrow and transition to oral antibiotics per podiatry recommendations (7 days PO on discharge) -Podiatry recommendations for WBAT in a post op shoe to left foot and NWB to right foot' -Podiatry to f/u in 1 week   Left foot ulcer S/p excision debridement with placement of amniotic graft on 1/20.   Primary hypertension -Continue Toprol XL    CKD stage IV Stable.   Diabetes mellitus type 2 Seems to be well controlled using last hemoglobin A1C of 7.4% from 06/2022. Patient is on Levemir and glimepiride as an outpatient. Patient started on Semglee and SSI on admission. -Continue Semglee and SSI -Will plan to d/c glimepiride on discharge   Mild cognitive impairment Noted. Patient states that she has a healthcare power of attorney, but that she is not her daughter, as is documented in chart, but rather a friend.   Hyperlipidemia -Continue pravastatin       Consultants: Podiatry Procedures performed: right foot transmetatarsal amputation and tendo Achilles lengthening and left foot ulcer debridement and graft application   Disposition: Assisted living Diet recommendation:  Carb modified diet DISCHARGE MEDICATION: Allergies as of 06/19/2023       Reactions   Atorvastatin Diarrhea   Not listed on the MAR   Azithromycin Other (See Comments)   Pt states burns her insides. Not listed on the [facility]   Erythromycin Other (See Comments)   Not listed on the [facility]   Other    Other Reaction(s): Unknown Adhesive bandage  Anesthesia Extension set  -  unknown Not listed on the [facility]   Propoxyphene Other (See Comments)   Not listed on the [facility]        Medication List     STOP taking these medications    doxycycline 100 MG tablet Commonly known as: VIBRA-TABS   glimepiride 4 MG tablet Commonly known as: AMARYL       TAKE these medications    acetaminophen 325 MG tablet Commonly known as: TYLENOL Take 650 mg by mouth every 8 (eight) hours as  needed.   albuterol 108 (90 Base) MCG/ACT inhaler Commonly known as: VENTOLIN HFA Inhale into the lungs.   aspirin EC 81 MG tablet Take 81 mg by mouth daily.   cetirizine 10 MG tablet Commonly known as: ZYRTEC Take 10 mg by mouth daily.   Febuxostat 80 MG Tabs Take 1 tablet by mouth daily.   fluticasone 50 MCG/ACT nasal spray Commonly known as: FLONASE Place 1 spray  into both nostrils daily as needed for allergies or rhinitis.   furosemide 40 MG tablet Commonly known as: LASIX Take 1 tablet (40 mg total) by mouth daily.   insulin detemir 100 UNIT/ML injection Commonly known as: LEVEMIR Inject 0.06 mLs (6 Units total) into the skin daily. What changed: how much to take   INSULIN SYRINGE .5CC/28G 28G X 1/2" 0.5 ML Misc 1 Application by Does not apply route daily.   metoprolol succinate 50 MG 24 hr tablet Commonly known as: TOPROL-XL Take 50 mg by mouth daily.   multivitamin capsule Take 1 capsule by mouth daily.   mupirocin ointment 2 % Commonly known as: BACTROBAN Apply 1 Application topically 2 (two) times daily.   omeprazole 20 MG capsule Commonly known as: PRILOSEC Take 20 mg by mouth daily.   oxyCODONE 5 MG immediate release tablet Commonly known as: Oxy IR/ROXICODONE Take 1 tablet (5 mg total) by mouth every 6 (six) hours as needed for moderate pain (pain score 4-6).   pravastatin 20 MG tablet Commonly known as: PRAVACHOL Take 20 mg by mouth at bedtime.        Follow-up Information     Follow up with PCP in 1-2 weeks Follow up.   Why: Hospital follow up        Pilar Plate, DPM Follow up in 1 week(s).   Specialty: Podiatry Why: Hospital follow up Contact information: [address] [place] [address] [phone]                Discharge Exam: Ceasar Mons Weights   06/12/23 1637 06/16/23 0857  Weight: 54.4 kg 54.4 kg   General exam: Awake, laying in bed, in nad Respiratory system: Normal respiratory effort, no wheezing Cardiovascular system: regular rate, s1, s2 Gastrointestinal system: Soft, nondistended, positive BS Central nervous system: CN2-12 grossly intact, strength intact Extremities: Perfused, no clubbing Skin: Normal skin turgor, no notable skin lesions seen Psychiatry: Mood normal // no visual hallucinations   Condition at discharge: fair  The results of  significant diagnostics from this hospitalization (including imaging, microbiology, ancillary and laboratory) are listed below for reference.   Imaging Studies: DG Foot Complete Left Result Date: 06/17/2023 Please see detailed radiograph report in office note.  DG Foot Complete Left Result Date: 06/17/2023 Please see detailed radiograph report in office note.  DG Foot 2 Views Right Result Date: 06/16/2023 CLINICAL DATA:  Postop EXAM: RIGHT FOOT - 2 VIEW COMPARISON:  04/26/2023, MRI 06/12/2023 FINDINGS: Interval transmetatarsal amputation of the first through fifth digits. Gas in the soft tissues consistent with recent surgery. Smooth cut margins. Moderate plantar calcaneal spur. Clips posterior to the ankle. Possible ulceration along the anterior aspect of the lower leg and ankle IMPRESSION: Interval transmetatarsal amputation of the first through fifth digits. Electronically Signed   By: Jasmine Pang M.D.   On: 06/16/2023 16:57   VAS [place] ABI WITH/WO TBI Result Date: 06/13/2023  LOWER EXTREMITY DOPPLER STUDY Patient Name:  JAIDALYNN ACCURSO  Date of Exam:   06/13/2023 Medical Rec #: [id number]  Accession #:    [id number] Date of Birth: 11-Dec-1940            Patient Gender: F Patient Age:   83 years Exam Location:  [facility] Procedure:      VAS [place] ABI WITH/WO TBI Referring Phys: JARED GARDNER --------------------------------------------------------------------------------  Indications: Ulceration. Right great toe amputation. High Risk         Hypertension, hyperlipidemia, Diabetes, past history of Factors:          smoking.  Limitations: Today's exam was limited due to involuntary patient movement,              bandages and Left foot in cast. Comparison Study: No prior exam. Performing Technologist: Fernande Bras  Examination Guidelines: A complete evaluation includes at minimum, Doppler waveform signals and systolic blood pressure reading at the level of bilateral brachial,  anterior tibial, and posterior tibial arteries, when vessel segments are accessible. Bilateral testing is considered an integral part of a complete examination. Photoelectric Plethysmograph (PPG) waveforms and toe systolic pressure readings are included as required and additional duplex testing as needed. Limited examinations for reoccurring indications may be performed as noted.  ABI Findings: +---------+------------------+-----+----------+----------+ Right    Rt Pressure (mmHg)IndexWaveform  Comment    +---------+------------------+-----+----------+----------+ Brachial 163                    triphasic            +---------+------------------+-----+----------+----------+ PTA      132               0.76 monophasic           +---------+------------------+-----+----------+----------+ DP       180               1.03 monophasic           +---------+------------------+-----+----------+----------+ Great Toe145               0.83 Normal    2nd digit. +---------+------------------+-----+----------+----------+ +---------+------------------+-----+----------+-------+ Left     Lt Pressure (mmHg)IndexWaveform  Comment +---------+------------------+-----+----------+-------+ Brachial 174                    triphasic         +---------+------------------+-----+----------+-------+ PTA      170               0.98 monophasic        +---------+------------------+-----+----------+-------+ DP       172               0.99 monophasic        +---------+------------------+-----+----------+-------+ Chaney Malling               1.34 Abnormal          +---------+------------------+-----+----------+-------+ +-------+-----------+-----------+------------+------------+ ABI/TBIToday's ABIToday's TBIPrevious ABIPrevious TBI +-------+-----------+-----------+------------+------------+ Right  1.03       .83                                  +-------+-----------+-----------+------------+------------+ Left   .99        1.34                                +-------+-----------+-----------+------------+------------+   Summary: Right: Resting right ankle-brachial index is within normal range. The right toe-brachial index is normal. Left: Resting left ankle-brachial index is within normal  range. The left toe-brachial index is abnormal. *See table(s) above for measurements and observations.  Electronically signed by Sherald Hess MD on 06/13/2023 at 5:06:01 PM.    Final    MRI Right foot without contrast Result Date: 06/13/2023 CLINICAL DATA:  83 year old female with history of diabetes with chronic wound on the plantar surface of the second MTP joint. EXAM: MRI OF THE RIGHT FOREFOOT WITHOUT CONTRAST TECHNIQUE: Multiplanar, multisequence MR imaging of the right foot was performed. No intravenous contrast was administered. COMPARISON:  Right foot radiographs dated June 12, 2023. FINDINGS: Bones/Joint/Cartilage The second metatarsal demonstrates T2 hyperintense signal abnormality with corresponding confluent T1 hypointensity, compatible with osteomyelitis. There is dorsal dislocation of the second proximal phalanx relative to the second metatarsal at the level of second MTP joint. T2 hyperintensity of the second proximal phalanx without definite corresponding T1 hypointense signal abnormality, suggestive of reactive marrow edema. The third proximal phalanx demonstrates T2 hyperintense signal with corresponding confluent T1 hypointensity, compatible with osteomyelitis. No convincing evidence to suggest osteomyelitis involving the remainder of the visualized osseous structures. Status post amputation of the first ray at the level of the mid first metatarsal. The transsection margins appear intact without appreciable signal abnormality. Diffuse interphalangeal joint space narrowing. TMT joints are anatomically aligned with joint space narrowing and  subchondral cystic change at the third and fourth TMT joints. Hammertoe deformities of the third through fifth digits. Ligaments Lisfranc ligament is intact. Muscles and Tendons Partially visualized tenosynovitis of the flexor hallucis longus and flexor digitorum tendons at the level of the knot of Henry. The second flexor digitorum tendon courses around the medial aspect of second metatarsal head at the level of the dislocated second MTP joint joint. Moderate atrophy of the intrinsic musculature of the foot with diffuse edema. Soft tissue There is a soft tissue wound at the plantar aspect of the forefoot, overlying the level of the second metatarsal head which appears to track deep into the dorsal soft tissues between the second and third MTP joints into a T2 hyperintense fluid collection containing air. This collection measures approximately 1.7 x 1.7 by 1.6 cm (series 6, image 14 and series 8, image 14). There is diffuse subcutaneous edema throughout the forefoot, most pronounced dorsally. IMPRESSION: 1. Findings compatible with osteomyelitis of the second metatarsal and the third proximal phalanx. There is dorsal dislocation at the level of second MTP joint. 2. T2 hyperintensity of the second proximal phalanx without appreciable corresponding T1 hypointense signal abnormality, suggestive of reactive marrow edema. 3. Soft tissue wound at the plantar aspect of the forefoot overlying the level of the second metatarsal head which appears to track deep into the dorsal soft tissues between the second and third MTP joints into a fluid collection containing air. The sterility of the collection remains indeterminate, however, these findings are concerning for gas-forming soft tissue infection. 4. Diffuse edema throughout the forefoot, concerning for cellulitis. Moderate atrophy of the intrinsic musculature of the foot with diffuse edema, which could represent myositis. 5. Status post amputation of the first ray at the  level of the mid first metatarsal. The transsection margins appear intact. Electronically Signed   By: Hart Robinsons M.D.   On: 06/13/2023 08:49   DG Foot Complete Right Result Date: 06/12/2023 Please see detailed radiograph report in office note.  DG Foot Complete Right Result Date: 05/22/2023 Please see detailed radiograph report in office note.   Microbiology: Results for orders placed or performed during the hospital encounter of 06/12/23  Blood Cultures  x 2 sites     Status: None   Collection Time: 06/12/23  4:56 PM   Specimen: BLOOD  Result Value Ref Range Status   Specimen Description BLOOD SITE NOT SPECIFIED  Final   Special Requests   Final    BOTTLES DRAWN AEROBIC ONLY Blood Culture results may not be optimal due to an inadequate volume of blood received in culture bottles   Culture   Final    NO GROWTH 5 DAYS Performed at [facility], [address], [place], [place] [address]    Report Status 06/17/2023 FINAL  Final  Blood Cultures x 2 sites     Status: None   Collection Time: 06/13/23  3:15 PM   Specimen: BLOOD RIGHT HAND  Result Value Ref Range Status   Specimen Description BLOOD RIGHT HAND  Final   Special Requests   Final    BOTTLES DRAWN AEROBIC AND ANAEROBIC Blood Culture results may not be optimal due to an inadequate volume of blood received in culture bottles   Culture   Final    NO GROWTH 5 DAYS Performed at [facility], [address] [place], [place] [address]    Report Status 06/18/2023 FINAL  Final  Aerobic/Anaerobic Culture w Gram Stain (surgical/deep wound)     Status: None (Preliminary result)   Collection Time: 06/16/23 11:07 AM   Specimen: PATH Benign ortho; Tissue  Result Value Ref Range Status   Specimen Description TISSUE  Final   Special Requests RIGHT 2ND METATARSAL  Final   Gram Stain NO WBC SEEN NO ORGANISMS SEEN   Final   Culture   Final    NO GROWTH 3 DAYS NO ANAEROBES ISOLATED; CULTURE IN PROGRESS FOR 5  DAYS Performed at [facility], [address], [place], [place] [address]    Report Status PENDING  Incomplete  Aerobic/Anaerobic Culture w Gram Stain (surgical/deep wound)     Status: None (Preliminary result)   Collection Time: 06/16/23 11:21 AM   Specimen: Path fluid; Body Fluid  Result Value Ref Range Status   Specimen Description WOUND  Final   Special Requests LEFT FOOT ULCER  Final   Gram Stain   Final    NO WBC SEEN NO ORGANISMS SEEN Performed at [facility], [address] [place], [place] [address]    Culture   Final    RARE CORYNEBACTERIUM STRIATUM Standardized susceptibility testing for this organism is not available. NO ANAEROBES ISOLATED; CULTURE IN PROGRESS FOR 5 DAYS    Report Status PENDING  Incomplete    Labs: CBC: Recent Labs  Lab 06/12/23 1756 06/13/23 0603 06/15/23 4742 06/17/23 0538 06/18/23 0547 06/19/23 0702  WBC 13.1* 9.5 8.9 13.1* 12.7* 9.5  NEUTROABS 9.2*  --   --   --   --   --   HGB 12.2 11.2* 11.8* 9.1* 9.8* 9.7*  HCT 38.1 34.3* 36.3 27.8* 30.1* 29.1*  MCV 88.8 87.1 86.2 86.6 87.8 87.9  PLT 316 270 294 241 240 235   Basic Metabolic Panel: Recent Labs  Lab 06/13/23 0603 06/15/23 0634 06/16/23 0557 06/17/23 0538 06/18/23 0547 06/19/23 0702  NA 138 140 139  --  136 139  K 3.6 3.5 3.7  --  3.4* 3.5  CL 104 106 105  --  104 105  CO2 24 23 23   --  22 25  GLUCOSE 152* 151* 162*  --  172* 187*  BUN 20 20 18   --  12 11  CREATININE 1.56* 2.06* 1.63* 1.59* 1.37* 1.57*  CALCIUM 8.1* 8.2* 8.3*  --  8.0* 8.2*   Liver Function Tests: Recent Labs  Lab 06/12/23 1756 06/19/23 0702  AST 45* 42*  ALT 27 19  ALKPHOS 95 62  BILITOT 0.8 0.5  PROT 6.2* 5.2*  ALBUMIN 2.9* 2.7*   CBG: Recent Labs  Lab 06/18/23 1623 06/18/23 1635 06/18/23 2114 06/19/23 0740 06/19/23 1145  GLUCAP 208* 214* 233* 183* 187*    Discharge time spent: less than 30 minutes.  Signed: Rickey Barbara, MD [facility] 06/19/2023

## 2023-06-19 NOTE — Discharge Instructions (Signed)
Per Podiatry: -Patient requires cam boot for the right lower extremity.  Reinforced need for nonweightbearing to right foot.   -XR: Expected postop changes -WB Status: Nonweightbearing in cam boot to right foot and weightbearing as tolerated to left foot in postop shoe -Sutures: To remain intact 2 to 3 weeks

## 2023-06-19 NOTE — [place] FL2 (Addendum)
[place] MEDICAID FL2 LEVEL OF CARE FORM     IDENTIFICATION  Patient Name: Joy Tapia Birthdate: 1941/03/12 Sex: female Admission Date (Current Location): 06/12/2023  [facility] and [place] Number:  Producer, television/film/video and Address:  [facility] [place], [address], [place], [place] [address]      Provider Number: [id number]  Attending Physician Name and Address:  Jerald Kief, MD  Relative Name and Phone Number:  Eulah Pont [phone]    Current Level of Care: Hospital Recommended Level of Care: Assisted Living Facility Prior Approval Number:    Date Approved/Denied:   PASRR Number: [id number]  Discharge Plan: Other (Comment) (Assisted living)    Current Diagnoses: Patient Active Problem List   Diagnosis Date Noted   Equinus deformity of right foot 06/16/2023   Ulcer of left foot with fat layer exposed (HCC) 06/16/2023   Diabetic foot infection (HCC) 06/13/2023   Cellulitis 04/26/2023   Fracture of patella 03/05/2023   Hoarding disorder 07/02/2022   CKD (chronic kidney disease) stage 4, GFR 15-29 ml/min (HCC) 06/29/2022   HTN (hypertension) 06/29/2022   Osteomyelitis (HCC) 06/28/2022   Diabetes mellitus (HCC) 04/29/2022   Mild cognitive impairment 06/26/2021   Non-compliance 04/11/2019   Health care maintenance 09/15/2014   Gout 12/25/2013   Hyperlipidemia associated with type 2 diabetes mellitus (HCC) 12/25/2013   Hypertension in stage 4 chronic kidney disease due to type 2 diabetes mellitus (HCC) 12/25/2013   Osteoporosis 12/25/2013    Orientation RESPIRATION BLADDER Height & Weight     Self  Normal Continent Weight: 54.4 kg Height:  4\' 11"  (149.9 cm)  BEHAVIORAL SYMPTOMS/MOOD NEUROLOGICAL BOWEL NUTRITION STATUS      Continent Carb modified diet   AMBULATORY STATUS COMMUNICATION OF NEEDS Skin   Limited Assist (knee Scooter) Verbally Surgical wounds (metatarsal amputation)                       Personal Care  Assistance Level of Assistance  Dressing, Bathing Bathing Assistance: Limited assistance   Dressing Assistance: Limited assistance     Functional Limitations Info             SPECIAL CARE FACTORS FREQUENCY  PT (By licensed PT), OT (By licensed OT)     PT Frequency: Eval and treat OT Frequency: Eval and treat            Contractures Contractures Info: Not present    Additional Factors Info  Code Status Code Status Info: Full             Current Medications (06/19/2023):  This is the current hospital active medication list Current Facility-Administered Medications  Medication Dose Route Frequency Provider Last Rate Last Admin   acetaminophen (TYLENOL) tablet 650 mg  650 mg Oral Q6H PRN Hillary Bow, DO   650 mg at 06/18/23 2536   Or   acetaminophen (TYLENOL) suppository 650 mg  650 mg Rectal Q6H PRN Hillary Bow, DO       albuterol (PROVENTIL) (2.5 MG/3ML) 0.083% nebulizer solution 2.5 mg  2.5 mg Inhalation Q6H PRN Hillary Bow, DO       aspirin EC tablet 81 mg  81 mg Oral Daily Lyda Perone M, DO   81 mg at 06/19/23 0831   cefTRIAXone (ROCEPHIN) 2 g in sodium chloride 0.9 % 100 mL IVPB  2 g Intravenous Q24H Lyda Perone M, DO 200 mL/hr at 06/19/23 0837 2 g at 06/19/23 [phone]  dextrose 50 % solution 12.5 g  12.5 g Intravenous PRN John Giovanni, MD       enoxaparin (LOVENOX) injection 30 mg  30 mg Subcutaneous Q24H Julian Reil, Jared M, DO   30 mg at 06/18/23 1718   febuxostat (ULORIC) tablet 40 mg  40 mg Oral Daily Narda Bonds, MD   40 mg at 06/19/23 0830   feeding supplement (ENSURE ENLIVE / ENSURE PLUS) liquid 237 mL  237 mL Oral BID BM Narda Bonds, MD   237 mL at 06/18/23 1403   fentaNYL (SUBLIMAZE) injection 12.5 mcg  12.5 mcg Intravenous Q4H PRN John Giovanni, MD       furosemide (LASIX) tablet 40 mg  40 mg Oral Daily Lyda Perone M, DO   40 mg at 06/19/23 0831   guaiFENesin (MUCINEX) 12 hr tablet 600 mg  600 mg Oral BID PRN John Giovanni, MD   600 mg at 06/17/23 0103   insulin aspart (novoLOG) injection 0-9 Units  0-9 Units Subcutaneous TID WC Narda Bonds, MD   2 Units at 06/19/23 2956   metoprolol succinate (TOPROL-XL) 24 hr tablet 50 mg  50 mg Oral Daily Lyda Perone M, DO   50 mg at 06/19/23 0830   metroNIDAZOLE (FLAGYL) tablet 500 mg  500 mg Oral Q12H Lyda Perone M, DO   500 mg at 06/19/23 0830   multivitamin with minerals tablet 1 tablet  1 tablet Oral Daily Narda Bonds, MD   1 tablet at 06/19/23 0831   mupirocin cream (BACTROBAN) 2 %   Topical Daily Narda Bonds, MD   Given at 06/19/23 0831   naloxone [facility]) injection 0.4 mg  0.4 mg Intravenous PRN John Giovanni, MD       ondansetron [facility]) tablet 4 mg  4 mg Oral Q6H PRN Hillary Bow, DO       Or   ondansetron [facility]) injection 4 mg  4 mg Intravenous Q6H PRN Hillary Bow, DO       oxyCODONE (Oxy IR/ROXICODONE) immediate release tablet 5 mg  5 mg Oral Q6H PRN John Giovanni, MD   5 mg at 06/18/23 0052   pantoprazole (PROTONIX) EC tablet 40 mg  40 mg Oral Daily Lyda Perone M, DO   40 mg at 06/19/23 0830   phenol (CHLORASEPTIC) mouth spray 1 spray  1 spray Mouth/Throat PRN Narda Bonds, MD   1 spray at 06/16/23 2319   pravastatin (PRAVACHOL) tablet 10 mg  10 mg Oral QHS Narda Bonds, MD   10 mg at 06/18/23 2155   vancomycin (VANCOCIN) 750 mg in sodium chloride 0.9 % 250 mL IVPB  750 mg Intravenous Q48H Rollene Fare, RPH 250 mL/hr at 06/18/23 2304 750 mg at 06/18/23 2304     Discharge Medications: Medication List       STOP taking these medications     doxycycline 100 MG tablet Commonly known as: VIBRA-TABS    glimepiride 4 MG tablet Commonly known as: AMARYL           TAKE these medications     acetaminophen 325 MG tablet Commonly known as: TYLENOL Take 650 mg by mouth every 8 (eight) hours as needed.    albuterol 108 (90 Base) MCG/ACT inhaler Commonly known as: VENTOLIN HFA Inhale into the  lungs.    aspirin EC 81 MG tablet Take 81 mg by mouth daily.    cetirizine 10 MG tablet Commonly known as: ZYRTEC Take 10 mg by  mouth daily.    Febuxostat 80 MG Tabs Take 1 tablet by mouth daily.    fluticasone 50 MCG/ACT nasal spray Commonly known as: FLONASE Place 1 spray into both nostrils daily as needed for allergies or rhinitis.    furosemide 40 MG tablet Commonly known as: LASIX Take 1 tablet (40 mg total) by mouth daily.    insulin detemir 100 UNIT/ML injection Commonly known as: LEVEMIR Inject 0.06 mLs (6 Units total) into the skin daily. What changed: how much to take    INSULIN SYRINGE .5CC/28G 28G X 1/2" 0.5 ML Misc 1 Application by Does not apply route daily.    metoprolol succinate 50 MG 24 hr tablet Commonly known as: TOPROL-XL Take 50 mg by mouth daily.    multivitamin capsule Take 1 capsule by mouth daily.    mupirocin ointment 2 % Commonly known as: BACTROBAN Apply 1 Application topically 2 (two) times daily.    omeprazole 20 MG capsule Commonly known as: PRILOSEC Take 20 mg by mouth daily.    oxyCODONE 5 MG immediate release tablet Commonly known as: Oxy IR/ROXICODONE Take 1 tablet (5 mg total) by mouth every 6 (six) hours as needed for moderate pain (pain score 4-6).    pravastatin 20 MG tablet Commonly known as: PRAVACHOL Take 20 mg by mouth at bedtime.    Relevant Imaging Results:  Relevant Lab Results:   Additional Information SSN #[id number]  Lockie Pares, RN

## 2023-06-21 LAB — AEROBIC/ANAEROBIC CULTURE W GRAM STAIN (SURGICAL/DEEP WOUND)
Culture: NO GROWTH
Gram Stain: NONE SEEN
Gram Stain: NONE SEEN

## 2023-06-23 ENCOUNTER — Telehealth: Payer: Self-pay

## 2023-06-23 NOTE — Telephone Encounter (Signed)
Rob, physical therapist with [facility], called and left a message. Patient has been walking on the heel of her right foot, wearing the boot, but still weight bearing . He just wants some guidance and clarification about what she should and should not be doing

## 2023-06-24 ENCOUNTER — Encounter: Payer: Medicare Other | Admitting: Podiatry

## 2023-06-26 ENCOUNTER — Ambulatory Visit (INDEPENDENT_AMBULATORY_CARE_PROVIDER_SITE_OTHER): Payer: Medicare Other | Admitting: Podiatry

## 2023-06-26 DIAGNOSIS — E11621 Type 2 diabetes mellitus with foot ulcer: Secondary | ICD-10-CM

## 2023-06-26 DIAGNOSIS — L97514 Non-pressure chronic ulcer of other part of right foot with necrosis of bone: Secondary | ICD-10-CM

## 2023-06-26 DIAGNOSIS — L97522 Non-pressure chronic ulcer of other part of left foot with fat layer exposed: Secondary | ICD-10-CM

## 2023-06-26 NOTE — Progress Notes (Signed)
  Subjective:  Patient ID: Joy Tapia, female    DOB: December 30, 1940,  MRN: [id number]  Chief Complaint  Patient presents with   Toe Amputation    RT 1-5. Denies pain. Minimal drainage. Some bleeding onto bandage. HCPA, Lynden Ang, is with her today.    Wound Check    LT foot. Some stinging. Some drainage, yellow in color, on bandage once unwrapped.     DOS: 06/16/2023 Procedure: 1. Excisional debridement of ulceration to sub cutaneous fat level with prep for graft 1x1x0.2 cm, left 2. Application amniotic graft 1.5 x 1.5 x 0.3 cm, left   3. Transmetatarsal amputation of right foot 4. Tendo achilles lengthening of right foot  83 y.o. female seen for post op check.  Patient is approximately 10 days status post above procedures.  She is doing well.  Denies pain.  Has been at a nursing facility.  Struggling to maintain nonweightbearing on the right lower extremity but is trying to comply and using a knee scooter to get around.  Review of Systems: Negative except as noted in the HPI. Denies N/V/F/Ch.   Objective:   There were no vitals filed for this visit.  There is no height or weight on file to calculate BMI. Constitutional Well developed. Well nourished.  Vascular Foot warm and well perfused. Capillary refill normal to all digits.   No calf pain with palpation  Neurologic Normal speech. Oriented to person, place, and time. Epicritic sensation absent to right foot  Dermatologic TMA site healing well no acute issues no dehiscence erythema or drainage.  Left forefoot wound improving decreased diameter with no evidence of erythema drainage or deep tracking ulceration.       Orthopedic: Status post right foot TMA   Radiographs: Interval transmetatarsal amputation of the right foot  Pathology:  Skin with ulcer and necrotizing inflammation  Micro: C. Striatum  Assessment:   Osteomyelitis of right foot status post transmetatarsal amputation and tendo Achilles lengthening and  ulceration to subcutaneous fat level status postdebridement on the left foot  Plan:  Patient was evaluated and treated and all questions answered.  POD # 10s/p right foot transmetatarsal amputation and tendo Achilles lengthening and left foot ulcer debridement and graft application -Progressing as expected postoperatively without evidence of acute complication amputation site or wound on the left foot -Continue cam boot and try to be nonweightbearing with aid of knee scooter on right lower extremity -XR: Expected postop changes -WB Status: Nonweightbearing in cam boot to right foot and weightbearing as tolerated to left foot in postop shoe -Sutures: To remain intact 1-2 more weeks -Medications/ABX: No further antibiotics indicated status post a course of Augmentin -Dressings changed. Provided instructions for dressing change at a nursing care facility -Follow-up in 2 weeks for suture removal        Corinna Gab, DPM [facility] / [facility]

## 2023-06-30 ENCOUNTER — Encounter: Payer: Self-pay | Admitting: Podiatry

## 2023-07-01 ENCOUNTER — Encounter: Payer: Medicare Other | Admitting: Podiatry

## 2023-07-03 ENCOUNTER — Encounter (HOSPITAL_COMMUNITY): Payer: Self-pay | Admitting: Podiatry

## 2023-07-10 ENCOUNTER — Ambulatory Visit (INDEPENDENT_AMBULATORY_CARE_PROVIDER_SITE_OTHER): Payer: Medicare Other | Admitting: Podiatry

## 2023-07-10 ENCOUNTER — Encounter: Payer: Self-pay | Admitting: Podiatry

## 2023-07-10 DIAGNOSIS — Z89439 Acquired absence of unspecified foot: Secondary | ICD-10-CM

## 2023-07-10 DIAGNOSIS — L97522 Non-pressure chronic ulcer of other part of left foot with fat layer exposed: Secondary | ICD-10-CM

## 2023-07-10 DIAGNOSIS — E11621 Type 2 diabetes mellitus with foot ulcer: Secondary | ICD-10-CM

## 2023-07-10 DIAGNOSIS — Z9889 Other specified postprocedural states: Secondary | ICD-10-CM

## 2023-07-10 NOTE — Progress Notes (Signed)
  Subjective:  Patient ID: Joy Tapia, female    DOB: January 26, 1941,  MRN: [id number]  Chief Complaint  Patient presents with   Routine Post Op    hosp post op - left foot transmet amp dos 06/16/23 -     DOS: 06/16/2023 Procedure: 1. Excisional debridement of ulceration to sub cutaneous fat level with prep for graft 1x1x0.2 cm, left 2. Application amniotic graft 1.5 x 1.5 x 0.3 cm, left   3. Transmetatarsal amputation of right foot 4. Tendo achilles lengthening of right foot  83 y.o. female seen for post op check.  Patient is approximately 3.5 weeks status post above procedures.   She is doing well.  She has been putting some weight down in the cam boot on the right side but does have a knee scooter to help with mobility.  Left foot wound continuing to improve getting wound care at the facility to include an mupirocin ointment followed by Maxorb silver dressing  Review of Systems: Negative except as noted in the HPI. Denies N/V/F/Ch.   Objective:   There were no vitals filed for this visit.  There is no height or weight on file to calculate BMI. Constitutional Well developed. Well nourished.  Vascular Foot warm and well perfused. Capillary refill normal to all digits.   No calf pain with palpation  Neurologic Normal speech. Oriented to person, place, and time. Epicritic sensation absent to right foot  Dermatologic TMA site well-healed with no dehiscence erythema drainage or concern for residual infection.  Left foot small circular ulceration plantar forefoot which is circular and appears to be healing in no significant fibrotic tissues there is some hyperkeratotic tissue buildup around the wound margin.  Healthy wound base with granular tissue.   Orthopedic: Status post right foot TMA and tendo Achilles lengthening   Radiographs: Interval transmetatarsal amputation of the right foot  Pathology:  Skin with ulcer and necrotizing inflammation  Micro: C.  Striatum  Assessment:   Osteomyelitis of right foot status post transmetatarsal amputation and tendo Achilles lengthening and ulceration to subcutaneous fat level status postdebridement on the left foot  Plan:  Patient was evaluated and treated and all questions answered.  3.5 wk s/p right foot transmetatarsal amputation and tendo Achilles lengthening and left foot ulcer debridement and graft application -Progressing as expected postoperatively with good healing of the right foot transmetatarsal amputation site as well as the left foot ulceration - Ok to transition WBAT in post op shoe bilaterally -XR: Expected postop changes from prior xr -WB Status: WBAT BLE in post op shoe -Sutures: Removed staples and sutures from right foot at this visit -Medications/ABX: No further antibiotics indicated status post a course of Augmentin -Dressings changed. Continue Dressings per prior recs to left foot with mupirocin and silver dressing -Follow-up in 4 weeks for wound check on left foot        Corinna Gab, DPM [facility] / [facility]

## 2023-07-15 ENCOUNTER — Encounter: Payer: Medicare Other | Admitting: Podiatry

## 2023-08-04 ENCOUNTER — Inpatient Hospital Stay (HOSPITAL_COMMUNITY)

## 2023-08-04 ENCOUNTER — Emergency Department (HOSPITAL_COMMUNITY)

## 2023-08-04 ENCOUNTER — Other Ambulatory Visit: Payer: Self-pay

## 2023-08-04 ENCOUNTER — Inpatient Hospital Stay (HOSPITAL_COMMUNITY)
Admission: EM | Admit: 2023-08-04 | Discharge: 2023-08-07 | DRG: 638 | Disposition: A | Discharge status: Nursing Home (Includes ICF) | Attending: Family Medicine | Admitting: Family Medicine

## 2023-08-04 ENCOUNTER — Encounter (HOSPITAL_COMMUNITY): Payer: Self-pay | Admitting: Internal Medicine

## 2023-08-04 DIAGNOSIS — Z6822 Body mass index (BMI) 22.0-22.9, adult: Secondary | ICD-10-CM

## 2023-08-04 DIAGNOSIS — E11628 Type 2 diabetes mellitus with other skin complications: Secondary | ICD-10-CM | POA: Diagnosis present

## 2023-08-04 DIAGNOSIS — E440 Moderate protein-calorie malnutrition: Secondary | ICD-10-CM | POA: Insufficient documentation

## 2023-08-04 DIAGNOSIS — I5032 Chronic diastolic (congestive) heart failure: Secondary | ICD-10-CM | POA: Diagnosis present

## 2023-08-04 DIAGNOSIS — R739 Hyperglycemia, unspecified: Principal | ICD-10-CM

## 2023-08-04 DIAGNOSIS — F0390 Unspecified dementia without behavioral disturbance: Secondary | ICD-10-CM | POA: Diagnosis present

## 2023-08-04 DIAGNOSIS — L97419 Non-pressure chronic ulcer of right heel and midfoot with unspecified severity: Secondary | ICD-10-CM | POA: Diagnosis present

## 2023-08-04 DIAGNOSIS — Z79899 Other long term (current) drug therapy: Secondary | ICD-10-CM | POA: Diagnosis not present

## 2023-08-04 DIAGNOSIS — E1159 Type 2 diabetes mellitus with other circulatory complications: Secondary | ICD-10-CM | POA: Diagnosis present

## 2023-08-04 DIAGNOSIS — E871 Hypo-osmolality and hyponatremia: Secondary | ICD-10-CM | POA: Diagnosis present

## 2023-08-04 DIAGNOSIS — E1100 Type 2 diabetes mellitus with hyperosmolarity without nonketotic hyperglycemic-hyperosmolar coma (NKHHC): Secondary | ICD-10-CM

## 2023-08-04 DIAGNOSIS — Z86718 Personal history of other venous thrombosis and embolism: Secondary | ICD-10-CM | POA: Diagnosis not present

## 2023-08-04 DIAGNOSIS — Z794 Long term (current) use of insulin: Secondary | ICD-10-CM | POA: Diagnosis not present

## 2023-08-04 DIAGNOSIS — Z87891 Personal history of nicotine dependence: Secondary | ICD-10-CM

## 2023-08-04 DIAGNOSIS — E1122 Type 2 diabetes mellitus with diabetic chronic kidney disease: Secondary | ICD-10-CM | POA: Diagnosis present

## 2023-08-04 DIAGNOSIS — L97429 Non-pressure chronic ulcer of left heel and midfoot with unspecified severity: Secondary | ICD-10-CM | POA: Diagnosis present

## 2023-08-04 DIAGNOSIS — F423 Hoarding disorder: Secondary | ICD-10-CM | POA: Diagnosis present

## 2023-08-04 DIAGNOSIS — N184 Chronic kidney disease, stage 4 (severe): Secondary | ICD-10-CM | POA: Diagnosis present

## 2023-08-04 DIAGNOSIS — E872 Acidosis, unspecified: Secondary | ICD-10-CM | POA: Diagnosis present

## 2023-08-04 DIAGNOSIS — L03116 Cellulitis of left lower limb: Secondary | ICD-10-CM | POA: Diagnosis present

## 2023-08-04 DIAGNOSIS — G43909 Migraine, unspecified, not intractable, without status migrainosus: Secondary | ICD-10-CM | POA: Diagnosis present

## 2023-08-04 DIAGNOSIS — E860 Dehydration: Secondary | ICD-10-CM | POA: Diagnosis present

## 2023-08-04 DIAGNOSIS — Z96651 Presence of right artificial knee joint: Secondary | ICD-10-CM | POA: Diagnosis present

## 2023-08-04 DIAGNOSIS — Z7982 Long term (current) use of aspirin: Secondary | ICD-10-CM

## 2023-08-04 DIAGNOSIS — L03115 Cellulitis of right lower limb: Secondary | ICD-10-CM | POA: Diagnosis present

## 2023-08-04 DIAGNOSIS — Z881 Allergy status to other antibiotic agents status: Secondary | ICD-10-CM

## 2023-08-04 DIAGNOSIS — E785 Hyperlipidemia, unspecified: Secondary | ICD-10-CM | POA: Diagnosis present

## 2023-08-04 DIAGNOSIS — E11621 Type 2 diabetes mellitus with foot ulcer: Secondary | ICD-10-CM | POA: Diagnosis present

## 2023-08-04 DIAGNOSIS — L97522 Non-pressure chronic ulcer of other part of left foot with fat layer exposed: Secondary | ICD-10-CM

## 2023-08-04 DIAGNOSIS — I130 Hypertensive heart and chronic kidney disease with heart failure and stage 1 through stage 4 chronic kidney disease, or unspecified chronic kidney disease: Secondary | ICD-10-CM | POA: Diagnosis present

## 2023-08-04 DIAGNOSIS — E1169 Type 2 diabetes mellitus with other specified complication: Secondary | ICD-10-CM | POA: Diagnosis present

## 2023-08-04 DIAGNOSIS — L089 Local infection of the skin and subcutaneous tissue, unspecified: Secondary | ICD-10-CM | POA: Diagnosis present

## 2023-08-04 DIAGNOSIS — M810 Age-related osteoporosis without current pathological fracture: Secondary | ICD-10-CM | POA: Diagnosis present

## 2023-08-04 DIAGNOSIS — L0889 Other specified local infections of the skin and subcutaneous tissue: Secondary | ICD-10-CM | POA: Diagnosis present

## 2023-08-04 DIAGNOSIS — K219 Gastro-esophageal reflux disease without esophagitis: Secondary | ICD-10-CM | POA: Diagnosis present

## 2023-08-04 DIAGNOSIS — I10 Essential (primary) hypertension: Secondary | ICD-10-CM | POA: Diagnosis present

## 2023-08-04 DIAGNOSIS — Z8701 Personal history of pneumonia (recurrent): Secondary | ICD-10-CM

## 2023-08-04 DIAGNOSIS — E1165 Type 2 diabetes mellitus with hyperglycemia: Secondary | ICD-10-CM | POA: Diagnosis present

## 2023-08-04 DIAGNOSIS — G3184 Mild cognitive impairment, so stated: Secondary | ICD-10-CM | POA: Diagnosis present

## 2023-08-04 DIAGNOSIS — M109 Gout, unspecified: Secondary | ICD-10-CM | POA: Diagnosis present

## 2023-08-04 DIAGNOSIS — Z91199 Patient's noncompliance with other medical treatment and regimen due to unspecified reason: Secondary | ICD-10-CM

## 2023-08-04 DIAGNOSIS — R1110 Vomiting, unspecified: Secondary | ICD-10-CM | POA: Diagnosis present

## 2023-08-04 DIAGNOSIS — M1A9XX1 Chronic gout, unspecified, with tophus (tophi): Secondary | ICD-10-CM | POA: Diagnosis present

## 2023-08-04 DIAGNOSIS — Z1152 Encounter for screening for COVID-19: Secondary | ICD-10-CM

## 2023-08-04 DIAGNOSIS — Z888 Allergy status to other drugs, medicaments and biological substances status: Secondary | ICD-10-CM

## 2023-08-04 DIAGNOSIS — M159 Polyosteoarthritis, unspecified: Secondary | ICD-10-CM | POA: Diagnosis present

## 2023-08-04 DIAGNOSIS — L97529 Non-pressure chronic ulcer of other part of left foot with unspecified severity: Secondary | ICD-10-CM | POA: Diagnosis present

## 2023-08-04 DIAGNOSIS — Z91048 Other nonmedicinal substance allergy status: Secondary | ICD-10-CM

## 2023-08-04 HISTORY — DX: Unspecified dementia, unspecified severity, without behavioral disturbance, psychotic disturbance, mood disturbance, and anxiety: F03.90

## 2023-08-04 LAB — COMPREHENSIVE METABOLIC PANEL
ALT: 30 U/L (ref 0–44)
ALT: 31 U/L (ref 0–44)
AST: 56 U/L — ABNORMAL HIGH (ref 15–41)
AST: 53 U/L — ABNORMAL HIGH (ref 15–41)
Albumin: 3.4 g/dL — ABNORMAL LOW (ref 3.5–5.0)
Albumin: 3 g/dL — ABNORMAL LOW (ref 3.5–5.0)
Alkaline Phosphatase: 110 U/L (ref 38–126)
Alkaline Phosphatase: 102 U/L (ref 38–126)
Anion gap: 14 (ref 5–15)
Anion gap: 10 (ref 5–15)
BUN: 22 mg/dL (ref 8–23)
BUN: 20 mg/dL (ref 8–23)
CO2: 24 mmol/L (ref 22–32)
CO2: 27 mmol/L (ref 22–32)
Calcium: 9.2 mg/dL (ref 8.9–10.3)
Calcium: 9 mg/dL (ref 8.9–10.3)
Chloride: 93 mmol/L — ABNORMAL LOW (ref 98–111)
Chloride: 99 mmol/L (ref 98–111)
Creatinine, Ser: 1.72 mg/dL — ABNORMAL HIGH (ref 0.44–1.00)
Creatinine, Ser: 1.53 mg/dL — ABNORMAL HIGH (ref 0.44–1.00)
GFR, Estimated: 30 mL/min — ABNORMAL LOW (ref >60)
GFR, Estimated: 34 mL/min — ABNORMAL LOW (ref >60)
Glucose, Bld: 531 mg/dL (ref 70–99)
Glucose, Bld: 306 mg/dL — ABNORMAL HIGH (ref 70–99)
Potassium: 4.4 mmol/L (ref 3.5–5.1)
Potassium: 4 mmol/L (ref 3.5–5.1)
Sodium: 131 mmol/L — ABNORMAL LOW (ref 135–145)
Sodium: 136 mmol/L (ref 135–145)
Total Bilirubin: 0.6 mg/dL (ref 0.0–1.2)
Total Bilirubin: 0.6 mg/dL (ref 0.0–1.2)
Total Protein: 6.8 g/dL (ref 6.5–8.1)
Total Protein: 6.3 g/dL — ABNORMAL LOW (ref 6.5–8.1)

## 2023-08-04 LAB — URINALYSIS, ROUTINE W REFLEX MICROSCOPIC
Bacteria, UA: NONE SEEN
Bilirubin Urine: NEGATIVE
Glucose, UA: >=500 mg/dL — AB
Hgb urine dipstick: NEGATIVE
Ketones, ur: NEGATIVE mg/dL
Leukocytes,Ua: NEGATIVE
Nitrite: NEGATIVE
Protein, ur: 30 mg/dL — AB
Specific Gravity, Urine: 1.005 (ref 1.005–1.030)
pH: 7 (ref 5.0–8.0)

## 2023-08-04 LAB — I-STAT CHEM 8, ED
BUN: 27 mg/dL — ABNORMAL HIGH (ref 8–23)
Calcium, Ion: 1.11 mmol/L — ABNORMAL LOW (ref 1.15–1.40)
Chloride: 93 mmol/L — ABNORMAL LOW (ref 98–111)
Creatinine, Ser: 1.8 mg/dL — ABNORMAL HIGH (ref 0.44–1.00)
Glucose, Bld: 542 mg/dL (ref 70–99)
HCT: 34 % — ABNORMAL LOW (ref 36.0–46.0)
Hemoglobin: 11.6 g/dL — ABNORMAL LOW (ref 12.0–15.0)
Potassium: 4.8 mmol/L (ref 3.5–5.1)
Sodium: 130 mmol/L — ABNORMAL LOW (ref 135–145)
TCO2: 27 mmol/L (ref 22–32)

## 2023-08-04 LAB — I-STAT CG4 LACTIC ACID, ED
Lactic Acid, Venous: 2.9 mmol/L (ref 0.5–1.9)
Lactic Acid, Venous: 3 mmol/L (ref 0.5–1.9)
Lactic Acid, Venous: 2.2 mmol/L (ref 0.5–1.9)

## 2023-08-04 LAB — CBC
HCT: 35.5 % — ABNORMAL LOW (ref 36.0–46.0)
Hemoglobin: 11.9 g/dL — ABNORMAL LOW (ref 12.0–15.0)
MCH: 28.5 pg (ref 26.0–34.0)
MCHC: 33.5 g/dL (ref 30.0–36.0)
MCV: 84.9 fL (ref 80.0–100.0)
Platelets: 266 10*3/uL (ref 150–400)
RBC: 4.18 MIL/uL (ref 3.87–5.11)
RDW: 14.7 % (ref 11.5–15.5)
WBC: 10.5 10*3/uL (ref 4.0–10.5)
nRBC: 0 % (ref 0.0–0.2)

## 2023-08-04 LAB — RESP PANEL BY RT-PCR (RSV, FLU A&B, COVID)  RVPGX2
Influenza A by PCR: NEGATIVE
Influenza B by PCR: NEGATIVE
Resp Syncytial Virus by PCR: NEGATIVE
SARS Coronavirus 2 by RT PCR: NEGATIVE

## 2023-08-04 LAB — GLUCOSE, CAPILLARY
Glucose-Capillary: 228 mg/dL — ABNORMAL HIGH (ref 70–99)
Glucose-Capillary: 298 mg/dL — ABNORMAL HIGH (ref 70–99)

## 2023-08-04 LAB — SEDIMENTATION RATE: Sed Rate: 41 mm/h — ABNORMAL HIGH (ref 0–22)

## 2023-08-04 LAB — CBG MONITORING, ED
Glucose-Capillary: 493 mg/dL — ABNORMAL HIGH (ref 70–99)
Glucose-Capillary: 423 mg/dL — ABNORMAL HIGH (ref 70–99)
Glucose-Capillary: 294 mg/dL — ABNORMAL HIGH (ref 70–99)
Glucose-Capillary: 261 mg/dL — ABNORMAL HIGH (ref 70–99)

## 2023-08-04 LAB — LACTIC ACID, PLASMA: Lactic Acid, Venous: 2.8 mmol/L (ref 0.5–1.9)

## 2023-08-04 LAB — PREALBUMIN: Prealbumin: 22 mg/dL (ref 18–38)

## 2023-08-04 LAB — C-REACTIVE PROTEIN: CRP: 0.7 mg/dL (ref <1.0)

## 2023-08-04 MED ORDER — SODIUM CHLORIDE 0.45 % IV SOLN: INTRAVENOUS | Status: DC

## 2023-08-04 MED ORDER — SODIUM CHLORIDE 0.9 % IV SOLN
3.0000 g | Freq: Two times a day (BID) | INTRAVENOUS | Status: DC
  Administered 2023-08-04 – 2023-08-07 (×7): 3 g via INTRAVENOUS
  Filled 2023-08-04 (×7): qty 8

## 2023-08-04 MED ORDER — ONDANSETRON HCL 4 MG PO TABS: 4.0000 mg | ORAL_TABLET | Freq: Four times a day (QID) | ORAL | Status: DC | PRN

## 2023-08-04 MED ORDER — INSULIN ASPART 100 UNIT/ML IJ SOLN
2.0000 [IU] | Freq: Once | INTRAMUSCULAR | Status: AC
  Administered 2023-08-04: 2 [IU] via SUBCUTANEOUS

## 2023-08-04 MED ORDER — SODIUM CHLORIDE 0.9 % IV BOLUS
500.0000 mL | Freq: Once | INTRAVENOUS | Status: AC
  Administered 2023-08-04: 500 mL via INTRAVENOUS

## 2023-08-04 MED ORDER — LINEZOLID 600 MG/300ML IV SOLN
600.0000 mg | Freq: Two times a day (BID) | INTRAVENOUS | Status: DC
  Administered 2023-08-04 – 2023-08-05 (×3): 600 mg via INTRAVENOUS
  Filled 2023-08-04 (×4): qty 300

## 2023-08-04 MED ORDER — INSULIN ASPART 100 UNIT/ML IJ SOLN
0.0000 [IU] | Freq: Three times a day (TID) | INTRAMUSCULAR | Status: DC
  Administered 2023-08-04: 5 [IU] via SUBCUTANEOUS
  Administered 2023-08-05: 3 [IU] via SUBCUTANEOUS
  Administered 2023-08-05: 9 [IU] via SUBCUTANEOUS
  Administered 2023-08-05: 3 [IU] via SUBCUTANEOUS
  Filled 2023-08-04: qty 0.09

## 2023-08-04 MED ORDER — SODIUM CHLORIDE 0.9 % IV SOLN
2.0000 g | Freq: Once | INTRAVENOUS | Status: AC
  Administered 2023-08-04: 2 g via INTRAVENOUS
  Filled 2023-08-04: qty 20

## 2023-08-04 MED ORDER — INSULIN ASPART 100 UNIT/ML IJ SOLN
8.0000 [IU] | Freq: Once | INTRAMUSCULAR | Status: AC
  Administered 2023-08-04: 8 [IU] via SUBCUTANEOUS
  Filled 2023-08-04: qty 0.08

## 2023-08-04 MED ORDER — ACETAMINOPHEN 650 MG RE SUPP: 650.0000 mg | Freq: Four times a day (QID) | RECTAL | Status: DC | PRN

## 2023-08-04 MED ORDER — ONDANSETRON HCL 4 MG/2ML IJ SOLN
4.0000 mg | Freq: Four times a day (QID) | INTRAMUSCULAR | Status: DC | PRN
  Administered 2023-08-05 (×3): 4 mg via INTRAVENOUS
  Filled 2023-08-04 (×3): qty 2

## 2023-08-04 MED ORDER — GENTAMICIN SULFATE 0.1 % EX OINT: TOPICAL_OINTMENT | CUTANEOUS | Status: DC

## 2023-08-04 MED ORDER — METRONIDAZOLE 500 MG/100ML IV SOLN
500.0000 mg | Freq: Once | INTRAVENOUS | Status: AC
  Administered 2023-08-04: 500 mg via INTRAVENOUS
  Filled 2023-08-04: qty 100

## 2023-08-04 MED ORDER — SALINE SPRAY 0.65 % NA SOLN
1.0000 | NASAL | Status: DC | PRN
  Administered 2023-08-04: 1 via NASAL
  Filled 2023-08-04: qty 44

## 2023-08-04 MED ORDER — ACETAMINOPHEN 325 MG PO TABS
650.0000 mg | ORAL_TABLET | Freq: Four times a day (QID) | ORAL | Status: DC | PRN
  Administered 2023-08-04 – 2023-08-06 (×4): 650 mg via ORAL
  Filled 2023-08-04 (×4): qty 2

## 2023-08-04 MED ORDER — MELATONIN 5 MG PO TABS
5.0000 mg | ORAL_TABLET | Freq: Once | ORAL | Status: AC
  Administered 2023-08-04: 5 mg via ORAL
  Filled 2023-08-04: qty 1

## 2023-08-04 MED ORDER — OXYCODONE HCL 5 MG PO TABS
2.5000 mg | ORAL_TABLET | Freq: Once | ORAL | Status: AC | PRN
  Administered 2023-08-05: 2.5 mg via ORAL
  Filled 2023-08-04: qty 1

## 2023-08-04 MED ORDER — VANCOMYCIN HCL IN DEXTROSE 1-5 GM/200ML-% IV SOLN
1000.0000 mg | Freq: Once | INTRAVENOUS | Status: AC
  Administered 2023-08-04: 1000 mg via INTRAVENOUS
  Filled 2023-08-04: qty 200

## 2023-08-04 MED ORDER — ENOXAPARIN SODIUM 30 MG/0.3ML IJ SOSY
30.0000 mg | PREFILLED_SYRINGE | Freq: Every day | INTRAMUSCULAR | Status: DC
  Administered 2023-08-04 – 2023-08-06 (×3): 30 mg via SUBCUTANEOUS
  Filled 2023-08-04 (×3): qty 0.3

## 2023-08-04 NOTE — ED Provider Notes (Signed)
 [place] EMERGENCY DEPARTMENT AT [facility] Provider Note   CSN: [id number] Arrival date & time: 08/04/23  7829     History  Chief Complaint  Patient presents with   Hyperglycemia    Joy Tapia is a 83 y.o. female.  83 year old female with prior medical history as detailed below presents for evaluation.  Patient is a resident at [facility].  Patient with notable hyperglycemia at her facility.  Patient is compliant with previously prescribed insulin.  Patient without complaint of pain, nausea, shortness of breath.  Recent history significant for diabetes mellitus type 2, hypertension, CKD stage IV, hyperlipidemia, mild cognitive impairment.  On recent admission patient presented secondary to a progressively worsening wound infection with evidence of osteomyelitis requiring transmetatarsal foot amputation on Right (1/20).   The history is provided by the patient and medical records.       Home Medications Prior to Admission medications   Medication Sig Start Date End Date Taking? Authorizing Provider  acetaminophen (TYLENOL) 325 MG tablet Take 650 mg by mouth every 8 (eight) hours as needed.    [provider]  albuterol (VENTOLIN HFA) 108 (90 Base) MCG/ACT inhaler Inhale into the lungs. 05/07/23   [provider]  aspirin EC 81 MG tablet Take 81 mg by mouth daily.    [provider]  cetirizine (ZYRTEC) 10 MG tablet Take 10 mg by mouth daily.    [provider]  Febuxostat 80 MG TABS Take 1 tablet by mouth daily.    [provider]  fluticasone (FLONASE) 50 MCG/ACT nasal spray Place 1 spray into both nostrils daily as needed for allergies or rhinitis.    [provider]  furosemide (LASIX) 40 MG tablet Take 1 tablet (40 mg total) by mouth daily. 04/29/23   Regalado, Belkys A, MD  insulin detemir (LEVEMIR) 100 UNIT/ML injection Inject 0.06 mLs (6 Units total) into the skin daily. 06/19/23   Jerald Kief, MD  Insulin Syringe-Needle U-100 (INSULIN SYRINGE .5CC/28G) 28G X 1/2" 0.5 ML MISC 1 Application by Does not apply route daily. 04/29/23   Regalado, Belkys A, MD  metoprolol succinate (TOPROL-XL) 50 MG 24 hr tablet Take 50 mg by mouth daily.    [provider]  Multiple Vitamin (MULTIVITAMIN) capsule Take 1 capsule by mouth daily.    [provider]  mupirocin ointment (BACTROBAN) 2 % Apply 1 Application topically 2 (two) times daily. 06/05/23   Barbaraann Share, DPM  omeprazole (PRILOSEC) 20 MG capsule Take 20 mg by mouth daily.    [provider]  oxyCODONE (OXY IR/ROXICODONE) 5 MG immediate release tablet Take 1 tablet (5 mg total) by mouth every 6 (six) hours as needed for moderate pain (pain score 4-6). 06/19/23   Jerald Kief, MD  pravastatin (PRAVACHOL) 20 MG tablet Take 20 mg by mouth at bedtime.    [provider]      Allergies    Atorvastatin, Azithromycin, Erythromycin, Other, and Propoxyphene    Review of Systems   Review of Systems  Unable to perform ROS: Other    Physical Exam Updated Vital Signs BP (!) 148/63 (BP Location: Right Arm)   Pulse 82   Temp 97.9 F (36.6 C) (Oral)   Resp 18   SpO2 100%  Physical Exam Vitals and nursing note reviewed.  Constitutional:      General: She is not in acute distress.    Appearance: Normal appearance. She is well-developed.  HENT:  Head: Normocephalic and atraumatic.  Eyes:     Conjunctiva/sclera: Conjunctivae normal.     Pupils: Pupils are equal, round, and reactive to light.  Cardiovascular:     Rate and Rhythm: Normal rate and regular rhythm.     Heart sounds: Normal heart sounds.  Pulmonary:     Effort: Pulmonary effort is normal. No respiratory distress.     Breath sounds: Normal breath sounds.  Abdominal:     General: There is no distension.     Palpations: Abdomen is soft.     Tenderness: There is no abdominal tenderness.  Musculoskeletal:        General: No deformity.  Normal range of motion.     Cervical back: Normal range of motion and neck supple.     Comments: Status post transmetatarsal amputation of right foot.  See images below.  Skin:    General: Skin is warm and dry.  Neurological:     General: No focal deficit present.     Mental Status: She is alert and oriented to person, place, and time.        ED Results / Procedures / Treatments   Labs (all labs ordered are listed, but only abnormal results are displayed) Labs Reviewed  CBC - Abnormal; Notable for the following components:      Result Value   Hemoglobin 11.9 (*)    HCT 35.5 (*)    All other components within normal limits  COMPREHENSIVE METABOLIC PANEL - Abnormal; Notable for the following components:   Sodium 131 (*)    Chloride 93 (*)    Glucose, Bld 531 (*)    Creatinine, Ser 1.72 (*)    Albumin 3.4 (*)    AST 56 (*)    GFR, Estimated 30 (*)    All other components within normal limits  CBG MONITORING, ED - Abnormal; Notable for the following components:   Glucose-Capillary 493 (*)    All other components within normal limits  I-STAT CHEM 8, ED - Abnormal; Notable for the following components:   Sodium 130 (*)    Chloride 93 (*)    BUN 27 (*)    Creatinine, Ser 1.80 (*)    Glucose, Bld 542 (*)    Calcium, Ion 1.11 (*)    Hemoglobin 11.6 (*)    HCT 34.0 (*)    All other components within normal limits  I-STAT CG4 LACTIC ACID, ED - Abnormal; Notable for the following components:   Lactic Acid, Venous 2.9 (*)    All other components within normal limits  RESP PANEL BY RT-PCR (RSV, FLU A&B, COVID)  [id number]  CULTURE, BLOOD (ROUTINE X 2)  CULTURE, BLOOD (ROUTINE X 2)  URINALYSIS, ROUTINE W REFLEX MICROSCOPIC  CBG MONITORING, ED    EKG EKG Interpretation Date/Time:  Monday August 04 2023 11:03:02 EDT Ventricular Rate:  80 PR Interval:  135 QRS Duration:  93 QT Interval:  391 QTC Calculation: 451 R Axis:   146  Text Interpretation: Right and left arm  electrode reversal, interpretation assumes no reversal Sinus rhythm Consider right atrial enlargement Inferior infarct, old Lateral leads are also involved Confirmed by Kristine Royal [phone]) on 08/04/2023 11:04:36 AM  Radiology No results found.  Procedures Procedures    Medications Ordered in ED Medications  cefTRIAXone (ROCEPHIN) 2 g in sodium chloride 0.9 % 100 mL IVPB (has no administration in time range)  metroNIDAZOLE (FLAGYL) IVPB 500 mg (has no administration in time range)  vancomycin (VANCOCIN) IVPB 1000 mg/200  mL premix (has no administration in time range)  insulin aspart (novoLOG) injection 8 Units (8 Units Subcutaneous Given 08/04/23 1041)  sodium chloride 0.9 % bolus 500 mL (500 mLs Intravenous New Bag/Given 08/04/23 1101)    ED Course/ Medical Decision Making/ A&P                                 Medical Decision Making Amount and/or Complexity of Data Reviewed Labs: ordered. Radiology: ordered.  Risk Prescription drug management. Decision regarding hospitalization.    Medical Screen Complete  This patient presented to the ED with complaint of hyperglycemia.  This complaint involves an extensive number of treatment options. The initial differential diagnosis includes, but is not limited to, metabolic abnormality, infection, etc.  This presentation is: Acute, Chronic, Self-Limited, Previously Undiagnosed, Uncertain Prognosis, Complicated, Systemic Symptoms, and Threat to Life/Bodily Function  Patient with elevated BG.   Exam concerning for likely DM foot infection (right).   Abx administered in the ED.   Patient requires admission for workup and treatment.   Hospitalist service aware of case.   Co morbidities that complicated the patient's evaluation  See HPI   Additional history obtained: External records from outside sources obtained and reviewed including prior ED visits and prior Inpatient records.    Problem List / ED  Course:  Hyperglycemia, concern for DM foot infection   Disposition:  After consideration of the diagnostic results and the patients response to treatment, I feel that the patent would benefit from admission.          Final Clinical Impression(s) / ED Diagnoses Final diagnoses:  Hyperglycemia  Diabetic foot infection (HCC)    Rx / DC Orders ED Discharge Orders     None         Wynetta Fines, MD 08/04/23 [phone]

## 2023-08-04 NOTE — Progress Notes (Signed)
 Pharmacy Antibiotic Note  Joy Tapia is a 83 y.o. female admitted on 08/04/2023 with  wound infection .  Pharmacy has been consulted for Unasyn dosing.  No weight entered in ED on this visit. Used Weight: 58.1 kg (128 lb) from visit on 04/27/23. CrCl is 22 ml/min   Plan: Unasyn 3 gr IV q12h  Linezolid 600 mg IV q12h per MD      Temp (24hrs), Avg:97.8 F (36.6 C), Min:97.6 F (36.4 C), Max:97.9 F (36.6 C)  Recent Labs  Lab 08/04/23 0952 08/04/23 0958 08/04/23 1104 08/04/23 1334  WBC 10.5  --   --   --   CREATININE 1.72* 1.80*  --   --   LATICACIDVEN  --   --  2.9* 3.0*    CrCl cannot be calculated (Unknown ideal weight.).    Allergies  Allergen Reactions   Atorvastatin Diarrhea    Not listed on the MAR   Azithromycin Other (See Comments)    Pt states burns her insides.  Not listed on the [facility]    Erythromycin Other (See Comments)    Not listed on the [facility]    Other     Other Reaction(s): Unknown Adhesive bandage  Anesthesia Extension set  -  unknown  Not listed on the [facility]    Propoxyphene Other (See Comments)    Not listed on the [facility]     Antimicrobials this admission: 3/10 ceftriaxone  x 1  3/10 metronidazole x 1  3/10 vancomycin x 1  3/10 Unasyn >>  3/10 linezolid >>   Dose adjustments this admission:   Microbiology results: 3/10 BCx:      Adalberto Cole, PharmD, BCPS 08/04/2023 2:10 PM
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
## 2023-08-04 NOTE — ED Notes (Signed)
 I stat chem 8 given to Estell Harpin MD and I-Li  RN.

## 2023-08-04 NOTE — ED Triage Notes (Signed)
 Pt BIBA from [facility] for cbg this morning of 586, usu in 200s. Took insulin last night as usual. Pt has no complaints. A&Ox4, ambulatory. Has chronic foot problem.   126/78 HR 84 98% RA

## 2023-08-04 NOTE — ED Notes (Signed)
 Attempted to get urine from pt. Hats not available in the bathroom and pt had already started going. Unable to get sample, will attempt again later. No urine odor noted.

## 2023-08-04 NOTE — ED Notes (Signed)
 Lactic result given to [facility]

## 2023-08-04 NOTE — Consult Note (Signed)
 PODIATRY CONSULTATION  NAME Joy Tapia MRN [id number] DOB 1941-03-16 DOA 08/04/2023   Reason for consult:  Chief Complaint  Patient presents with   Hyperglycemia    Attending/Consulting physician: D. Robb Matar MD  History of present illness: 83 y.o. female who was brought in by ambulance from nursing facility after her BG was found to be 586. She has history DM2, CKD, HTN. Also with dementia. Per report from family wounds have been getting good wound care at her facility.  She is not able to give much history due to mental status. She had TMA on right foot and L foot wound debridement 06/16/23. Last Followed up 2/13 and everything noted to be healing well.   Past Medical History:  Diagnosis Date   Deep vein thrombosis (DVT) (HCC)    Diabetes mellitus without complication (HCC)    Foot fracture, right    GERD (gastroesophageal reflux disease)    Gout    Hoarding disorder    Hyperlipidemia    Hypertension    Hypertension in stage 4 chronic kidney disease due to type 2 diabetes mellitus (HCC)    Migraines    Mild cognitive impairment    Non-compliance    Osteoarthritis    Osteoporosis, post-menopausal    Pneumonia        Latest Ref Rng & Units 08/04/2023    9:58 AM 08/04/2023    9:52 AM 06/19/2023    7:02 AM  CBC  WBC 4.0 - 10.5 K/uL  10.5  9.5   Hemoglobin 12.0 - 15.0 g/dL 28.4  13.2  9.7   Hematocrit 36.0 - 46.0 % 34.0  35.5  29.1   Platelets 150 - 400 K/uL  266  235        Latest Ref Rng & Units 08/04/2023    9:58 AM 08/04/2023    9:52 AM 06/19/2023    7:02 AM  BMP  Glucose 70 - 99 mg/dL 440  102  725   BUN 8 - 23 mg/dL 27  22  11    Creatinine 0.44 - 1.00 mg/dL 3.66  4.40  3.47   Sodium 135 - 145 mmol/L 130  131  139   Potassium 3.5 - 5.1 mmol/L 4.8  4.4  3.5   Chloride 98 - 111 mmol/L 93  93  105   CO2 22 - 32 mmol/L  24  25   Calcium 8.9 - 10.3 mg/dL  9.2  8.2       Physical Exam: Lower Extremity Exam Right heel with superficial serous bilster that  has ruptured, no active drainage, mal odor or significant erythema surrounding.     Left midfoot ulceration with fibrotic tissued in wound base, no acute erythema, appears stable from prior exam    DP ant PT pulses palpable bilaterally Sensation diminished bilaterally S/p R foot TMA  ASSESSMENT/PLAN OF CARE 83 y.o. female with PMHx significant for   DM2, HTN, CKD 4.  with right heel ruptured serous blister and mild cellulitis and left foot chronic midfoot ulceration  WBC: 10.5 BG: 423 ESR/CRP: pending  XR L Foot: Pending  XR R FOOT: 1. Interval first through fifth metatarsal amputation to the proximal shafts. 2. The first through fifth proximal metatarsal shaft amputation stumps appear sharp. No definitive erosion is seen. 3- Possible soft tissue lucency/gas seen on AP favored to represent induration of the surgical incision  as no evidence emphysema on lateral   - No surgical plans. Recommend local wound care at this time.  R heel recommend cleanse foot with betadine, then apply aquacel AG over the blister area and wrap with kerlix roll. - Left foot ulcer recommend XR to assess for deep infection though overall wound looks stable from prior. Recommend every other day dressing with getamicin ointment, aquacel AG, gauze dressing - Continue IV abx broad spectrum pending further culture data - Anticoagulation: ok to continue per primary - Wound care: As above - WB status: WBAT in post op shoe bilateral - Will continue to follow   Thank you for the consult.  Please contact me directly with any questions or concerns.           Corinna Gab, DPM [facility] / [facility]    [address] [place], [place] [address]                Office [phone]  Fax [phone]
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
## 2023-08-04 NOTE — Progress Notes (Signed)
 Pharmacy Note   A consult was received from an ED physician for vancomycin per pharmacy dosing.    The patient's profile has been reviewed for ht/wt/allergies/indication/available labs.    No weight entered on this ED visit. Used Weight: 58.1 kg (128 lb) from visit on 04/27/23   A one time order has been placed for vancomycin 1000 mg IV  x1 .    Further antibiotics/pharmacy consults should be ordered by admitting physician if indicated.                       Thank you,  Adalberto Cole, PharmD, BCPS 08/04/2023 10:56 AM

## 2023-08-05 DIAGNOSIS — E440 Moderate protein-calorie malnutrition: Secondary | ICD-10-CM | POA: Insufficient documentation

## 2023-08-05 DIAGNOSIS — E1100 Type 2 diabetes mellitus with hyperosmolarity without nonketotic hyperglycemic-hyperosmolar coma (NKHHC): Secondary | ICD-10-CM | POA: Diagnosis not present

## 2023-08-05 LAB — COMPREHENSIVE METABOLIC PANEL
ALT: 25 U/L (ref 0–44)
AST: 46 U/L — ABNORMAL HIGH (ref 15–41)
Albumin: 2.8 g/dL — ABNORMAL LOW (ref 3.5–5.0)
Alkaline Phosphatase: 89 U/L (ref 38–126)
Anion gap: 12 (ref 5–15)
BUN: 17 mg/dL (ref 8–23)
CO2: 23 mmol/L (ref 22–32)
Calcium: 8.6 mg/dL — ABNORMAL LOW (ref 8.9–10.3)
Chloride: 101 mmol/L (ref 98–111)
Creatinine, Ser: 1.44 mg/dL — ABNORMAL HIGH (ref 0.44–1.00)
GFR, Estimated: 37 mL/min — ABNORMAL LOW (ref >60)
Glucose, Bld: 220 mg/dL — ABNORMAL HIGH (ref 70–99)
Potassium: 3.5 mmol/L (ref 3.5–5.1)
Sodium: 136 mmol/L (ref 135–145)
Total Bilirubin: 0.6 mg/dL (ref 0.0–1.2)
Total Protein: 5.6 g/dL — ABNORMAL LOW (ref 6.5–8.1)

## 2023-08-05 LAB — CBC WITH DIFFERENTIAL/PLATELET
Abs Immature Granulocytes: 0.04 10*3/uL (ref 0.00–0.07)
Basophils Absolute: 0 10*3/uL (ref 0.0–0.1)
Basophils Relative: 0 %
Eosinophils Absolute: 0.4 10*3/uL (ref 0.0–0.5)
Eosinophils Relative: 4 %
HCT: 33.1 % — ABNORMAL LOW (ref 36.0–46.0)
Hemoglobin: 10.7 g/dL — ABNORMAL LOW (ref 12.0–15.0)
Immature Granulocytes: 0 %
Lymphocytes Relative: 20 %
Lymphs Abs: 1.9 10*3/uL (ref 0.7–4.0)
MCH: 28.7 pg (ref 26.0–34.0)
MCHC: 32.3 g/dL (ref 30.0–36.0)
MCV: 88.7 fL (ref 80.0–100.0)
Monocytes Absolute: 0.4 10*3/uL (ref 0.1–1.0)
Monocytes Relative: 5 %
Neutro Abs: 6.8 10*3/uL (ref 1.7–7.7)
Neutrophils Relative %: 71 %
Platelets: 226 10*3/uL (ref 150–400)
RBC: 3.73 MIL/uL — ABNORMAL LOW (ref 3.87–5.11)
RDW: 14.9 % (ref 11.5–15.5)
WBC: 9.6 10*3/uL (ref 4.0–10.5)
nRBC: 0 % (ref 0.0–0.2)

## 2023-08-05 LAB — GLUCOSE, CAPILLARY
Glucose-Capillary: 259 mg/dL — ABNORMAL HIGH (ref 70–99)
Glucose-Capillary: 239 mg/dL — ABNORMAL HIGH (ref 70–99)
Glucose-Capillary: 352 mg/dL — ABNORMAL HIGH (ref 70–99)
Glucose-Capillary: 242 mg/dL — ABNORMAL HIGH (ref 70–99)
Glucose-Capillary: 221 mg/dL — ABNORMAL HIGH (ref 70–99)

## 2023-08-05 LAB — C DIFFICILE QUICK SCREEN W PCR REFLEX
C Diff antigen: POSITIVE — AB
C Diff toxin: NEGATIVE

## 2023-08-05 LAB — CLOSTRIDIUM DIFFICILE BY PCR, REFLEXED: Toxigenic C. Difficile by PCR: NEGATIVE

## 2023-08-05 LAB — LACTIC ACID, PLASMA: Lactic Acid, Venous: 2.4 mmol/L (ref 0.5–1.9)

## 2023-08-05 MED ORDER — HYDRALAZINE HCL 20 MG/ML IJ SOLN: 5.0000 mg | Freq: Four times a day (QID) | INTRAMUSCULAR | Status: DC | PRN

## 2023-08-05 MED ORDER — METOPROLOL SUCCINATE ER 50 MG PO TB24
50.0000 mg | ORAL_TABLET | Freq: Every day | ORAL | Status: DC
  Administered 2023-08-05 – 2023-08-07 (×3): 50 mg via ORAL
  Filled 2023-08-05 (×3): qty 1

## 2023-08-05 MED ORDER — PRAVASTATIN SODIUM 20 MG PO TABS
20.0000 mg | ORAL_TABLET | Freq: Every day | ORAL | Status: DC
Start: 2023-08-05 — End: 2023-08-07
  Administered 2023-08-05 – 2023-08-06 (×2): 20 mg via ORAL
  Filled 2023-08-05 (×2): qty 1

## 2023-08-05 MED ORDER — JUVEN PO PACK
1.0000 | PACK | Freq: Two times a day (BID) | ORAL | Status: DC
  Administered 2023-08-05 – 2023-08-07 (×4): 1 via ORAL
  Filled 2023-08-05 (×4): qty 1

## 2023-08-05 MED ORDER — INSULIN GLARGINE-YFGN 100 UNIT/ML ~~LOC~~ SOLN: 6.0000 [IU] | Freq: Every day | SUBCUTANEOUS | Status: DC

## 2023-08-05 MED ORDER — IOHEXOL 300 MG/ML  SOLN: 30.0000 mL | Freq: Once | INTRAMUSCULAR | Status: DC | PRN

## 2023-08-05 MED ORDER — GENTAMICIN SULFATE 0.1 % EX OINT
TOPICAL_OINTMENT | CUTANEOUS | Status: DC
  Filled 2023-08-05: qty 15

## 2023-08-05 MED ORDER — INSULIN GLARGINE 100 UNIT/ML ~~LOC~~ SOLN
6.0000 [IU] | Freq: Every day | SUBCUTANEOUS | Status: DC
  Administered 2023-08-06 – 2023-08-07 (×2): 6 [IU] via SUBCUTANEOUS
  Filled 2023-08-05 (×2): qty 0.06

## 2023-08-05 MED ORDER — ENSURE MAX PROTEIN PO LIQD
11.0000 [oz_av] | Freq: Two times a day (BID) | ORAL | Status: DC
  Administered 2023-08-06: 11 [oz_av] via ORAL
  Filled 2023-08-05 (×5): qty 330

## 2023-08-05 MED ORDER — OXYCODONE HCL 5 MG PO TABS
5.0000 mg | ORAL_TABLET | Freq: Four times a day (QID) | ORAL | Status: DC | PRN
  Administered 2023-08-06 (×2): 5 mg via ORAL
  Filled 2023-08-05 (×2): qty 1

## 2023-08-05 MED ORDER — INSULIN ASPART 100 UNIT/ML IJ SOLN
0.0000 [IU] | Freq: Three times a day (TID) | INTRAMUSCULAR | Status: DC
  Administered 2023-08-06: 5 [IU] via SUBCUTANEOUS
  Administered 2023-08-06: 8 [IU] via SUBCUTANEOUS
  Administered 2023-08-06: 3 [IU] via SUBCUTANEOUS
  Administered 2023-08-07: 8 [IU] via SUBCUTANEOUS
  Administered 2023-08-07: 5 [IU] via SUBCUTANEOUS

## 2023-08-05 MED ORDER — ASPIRIN 81 MG PO TBEC
81.0000 mg | DELAYED_RELEASE_TABLET | Freq: Every day | ORAL | Status: DC
  Administered 2023-08-05 – 2023-08-07 (×3): 81 mg via ORAL
  Filled 2023-08-05 (×3): qty 1

## 2023-08-05 MED ORDER — LORATADINE 10 MG PO TABS
10.0000 mg | ORAL_TABLET | Freq: Every day | ORAL | Status: DC | PRN
  Administered 2023-08-06 (×2): 10 mg via ORAL
  Filled 2023-08-05 (×2): qty 1

## 2023-08-05 MED ORDER — ADULT MULTIVITAMIN W/MINERALS CH
1.0000 | ORAL_TABLET | Freq: Every day | ORAL | Status: DC
  Administered 2023-08-06 – 2023-08-07 (×2): 1 via ORAL
  Filled 2023-08-05 (×2): qty 1

## 2023-08-05 MED ORDER — INSULIN ASPART 100 UNIT/ML IJ SOLN
2.0000 [IU] | Freq: Once | INTRAMUSCULAR | Status: AC
  Administered 2023-08-05: 2 [IU] via SUBCUTANEOUS

## 2023-08-05 MED ORDER — INSULIN ASPART 100 UNIT/ML IJ SOLN
0.0000 [IU] | Freq: Every day | INTRAMUSCULAR | Status: DC
  Administered 2023-08-05: 2 [IU] via SUBCUTANEOUS

## 2023-08-05 NOTE — Progress Notes (Signed)
  Progress Note   Patient: Joy Tapia:[id number] DOB: Dec 24, 1940 DOA: 08/04/2023     1 DOS: the patient was seen and examined on 08/05/2023 at 9:27AM      Brief hospital course: 83 y.o. F with MCI, lives in LTC, DM, hx DVT, CKD IV baseline 1.4-1.9, and recent right TMA for osteomyelitis who presented with hyperglycemia, generalized malaise.  Found at facility to have glucose 586 mg/dL, sent to the ER.  In the ER, anion gap normal, lactate elevated, WBC normal.  Right foot x-rays showed no evidence of osteo.  Given fluids antibiotics and insulin and admitted for HHS. DPM consulted.      Assessment and Plan: HHS Type 2 diabetes with hyperglycemia Still hyperglycemic today but better - Continue SS corrections, increase dose - Resume glargine - Resume aspirin   Diabetic foot infection Recent right TMA The feet do not appear to have significant infection today. - Consult Podiatry - Continue linezolid, Unasyn - Continue nutrition supplements  Abdominal pain, vomiting - CT abdomen and pelvis ordered  Gout - Hold febuxostat  Hypertension BP elevated - Resume metoprolol - PRN hdyralazine - Hold furosemide  Chronic diastolic CHF Appears dehydrated - Hold furosemide  Hyperlipidemia - Resume pravastatin  Chronic kidney disease stage IV Cr stable relative to baseline 1.4-1.9  Dementia vs MCI - Delirium precautions          Subjective: Feeling malaise all day.  Vomited repeatedly.  No fever.  No focal abdominal pain.       Physical Exam: BP (!) 188/76 (BP Location: Right Arm)   Pulse 94   Temp 98.7 F (37.1 C) (Oral)   Resp 16   Ht 5\' 2"  (1.575 m)   Wt 56.5 kg   SpO2 97%   BMI 22.78 kg/m   Weak appearing adult female, lying in bed, appears sick and tired Tachycardic, regular, no pitting edema, oropharynx tacky dry Respiratory rate normal, lungs clear without rales or wheezes Abdomen soft, no tenderness palpation or  guarding Attention diminished, affect blunted, judgment and insight appear impaired but at baseline, face symmetric, speech fluent, severe generalized weakness    Data Reviewed: Basic metabolic panel shows creatinine 1.4, LFTs normal, lactic acid elevated CBC shows no leukocytosis, mild anemia  Family Communication: None present    Disposition: Status is: Inpatient The patient was admitted with hyperglycemia, nonfocal signs, blood cultures are pending  I am uncertain what is the source of her malaise which does not appear to be better with bringing her blood sugars down   Will increase insulins, obtain CT abdomen and pelvis, continue antibiotics        Author: Alberteen Sam, MD 08/05/2023 6:18 PM  For on call review [email or web address].

## 2023-08-05 NOTE — Progress Notes (Signed)
 Initial Nutrition Assessment  DOCUMENTATION CODES:   Non-severe (moderate) malnutrition in context of chronic illness  INTERVENTION:   -Ensure MAX Protein po BID, each supplement provides 150 kcal and 30 grams of protein   -Multivitamin with minerals daily  -1 packet Juven BID, each packet provides 95 calories, 2.5 grams of protein (collagen), and 9.8 grams of carbohydrate (3 grams sugar); also contains 7 grams of L-arginine and L-glutamine, 300 mg vitamin C, 15 mg vitamin E, 1.2 mcg vitamin B-12, 9.5 mg zinc, 200 mg calcium, and 1.5 g  Calcium Beta-hydroxy-Beta-methylbutyrate to support wound healing   -Placed patient on meal order with assist, not ordering meals consistently  NUTRITION DIAGNOSIS:   Moderate Malnutrition related to chronic illness as evidenced by moderate fat depletion, moderate muscle depletion.  GOAL:   Patient will meet greater than or equal to 90% of their needs  MONITOR:   PO intake, Supplement acceptance  REASON FOR ASSESSMENT:   Consult Wound healing  ASSESSMENT:   83 y.o. female with medical history significant of DVT, type 2 diabetes, right foot fracture, GERD,, hoarding disorder, hypertension, stage IV CKD, migraines, mild cognitive impairment, noncompliance with treatment, osteoarthritis, osteoporosis, history of pneumonia who underwent a right transmetatarsal foot amputation on 06/16/2023 due to progressively worse wound infection with evidence of osteomyelitis who is sent from her facility due to hyperglycemia of 586 mg/dL.  Patient in room, reports she feels really bad today. Has not ordered any meals today as she feels too bad. Brought pt a cup of soda over ice for her to sip on with some graham crackers. Pt willing to drink protein shake later once nausea more under control.   Pt kept closing her eyes during interaction and didn't provide much history regarding PO intakes PTA.  Per H&P, pt was eating a liberalized diet at facility. Will order  Ensure Max, daily MVI and Juven to aid in wound healing.   Per weight records, no significant weight changes PTA.  Medications: Zofran  Labs reviewed: CBGs: 228-352   NUTRITION - FOCUSED PHYSICAL EXAM:  Flowsheet Row Most Recent Value  Orbital Region Moderate depletion  Upper Arm Region Moderate depletion  Thoracic and Lumbar Region Moderate depletion  Buccal Region Moderate depletion  Temple Region Moderate depletion  Clavicle Bone Region Moderate depletion  Clavicle and Acromion Bone Region Moderate depletion  Scapular Bone Region Moderate depletion  Dorsal Hand Moderate depletion  Patellar Region Mild depletion  Anterior Thigh Region Mild depletion  Posterior Calf Region Mild depletion  Edema (RD Assessment) Mild  Hair Reviewed  Eyes Reviewed  Mouth Reviewed  Skin Reviewed  Nails Reviewed  [broken]       Diet Order:   Diet Order             Diet heart healthy/carb modified Room service appropriate? Yes; Fluid consistency: Thin  Diet effective now                   EDUCATION NEEDS:   Not appropriate for education at this time  Skin:  Skin Assessment: Skin Integrity Issues: Skin Integrity Issues:: Diabetic Ulcer, Other (Comment) Diabetic Ulcer: left foot Other: rt heel blister  Last BM:  3/11 -type 7  Height:   Ht Readings from Last 1 Encounters:  08/04/23 5\' 2"  (1.575 m)    Weight:   Wt Readings from Last 1 Encounters:  08/04/23 56.5 kg    BMI:  Body mass index is 22.78 kg/m.  Estimated Nutritional Needs:   Kcal:  1610-9604  Protein:  75-85g  Fluid:  1.6L/day  Tilda Franco, MS, RD, LDN Inpatient Clinical Dietitian Contact via Secure chat

## 2023-08-05 NOTE — Progress Notes (Signed)
 PHARMACY NOTE -  Unasyn  Pharmacy has been assisting with dosing of Unasyn for wound infection. Dosage remains stable at 3g IV q12 hr and further renal adjustments per institutional Pharmacy antibiotic protocol (unlikely as baseline CrCl is below adjustment threshold)  Pharmacy will sign off, following peripherally for culture results, dose adjustments, and length of therapy. Please reconsult if a change in clinical status warrants re-evaluation of dosage.  Bernadene Person, PharmD, BCPS [phone] 08/05/2023, 12:40 PM

## 2023-08-05 NOTE — Progress Notes (Signed)
   PODIATRY PROGRESS NOTE Patient Name: Joy Tapia  DOB Sep 18, 1940 DOA 08/04/2023  Hospital Day: 2  Assessment:  83 y.o. female with PMHx significant for   DM2, HTN, CKD 4.  with right heel ruptured serous blister and mild cellulitis and left foot chronic midfoot ulceration   WBC: 9.6  ESR/CRP: 41/0.7  XR L Foot:  1. Irregularity and possible soft tissue ulceration of the plantar foot at the level of the proximal shafts of the metatarsals on lateral view. No definite cortical erosion is seen. 2. Old healed fractures of the distal fourth and fifth metatarsal shafts. 3. Mild-to-moderate posterior calcaneal heel spur at the Achilles tendon insertion.   XR R FOOT: 1. Interval first through fifth metatarsal amputation to the proximal shafts. 2. The first through fifth proximal metatarsal shaft amputation stumps appear sharp. No definitive erosion is seen. 3- Possible soft tissue lucency/gas seen on AP favored to represent induration of the surgical incision  as no evidence emphysema on lateral   Plan:  - No surgical plans. Continue local wound care per orders placed, modified today - Follow up in office in 2 weeks for wound check - Recommend wound care center referral on discharge as well - Recommend short course 7 days PO abx on DC, doxycycline  - WBAT to both feet in post op shoes which pateint has -Will sign off please message with questions or concerns thanks        Corinna Gab, DPM [facility]    Subjective:  Pt seen bedside denies pain this am in either foot. No concerns.   Objective:   Vitals:   08/05/23 0117 08/05/23 0516  BP: 136/63 (!) 140/57  Pulse: 67 71  Resp: 20 20  Temp: 97.7 F (36.5 C) 98.5 F (36.9 C)  SpO2: 94% 100%       Latest Ref Rng & Units 08/05/2023    4:18 AM 08/04/2023    9:58 AM 08/04/2023    9:52 AM  CBC  WBC 4.0 - 10.5 K/uL 9.6   10.5   Hemoglobin 12.0 - 15.0 g/dL 16.1  09.6  04.5   Hematocrit 36.0 - 46.0 %  33.1  34.0  35.5   Platelets 150 - 400 K/uL 226   266        Latest Ref Rng & Units 08/05/2023    4:18 AM 08/04/2023    2:38 PM 08/04/2023    9:58 AM  BMP  Glucose 70 - 99 mg/dL 409  811  914   BUN 8 - 23 mg/dL 17  20  27    Creatinine 0.44 - 1.00 mg/dL 7.82  9.56  2.13   Sodium 135 - 145 mmol/L 136  136  130   Potassium 3.5 - 5.1 mmol/L 3.5  4.0  4.8   Chloride 98 - 111 mmol/L 101  99  93   CO2 22 - 32 mmol/L 23  27    Calcium 8.9 - 10.3 mg/dL 8.6  9.0      General: AAOx3, NAD  Lower Extremity Exam Left foot ulcer appears improved, less fibrotic tissue in wound bed. No evidence of infeciton  Right heel with eschar at site of prior ruptured serous blister no drainage or maceration, healing.          Radiology:  Results reviewed. See assessment for pertinent imaging results

## 2023-08-05 NOTE — Plan of Care (Signed)

## 2023-08-05 NOTE — Inpatient Diabetes Management (Signed)
 Inpatient Diabetes Program Recommendations  AACE/ADA: New Consensus Statement on Inpatient Glycemic Control (2015)  Target Ranges:  Prepandial:   less than 140 mg/dL      Peak postprandial:   less than 180 mg/dL (1-2 hours)      Critically ill patients:  140 - 180 mg/dL   Lab Results  Component Value Date   GLUCAP 352 (H) 08/05/2023   HGBA1C 9.2 (H) 06/13/2023    Review of Glycemic Control  Latest Reference Range & Units 08/04/23 09:44 08/04/23 11:38 08/04/23 14:11 08/04/23 16:10 08/04/23 17:19 08/04/23 21:45 08/05/23 01:19 08/05/23 07:44 08/05/23 11:51  Glucose-Capillary 70 - 99 mg/dL 914 (H) 782 (H) 956 (H) 261 (H) 228 (H) 298 (H) 259 (H) 239 (H) 352 (H)  (H): Data is abnormally high  Diabetes history: DM2 Outpatient Diabetes medications: Levemir 6 units every day, Amaryl 4 mg QD Current orders for Inpatient glycemic control: Novolog 0-9 units TID  Inpatient Diabetes Program Recommendations:    Met with patient briefly at bedside.  She does not know what she takes for her DM at her ALF.  She states the staff administer her medications.    Please consider:  Semglee 6 units every day Novolog 3 units TID with meals if she consumes at least 50%  Her discharge note from 06/18/22 has her taking Levemir 6 units every day and Amaryl 4 mg every day.  She had been taking Levemir 12 units prior to that admission.    A1C 9.2%  Will continue to follow while inpatient.  Thank you, Dulce Sellar, MSN, CDCES Diabetes Coordinator Inpatient Diabetes Program [phone] (team pager from 8a-5p)

## 2023-08-06 ENCOUNTER — Inpatient Hospital Stay (HOSPITAL_COMMUNITY)

## 2023-08-06 ENCOUNTER — Encounter (HOSPITAL_COMMUNITY): Payer: Self-pay

## 2023-08-06 DIAGNOSIS — E1100 Type 2 diabetes mellitus with hyperosmolarity without nonketotic hyperglycemic-hyperosmolar coma (NKHHC): Secondary | ICD-10-CM | POA: Diagnosis not present

## 2023-08-06 LAB — GLUCOSE, CAPILLARY
Glucose-Capillary: 274 mg/dL — ABNORMAL HIGH (ref 70–99)
Glucose-Capillary: 186 mg/dL — ABNORMAL HIGH (ref 70–99)
Glucose-Capillary: 231 mg/dL — ABNORMAL HIGH (ref 70–99)
Glucose-Capillary: 184 mg/dL — ABNORMAL HIGH (ref 70–99)

## 2023-08-06 LAB — COMPREHENSIVE METABOLIC PANEL
ALT: 30 U/L (ref 0–44)
AST: 75 U/L — ABNORMAL HIGH (ref 15–41)
Albumin: 2.8 g/dL — ABNORMAL LOW (ref 3.5–5.0)
Alkaline Phosphatase: 84 U/L (ref 38–126)
Anion gap: 11 (ref 5–15)
BUN: 18 mg/dL (ref 8–23)
CO2: 21 mmol/L — ABNORMAL LOW (ref 22–32)
Calcium: 8.1 mg/dL — ABNORMAL LOW (ref 8.9–10.3)
Chloride: 99 mmol/L (ref 98–111)
Creatinine, Ser: 1.37 mg/dL — ABNORMAL HIGH (ref 0.44–1.00)
GFR, Estimated: 39 mL/min — ABNORMAL LOW (ref >60)
Glucose, Bld: 351 mg/dL — ABNORMAL HIGH (ref 70–99)
Potassium: 3.8 mmol/L (ref 3.5–5.1)
Sodium: 131 mmol/L — ABNORMAL LOW (ref 135–145)
Total Bilirubin: 0.5 mg/dL (ref 0.0–1.2)
Total Protein: 5.6 g/dL — ABNORMAL LOW (ref 6.5–8.1)

## 2023-08-06 MED ORDER — LINEZOLID 600 MG PO TABS
600.0000 mg | ORAL_TABLET | Freq: Two times a day (BID) | ORAL | Status: DC
  Administered 2023-08-06 – 2023-08-07 (×3): 600 mg via ORAL
  Filled 2023-08-06 (×3): qty 1

## 2023-08-06 MED ORDER — IOHEXOL 300 MG/ML  SOLN
75.0000 mL | Freq: Once | INTRAMUSCULAR | Status: AC | PRN
  Administered 2023-08-06: 75 mL via INTRAVENOUS

## 2023-08-06 NOTE — Progress Notes (Signed)
 PHARMACIST - PHYSICIAN COMMUNICATION DR:   Idelle Leech CONCERNING: Antibiotic IV to Oral Route Change Policy  RECOMMENDATION: This patient is receiving Linezolid by the intravenous route.  Based on criteria approved by the Pharmacy and Therapeutics Committee, the antibiotic(s) is/are being converted to the equivalent oral dose form(s).   DESCRIPTION: These criteria include: Patient being treated for a respiratory tract infection, urinary tract infection, cellulitis or clostridium difficile associated diarrhea if on metronidazole The patient is not neutropenic and does not exhibit a GI malabsorption state The patient is eating (either orally or via tube) and/or has been taking other orally administered medications for a least 24 hours The patient is improving clinically and has a Tmax < 100.5  If you have questions about this conversion, please contact the Pharmacy Department  []   [phone] )  [facility] []   [phone] )  [facility] []   [phone] )  [facility] []   [phone] )  [facility] [x]   [phone] )  Ilene Qua    Celedonio Miyamoto, PharmD, [place] Clinical Pharmacist

## 2023-08-06 NOTE — Evaluation (Signed)
 Physical Therapy Evaluation Patient Details Name: Joy Tapia MRN: [id number] DOB: [birth year] Today's Date: 08/06/2023  History of Present Illness  83 yo female admitted with hyperglycemic state, LE cellulitis. 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 She walked around in room with RW. She took some steps in bathroom without device. Mod Ind with toileting hygiene. Able to stand and wash hands without difficulty. Assisted pt into recliner end of session to finish breakfast. Recommend return to facility once medically stable. Could possibly benefit from HHPT consult if pt is agreeable.         If plan is discharge home, recommend the following: A little help with walking and/or transfers;A little help with bathing/dressing/bathroom;Assistance with cooking/housework;Assist for transportation;Help with stairs or ramp for entrance   Can travel by private vehicle        Equipment Recommendations None recommended by PT  Recommendations for Other Services       Functional Status Assessment Patient has had a recent decline in their functional status and demonstrates the ability to make significant improvements in function in a reasonable and predictable amount of time.     Precautions / Restrictions Precautions Precautions: Fall Restrictions Weight Bearing Restrictions Per Provider Order: No      Mobility  Bed Mobility Overal bed mobility: Modified Independent                  Transfers Overall transfer level: Needs assistance Equipment used: Rolling walker (2 wheels) Transfers: Sit to/from Stand Sit to Stand: Min assist, Supervision           General transfer comment: Min A to safely rise from bed (floor + post op shoes= slippery). Supv to stand from toilet. Cues for safety.    Ambulation/Gait Ambulation/Gait assistance:  Supervision Gait Distance (Feet): 20 Feet Assistive device: Rolling walker (2 wheels) Gait Pattern/deviations: Step-through pattern, Decreased stride length       General Gait Details: Supv for safe ambulation in room with RW. No LOB with RW use. Pt also able to walk around in bathroom without device.  Stairs            Wheelchair Mobility     Tilt Bed    Modified Rankin (Stroke Patients Only)       Balance Overall balance assessment: Needs assistance         Standing balance support: During functional activity Standing balance-Leahy Scale: Fair                               Pertinent Vitals/Pain Pain Assessment Pain Assessment: Faces Faces Pain Scale: Hurts little more Pain Location: R foot Pain Descriptors / Indicators: Discomfort Pain Intervention(s): Limited activity within patient's tolerance, Monitored during session, Repositioned    Home Living Family/patient expects to be discharged to:: Assisted living                 Home Equipment: Agricultural consultant (2 wheels);Cane - single point;Crutches;Shower seat;Other (comment);Hand held shower head      Prior Function Prior Level of Function : Needs assist             Mobility Comments: uses RW PRN ADLs Comments: mod ind with ADLs     Extremity/Trunk Assessment   Upper Extremity Assessment Upper Extremity Assessment: Defer to OT evaluation  Lower Extremity Assessment Lower Extremity Assessment: Generalized weakness (R foot transmet amp)    Cervical / Trunk Assessment Cervical / Trunk Assessment: Normal  Communication   Communication Communication: No apparent difficulties    Cognition Arousal: Alert Behavior During Therapy: WFL for tasks assessed/performed   PT - Cognitive impairments: History of cognitive impairments                         Following commands: Intact       Cueing Cueing Techniques: Verbal cues     General Comments       Exercises     Assessment/Plan    PT Assessment Patient needs continued PT services  PT Problem List Decreased strength;Decreased range of motion;Decreased activity tolerance;Decreased balance;Decreased mobility;Decreased knowledge of use of DME;Pain       PT Treatment Interventions DME instruction;Gait training;Functional mobility training;Therapeutic activities;Therapeutic exercise;Patient/family education;Balance training    PT Goals (Current goals can be found in the Care Plan section)  Acute Rehab PT Goals Patient Stated Goal: none stated PT Goal Formulation: With patient Time For Goal Achievement: 08/20/23 Potential to Achieve Goals: Good    Frequency Min 2X/week     Co-evaluation               AM-PAC PT "6 Clicks" Mobility  Outcome Measure Help needed turning from your back to your side while in a flat bed without using bedrails?: None Help needed moving from lying on your back to sitting on the side of a flat bed without using bedrails?: None Help needed moving to and from a bed to a chair (including a wheelchair)?: None Help needed standing up from a chair using your arms (e.g., wheelchair or bedside chair)?: None Help needed to walk in hospital room?: A Little Help needed climbing 3-5 steps with a railing? : A Little 6 Click Score: 22    End of Session   Activity Tolerance: Patient tolerated treatment well Patient left: in chair;with call bell/phone within reach;with chair alarm set   PT Visit Diagnosis: Difficulty in walking, not elsewhere classified (R26.2);Muscle weakness (generalized) (M62.81)    Time: 5784-6962 PT Time Calculation (min) (ACUTE ONLY): 20 min   Charges:   PT Evaluation $PT Eval Low Complexity: 1 Low   PT General Charges $$ ACUTE PT VISIT: 1 Visit            Faye Ramsay, PT Acute Rehabilitation  Office: [phone]

## 2023-08-06 NOTE — Progress Notes (Signed)
 Brief Nutrition Note  Received consult for assessment of nutrition requirements/status.  Patient assessed by RD yesterday, see note 3/11 for recommendations.  RD will follow up with patient per protocol.   Shelle Iron RD, LDN Contact via [employer].

## 2023-08-06 NOTE — Progress Notes (Addendum)
 Patient alert, oriented to self. Place time and situation fluctuate, worsening in the evening and night  (more confused and poor safety awareness.) Patient up in chair today, slightly improved appetite, no emesis today. Diarrhea continues, encouraging hydration and frequent reorientation. Spoke with POA by phone, patient was able to speak with POA as well.

## 2023-08-06 NOTE — Progress Notes (Signed)
 PROGRESS NOTE    WINNELL BENTO  ZOX:[id number] DOB: [birth year] DOA: 08/04/2023 PCP: [facility]   Brief Narrative: This 83 yrs old Female with MCI, lives in LTC, DM, hx. DVT, CKD IV with baseline 1.4-1.9, and recent right TMA for osteomyelitis who presented with hyperglycemia, generalized malaise. She is found at facility to have glucose 586 mg/dL, sent to the ER. In the ER, anion gap normal, lactate elevated, WBC normal.  Right foot x-rays showed no evidence of osteomyelitis. Given fluids,  antibiotics and insulin and admitted for HHS. DPM consulted. No plan for any surgical intervention. Follow up Outpatient and oral antibiotics for 7 days.  Assessment & Plan:   Principal Problem:   Hyperosmolar hyperglycemic state (HHS) (HCC) Active Problems:   Diabetic foot infection (HCC)   Hyperlipidemia associated with type 2 diabetes mellitus (HCC)   Mild cognitive impairment   CKD (chronic kidney disease) stage 4, GFR 15-29 ml/min (HCC)   HTN (hypertension)   Gout   Cellulitis of both lower extremities   Malnutrition of moderate degree   HHS Type 2 diabetes with hyperglycemia Blood glucose still hyperglycemic but better Continue SS correction, increase dose Continue Lantus 6 units daily Continue Aspirin   Diabetic foot infection Recent right TMA: The feet does not appear to have significant infection today. Podiatry consulted, no plans for any surgical intervention. Recommended outpatient follow-up in 2 weeks. Can be discharged on oral doxycycline. Continue linezolid, Unasyn Continue nutrition supplements   Abdominal pain, vomiting CT abdomen shows possible enteritis,  no bowel obstruction. C. difficile antigen positive, toxin negative. Continue supportive care   Gout: Hold febuxostat.   Hypertension BP elevated Continue metoprolol. PRN hdyralazine Hold furosemide   Chronic diastolic CHF: Appears dehydrated. Hold furosemide    Hyperlipidemia: Resume pravastatin   Chronic kidney disease stage IV Cr stable relative to baseline 1.4-1.9   Dementia vs MCI Delirium precautions   DVT prophylaxis: Lovenox Code Status:Full code Family Communication: No family at bed side. Disposition Plan:   Status is: Inpatient Remains inpatient appropriate because:  Severity of illness.    Consultants:  Podiatry  Procedures: None  Antimicrobials:  Anti-infectives (From admission, onward)    Start     Dose/Rate Route Frequency Ordered Stop   08/06/23 1000  linezolid (ZYVOX) tablet 600 mg        600 mg Oral Every 12 hours 08/06/23 0851 08/11/23 0959   08/04/23 1415  linezolid (ZYVOX) IVPB 600 mg  Status:  Discontinued       Placed in "And" Linked Group   600 mg 300 mL/hr over 60 Minutes Intravenous Every 12 hours 08/04/23 1406 08/06/23 0851   08/04/23 1415  Ampicillin-Sulbactam (UNASYN) 3 g in sodium chloride 0.9 % 100 mL IVPB        3 g 200 mL/hr over 30 Minutes Intravenous Every 12 hours 08/04/23 1413     08/04/23 1100  vancomycin (VANCOCIN) IVPB 1000 mg/200 mL premix        1,000 mg 200 mL/hr over 60 Minutes Intravenous  Once 08/04/23 1056 08/04/23 1426   08/04/23 1045  cefTRIAXone (ROCEPHIN) 2 g in sodium chloride 0.9 % 100 mL IVPB        2 g 200 mL/hr over 30 Minutes Intravenous  Once 08/04/23 1045 08/04/23 1208   08/04/23 1045  metroNIDAZOLE (FLAGYL) IVPB 500 mg        500 mg 100 mL/hr over 60 Minutes Intravenous  Once 08/04/23 1045 08/04/23 1311  Subjective: Patient was seen and examined at bedside, overnight events noted. Patient states that she is feeling better. She has developed diarrhea, renal functions are improving.  Objective: Vitals:   08/05/23 0117 08/05/23 0516 08/05/23 1237 08/05/23 2259  BP: 136/63 (!) 140/57 (!) 188/76 (!) 161/66  Pulse: 67 71 94 (!) 103  Resp: 20 20 16 16   Temp: 97.7 F (36.5 C) 98.5 F (36.9 C) 98.7 F (37.1 C) 100.2 F (37.9 C)  TempSrc: Oral Oral Oral  Oral  SpO2: 94% 100% 97% 100%  Weight:      Height:        Intake/Output Summary (Last 24 hours) at 08/06/2023 1325 Last data filed at 08/06/2023 0945 Gross per 24 hour  Intake 1805.6 ml  Output --  Net 1805.6 ml   Filed Weights   08/04/23 1708  Weight: 56.5 kg    Examination:  General exam: Appears calm and comfortable, deconditioned, Not in distress.  Respiratory system: CTA Bilaterally. Respiratory effort normal. RR 16 Cardiovascular system: S1 & S2 heard, RRR. No JVD, murmurs, rubs, gallops or clicks.  Gastrointestinal system: Abdomen is non distended, soft and non tender. Normal bowel sounds heard. Central nervous system: Alert and oriented x 2. No focal neurological deficits. Extremities: No edema, no cyanosis, No clubbing  Skin: No rashes, lesions or ulcers Psychiatry: Judgement and insight appear normal. Mood & affect appropriate.     Data Reviewed: I have personally reviewed following labs and imaging studies  CBC: Recent Labs  Lab 08/04/23 0952 08/04/23 0958 08/05/23 0418  WBC 10.5  --  9.6  NEUTROABS  --   --  6.8  HGB 11.9* 11.6* 10.7*  HCT 35.5* 34.0* 33.1*  MCV 84.9  --  88.7  PLT 266  --  226   Basic Metabolic Panel: Recent Labs  Lab 08/04/23 0952 08/04/23 0958 08/04/23 1438 08/05/23 0418 08/06/23 0423  NA 131* 130* 136 136 131*  K 4.4 4.8 4.0 3.5 3.8  CL 93* 93* 99 101 99  CO2 24  --  27 23 21*  GLUCOSE 531* 542* 306* 220* 351*  BUN 22 27* 20 17 18   CREATININE 1.72* 1.80* 1.53* 1.44* 1.37*  CALCIUM 9.2  --  9.0 8.6* 8.1*   GFR: Estimated Creatinine Clearance: 25.5 mL/min (A) (by C-G formula based on SCr of 1.37 mg/dL (H)). Liver Function Tests: Recent Labs  Lab 08/04/23 0952 08/04/23 1438 08/05/23 0418 08/06/23 0423  AST 56* 53* 46* 75*  ALT 30 31 25 30   ALKPHOS 110 102 89 84  BILITOT 0.6 0.6 0.6 0.5  PROT 6.8 6.3* 5.6* 5.6*  ALBUMIN 3.4* 3.0* 2.8* 2.8*   No results for input(s): "LIPASE", "AMYLASE" in the last 168  hours. No results for input(s): "AMMONIA" in the last 168 hours. Coagulation Profile: No results for input(s): "INR", "PROTIME" in the last 168 hours. Cardiac Enzymes: No results for input(s): "CKTOTAL", "CKMB", "CKMBINDEX", "TROPONINI" in the last 168 hours. BNP (last 3 results) No results for input(s): "PROBNP" in the last 8760 hours. HbA1C: No results for input(s): "HGBA1C" in the last 72 hours. CBG: Recent Labs  Lab 08/05/23 1151 08/05/23 1621 08/05/23 2208 08/06/23 0754 08/06/23 1202  GLUCAP 352* 242* 221* 274* 186*   Lipid Profile: No results for input(s): "CHOL", "HDL", "LDLCALC", "TRIG", "CHOLHDL", "LDLDIRECT" in the last 72 hours. Thyroid Function Tests: No results for input(s): "TSH", "T4TOTAL", "FREET4", "T3FREE", "THYROIDAB" in the last 72 hours. Anemia Panel: No results for input(s): "VITAMINB12", "FOLATE", "FERRITIN", "TIBC", "IRON", "  RETICCTPCT" in the last 72 hours. Sepsis Labs: Recent Labs  Lab 08/04/23 1334 08/04/23 1600 08/04/23 1924 08/05/23 0418  LATICACIDVEN 3.0* 2.2* 2.8* 2.4*    Recent Results (from the past 240 hours)  Resp panel by RT-PCR (RSV, Flu A&B, Covid) Anterior Nasal Swab     Status: None   Collection Time: 08/04/23 10:32 AM   Specimen: Anterior Nasal Swab  Result Value Ref Range Status   SARS Coronavirus 2 by RT PCR NEGATIVE NEGATIVE Final    Comment: (NOTE) SARS-CoV-2 target nucleic acids are NOT DETECTED.  The SARS-CoV-2 RNA is generally detectable in upper respiratory specimens during the acute phase of infection. The lowest concentration of SARS-CoV-2 viral copies this assay can detect is 138 copies/mL. A negative result does not preclude SARS-Cov-2 infection and should not be used as the sole basis for treatment or other patient management decisions. A negative result may occur with  improper specimen collection/handling, submission of specimen other than nasopharyngeal swab, presence of viral mutation(s) within the areas  targeted by this assay, and inadequate number of viral copies(<138 copies/mL). A negative result must be combined with clinical observations, patient history, and epidemiological information. The expected result is Negative.  Fact Sheet for Patients:  BloggerCourse.com  Fact Sheet for Healthcare Providers:  SeriousBroker.it  This test is no t yet approved or cleared by the [place] FDA and  has been authorized for detection and/or diagnosis of SARS-CoV-2 by FDA under an Emergency Use Authorization (EUA). This EUA will remain  in effect (meaning this test can be used) for the duration of the COVID-19 declaration under Section 564(b)(1) of the Act, 21 U.S.C.section 360bbb-3(b)(1), unless the authorization is terminated  or revoked sooner.       Influenza A by PCR NEGATIVE NEGATIVE Final   Influenza B by PCR NEGATIVE NEGATIVE Final    Comment: (NOTE) The Xpert Xpress SARS-CoV-2/FLU/RSV plus assay is intended as an aid in the diagnosis of influenza from Nasopharyngeal swab specimens and should not be used as a sole basis for treatment. Nasal washings and aspirates are unacceptable for Xpert Xpress SARS-CoV-2/FLU/RSV testing.  Fact Sheet for Patients: BloggerCourse.com  Fact Sheet for Healthcare Providers: SeriousBroker.it  This test is not yet approved or cleared by the [place] FDA and has been authorized for detection and/or diagnosis of SARS-CoV-2 by FDA under an Emergency Use Authorization (EUA). This EUA will remain in effect (meaning this test can be used) for the duration of the COVID-19 declaration under Section 564(b)(1) of the Act, 21 U.S.C. section 360bbb-3(b)(1), unless the authorization is terminated or revoked.     Resp Syncytial Virus by PCR NEGATIVE NEGATIVE Final    Comment: (NOTE) Fact Sheet for  Patients: BloggerCourse.com  Fact Sheet for Healthcare Providers: SeriousBroker.it  This test is not yet approved or cleared by the [place] FDA and has been authorized for detection and/or diagnosis of SARS-CoV-2 by FDA under an Emergency Use Authorization (EUA). This EUA will remain in effect (meaning this test can be used) for the duration of the COVID-19 declaration under Section 564(b)(1) of the Act, 21 U.S.C. section 360bbb-3(b)(1), unless the authorization is terminated or revoked.  Performed at [facility], [address] [place], [place] [address]   Culture, blood (routine x 2)     Status: None (Preliminary result)   Collection Time: 08/04/23 11:28 AM   Specimen: BLOOD RIGHT ARM  Result Value Ref Range Status   Specimen Description   Final    BLOOD  RIGHT ARM Performed at [facility], [address], [place], [place] [address]    Special Requests   Final    BOTTLES DRAWN AEROBIC AND ANAEROBIC Blood Culture adequate volume Performed at [facility], [address], [place], [place] [address]    Culture   Final    NO GROWTH 2 DAYS Performed at [facility], [address], [place], [place] [address]    Report Status PENDING  Incomplete  Culture, blood (routine x 2)     Status: None (Preliminary result)   Collection Time: 08/04/23 11:36 AM   Specimen: BLOOD LEFT HAND  Result Value Ref Range Status   Specimen Description   Final    BLOOD LEFT HAND Performed at [facility], [address], [place], [place] [address]    Special Requests   Final    AEROBIC BOTTLE ONLY Blood Culture adequate volume Performed at [facility], [address] [place], [place] [address]    Culture   Final    NO GROWTH 2 DAYS Performed at [facility], [address], [place], [place] [address]    Report Status PENDING  Incomplete  C Difficile Quick  Screen w PCR reflex     Status: Abnormal   Collection Time: 08/05/23  6:04 PM   Specimen: STOOL  Result Value Ref Range Status   C Diff antigen POSITIVE (A) NEGATIVE Final   C Diff toxin NEGATIVE NEGATIVE Final   C Diff interpretation Results are indeterminate. See PCR results.  Final    Comment: Performed at [facility], [address], [place], [place] [address]  C. Diff by PCR, Reflexed     Status: None   Collection Time: 08/05/23  6:04 PM  Result Value Ref Range Status   Toxigenic C. Difficile by PCR NEGATIVE NEGATIVE Final    Comment: Patient is colonized with non toxigenic C. difficile. May not need treatment unless significant symptoms are present. Performed at [facility], [address], [place], [place] [address]     Radiology Studies: CT ABDOMEN PELVIS W CONTRAST Result Date: 08/06/2023 CLINICAL DATA:  Bowel obstruction suspected. EXAM: CT ABDOMEN AND PELVIS WITH CONTRAST TECHNIQUE: Multidetector CT imaging of the abdomen and pelvis was performed using the standard protocol following bolus administration of intravenous contrast. RADIATION DOSE REDUCTION: This exam was performed according to the departmental dose-optimization program which includes automated exposure control, adjustment of the mA and/or kV according to patient size and/or use of iterative reconstruction technique. CONTRAST:  75mL OMNIPAQUE IOHEXOL 300 MG/ML  SOLN COMPARISON:  None Available. FINDINGS: Lower chest: There is mild eventration of the right hemidiaphragm with minimal right lung base linear atelectasis. The visualized left lung base is clear. There is coronary vascular calcification. No intra-abdominal free air.  Small free fluid in the pelvis. Hepatobiliary: Fatty liver. No biliary dilatation. Cholecystectomy. No retained calcified stone noted in the central CBD. Pancreas: Unremarkable. No pancreatic ductal dilatation or surrounding inflammatory changes. Spleen: Normal in size  without focal abnormality. Adrenals/Urinary Tract: The adrenal glands are unremarkable. Mild bilateral renal parenchyma atrophy and cortical scarring. There is no hydronephrosis on either side. Subcentimeter bilateral renal hypodense lesions are too small to characterize. The visualized ureters are unremarkable. The urinary bladder is collapsed. Stomach/Bowel: There is mild thickened appearance of a segmental proximal jejunum in the upper abdomen with mild haziness of the associated mesentery which may represent enteritis. Clinical correlation is recommended.  There is loose stool throughout the colon consistent with diarrheal state. Correlation with stool cultures recommended. There is no bowel obstruction. The appendix is normal. Vascular/Lymphatic: Mild aortoiliac atherosclerotic disease. The IVC is unremarkable. No portal venous gas. There is no adenopathy. Reproductive: Small calcified uterine fibroid. No suspicious adnexal masses. Other: None Musculoskeletal: Osteopenia with degenerative changes and scoliosis. No acute osseous pathology. IMPRESSION: 1. Diarrheal state with possible enteritis. Clinical correlation is recommended. No bowel obstruction. Normal appendix. 2. Fatty liver. Electronically Signed   By: Elgie Collard M.D.   On: 08/06/2023 11:09   DG Foot Complete Left Result Date: 08/04/2023 CLINICAL DATA:  Ulcer on left foot. EXAM: LEFT FOOT - COMPLETE 3+ VIEW COMPARISON:  Left foot radiographs 06/05/2023 FINDINGS: There is diffuse decreased bone mineralization. There is osseous fusion of the great toe interphalangeal joint and the fifth DIP joint. Old healed fracture of the distal shaft of the fifth metatarsal with mild lateral apex angulation, unchanged from prior. Old healed fracture of the distal fourth metatarsal shaft with minimal medial displacement of the distal fracture component, unchanged. Mild-to-moderate posterior calcaneal heel spur at the Achilles tendon insertion. Moderate  interphalangeal joint space narrowing diffusely. Mild talonavicular navicular-cuneiform joint space narrowing. Irregularity and possible ulceration of the plantar foot at the level of the proximal shafts of the metatarsals on lateral view. No definite cortical erosion is seen. No acute fracture or dislocation. IMPRESSION: 1. Irregularity and possible soft tissue ulceration of the plantar foot at the level of the proximal shafts of the metatarsals on lateral view. No definite cortical erosion is seen. 2. Old healed fractures of the distal fourth and fifth metatarsal shafts. 3. Mild-to-moderate posterior calcaneal heel spur at the Achilles tendon insertion. Electronically Signed   By: Neita Garnet M.D.   On: 08/04/2023 16:09   Scheduled Meds:  aspirin EC  81 mg Oral Daily   enoxaparin (LOVENOX) injection  30 mg Subcutaneous QHS   gentamicin ointment   Topical QODAY   insulin aspart  0-15 Units Subcutaneous TID WC   insulin aspart  0-5 Units Subcutaneous QHS   insulin glargine  6 Units Subcutaneous Daily   linezolid  600 mg Oral Q12H   metoprolol succinate  50 mg Oral Daily   multivitamin with minerals  1 tablet Oral Daily   nutrition supplement (JUVEN)  1 packet Oral BID BM   pravastatin  20 mg Oral QHS   Ensure Max Protein  11 oz Oral BID   Continuous Infusions:  ampicillin-sulbactam (UNASYN) IV 3 g (08/06/23 1016)     LOS: 2 days    Time spent: 50 mins    Willeen Niece, MD [facility]   If 7PM-7AM, please contact night-coverage

## 2023-08-06 NOTE — Evaluation (Signed)
 Occupational Therapy Evaluation Patient Details Name: Joy Tapia MRN: [id number] DOB: 18-May-1941 Today's Date: 08/06/2023   History of Present Illness   83 yo female admitted with hyperglycemic state, LE cellulitis. Hx of R TMA 05/2023, DM, CKD, mild cognitive impairment, gout, DVT, medical noncompliance, wound infection     Clinical Impressions PTA patient resides in ALF and as modified independent with BADL's and mobility with RW PRN, assistance for meds and meals and baseline cognitive deficits as per patient report and chart review. Patient now presents with decreased balance, activity tolerance and higher level functional reach deficits for BADL's. Patient would benefit from continued Acute OT services to maximize safety and HHOT upon d/c back to ALF with facility support.    If plan is discharge home, recommend the following:   A little help with walking and/or transfers;A little help with bathing/dressing/bathroom;Assistance with cooking/housework;Direct supervision/assist for medications management;Direct supervision/assist for financial management;Assist for transportation;Help with stairs or ramp for entrance;Supervision due to cognitive status     Functional Status Assessment   Patient has had a recent decline in their functional status and demonstrates the ability to make significant improvements in function in a reasonable and predictable amount of time.     Equipment Recommendations   None recommended by OT      Precautions/Restrictions   Precautions Precautions: Fall Required Braces or Orthoses:  (post op shoes) Restrictions Weight Bearing Restrictions Per Provider Order: No     Mobility Bed Mobility      Transfers Overall transfer level: Needs assistance Equipment used: Rolling walker (2 wheels) Transfers: Sit to/from Stand Sit to Stand: Min assist, Supervision           General transfer comment: post op shoes with slick bottoms       Balance Overall balance assessment: Needs assistance         Standing balance support: During functional activity Standing balance-Leahy Scale: Fair                             ADL either performed or assessed with clinical judgement   ADL Overall ADL's : Needs assistance/impaired Eating/Feeding: Modified independent   Grooming: Wash/dry hands;Wash/dry face;Sitting;Standing;Supervision/safety   Upper Body Bathing: Supervision/ safety   Lower Body Bathing: Contact guard assist;Sitting/lateral leans;Sit to/from stand   Upper Body Dressing : Supervision/safety   Lower Body Dressing: Contact guard assist;Sit to/from stand;Sitting/lateral leans   Toilet Transfer: Minimal assistance   Toileting- Clothing Manipulation and Hygiene: Supervision/safety;Cueing for sequencing;Sitting/lateral lean;Sit to/from stand       Functional mobility during ADLs: Contact guard assist;Rolling walker (2 wheels) General ADL Comments: min cues for multi step sequencing     Vision Baseline Vision/History: 1 Wears glasses Ability to See in Adequate Light: 0 Adequate Patient Visual Report: No change from baseline Vision Assessment?: Yes     Perception Perception: Within Functional Limits       Praxis Praxis: WFL       Pertinent Vitals/Pain Pain Assessment Pain Assessment: No/denies pain Faces Pain Scale: No hurt     Extremity/Trunk Assessment Upper Extremity Assessment Upper Extremity Assessment: Right hand dominant   Lower Extremity Assessment Lower Extremity Assessment: Generalized weakness   Cervical / Trunk Assessment Cervical / Trunk Assessment: Normal   Communication Communication Communication: No apparent difficulties   Cognition Arousal: Alert Behavior During Therapy: WFL for tasks assessed/performed Cognition: History of cognitive impairments, Cognition impaired   Orientation impairments: Person, Place, Situation Awareness: Online awareness  impaired,  Intellectual awareness impaired Memory impairment (select all impairments): Short-term memory, Working memory Attention impairment (select first level of impairment): Selective attention Executive functioning impairment (select all impairments): Organization, Sequencing, Reasoning, Problem solving OT - Cognition Comments: relatively intact for BADL sequencing and safety, needs cues for more complex multi task activity ie placing pad in panties with barrier cream 1st                 Following commands: Impaired Following commands impaired: Follows multi-step commands inconsistently     Cueing  General Comments   Cueing Techniques: Verbal cues  no skin issues noted   Exercises     Shoulder Instructions      Home Living Family/patient expects to be discharged to:: Assisted living                             Home Equipment: Rolling Walker (2 wheels);Cane - single point;Crutches;Shower seat;Other (comment);Hand held shower head   Additional Comments: reports taking care of her own BADL's      Prior Functioning/Environment Prior Level of Function : Needs assist  Cognitive Assist : ADLs (cognitive);Mobility (cognitive) Mobility (Cognitive): Set up cues ADLs (Cognitive): Set up cues       Mobility Comments: uses RW PRN ADLs Comments: mod ind with ADLs    OT Problem List: Decreased strength;Decreased activity tolerance;Impaired balance (sitting and/or standing);Decreased cognition   OT Treatment/Interventions: Self-care/ADL training;Therapeutic exercise;Energy conservation;DME and/or AE instruction;Therapeutic activities;Cognitive remediation/compensation;Balance training;Patient/family education      OT Goals(Current goals can be found in the care plan section)   Acute Rehab OT Goals Patient Stated Goal: to go home OT Goal Formulation: With patient Time For Goal Achievement: 08/13/23 Potential to Achieve Goals: Good ADL Goals Pt Will Perform Lower  Body Bathing: with modified independence;sitting/lateral leans;sit to/from stand Pt Will Perform Lower Body Dressing: with modified independence;sit to/from stand;sitting/lateral leans Pt Will Transfer to Toilet: with modified independence;ambulating   OT Frequency:  Min 2X/week       AM-PAC OT "6 Clicks" Daily Activity     Outcome Measure Help from another person eating meals?: None Help from another person taking care of personal grooming?: A Little Help from another person toileting, which includes using toliet, bedpan, or urinal?: A Little Help from another person bathing (including washing, rinsing, drying)?: A Little Help from another person to put on and taking off regular upper body clothing?: A Little Help from another person to put on and taking off regular lower body clothing?: A Little 6 Click Score: 19   End of Session Equipment Utilized During Treatment: Gait belt;Rolling walker (2 wheels) Nurse Communication: Mobility status  Activity Tolerance: Patient tolerated treatment well Patient left: in chair;with call bell/phone within reach;with chair alarm set  OT Visit Diagnosis: Muscle weakness (generalized) (M62.81);Unsteadiness on feet (R26.81);Cognitive communication deficit (R41.841) Symptoms and signs involving cognitive functions: Other cerebrovascular disease                Time: 1125-1145 OT Time Calculation (min): 20 min Charges:  OT General Charges $OT Visit: 1 Visit OT Evaluation $OT Eval Low Complexity: 1 Low  Wesam Gearhart OT/L Acute Rehabilitation Department  [phone]  08/06/2023, 4:30 PM

## 2023-08-06 NOTE — Plan of Care (Signed)

## 2023-08-07 ENCOUNTER — Other Ambulatory Visit (HOSPITAL_COMMUNITY): Payer: Self-pay

## 2023-08-07 ENCOUNTER — Encounter: Payer: Medicare Other | Admitting: Podiatry

## 2023-08-07 DIAGNOSIS — E1100 Type 2 diabetes mellitus with hyperosmolarity without nonketotic hyperglycemic-hyperosmolar coma (NKHHC): Secondary | ICD-10-CM | POA: Diagnosis not present

## 2023-08-07 LAB — MAGNESIUM: Magnesium: 1.6 mg/dL — ABNORMAL LOW (ref 1.7–2.4)

## 2023-08-07 LAB — CBC
HCT: 35.6 % — ABNORMAL LOW (ref 36.0–46.0)
Hemoglobin: 11.3 g/dL — ABNORMAL LOW (ref 12.0–15.0)
MCH: 27.8 pg (ref 26.0–34.0)
MCHC: 31.7 g/dL (ref 30.0–36.0)
MCV: 87.5 fL (ref 80.0–100.0)
Platelets: 202 10*3/uL (ref 150–400)
RBC: 4.07 MIL/uL (ref 3.87–5.11)
RDW: 15.5 % (ref 11.5–15.5)
WBC: 7 10*3/uL (ref 4.0–10.5)
nRBC: 0 % (ref 0.0–0.2)

## 2023-08-07 LAB — BASIC METABOLIC PANEL
Anion gap: 11 (ref 5–15)
BUN: 27 mg/dL — ABNORMAL HIGH (ref 8–23)
CO2: 18 mmol/L — ABNORMAL LOW (ref 22–32)
Calcium: 8.5 mg/dL — ABNORMAL LOW (ref 8.9–10.3)
Chloride: 104 mmol/L (ref 98–111)
Creatinine, Ser: 1.54 mg/dL — ABNORMAL HIGH (ref 0.44–1.00)
GFR, Estimated: 34 mL/min — ABNORMAL LOW (ref >60)
Glucose, Bld: 273 mg/dL — ABNORMAL HIGH (ref 70–99)
Potassium: 3.6 mmol/L (ref 3.5–5.1)
Sodium: 133 mmol/L — ABNORMAL LOW (ref 135–145)

## 2023-08-07 LAB — PHOSPHORUS: Phosphorus: 3 mg/dL (ref 2.5–4.6)

## 2023-08-07 LAB — GLUCOSE, CAPILLARY
Glucose-Capillary: 248 mg/dL — ABNORMAL HIGH (ref 70–99)
Glucose-Capillary: 268 mg/dL — ABNORMAL HIGH (ref 70–99)

## 2023-08-07 MED ORDER — ZINC OXIDE 40 % EX OINT
TOPICAL_OINTMENT | Freq: Four times a day (QID) | CUTANEOUS | Status: DC | PRN
  Filled 2023-08-07: qty 57

## 2023-08-07 MED ORDER — MUPIROCIN 2 % EX OINT
TOPICAL_OINTMENT | CUTANEOUS | 0 refills | Status: DC
Start: 2023-08-07 — End: 2023-08-07
  Filled 2023-08-07: qty 22, 14d supply, fill #0

## 2023-08-07 MED ORDER — DOXYCYCLINE HYCLATE 100 MG PO TABS
100.0000 mg | ORAL_TABLET | Freq: Two times a day (BID) | ORAL | 0 refills | Status: DC
  Filled 2023-08-07: qty 14, 7d supply, fill #0

## 2023-08-07 MED ORDER — DOXYCYCLINE HYCLATE 100 MG PO TABS: 100.0000 mg | ORAL_TABLET | Freq: Two times a day (BID) | ORAL | 0 refills | Status: AC

## 2023-08-07 MED ORDER — ZINC OXIDE 40 % EX OINT: TOPICAL_OINTMENT | Freq: Four times a day (QID) | CUTANEOUS | 0 refills | Status: AC | PRN

## 2023-08-07 MED ORDER — ZINC OXIDE 40 % EX OINT
TOPICAL_OINTMENT | Freq: Four times a day (QID) | CUTANEOUS | 0 refills | Status: DC | PRN
  Filled 2023-08-07: qty 56.7, fill #0

## 2023-08-07 MED ORDER — MAGNESIUM SULFATE 2 GM/50ML IV SOLN
2.0000 g | Freq: Once | INTRAVENOUS | Status: AC
  Administered 2023-08-07: 2 g via INTRAVENOUS
  Filled 2023-08-07: qty 50

## 2023-08-07 MED ORDER — MUPIROCIN 2 % EX OINT: TOPICAL_OINTMENT | CUTANEOUS | 0 refills | Status: DC

## 2023-08-07 NOTE — Discharge Instructions (Signed)
 Advised to follow-up with primary care physician in 1 week. Advised to take doxycycline 100 mg twice daily for 7 days. Advised local wound care. Advised to follow-up with wound care in 1 week. Advised to follow-up with podiatry in 2 weeks.

## 2023-08-07 NOTE — Discharge Summary (Signed)
 Physician Discharge Summary  Joy HUEBERT WUJ:[id number] DOB: [birth year] DOA: 08/04/2023  [facility]P: [facility]  Admit date: 08/04/2023  Discharge date: 08/07/2023  Admitted From: Home.  Disposition:  Home Health Services.  Recommendations for Outpatient Follow-up:  Follow up with PCP in 1-2 weeks. Please obtain BMP/CBC in one week. Advised to take doxycycline 100 mg twice daily for 7 days. Advised local wound care. Advised to follow-up with wound care in 1 week. Advised to follow-up with podiatry in 2 weeks.  Home Health: Home PT/OT Equipment/Devices: None  Discharge Condition: Stable CODE STATUS:Full code Diet recommendation: Heart Healthy   Brief [facility] Course: This 83 yrs old Female with MCI, lives in LTC, DM, hx. DVT, CKD IV with baseline 1.4-1.9, and recent right TMA for osteomyelitis who presented with hyperglycemia, generalized malaise. She is found at facility to have glucose 586 mg/dL, sent to the ER. In the ER, anion gap normal, lactate elevated, WBC normal.  Right foot x-rays showed no evidence of osteomyelitis. Given fluids,  antibiotics and insulin and admitted for HHS. DPM consulted. No plan for any surgical intervention. Follow up Outpatient and oral antibiotics for 7 days.  Patient needs local wound care.  And wound care follow-up.  Patient felt much better and wants to be discharged.  Patient being discharged back to long-term care.  Advised to take doxycycline 100 mg twice daily for 7 days, continue local wound care and follow-up wound care outpatient.  Discharge Diagnoses:  Principal Problem:   Hyperosmolar hyperglycemic state (HHS) (HCC) Active Problems:   Diabetic foot infection (HCC)   Hyperlipidemia associated with type 2 diabetes mellitus (HCC)   Mild cognitive impairment   CKD (chronic kidney disease) stage 4, GFR 15-29 ml/min (HCC)   HTN (hypertension)   Gout   Cellulitis of both lower extremities   Malnutrition of moderate  degree  HHS Type 2 diabetes with hyperglycemia: Blood glucose still hyperglycemic but better. Continue SS correction, increase dose Continue Lantus 6 units daily Continue Aspirin   Diabetic foot infection Recent right TMA: The feet does not appear to have significant infection today. Podiatry consulted, no plans for any surgical intervention. Recommended outpatient follow-up in 2 weeks. Can be discharged on oral doxycycline. Continue linezolid, Unasyn Continue nutrition supplements   Abdominal pain, vomiting CT abdomen shows possible enteritis,  no bowel obstruction. C. difficile antigen positive, toxin negative. Continue supportive care   Gout: Hold febuxostat.   Hypertension BP elevated Continue metoprolol. PRN hdyralazine Hold furosemide   Chronic diastolic CHF: Appears dehydrated. Hold furosemide   Hyperlipidemia: Resume pravastatin   Chronic kidney disease stage IV Cr stable relative to baseline 1.4-1.9   Dementia vs MCI Delirium precautions    Discharge Instructions  Discharge Instructions     Call MD for:  difficulty breathing, headache or visual disturbances   Complete by: As directed    Call MD for:  persistant dizziness or light-headedness   Complete by: As directed    Call MD for:  persistant nausea and vomiting   Complete by: As directed    Diet - low sodium heart healthy   Complete by: As directed    Diet Carb Modified   Complete by: As directed    Discharge instructions   Complete by: As directed    Advised to follow-up with primary care physician in 1 week. Advised to take doxycycline 100 mg twice daily for 7 days. Advised local wound care. Advised to follow-up with wound care in 1 week. Advised to  follow-up with podiatry in 2 weeks.   Discharge wound care:   Complete by: As directed    Advised to follow-up with wound care in 1 week.   Increase activity slowly   Complete by: As directed       Allergies as of 08/07/2023        Reactions   Atorvastatin Diarrhea   Allergy not listed on MAR    Azithromycin Other (See Comments)   Pt states burns her insides. Allergy not listed on MAR    Erythromycin Other (See Comments)   Unknown. Allergy not listed on MAR    Other Other (See Comments)   Adhesive bandage, Anesthesia Extension set  -  unknown. Allergy not listed on MAR    Propoxyphene Other (See Comments)   Allergy not listed on MAR         Medication List     TAKE these medications    acetaminophen 325 MG tablet Commonly known as: TYLENOL Take 650 mg by mouth every 8 (eight) hours as needed for mild pain (pain score 1-3) or moderate pain (pain score 4-6).   albuterol 108 (90 Base) MCG/ACT inhaler Commonly known as: VENTOLIN HFA Inhale 2 puffs into the lungs every 4 (four) hours as needed for wheezing or shortness of breath.   aspirin EC 81 MG tablet Take 81 mg by mouth daily.   cetirizine 10 MG tablet Commonly known as: ZYRTEC Take 10 mg by mouth daily.   doxycycline 100 MG tablet Commonly known as: VIBRA-TABS Take 1 tablet (100 mg total) by mouth 2 (two) times daily for 7 days.   Febuxostat 80 MG Tabs Take 1 tablet by mouth daily.   fluticasone 50 MCG/ACT nasal spray Commonly known as: FLONASE Place 1 spray into both nostrils daily as needed for allergies or rhinitis.   furosemide 40 MG tablet Commonly known as: LASIX Take 1 tablet (40 mg total) by mouth daily.   insulin detemir 100 UNIT/ML injection Commonly known as: LEVEMIR Inject 0.06 mLs (6 Units total) into the skin daily.   INSULIN SYRINGE .5CC/28G 28G X 1/2" 0.5 ML Misc 1 Application by Does not apply route daily.   liver oil-zinc oxide 40 % ointment Commonly known as: DESITIN Apply topically every 6 (six) hours as needed for irritation.   metoprolol succinate 50 MG 24 hr tablet Commonly known as: TOPROL-XL Take 50 mg by mouth daily.   multivitamin capsule Take 1 capsule by mouth daily.   mupirocin ointment 2  % Commonly known as: BACTROBAN Apply topically every other day.   omeprazole 20 MG capsule Commonly known as: PRILOSEC Take 20 mg by mouth daily.   oxyCODONE 5 MG immediate release tablet Commonly known as: Oxy IR/ROXICODONE Take 1 tablet (5 mg total) by mouth every 6 (six) hours as needed for moderate pain (pain score 4-6).   oxymetazoline 0.05 % nasal spray Commonly known as: AFRIN Place 1 spray into both nostrils 2 (two) times daily as needed for congestion.   pravastatin 20 MG tablet Commonly known as: PRAVACHOL Take 20 mg by mouth at bedtime.               Discharge Care Instructions  (From admission, onward)           Start     Ordered   08/07/23 0000  Discharge wound care:       Comments: Advised to follow-up with wound care in 1 week.   08/07/23 6962  Follow-up Information     [facility] Follow up in 1 week(s).   Contact information: [address] [place] [address] [phone]         Pilar Plate, DPM Follow up in 2 week(s).   Specialty: Podiatry Contact information: [address] [place] [address] [phone]                Allergies  Allergen Reactions   Atorvastatin Diarrhea    Allergy not listed on MAR    Azithromycin Other (See Comments)    Pt states burns her insides. Allergy not listed on MAR     Erythromycin Other (See Comments)    Unknown. Allergy not listed on MAR    Other Other (See Comments)    Adhesive bandage, Anesthesia Extension set  -  unknown. Allergy not listed on MAR      Propoxyphene Other (See Comments)    Allergy not listed on MAR      Consultations: Podiatry   Procedures/Studies: CT ABDOMEN PELVIS W CONTRAST Result Date: 08/06/2023 CLINICAL DATA:  Bowel obstruction suspected. EXAM: CT ABDOMEN AND PELVIS WITH CONTRAST TECHNIQUE: Multidetector CT imaging of the abdomen and pelvis was performed using the standard  protocol following bolus administration of intravenous contrast. RADIATION DOSE REDUCTION: This exam was performed according to the departmental dose-optimization program which includes automated exposure control, adjustment of the mA and/or kV according to patient size and/or use of iterative reconstruction technique. CONTRAST:  75mL OMNIPAQUE IOHEXOL 300 MG/ML  SOLN COMPARISON:  None Available. FINDINGS: Lower chest: There is mild eventration of the right hemidiaphragm with minimal right lung base linear atelectasis. The visualized left lung base is clear. There is coronary vascular calcification. No intra-abdominal free air.  Small free fluid in the pelvis. Hepatobiliary: Fatty liver. No biliary dilatation. Cholecystectomy. No retained calcified stone noted in the central CBD. Pancreas: Unremarkable. No pancreatic ductal dilatation or surrounding inflammatory changes. Spleen: Normal in size without focal abnormality. Adrenals/Urinary Tract: The adrenal glands are unremarkable. Mild bilateral renal parenchyma atrophy and cortical scarring. There is no hydronephrosis on either side. Subcentimeter bilateral renal hypodense lesions are too small to characterize. The visualized ureters are unremarkable. The urinary bladder is collapsed. Stomach/Bowel: There is mild thickened appearance of a segmental proximal jejunum in the upper abdomen with mild haziness of the associated mesentery which may represent enteritis. Clinical correlation is recommended. There is loose stool throughout the colon consistent with diarrheal state. Correlation with stool cultures recommended. There is no bowel obstruction. The appendix is normal. Vascular/Lymphatic: Mild aortoiliac atherosclerotic disease. The IVC is unremarkable. No portal venous gas. There is no adenopathy. Reproductive: Small calcified uterine fibroid. No suspicious adnexal masses. Other: None Musculoskeletal: Osteopenia with degenerative changes and scoliosis. No acute  osseous pathology. IMPRESSION: 1. Diarrheal state with possible enteritis. Clinical correlation is recommended. No bowel obstruction. Normal appendix. 2. Fatty liver. Electronically Signed   By: Elgie Collard M.D.   On: 08/06/2023 11:09   DG Foot Complete Left Result Date: 08/04/2023 CLINICAL DATA:  Ulcer on left foot. EXAM: LEFT FOOT - COMPLETE 3+ VIEW COMPARISON:  Left foot radiographs 06/05/2023 FINDINGS: There is diffuse decreased bone mineralization. There is osseous fusion of the great toe interphalangeal joint and the fifth DIP joint. Old healed fracture of the distal shaft of the fifth metatarsal with mild lateral apex angulation, unchanged from prior. Old healed fracture of the distal fourth metatarsal shaft with minimal medial displacement of the distal fracture component,  unchanged. Mild-to-moderate posterior calcaneal heel spur at the Achilles tendon insertion. Moderate interphalangeal joint space narrowing diffusely. Mild talonavicular navicular-cuneiform joint space narrowing. Irregularity and possible ulceration of the plantar foot at the level of the proximal shafts of the metatarsals on lateral view. No definite cortical erosion is seen. No acute fracture or dislocation. IMPRESSION: 1. Irregularity and possible soft tissue ulceration of the plantar foot at the level of the proximal shafts of the metatarsals on lateral view. No definite cortical erosion is seen. 2. Old healed fractures of the distal fourth and fifth metatarsal shafts. 3. Mild-to-moderate posterior calcaneal heel spur at the Achilles tendon insertion. Electronically Signed   By: Neita Garnet M.D.   On: 08/04/2023 16:09   DG Chest Port 1 View Result Date: 08/04/2023 CLINICAL DATA:  Weakness and hyperglycemia. EXAM: PORTABLE CHEST 1 VIEW COMPARISON:  Chest two views 04/26/2023 FINDINGS: Cardiac silhouette and mediastinal contours are atheromatous. Moderate elevation of the right hemidiaphragm is unchanged. The lungs are clear.  No pleural effusion or pneumothorax. Mild dextrocurvature of the midthoracic spine. Mild-to-moderate multilevel degenerative disc changes. Severe right-greater-than-left glenohumeral osteoarthritis. Left greater than right high-riding humeral heads suspicious for superior full-thickness rotator cuff tears. IMPRESSION: 1.  No acute cardiopulmonary disease process. 2. Moderate elevation of the right hemidiaphragm is unchanged. Electronically Signed   By: Neita Garnet M.D.   On: 08/04/2023 12:13   DG Foot Complete Right Result Date: 08/04/2023 CLINICAL DATA:  Cellulitis. EXAM: RIGHT FOOT COMPLETE - 3+ VIEW COMPARISON:  Right foot radiographs 06/12/2023 and 06/05/2023; MRI right forefoot 06/12/2023 FINDINGS: Unfortunately, the frontal view is limited by patient motion artifact which decreases evaluation of fine bony detail. On the oblique and lateral view, the first through fifth proximal metatarsal shaft amputation stumps appear sharp. No definitive erosion is seen. There is some new healing sclerosis at the distal aspect of the each amputation site compared to the most recent 06/16/2023 radiographs at the time of this amputation. There is apparent soft tissue swelling at the distal aspect of the soft tissue amputation site, with some areas of soft tissue lucency that may represent air and soft tissue infection. Moderate second through fifth tarsometatarsal joint space narrowing. Small plantar and posterior calcaneal heel spurs. IMPRESSION: 1. Interval first through fifth metatarsal amputation to the proximal shafts. 2. The first through fifth proximal metatarsal shaft amputation stumps appear sharp. No definitive erosion is seen. 3. There is apparent soft tissue swelling at the distal aspect of the soft tissue amputation site, with some areas of soft tissue lucency that may represent air and soft tissue infection. Electronically Signed   By: Neita Garnet M.D.   On: 08/04/2023 12:12    Subjective: Patient was  seen and examined at bedside.  Overnight events noted.   Patient reports doing much better and wants to be discharged.  Blood sugar has been controlled.  Discharge Exam: Vitals:   08/06/23 2048 08/07/23 0514  BP: (!) 157/54 (!) 156/72  Pulse: 64 69  Resp: 17 18  Temp: (!) 97.4 F (36.3 C) 98.4 F (36.9 C)  SpO2: 99% 99%   Vitals:   08/05/23 2259 08/06/23 1356 08/06/23 2048 08/07/23 0514  BP: (!) 161/66 (!) 156/64 (!) 157/54 (!) 156/72  Pulse: (!) 103 70 64 69  Resp: 16 19 17 18   Temp: 100.2 F (37.9 C) 98.2 F (36.8 C) (!) 97.4 F (36.3 C) 98.4 F (36.9 C)  TempSrc: Oral Oral Oral Oral  SpO2: 100% 100% 99% 99%  Weight:  Height:        General: Pt is alert, awake, not in acute distress Cardiovascular: RRR, S1/S2 +, no rubs, no gallops Respiratory: CTA bilaterally, no wheezing, no rhonchi Abdominal: Soft, NT, ND, bowel sounds + Extremities: no edema, no cyanosis    The results of significant diagnostics from this hospitalization (including imaging, microbiology, ancillary and laboratory) are listed below for reference.     Microbiology: Recent Results (from the past 240 hours)  Resp panel by RT-PCR (RSV, Flu A&B, Covid) Anterior Nasal Swab     Status: None   Collection Time: 08/04/23 10:32 AM   Specimen: Anterior Nasal Swab  Result Value Ref Range Status   SARS Coronavirus 2 by RT PCR NEGATIVE NEGATIVE Final    Comment: (NOTE) SARS-CoV-2 target nucleic acids are NOT DETECTED.  The SARS-CoV-2 RNA is generally detectable in upper respiratory specimens during the acute phase of infection. The lowest concentration of SARS-CoV-2 viral copies this assay can detect is 138 copies/mL. A negative result does not preclude SARS-Cov-2 infection and should not be used as the sole basis for treatment or other patient management decisions. A negative result may occur with  improper specimen collection/handling, submission of specimen other than nasopharyngeal swab,  presence of viral mutation(s) within the areas targeted by this assay, and inadequate number of viral copies(<138 copies/mL). A negative result must be combined with clinical observations, patient history, and epidemiological information. The expected result is Negative.  Fact Sheet for Patients:  BloggerCourse.com  Fact Sheet for Healthcare Providers:  SeriousBroker.it  This test is no t yet approved or cleared by the [place] FDA and  has been authorized for detection and/or diagnosis of SARS-CoV-2 by FDA under an Emergency Use Authorization (EUA). This EUA will remain  in effect (meaning this test can be used) for the duration of the COVID-19 declaration under Section 564(b)(1) of the Act, 21 U.S.C.section 360bbb-3(b)(1), unless the authorization is terminated  or revoked sooner.       Influenza A by PCR NEGATIVE NEGATIVE Final   Influenza B by PCR NEGATIVE NEGATIVE Final    Comment: (NOTE) The Xpert Xpress SARS-CoV-2/FLU/RSV plus assay is intended as an aid in the diagnosis of influenza from Nasopharyngeal swab specimens and should not be used as a sole basis for treatment. Nasal washings and aspirates are unacceptable for Xpert Xpress SARS-CoV-2/FLU/RSV testing.  Fact Sheet for Patients: BloggerCourse.com  Fact Sheet for Healthcare Providers: SeriousBroker.it  This test is not yet approved or cleared by the [place] FDA and has been authorized for detection and/or diagnosis of SARS-CoV-2 by FDA under an Emergency Use Authorization (EUA). This EUA will remain in effect (meaning this test can be used) for the duration of the COVID-19 declaration under Section 564(b)(1) of the Act, 21 U.S.C. section 360bbb-3(b)(1), unless the authorization is terminated or revoked.     Resp Syncytial Virus by PCR NEGATIVE NEGATIVE Final    Comment: (NOTE) Fact Sheet for  Patients: BloggerCourse.com  Fact Sheet for Healthcare Providers: SeriousBroker.it  This test is not yet approved or cleared by the [place] FDA and has been authorized for detection and/or diagnosis of SARS-CoV-2 by FDA under an Emergency Use Authorization (EUA). This EUA will remain in effect (meaning this test can be used) for the duration of the COVID-19 declaration under Section 564(b)(1) of the Act, 21 U.S.C. section 360bbb-3(b)(1), unless the authorization is terminated or revoked.  Performed at [facility], [address] [place], [place] [address]   Culture,  blood (routine x 2)     Status: None (Preliminary result)   Collection Time: 08/04/23 11:28 AM   Specimen: BLOOD RIGHT ARM  Result Value Ref Range Status   Specimen Description   Final    BLOOD RIGHT ARM Performed at [facility], [address], [place], [place] [address]    Special Requests   Final    BOTTLES DRAWN AEROBIC AND ANAEROBIC Blood Culture adequate volume Performed at [facility], [address], [place], [place] [address]    Culture   Final    NO GROWTH 3 DAYS Performed at [facility], [address], [place], [place] [address]    Report Status PENDING  Incomplete  Culture, blood (routine x 2)     Status: None (Preliminary result)   Collection Time: 08/04/23 11:36 AM   Specimen: BLOOD LEFT HAND  Result Value Ref Range Status   Specimen Description   Final    BLOOD LEFT HAND Performed at [facility], [address] [place], [place] [address]    Special Requests   Final    AEROBIC BOTTLE ONLY Blood Culture adequate volume Performed at [facility] [address], [place], [place] [address]    Culture   Final    NO GROWTH 3 DAYS Performed at [facility], [address], [place], [place] [address]    Report Status PENDING  Incomplete  C Difficile Quick  Screen w PCR reflex     Status: Abnormal   Collection Time: 08/05/23  6:04 PM   Specimen: STOOL  Result Value Ref Range Status   C Diff antigen POSITIVE (A) NEGATIVE Final   C Diff toxin NEGATIVE NEGATIVE Final   C Diff interpretation Results are indeterminate. See PCR results.  Final    Comment: Performed at [facility], [address] [place], [place] [address]  C. Diff by PCR, Reflexed     Status: None   Collection Time: 08/05/23  6:04 PM  Result Value Ref Range Status   Toxigenic C. Difficile by PCR NEGATIVE NEGATIVE Final    Comment: Patient is colonized with non toxigenic C. difficile. May not need treatment unless significant symptoms are present. Performed at [facility], [address], [place], [place] [address]      Labs: BNP (last 3 results) No results for input(s): "BNP" in the last 8760 hours. Basic Metabolic Panel: Recent Labs  Lab 08/04/23 0952 08/04/23 0958 08/04/23 1438 08/05/23 0418 08/06/23 0423 08/07/23 0421  NA 131* 130* 136 136 131* 133*  K 4.4 4.8 4.0 3.5 3.8 3.6  CL 93* 93* 99 101 99 104  CO2 24  --  27 23 21* 18*  GLUCOSE 531* 542* 306* 220* 351* 273*  BUN 22 27* 20 17 18  27*  CREATININE 1.72* 1.80* 1.53* 1.44* 1.37* 1.54*  CALCIUM 9.2  --  9.0 8.6* 8.1* 8.5*  MG  --   --   --   --   --  1.6*  PHOS  --   --   --   --   --  3.0   Liver Function Tests: Recent Labs  Lab 08/04/23 0952 08/04/23 1438 08/05/23 0418 08/06/23 0423  AST 56* 53* 46* 75*  ALT 30 31 25 30   ALKPHOS 110 102 89 84  BILITOT 0.6 0.6 0.6 0.5  PROT 6.8 6.3* 5.6* 5.6*  ALBUMIN 3.4* 3.0* 2.8* 2.8*   No results  for input(s): "LIPASE", "AMYLASE" in the last 168 hours. No results for input(s): "AMMONIA" in the last 168 hours. CBC: Recent Labs  Lab 08/04/23 0952 08/04/23 0958 08/05/23 0418 08/07/23 0421  WBC 10.5  --  9.6 7.0  NEUTROABS  --   --  6.8  --   HGB 11.9* 11.6* 10.7* 11.3*  HCT 35.5* 34.0* 33.1* 35.6*  MCV 84.9  --  88.7 87.5   PLT 266  --  226 202   Cardiac Enzymes: No results for input(s): "CKTOTAL", "CKMB", "CKMBINDEX", "TROPONINI" in the last 168 hours. BNP: Invalid input(s): "POCBNP" CBG: Recent Labs  Lab 08/06/23 0754 08/06/23 1202 08/06/23 1602 08/06/23 2132 08/07/23 0741  GLUCAP 274* 186* 231* 184* 248*   D-Dimer No results for input(s): "DDIMER" in the last 72 hours. Hgb A1c No results for input(s): "HGBA1C" in the last 72 hours. Lipid Profile No results for input(s): "CHOL", "HDL", "LDLCALC", "TRIG", "CHOLHDL", "LDLDIRECT" in the last 72 hours. Thyroid function studies No results for input(s): "TSH", "T4TOTAL", "T3FREE", "THYROIDAB" in the last 72 hours.  Invalid input(s): "FREET3" Anemia work up No results for input(s): "VITAMINB12", "FOLATE", "FERRITIN", "TIBC", "IRON", "RETICCTPCT" in the last 72 hours. Urinalysis    Component Value Date/Time   COLORURINE STRAW (A) 08/04/2023 0946   APPEARANCEUR CLEAR 08/04/2023 0946   LABSPEC 1.005 08/04/2023 0946   PHURINE 7.0 08/04/2023 0946   GLUCOSEU >=500 (A) 08/04/2023 0946   HGBUR NEGATIVE 08/04/2023 0946   BILIRUBINUR NEGATIVE 08/04/2023 0946   KETONESUR NEGATIVE 08/04/2023 0946   PROTEINUR 30 (A) 08/04/2023 0946   NITRITE NEGATIVE 08/04/2023 0946   LEUKOCYTESUR NEGATIVE 08/04/2023 0946   Sepsis Labs Recent Labs  Lab 08/04/23 0952 08/05/23 0418 08/07/23 0421  WBC 10.5 9.6 7.0   Microbiology Recent Results (from the past 240 hours)  Resp panel by RT-PCR (RSV, Flu A&B, Covid) Anterior Nasal Swab     Status: None   Collection Time: 08/04/23 10:32 AM   Specimen: Anterior Nasal Swab  Result Value Ref Range Status   SARS Coronavirus 2 by RT PCR NEGATIVE NEGATIVE Final    Comment: (NOTE) SARS-CoV-2 target nucleic acids are NOT DETECTED.  The SARS-CoV-2 RNA is generally detectable in upper respiratory specimens during the acute phase of infection. The lowest concentration of SARS-CoV-2 viral copies this assay can detect is 138  copies/mL. A negative result does not preclude SARS-Cov-2 infection and should not be used as the sole basis for treatment or other patient management decisions. A negative result may occur with  improper specimen collection/handling, submission of specimen other than nasopharyngeal swab, presence of viral mutation(s) within the areas targeted by this assay, and inadequate number of viral copies(<138 copies/mL). A negative result must be combined with clinical observations, patient history, and epidemiological information. The expected result is Negative.  Fact Sheet for Patients:  BloggerCourse.com  Fact Sheet for Healthcare Providers:  SeriousBroker.it  This test is no t yet approved or cleared by the [place] FDA and  has been authorized for detection and/or diagnosis of SARS-CoV-2 by FDA under an Emergency Use Authorization (EUA). This EUA will remain  in effect (meaning this test can be used) for the duration of the COVID-19 declaration under Section 564(b)(1) of the Act, 21 U.S.C.section 360bbb-3(b)(1), unless the authorization is terminated  or revoked sooner.       Influenza A by PCR NEGATIVE NEGATIVE Final   Influenza B by PCR NEGATIVE NEGATIVE Final    Comment: (NOTE) The Xpert Xpress SARS-CoV-2/FLU/RSV plus assay is  intended as an aid in the diagnosis of influenza from Nasopharyngeal swab specimens and should not be used as a sole basis for treatment. Nasal washings and aspirates are unacceptable for Xpert Xpress SARS-CoV-2/FLU/RSV testing.  Fact Sheet for Patients: BloggerCourse.com  Fact Sheet for Healthcare Providers: SeriousBroker.it  This test is not yet approved or cleared by the [place] FDA and has been authorized for detection and/or diagnosis of SARS-CoV-2 by FDA under an Emergency Use Authorization (EUA). This EUA will remain in effect (meaning  this test can be used) for the duration of the COVID-19 declaration under Section 564(b)(1) of the Act, 21 U.S.C. section 360bbb-3(b)(1), unless the authorization is terminated or revoked.     Resp Syncytial Virus by PCR NEGATIVE NEGATIVE Final    Comment: (NOTE) Fact Sheet for Patients: BloggerCourse.com  Fact Sheet for Healthcare Providers: SeriousBroker.it  This test is not yet approved or cleared by the [place] FDA and has been authorized for detection and/or diagnosis of SARS-CoV-2 by FDA under an Emergency Use Authorization (EUA). This EUA will remain in effect (meaning this test can be used) for the duration of the COVID-19 declaration under Section 564(b)(1) of the Act, 21 U.S.C. section 360bbb-3(b)(1), unless the authorization is terminated or revoked.  Performed at [facility], [address] [place], [place] [address]   Culture, blood (routine x 2)     Status: None (Preliminary result)   Collection Time: 08/04/23 11:28 AM   Specimen: BLOOD RIGHT ARM  Result Value Ref Range Status   Specimen Description   Final    BLOOD RIGHT ARM Performed at [facility], [address], [place], [place] [address]    Special Requests   Final    BOTTLES DRAWN AEROBIC AND ANAEROBIC Blood Culture adequate volume Performed at [facility], [address], [place], [place] [address]    Culture   Final    NO GROWTH 3 DAYS Performed at [facility], [address] [place], [place] [address]    Report Status PENDING  Incomplete  Culture, blood (routine x 2)     Status: None (Preliminary result)   Collection Time: 08/04/23 11:36 AM   Specimen: BLOOD LEFT HAND  Result Value Ref Range Status   Specimen Description   Final    BLOOD LEFT HAND Performed at [facility], [address], [place], [place] [address]    Special Requests   Final    AEROBIC BOTTLE ONLY Blood  Culture adequate volume Performed at [facility], [address] [place], [place] [address]    Culture   Final    NO GROWTH 3 DAYS Performed at [facility], [address], [place], [place] [address]    Report Status PENDING  Incomplete  C Difficile Quick Screen w PCR reflex     Status: Abnormal   Collection Time: 08/05/23  6:04 PM   Specimen: STOOL  Result Value Ref Range Status   C Diff antigen POSITIVE (A) NEGATIVE Final   C Diff toxin NEGATIVE NEGATIVE Final   C Diff interpretation Results are indeterminate. See PCR results.  Final    Comment: Performed at [facility], [address] [place], [place] [address]  C. Diff by PCR, Reflexed     Status: None   Collection Time: 08/05/23  6:04 PM  Result Value Ref Range Status   Toxigenic C. Difficile by PCR NEGATIVE NEGATIVE Final  Comment: Patient is colonized with non toxigenic C. difficile. May not need treatment unless significant symptoms are present. Performed at [facility], [address], [place], [place] [address]      Time coordinating discharge: Over 30 minutes  SIGNED:   Willeen Niece, MD  [facility] 08/07/2023, 12:34 PM Pager   If 7PM-7AM, please contact night-coverage

## 2023-08-07 NOTE — [place] FL2 (Signed)
 [place] MEDICAID FL2 LEVEL OF CARE FORM     IDENTIFICATION  Patient Name: Joy Tapia Birthdate: January 01, 1941 Sex: female Admission Date (Current Location): 08/04/2023  [facility] and [place] Number:  Producer, television/film/video and Address:  [facility],  [address] [place], [place] [address]      Provider Number: [id number]  Attending Physician Name and Address:  Willeen Niece, MD  Relative Name and Phone Number:  Schweitzer,(Medical POA)Cathy Clearence Cheek)  [phone] (Mobile)    Current Level of Care: Hospital Recommended Level of Care: Assisted Living Facility Prior Approval Number:    Date Approved/Denied:   PASRR Number:    Discharge Plan: Other (Comment) (ALF)    Current Diagnoses: Patient Active Problem List   Diagnosis Date Noted   Malnutrition of moderate degree 08/05/2023   Hyperosmolar hyperglycemic state (HHS) (HCC) 08/04/2023   Cellulitis of both lower extremities 08/04/2023   Equinus deformity of right foot 06/16/2023   Ulcer of left foot with fat layer exposed (HCC) 06/16/2023   Diabetic foot infection (HCC) 06/13/2023   Cellulitis 04/26/2023   Fracture of patella 03/05/2023   Hoarding disorder 07/02/2022   CKD (chronic kidney disease) stage 4, GFR 15-29 ml/min (HCC) 06/29/2022   HTN (hypertension) 06/29/2022   Osteomyelitis (HCC) 06/28/2022   Diabetes mellitus (HCC) 04/29/2022   Mild cognitive impairment 06/26/2021   Non-compliance 04/11/2019   Health care maintenance 09/15/2014   Gout 12/25/2013   Hyperlipidemia associated with type 2 diabetes mellitus (HCC) 12/25/2013   Hypertension in stage 4 chronic kidney disease due to type 2 diabetes mellitus (HCC) 12/25/2013   Osteoporosis 12/25/2013    Orientation RESPIRATION BLADDER Height & Weight     Self  Normal Continent Weight: 124 lb 9 oz (56.5 kg) Height:  5\' 2"  (157.5 cm)  BEHAVIORAL SYMPTOMS/MOOD NEUROLOGICAL BOWEL NUTRITION STATUS      Incontinent Diet (Heart healthy/ card mod)   AMBULATORY STATUS COMMUNICATION OF NEEDS Skin   Limited Assist Verbally Other (Comment) (Diabetic ulcer left  heel; right heel popped blister)                       Personal Care Assistance Level of Assistance  Bathing, Feeding, Dressing Bathing Assistance: Limited assistance Feeding assistance: Independent Dressing Assistance: Limited assistance     Functional Limitations Info  Sight, Hearing, Speech Sight Info: Adequate Hearing Info: Adequate Speech Info: Adequate    SPECIAL CARE FACTORS FREQUENCY                       Contractures Contractures Info: Not present    Additional Factors Info  Code Status, Allergies Code Status Info: Full Allergies Info: Atorvastatin Azithromycin Erythromycin Other Propoxyphene           Current Medications (08/07/2023):  This is the current hospital active medication list Current Facility-Administered Medications  Medication Dose Route Frequency Provider Last Rate Last Admin   acetaminophen (TYLENOL) tablet 650 mg  650 mg Oral Q6H PRN Bobette Mo, MD   650 mg at 08/06/23 1324   Or   acetaminophen (TYLENOL) suppository 650 mg  650 mg Rectal Q6H PRN Bobette Mo, MD       Ampicillin-Sulbactam (UNASYN) 3 g in sodium chloride 0.9 % 100 mL IVPB  3 g Intravenous Q12H Bobette Mo, MD 200 mL/hr at 08/07/23 0956 3 g at 08/07/23 0956   aspirin EC tablet 81 mg  81 mg Oral Daily Danford, Earl Lites, MD  81 mg at 08/07/23 0950   enoxaparin (LOVENOX) injection 30 mg  30 mg Subcutaneous QHS Bobette Mo, MD   30 mg at 08/06/23 2258   gentamicin ointment (GARAMYCIN) 0.1 %   Topical QODAY Bobette Mo, MD   Given at 08/05/23 1011   hydrALAZINE (APRESOLINE) injection 5 mg  5 mg Intravenous Q6H PRN Danford, Earl Lites, MD       insulin aspart (novoLOG) injection 0-15 Units  0-15 Units Subcutaneous TID WC Alberteen Sam, MD   5 Units at 08/07/23 0939   insulin aspart (novoLOG) injection 0-5 Units   0-5 Units Subcutaneous QHS Alberteen Sam, MD   2 Units at 08/05/23 2214   insulin glargine (LANTUS) injection 6 Units  6 Units Subcutaneous Daily Alberteen Sam, MD   6 Units at 08/07/23 0938   iohexol (OMNIPAQUE) 300 MG/ML solution 30 mL  30 mL Oral Once PRN Alberteen Sam, MD       linezolid (ZYVOX) tablet 600 mg  600 mg Oral Q12H Willeen Niece, MD   600 mg at 08/07/23 0944   liver oil-zinc oxide (DESITIN) 40 % ointment   Topical Q6H PRN Anthoney Harada, NP   Given at 08/07/23 0232   loratadine (CLARITIN) tablet 10 mg  10 mg Oral Daily PRN Alberteen Sam, MD   10 mg at 08/06/23 2258   magnesium sulfate IVPB 2 g 50 mL  2 g Intravenous Once Willeen Niece, MD       metoprolol succinate (TOPROL-XL) 24 hr tablet 50 mg  50 mg Oral Daily Alberteen Sam, MD   50 mg at 08/07/23 0944   multivitamin with minerals tablet 1 tablet  1 tablet Oral Daily Alberteen Sam, MD   1 tablet at 08/07/23 9528   nutrition supplement (JUVEN) (JUVEN) powder packet 1 packet  1 packet Oral BID BM Danford, Earl Lites, MD   1 packet at 08/07/23 0945   ondansetron (ZOFRAN) tablet 4 mg  4 mg Oral Q6H PRN Bobette Mo, MD       Or   ondansetron [facility]) injection 4 mg  4 mg Intravenous Q6H PRN Bobette Mo, MD   4 mg at 08/05/23 1356   oxyCODONE (Oxy IR/ROXICODONE) immediate release tablet 5 mg  5 mg Oral Q6H PRN Alberteen Sam, MD   5 mg at 08/06/23 2257   pravastatin (PRAVACHOL) tablet 20 mg  20 mg Oral QHS Alberteen Sam, MD   20 mg at 08/06/23 2259   protein supplement (ENSURE MAX) liquid  11 oz Oral BID Alberteen Sam, MD   11 oz at 08/06/23 2016   sodium chloride (OCEAN) 0.65 % nasal spray 1 spray  1 spray Each Nare PRN Luiz Iron, NP   1 spray at 08/04/23 2327     Discharge Medications: Please see discharge summary for a list of discharge medications.  Relevant Imaging Results:  Relevant Lab Results:   Additional  Information SSN:[id number]  Valentina Shaggy Deedra Pro, LCSW

## 2023-08-07 NOTE — TOC Transition Note (Signed)
 Transition of Care [facility]) - Discharge Note   Patient Details  Name: Joy Tapia MRN: [id number] Date of Birth: [birth year]  Transition of Care [facility]) CM/SW Contact:  Larrie Kass, LCSW Phone Number: 08/07/2023, 10:21 AM   Clinical Narrative:    CSW spoke with the pt's HCPOA, Lynden Ang, who confirmed that the pt is from [employer]. Lynden Ang stated that the plan is for the pt to return to the facility. Pt is active with [facility] RN, PT, and OT. Lynden Ang mentioned that the facility may provide transport for the pt. She stated that she is out of town and cannot pick her up; however, she mentioned the facility may be able to provide transportation.  CSW spoke with a receptionist at [facility], who stated that the RN who handles residents' returns is in a meeting. CSW left contact information and requested a return call . TOC to follow.   12:00pm  CSW spoke with [employer], who stated that the pt's RN is not there today and that CSW can fax the pt's discharge summary and FL2 to [phone]. She mentioned that the pt will need EMS transport to the facility. [facility] was called. TOC sign-off.  Final next level of care: Assisted Living Barriers to Discharge: Barriers Resolved   Patient Goals and CMS Choice            Discharge Placement                  Name of family member notified: Schweitzer,(Medical POA)Cathy (Friend)  [phone] (Mobile) Patient and family notified of of transfer: 08/07/23  Discharge Plan and Services Additional resources added to the After Visit Summary for                                       Social Drivers of Health (SDOH) Interventions SDOH Screenings   Food Insecurity: No Food Insecurity (08/04/2023)  Housing: Low Risk  (08/04/2023)  Transportation Needs: No Transportation Needs (08/04/2023)  Utilities: Not At Risk (08/04/2023)  Social Connections: Moderately Isolated  (08/04/2023)  Tobacco Use: Medium Risk (08/04/2023)     Readmission Risk Interventions    04/28/2023    3:08 PM  Readmission Risk Prevention Plan  Transportation Screening Complete  PCP or Specialist Appt within 5-7 Days Complete  Home Care Screening Complete  Medication Review (RN CM) Complete

## 2023-08-09 LAB — CULTURE, BLOOD (ROUTINE X 2)
Culture: NO GROWTH
Culture: NO GROWTH
Special Requests: ADEQUATE
Special Requests: ADEQUATE

## 2023-08-14 ENCOUNTER — Encounter: Payer: Self-pay | Admitting: Podiatry

## 2023-08-14 ENCOUNTER — Ambulatory Visit (INDEPENDENT_AMBULATORY_CARE_PROVIDER_SITE_OTHER): Payer: Medicare Other | Admitting: Podiatry

## 2023-08-14 VITALS — Ht 62.0 in | Wt 124.6 lb

## 2023-08-14 DIAGNOSIS — L97522 Non-pressure chronic ulcer of other part of left foot with fat layer exposed: Secondary | ICD-10-CM

## 2023-08-14 DIAGNOSIS — E11621 Type 2 diabetes mellitus with foot ulcer: Secondary | ICD-10-CM

## 2023-08-14 DIAGNOSIS — Z9889 Other specified postprocedural states: Secondary | ICD-10-CM

## 2023-08-14 DIAGNOSIS — Z89439 Acquired absence of unspecified foot: Secondary | ICD-10-CM

## 2023-08-14 NOTE — Progress Notes (Signed)
  Subjective:  Patient ID: Joy Tapia, female    DOB: 1940/11/13,  MRN: [id number]   DOS: 06/16/2023 Procedure: 1. Excisional debridement of ulceration to sub cutaneous fat level with prep for graft 1x1x0.2 cm, left 2. Application amniotic graft 1.5 x 1.5 x 0.3 cm, left   3. Transmetatarsal amputation of right foot 4. Tendo achilles lengthening of right foot  83 y.o. female seen for post op check.  Patient is approximately 2 mo status post above procedures.    Patient is doing well she was admitted recently at [facility] for hyperglycemia.  Wounds were evaluated the time and noted to be doing well with no evidence of infection.  She is with her daughter today.  Has been getting dressing change 3 times weekly at her nursing facility.  Wearing bilateral postop shoes as instructed  Review of Systems: Negative except as noted in the HPI. Denies N/V/F/Ch.   Objective:   There were no vitals filed for this visit.  Body mass index is 22.78 kg/m. Constitutional Well developed. Well nourished.  Vascular Foot warm and well perfused. Capillary refill normal to all digits.   No calf pain with palpation  Neurologic Normal speech. Oriented to person, place, and time. Epicritic sensation absent to right foot  Dermatologic TMA site well-healed with no dehiscence erythema drainage or concern for residual infection.  Right heel with very superficial wound limited to breakdown of skin that appears to be nearly fully healed with healthy tissue in the wound bed no evidence of infection no erythema or drainage.  Dry hyperkeratotic peeling skin around the wound site  Left foot small circular ulceration plantar forefoot which is circular and appears to be healing in no significant fibrotic tissues there is some hyperkeratotic tissue buildup around the wound margin.  Healthy wound base with granular tissue.   Orthopedic: Status post right foot TMA and tendo Achilles lengthening    Radiographs: Interval transmetatarsal amputation of the right foot  Pathology:  Skin with ulcer and necrotizing inflammation  Micro: C. Striatum  Assessment:   Osteomyelitis of right foot status post transmetatarsal amputation and tendo Achilles lengthening and ulceration to subcutaneous fat level status postdebridement on the left foot  Plan:  Patient was evaluated and treated and all questions answered.  2 mo s/p right foot transmetatarsal amputation and tendo Achilles lengthening and left foot ulcer debridement and graft application -Progressing as expected postoperatively with good healing of the right foot transmetatarsal amputation site as well as the left foot ulceration -Right heel ulceration very superficial likely from a ruptured serous blister.  Recommend wound care to include cleansing the area with saline and wound spray and applying Xeroform and covering with padded foam dressing -Left foot continue 3 times weekly dressing changes with antibiotic ointment/gentamicin and mupirocin and cover with adhesive bandage -WB Status: WBAT BLE in post op shoe -Medications/ABX: No antibiotics indicated -Dressings changed.  -Follow-up in 4 weeks for wound check on left foot        Corinna Gab, DPM [facility] / [facility]

## 2023-09-01 ENCOUNTER — Encounter: Payer: Self-pay | Admitting: Podiatry

## 2023-09-16 ENCOUNTER — Encounter: Admitting: Podiatry

## 2023-09-18 ENCOUNTER — Ambulatory Visit (INDEPENDENT_AMBULATORY_CARE_PROVIDER_SITE_OTHER): Admitting: Podiatry

## 2023-09-18 ENCOUNTER — Encounter: Payer: Self-pay | Admitting: Podiatry

## 2023-09-18 DIAGNOSIS — E11621 Type 2 diabetes mellitus with foot ulcer: Secondary | ICD-10-CM

## 2023-09-18 DIAGNOSIS — L97522 Non-pressure chronic ulcer of other part of left foot with fat layer exposed: Secondary | ICD-10-CM

## 2023-09-18 DIAGNOSIS — Z89439 Acquired absence of unspecified foot: Secondary | ICD-10-CM | POA: Diagnosis not present

## 2023-09-18 DIAGNOSIS — Z9889 Other specified postprocedural states: Secondary | ICD-10-CM

## 2023-09-18 MED ORDER — CADEXOMER IODINE 0.9 % EX GEL: 1.0000 | Freq: Every day | CUTANEOUS | 0 refills | Status: DC | PRN

## 2023-09-18 NOTE — Progress Notes (Signed)
  Subjective:  Patient ID: Joy Tapia, female    DOB: July 16, 1940,  MRN: [id number]   DOS: 06/16/2023 Procedure: 1. Excisional debridement of ulceration to sub cutaneous fat level with prep for graft 1x1x0.2 cm, left 2. Application amniotic graft 1.5 x 1.5 x 0.3 cm, left   3. Transmetatarsal amputation of right foot 4. Tendo achilles lengthening of right foot  83 y.o. female seen for post op check.  Patient is approximately 3 mo status post above procedures.    Review of Systems: Negative except as noted in the HPI. Denies N/V/F/Ch.   Objective:   There were no vitals filed for this visit.  There is no height or weight on file to calculate BMI. Constitutional Well developed. Well nourished.  Vascular Foot warm and well perfused. Capillary refill normal to all digits.   No calf pain with palpation  Neurologic Normal speech. Oriented to person, place, and time. Epicritic sensation absent to right foot  Dermatologic TMA site well-healed with no dehiscence erythema drainage or concern for residual infection.  Right heel fully healed no ulceration  Left foot small circular ulceration plantar forefoot which is circular and appears to be healing in no significant fibrotic tissues there is some hyperkeratotic tissue buildup around the wound margin.  Healthy wound base with granular tissue.   Orthopedic: Status post right foot TMA and tendo Achilles lengthening   Radiographs: Interval transmetatarsal amputation of the right foot  Pathology:  Skin with ulcer and necrotizing inflammation  Micro: C. Striatum  Assessment:   Osteomyelitis of right foot status post transmetatarsal amputation and tendo Achilles lengthening and ulceration to subcutaneous fat level status postdebridement on the left foot  Plan:  Patient was evaluated and treated and all questions answered.  3 mo s/p right foot transmetatarsal amputation and tendo Achilles lengthening and left foot ulcer  debridement and graft application -Progressing as expected postoperatively with good healing of the right foot transmetatarsal amputation site as well as the left foot ulceration -Right heel ulceration fully healed -Left foot continue daily dressing change with Iodosorb gel and bandage.  Printed off prescription for Iodosorb as well as wrote instructions for her nursing facility -WB Status: WBAT BLE in post op shoe -Medications/ABX: No antibiotics indicated -Dressings changed.  -Follow-up in 6 to 8 weeks        Joy Tapia, DPM [facility] / [facility]

## 2023-09-23 ENCOUNTER — Other Ambulatory Visit: Payer: Self-pay | Admitting: Podiatry

## 2023-10-01 ENCOUNTER — Emergency Department (HOSPITAL_COMMUNITY)

## 2023-10-01 ENCOUNTER — Inpatient Hospital Stay (HOSPITAL_COMMUNITY)
Admission: EM | Admit: 2023-10-01 | Discharge: 2023-10-08 | DRG: 988 | Disposition: A | Discharge status: Skilled Nursing Facility | Source: Skilled Nursing Facility | Attending: Internal Medicine | Admitting: Internal Medicine

## 2023-10-01 ENCOUNTER — Encounter (HOSPITAL_COMMUNITY): Payer: Self-pay

## 2023-10-01 ENCOUNTER — Other Ambulatory Visit: Payer: Self-pay

## 2023-10-01 ENCOUNTER — Encounter: Payer: Self-pay | Admitting: Podiatry

## 2023-10-01 DIAGNOSIS — L02612 Cutaneous abscess of left foot: Secondary | ICD-10-CM | POA: Diagnosis present

## 2023-10-01 DIAGNOSIS — B952 Enterococcus as the cause of diseases classified elsewhere: Secondary | ICD-10-CM | POA: Diagnosis present

## 2023-10-01 DIAGNOSIS — Z79899 Other long term (current) drug therapy: Secondary | ICD-10-CM

## 2023-10-01 DIAGNOSIS — N179 Acute kidney failure, unspecified: Secondary | ICD-10-CM | POA: Diagnosis present

## 2023-10-01 DIAGNOSIS — Z888 Allergy status to other drugs, medicaments and biological substances status: Secondary | ICD-10-CM | POA: Diagnosis not present

## 2023-10-01 DIAGNOSIS — E1165 Type 2 diabetes mellitus with hyperglycemia: Secondary | ICD-10-CM | POA: Diagnosis present

## 2023-10-01 DIAGNOSIS — K529 Noninfective gastroenteritis and colitis, unspecified: Secondary | ICD-10-CM | POA: Diagnosis present

## 2023-10-01 DIAGNOSIS — E1169 Type 2 diabetes mellitus with other specified complication: Secondary | ICD-10-CM | POA: Diagnosis present

## 2023-10-01 DIAGNOSIS — L089 Local infection of the skin and subcutaneous tissue, unspecified: Secondary | ICD-10-CM

## 2023-10-01 DIAGNOSIS — B9689 Other specified bacterial agents as the cause of diseases classified elsewhere: Secondary | ICD-10-CM | POA: Diagnosis not present

## 2023-10-01 DIAGNOSIS — I152 Hypertension secondary to endocrine disorders: Secondary | ICD-10-CM | POA: Diagnosis present

## 2023-10-01 DIAGNOSIS — E785 Hyperlipidemia, unspecified: Secondary | ICD-10-CM | POA: Diagnosis present

## 2023-10-01 DIAGNOSIS — E1122 Type 2 diabetes mellitus with diabetic chronic kidney disease: Secondary | ICD-10-CM | POA: Diagnosis present

## 2023-10-01 DIAGNOSIS — Z7982 Long term (current) use of aspirin: Secondary | ICD-10-CM | POA: Diagnosis not present

## 2023-10-01 DIAGNOSIS — Z6821 Body mass index (BMI) 21.0-21.9, adult: Secondary | ICD-10-CM

## 2023-10-01 DIAGNOSIS — E11621 Type 2 diabetes mellitus with foot ulcer: Secondary | ICD-10-CM | POA: Diagnosis present

## 2023-10-01 DIAGNOSIS — L03116 Cellulitis of left lower limb: Principal | ICD-10-CM | POA: Diagnosis present

## 2023-10-01 DIAGNOSIS — N1832 Chronic kidney disease, stage 3b: Secondary | ICD-10-CM | POA: Diagnosis present

## 2023-10-01 DIAGNOSIS — M810 Age-related osteoporosis without current pathological fracture: Secondary | ICD-10-CM | POA: Diagnosis present

## 2023-10-01 DIAGNOSIS — Z881 Allergy status to other antibiotic agents status: Secondary | ICD-10-CM

## 2023-10-01 DIAGNOSIS — Z794 Long term (current) use of insulin: Secondary | ICD-10-CM

## 2023-10-01 DIAGNOSIS — Z96651 Presence of right artificial knee joint: Secondary | ICD-10-CM | POA: Diagnosis present

## 2023-10-01 DIAGNOSIS — Z9049 Acquired absence of other specified parts of digestive tract: Secondary | ICD-10-CM

## 2023-10-01 DIAGNOSIS — E440 Moderate protein-calorie malnutrition: Secondary | ICD-10-CM | POA: Diagnosis present

## 2023-10-01 DIAGNOSIS — Z91048 Other nonmedicinal substance allergy status: Secondary | ICD-10-CM

## 2023-10-01 DIAGNOSIS — L97529 Non-pressure chronic ulcer of other part of left foot with unspecified severity: Secondary | ICD-10-CM | POA: Diagnosis present

## 2023-10-01 DIAGNOSIS — E11628 Type 2 diabetes mellitus with other skin complications: Secondary | ICD-10-CM | POA: Diagnosis present

## 2023-10-01 DIAGNOSIS — N189 Chronic kidney disease, unspecified: Secondary | ICD-10-CM | POA: Diagnosis present

## 2023-10-01 DIAGNOSIS — T148XXA Other injury of unspecified body region, initial encounter: Secondary | ICD-10-CM | POA: Diagnosis present

## 2023-10-01 DIAGNOSIS — I10 Essential (primary) hypertension: Secondary | ICD-10-CM | POA: Diagnosis not present

## 2023-10-01 DIAGNOSIS — Z87891 Personal history of nicotine dependence: Secondary | ICD-10-CM | POA: Diagnosis not present

## 2023-10-01 DIAGNOSIS — R739 Hyperglycemia, unspecified: Secondary | ICD-10-CM

## 2023-10-01 DIAGNOSIS — F03A0 Unspecified dementia, mild, without behavioral disturbance, psychotic disturbance, mood disturbance, and anxiety: Secondary | ICD-10-CM | POA: Diagnosis present

## 2023-10-01 DIAGNOSIS — L02416 Cutaneous abscess of left lower limb: Secondary | ICD-10-CM | POA: Diagnosis not present

## 2023-10-01 DIAGNOSIS — E1159 Type 2 diabetes mellitus with other circulatory complications: Secondary | ICD-10-CM | POA: Diagnosis present

## 2023-10-01 DIAGNOSIS — E119 Type 2 diabetes mellitus without complications: Secondary | ICD-10-CM | POA: Diagnosis not present

## 2023-10-01 DIAGNOSIS — Z89421 Acquired absence of other right toe(s): Secondary | ICD-10-CM

## 2023-10-01 LAB — I-STAT CG4 LACTIC ACID, ED
Lactic Acid, Venous: 3 mmol/L (ref 0.5–1.9)
Lactic Acid, Venous: 1.9 mmol/L (ref 0.5–1.9)

## 2023-10-01 LAB — CBC WITH DIFFERENTIAL/PLATELET
Abs Immature Granulocytes: 0.13 10*3/uL — ABNORMAL HIGH (ref 0.00–0.07)
Basophils Absolute: 0.1 10*3/uL (ref 0.0–0.1)
Basophils Relative: 0 %
Eosinophils Absolute: 0 10*3/uL (ref 0.0–0.5)
Eosinophils Relative: 0 %
HCT: 46 % (ref 36.0–46.0)
Hemoglobin: 15.1 g/dL — ABNORMAL HIGH (ref 12.0–15.0)
Immature Granulocytes: 1 %
Lymphocytes Relative: 8 %
Lymphs Abs: 1.3 10*3/uL (ref 0.7–4.0)
MCH: 27.9 pg (ref 26.0–34.0)
MCHC: 32.8 g/dL (ref 30.0–36.0)
MCV: 84.9 fL (ref 80.0–100.0)
Monocytes Absolute: 1.1 10*3/uL — ABNORMAL HIGH (ref 0.1–1.0)
Monocytes Relative: 7 %
Neutro Abs: 13 10*3/uL — ABNORMAL HIGH (ref 1.7–7.7)
Neutrophils Relative %: 84 %
Platelets: 205 10*3/uL (ref 150–400)
RBC: 5.42 MIL/uL — ABNORMAL HIGH (ref 3.87–5.11)
RDW: 15.8 % — ABNORMAL HIGH (ref 11.5–15.5)
WBC: 15.5 10*3/uL — ABNORMAL HIGH (ref 4.0–10.5)
nRBC: 0 % (ref 0.0–0.2)

## 2023-10-01 LAB — GLUCOSE, CAPILLARY
Glucose-Capillary: 381 mg/dL — ABNORMAL HIGH (ref 70–99)
Glucose-Capillary: 363 mg/dL — ABNORMAL HIGH (ref 70–99)

## 2023-10-01 LAB — BETA-HYDROXYBUTYRIC ACID: Beta-Hydroxybutyric Acid: 0.37 mmol/L — ABNORMAL HIGH (ref 0.05–0.27)

## 2023-10-01 LAB — COMPREHENSIVE METABOLIC PANEL WITH GFR
ALT: 21 U/L (ref 0–44)
AST: 28 U/L (ref 15–41)
Albumin: 3 g/dL — ABNORMAL LOW (ref 3.5–5.0)
Alkaline Phosphatase: 109 U/L (ref 38–126)
Anion gap: 17 — ABNORMAL HIGH (ref 5–15)
BUN: 23 mg/dL (ref 8–23)
CO2: 23 mmol/L (ref 22–32)
Calcium: 8.9 mg/dL (ref 8.9–10.3)
Chloride: 86 mmol/L — ABNORMAL LOW (ref 98–111)
Creatinine, Ser: 1.85 mg/dL — ABNORMAL HIGH (ref 0.44–1.00)
GFR, Estimated: 27 mL/min — ABNORMAL LOW (ref >60)
Glucose, Bld: 553 mg/dL (ref 70–99)
Potassium: 4.4 mmol/L (ref 3.5–5.1)
Sodium: 126 mmol/L — ABNORMAL LOW (ref 135–145)
Total Bilirubin: 0.9 mg/dL (ref 0.0–1.2)
Total Protein: 7.2 g/dL (ref 6.5–8.1)

## 2023-10-01 LAB — PROTIME-INR
INR: 1.2 (ref 0.8–1.2)
Prothrombin Time: 14.9 s (ref 11.4–15.2)

## 2023-10-01 MED ORDER — INSULIN ASPART 100 UNIT/ML IJ SOLN
5.0000 [IU] | Freq: Once | INTRAMUSCULAR | Status: AC
  Administered 2023-10-01: 5 [IU] via SUBCUTANEOUS

## 2023-10-01 MED ORDER — ONDANSETRON HCL 4 MG/2ML IJ SOLN
4.0000 mg | Freq: Four times a day (QID) | INTRAMUSCULAR | Status: DC | PRN
Start: 2023-10-01 — End: 2023-10-08

## 2023-10-01 MED ORDER — VANCOMYCIN HCL IN DEXTROSE 1-5 GM/200ML-% IV SOLN: 1000.0000 mg | Freq: Once | INTRAVENOUS | Status: DC

## 2023-10-01 MED ORDER — ACETAMINOPHEN 325 MG PO TABS
650.0000 mg | ORAL_TABLET | Freq: Four times a day (QID) | ORAL | Status: DC | PRN
  Administered 2023-10-01 – 2023-10-06 (×5): 650 mg via ORAL
  Filled 2023-10-01 (×6): qty 2

## 2023-10-01 MED ORDER — SODIUM CHLORIDE 0.9 % IV SOLN
3.0000 g | Freq: Once | INTRAVENOUS | Status: AC
  Administered 2023-10-01: 3 g via INTRAVENOUS
  Filled 2023-10-01: qty 8

## 2023-10-01 MED ORDER — HEPARIN SODIUM (PORCINE) 5000 UNIT/ML IJ SOLN
5000.0000 [IU] | Freq: Three times a day (TID) | INTRAMUSCULAR | Status: DC
  Administered 2023-10-01 – 2023-10-08 (×20): 5000 [IU] via SUBCUTANEOUS
  Filled 2023-10-01 (×21): qty 1

## 2023-10-01 MED ORDER — ACETAMINOPHEN 650 MG RE SUPP: 650.0000 mg | Freq: Four times a day (QID) | RECTAL | Status: DC | PRN

## 2023-10-01 MED ORDER — LACTATED RINGERS IV BOLUS: 1000.0000 mL | Freq: Once | INTRAVENOUS | Status: DC

## 2023-10-01 MED ORDER — METOPROLOL SUCCINATE ER 50 MG PO TB24
50.0000 mg | ORAL_TABLET | Freq: Every morning | ORAL | Status: DC
Start: 2023-10-02 — End: 2023-10-08
  Administered 2023-10-02 – 2023-10-08 (×6): 50 mg via ORAL
  Filled 2023-10-01 (×6): qty 1

## 2023-10-01 MED ORDER — INSULIN ASPART 100 UNIT/ML IJ SOLN
0.0000 [IU] | Freq: Every day | INTRAMUSCULAR | Status: DC
  Administered 2023-10-01 – 2023-10-02 (×2): 5 [IU] via SUBCUTANEOUS
  Administered 2023-10-04: 2 [IU] via SUBCUTANEOUS
  Administered 2023-10-05: 3 [IU] via SUBCUTANEOUS

## 2023-10-01 MED ORDER — INSULIN ASPART 100 UNIT/ML IJ SOLN
0.0000 [IU] | Freq: Three times a day (TID) | INTRAMUSCULAR | Status: DC
  Administered 2023-10-02: 5 [IU] via SUBCUTANEOUS
  Administered 2023-10-02 (×2): 7 [IU] via SUBCUTANEOUS
  Administered 2023-10-03 (×2): 3 [IU] via SUBCUTANEOUS
  Administered 2023-10-03: 5 [IU] via SUBCUTANEOUS
  Administered 2023-10-04: 7 [IU] via SUBCUTANEOUS
  Administered 2023-10-04: 5 [IU] via SUBCUTANEOUS
  Administered 2023-10-04: 3 [IU] via SUBCUTANEOUS
  Administered 2023-10-05: 1 [IU] via SUBCUTANEOUS
  Administered 2023-10-05 – 2023-10-06 (×3): 3 [IU] via SUBCUTANEOUS
  Administered 2023-10-06: 1 [IU] via SUBCUTANEOUS
  Administered 2023-10-06: 5 [IU] via SUBCUTANEOUS
  Administered 2023-10-07: 3 [IU] via SUBCUTANEOUS
  Administered 2023-10-07: 1 [IU] via SUBCUTANEOUS
  Administered 2023-10-08: 2 [IU] via SUBCUTANEOUS

## 2023-10-01 MED ORDER — PANTOPRAZOLE SODIUM 40 MG PO TBEC
40.0000 mg | DELAYED_RELEASE_TABLET | Freq: Every day | ORAL | Status: DC
  Administered 2023-10-02 – 2023-10-08 (×7): 40 mg via ORAL
  Filled 2023-10-01 (×7): qty 1

## 2023-10-01 MED ORDER — VANCOMYCIN HCL 750 MG/150ML IV SOLN
750.0000 mg | INTRAVENOUS | Status: DC
  Administered 2023-10-01 – 2023-10-03 (×2): 750 mg via INTRAVENOUS
  Filled 2023-10-01 (×2): qty 150

## 2023-10-01 MED ORDER — SODIUM CHLORIDE 0.9 % IV BOLUS
1000.0000 mL | Freq: Once | INTRAVENOUS | Status: AC
  Administered 2023-10-01: 1000 mL via INTRAVENOUS

## 2023-10-01 MED ORDER — INSULIN GLARGINE-YFGN 100 UNIT/ML ~~LOC~~ SOLN
5.0000 [IU] | Freq: Every day | SUBCUTANEOUS | Status: DC
  Administered 2023-10-01: 5 [IU] via SUBCUTANEOUS
  Filled 2023-10-01 (×2): qty 0.05

## 2023-10-01 MED ORDER — SENNOSIDES-DOCUSATE SODIUM 8.6-50 MG PO TABS: 1.0000 | ORAL_TABLET | Freq: Every evening | ORAL | Status: DC | PRN

## 2023-10-01 MED ORDER — SODIUM CHLORIDE 0.9 % IV SOLN
3.0000 g | Freq: Two times a day (BID) | INTRAVENOUS | Status: DC
  Administered 2023-10-02 – 2023-10-04 (×6): 3 g via INTRAVENOUS
  Filled 2023-10-01 (×6): qty 8

## 2023-10-01 MED ORDER — PRAVASTATIN SODIUM 40 MG PO TABS
20.0000 mg | ORAL_TABLET | Freq: Every day | ORAL | Status: DC
  Administered 2023-10-01 – 2023-10-07 (×7): 20 mg via ORAL
  Filled 2023-10-01 (×7): qty 1

## 2023-10-01 MED ORDER — ONDANSETRON HCL 4 MG PO TABS: 4.0000 mg | ORAL_TABLET | Freq: Four times a day (QID) | ORAL | Status: DC | PRN

## 2023-10-01 MED ORDER — LACTATED RINGERS IV SOLN: INTRAVENOUS | Status: AC

## 2023-10-01 NOTE — Hospital Course (Signed)
 Joy Tapia is a 83 y.o. female with medical history significant for T2DM, CKD stage IIIb, HTN, HLD, gout, osteomyelitis of right foot s/p TMA, mild dementia who is admitted with diabetic left foot wound with associated cellulitis.
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
## 2023-10-01 NOTE — ED Triage Notes (Signed)
 Pt bib ems from [facility]; staff called out for infection of L foot; also endorses ams x 3 days; cbg 483; hx dm; 122/64, p 67, 97% RA, 18 RR; T 98.1 F; pt has no complaints; ; wound being followed by wound care nurse, concerned that wound not healing; per staff, pt unsure of place, which is abnormal for her; hx dementia

## 2023-10-01 NOTE — ED Notes (Signed)
 Patient transported to MRI

## 2023-10-01 NOTE — ED Notes (Signed)
 IV team at bedside

## 2023-10-01 NOTE — ED Provider Notes (Signed)
 [place] EMERGENCY DEPARTMENT AT [facility] Provider Note   CSN: [id number] Arrival date & time: 10/01/23  1453     History  No chief complaint on file.   Joy Tapia is a 83 y.o. female.  Patient is an 83 year old female with a past medical history of dementia, diabetes, hypertension, CKD presenting to the emergency department from her nursing home for conceding of left foot wound infection.  Patient had a wound that had been followed by podiatry and recently the wound has worsened.  The patient states that she is starting to develop some pain on the bottom of her foot.  She states that recently her wound care nurses started to notice some redness of her foot as well.  She denies any fever or chills, nausea, vomiting or abdominal pain.  She states that she is unsure if they have started her on any outpatient antibiotics.  The history is provided by the patient and the EMS personnel. The history is limited by the condition of the patient (Level 5 caveat for dementia).       Home Medications Prior to Admission medications   Medication Sig Start Date End Date Taking? Authorizing Provider  acetaminophen  (TYLENOL ) 325 MG tablet Take 650 mg by mouth every 8 (eight) hours as needed for mild pain (pain score 1-3) or moderate pain (pain score 4-6).    [provider]  albuterol  (VENTOLIN  HFA) 108 (90 Base) MCG/ACT inhaler Inhale 2 puffs into the lungs every 4 (four) hours as needed for wheezing or shortness of breath. 05/07/23   [provider]  aspirin  EC 81 MG tablet Take 81 mg by mouth daily.    [provider]  cadexomer iodine  (IODOSORB) 0.9 % gel Apply 1 Application topically daily as needed for wound care. 09/18/23   Standiford, Karlene Overcast, DPM  cetirizine (ZYRTEC) 10 MG tablet Take 10 mg by mouth daily.    [provider]  Febuxostat  80 MG TABS Take 1 tablet by mouth daily.    [provider]  fluticasone  (FLONASE ) 50  MCG/ACT nasal spray Place 1 spray into both nostrils daily as needed for allergies or rhinitis.    [provider]  furosemide  (LASIX ) 40 MG tablet Take 1 tablet (40 mg total) by mouth daily. 04/29/23   Regalado, Belkys A, MD  insulin  detemir (LEVEMIR ) 100 UNIT/ML injection Inject 0.06 mLs (6 Units total) into the skin daily. 06/19/23   Oral Billings, MD  Insulin  Syringe-Needle U-100 (INSULIN  SYRINGE .5CC/28G) 28G X 1/2" 0.5 ML MISC 1 Application by Does not apply route daily. 04/29/23   Regalado, Belkys A, MD  liver oil-zinc  oxide (DESITIN) 40 % ointment Apply topically every 6 (six) hours as needed for irritation. 08/07/23   Magdalene School, MD  metoprolol  succinate (TOPROL -XL) 50 MG 24 hr tablet Take 50 mg by mouth daily.    [provider]  Multiple Vitamin (MULTIVITAMIN) capsule Take 1 capsule by mouth daily.    [provider]  mupirocin  ointment (BACTROBAN ) 2 % Apply topically every other day. 08/07/23   Magdalene School, MD  omeprazole (PRILOSEC) 20 MG capsule Take 20 mg by mouth daily.    [provider]  oxyCODONE  (OXY IR/ROXICODONE ) 5 MG immediate release tablet Take 1 tablet (5 mg total) by mouth every 6 (six) hours as needed for moderate pain (pain score 4-6). 06/19/23   Oral Billings, MD  oxymetazoline (AFRIN) 0.05 % nasal spray Place 1 spray into both nostrils 2 (two)  times daily as needed for congestion.    [provider]  pravastatin  (PRAVACHOL ) 20 MG tablet Take 20 mg by mouth at bedtime.    [provider]  vitamin B-12 (CYANOCOBALAMIN) 500 MCG tablet Take 500 mcg by mouth daily. 09/01/23   [provider]      Allergies    Atorvastatin, Azithromycin, Erythromycin, Other, and Propoxyphene    Review of Systems   Review of Systems  Physical Exam Updated Vital Signs BP 123/60   Pulse 72   Temp 98.6 F (37 C) (Oral)   Resp 16   SpO2 99%  Physical Exam Vitals and nursing note reviewed.  Constitutional:       General: She is not in acute distress.    Appearance: Normal appearance.  HENT:     Head: Normocephalic and atraumatic.     Nose: Nose normal.     Mouth/Throat:     Mouth: Mucous membranes are moist.     Pharynx: Oropharynx is clear.  Eyes:     Extraocular Movements: Extraocular movements intact.     Conjunctiva/sclera: Conjunctivae normal.  Cardiovascular:     Rate and Rhythm: Normal rate and regular rhythm.     Heart sounds: Normal heart sounds.  Pulmonary:     Effort: Pulmonary effort is normal.     Breath sounds: Normal breath sounds.  Abdominal:     General: Abdomen is flat.     Palpations: Abdomen is soft.     Tenderness: There is no abdominal tenderness.  Musculoskeletal:        General: Normal range of motion.     Comments: Amputation of metatarsals of R foot  Skin:    General: Skin is warm and dry.     Comments: Purulent appearing wound between 1st and 2nd toes of L foot with surrounding erythema, warmth and edema to L ankle  Neurological:     General: No focal deficit present.     Mental Status: She is alert. Mental status is at baseline.  Psychiatric:        Mood and Affect: Mood normal.        Behavior: Behavior normal.          ED Results / Procedures / Treatments   Labs (all labs ordered are listed, but only abnormal results are displayed) Labs Reviewed  COMPREHENSIVE METABOLIC PANEL WITH GFR - Abnormal; Notable for the following components:      Result Value   Sodium 126 (*)    Chloride 86 (*)    Glucose, Bld 553 (*)    Creatinine, Ser 1.85 (*)    Albumin 3.0 (*)    GFR, Estimated 27 (*)    Anion gap 17 (*)    All other components within normal limits  CBC WITH DIFFERENTIAL/PLATELET - Abnormal; Notable for the following components:   WBC 15.5 (*)    RBC 5.42 (*)    Hemoglobin 15.1 (*)    RDW 15.8 (*)    Neutro Abs 13.0 (*)    Monocytes Absolute 1.1 (*)    Abs Immature Granulocytes 0.13 (*)    All other components within normal limits   BETA-HYDROXYBUTYRIC ACID - Abnormal; Notable for the following components:   Beta-Hydroxybutyric Acid 0.37 (*)    All other components within normal limits  I-STAT CG4 LACTIC ACID, ED - Abnormal; Notable for the following components:   Lactic Acid, Venous 3.0 (*)    All other components within normal limits  CULTURE, BLOOD (ROUTINE  X 2)  CULTURE, BLOOD (ROUTINE X 2)  PROTIME-INR  I-STAT CG4 LACTIC ACID, ED  CBG MONITORING, ED    EKG EKG Interpretation Date/Time:  Wednesday Oct 01 2023 15:15:48 EDT Ventricular Rate:  77 PR Interval:  131 QRS Duration:  92 QT Interval:  373 QTC Calculation: 423 R Axis:   21  Text Interpretation: Sinus rhythm LAE, consider biatrial enlargement No significant change since last tracing Confirmed by Celesta Coke ([id number]) on 10/01/2023 4:08:36 PM  Radiology DG Foot 2 Views Left Result Date: 10/01/2023 CLINICAL DATA:  Wound of the left foot. EXAM: LEFT FOOT - 2 VIEW COMPARISON:  Radiograph dated 08/04/2023. FINDINGS: There is no acute fracture or dislocation. The bones are osteopenic. There is soft tissue swelling of the lateral foot. No radiopaque foreign object or soft tissue gas. IMPRESSION: 1. No acute fracture or dislocation. 2. Soft tissue swelling of the lateral foot. Electronically Signed   By: Angus Bark M.D.   On: 10/01/2023 16:52    Procedures .Critical Care  Performed by: Kingsley, Shizuye Rupert K, DO Authorized by: Nolberto Batty, DO   Critical care provider statement:    Critical care time (minutes):  30   Critical care was necessary to treat or prevent imminent or life-threatening deterioration of the following conditions:  Sepsis   Critical care was time spent personally by me on the following activities:  Development of treatment plan with patient or surrogate, discussions with consultants, evaluation of patient's response to treatment, examination of patient, ordering and review of laboratory studies, ordering and review of  radiographic studies, ordering and performing treatments and interventions, pulse oximetry, re-evaluation of patient's condition and review of old charts     Medications Ordered in ED Medications  Ampicillin -Sulbactam (UNASYN ) 3 g in sodium chloride  0.9 % 100 mL IVPB (3 g Intravenous New Bag/Given 10/01/23 1739)    And  vancomycin  (VANCOCIN ) IVPB 1000 mg/200 mL premix (has no administration in time range)  insulin  aspart (novoLOG ) injection 5 Units (has no administration in time range)  sodium chloride  0.9 % bolus 1,000 mL (1,000 mLs Intravenous New Bag/Given 10/01/23 1737)    ED Course/ Medical Decision Making/ A&P Clinical Course as of 10/01/23 1822  Wed Oct 01, 2023  1607 I spoke with Dr. Demetria Finch of podiatry who recommended foot XR, MRI and vascular studies and will see tomorrow. Plan to admit to medicine once labs and XR is back. [VK]  1706 AKI with Cr 1.8 from baseline 1.5, mildly elevated AGAP. Will add on lactic. Receiving fluids for hyperglycemia. [VK]  1819 BHB normal, will give insulin . Plan to admit for sepsis from diabetic foot wound. [VK]    Clinical Course User Index [VK] Kingsley, Askari Kinley K, DO                                 Medical Decision Making This patient presents to the ED with chief complaint(s) of diabetic foot wound with pertinent past medical history of DM, HTN, dementia, CKD which further complicates the presenting complaint. The complaint involves an extensive differential diagnosis and also carries with it a high risk of complications and morbidity.    The differential diagnosis includes cellulitis, osteomyelitis, sepsis, DKA, other hyperglycemic crisis  Additional history obtained: Additional history obtained from EMS  Records reviewed Nursing Home Documents and outpatient podiatry records  ED Course and Reassessment: On patient's arrival she is hemodynamically stable in no acute distress, her foot  is warm and well-perfused but does have a wound  between the 1st and 2nd toes with significant erythema, warmth and edema concerning for cellulitis or other deep space infection.  Patient will have labs and x-ray of her foot and will be started on fluids and antibiotics and will be closely reassessed.  Independent labs interpretation:  The following labs were independently interpreted: leukocytosis and elevated lactic, hyperglycemia without DKA, mild AKI  Independent visualization of imaging: - I independently visualized the following imaging with scope of interpretation limited to determining acute life threatening conditions related to emergency care: L foot x-ray, which revealed no evidence of deep space infection  Consultation: - Consulted or discussed management/test interpretation w/ external professional: podiatry, hospitalist  Consideration for admission or further workup: patient requires admission for her cellulitis and uncontrolled hyperglycemia Social Determinants of health: N/A    Amount and/or Complexity of Data Reviewed Labs: ordered. Radiology: ordered.  Risk Prescription drug management. Decision regarding hospitalization.          Final Clinical Impression(s) / ED Diagnoses Final diagnoses:  Cellulitis of left lower extremity  Infected wound  Hyperglycemia  AKI (acute kidney injury) [facility])    Rx / DC Orders ED Discharge Orders     None         Kingsley, Tayvin Preslar K, DO 10/01/23 1822

## 2023-10-01 NOTE — Progress Notes (Signed)
 Called 5C to let RN patient is coming to Room 13. RN is not available . They asked me to call back in 15 minutes .

## 2023-10-01 NOTE — Progress Notes (Signed)
 Pharmacy Antibiotic Note  Joy Tapia is a 83 y.o. female admitted on 10/01/2023 with L foot  wound infection .  Pharmacy has been consulted for vancomycin  and Unasyn  dosing. Cr up on admit.  Plan: Vancomycin  750mg  IV q48h - est AUC 466 Unasyn  3g IV q12h  Height: 5' 2.01" (157.5 cm) Weight: 55.7 kg (122 lb 12.7 oz) IBW/kg (Calculated) : 50.12  Temp (24hrs), Avg:98.8 F (37.1 C), Min:98.6 F (37 C), Max:99.1 F (37.3 C)  Recent Labs  Lab 10/01/23 1530 10/01/23 1609 10/01/23 1758  WBC 15.5*  --   --   CREATININE 1.85*  --   --   LATICACIDVEN  --  3.0* 1.9    Estimated Creatinine Clearance: 18.9 mL/min (A) (by C-G formula based on SCr of 1.85 mg/dL (H)).    Allergies  Allergen Reactions   Atorvastatin Diarrhea    Allergy not listed on MAR    Azithromycin Other (See Comments)    Pt states burns her insides. Allergy not listed on MAR     Erythromycin Other (See Comments)    Unknown. Allergy not listed on MAR    Other Other (See Comments)    Adhesive bandage, Anesthesia Extension set  -  unknown. Allergy not listed on MAR      Propoxyphene Other (See Comments)    Allergy not listed on MAR      Antimicrobials this admission: Unasyn  5/7 >> Vancomycin  5/7 >>  Levin Reamer, PharmD, BCPS, [facility] Clinical Pharmacist [phone] Please check AMION for all [facility] Pharmacy numbers 10/01/2023

## 2023-10-02 ENCOUNTER — Encounter (HOSPITAL_COMMUNITY)

## 2023-10-02 DIAGNOSIS — L03116 Cellulitis of left lower limb: Secondary | ICD-10-CM | POA: Diagnosis not present

## 2023-10-02 DIAGNOSIS — L02612 Cutaneous abscess of left foot: Secondary | ICD-10-CM | POA: Diagnosis not present

## 2023-10-02 LAB — GLUCOSE, CAPILLARY
Glucose-Capillary: 350 mg/dL — ABNORMAL HIGH (ref 70–99)
Glucose-Capillary: 257 mg/dL — ABNORMAL HIGH (ref 70–99)
Glucose-Capillary: 303 mg/dL — ABNORMAL HIGH (ref 70–99)
Glucose-Capillary: 424 mg/dL — ABNORMAL HIGH (ref 70–99)
Glucose-Capillary: 410 mg/dL — ABNORMAL HIGH (ref 70–99)

## 2023-10-02 LAB — CBC
HCT: 38.5 % (ref 36.0–46.0)
Hemoglobin: 12.4 g/dL (ref 12.0–15.0)
MCH: 27.6 pg (ref 26.0–34.0)
MCHC: 32.2 g/dL (ref 30.0–36.0)
MCV: 85.6 fL (ref 80.0–100.0)
Platelets: 200 10*3/uL (ref 150–400)
RBC: 4.5 MIL/uL (ref 3.87–5.11)
RDW: 15.8 % — ABNORMAL HIGH (ref 11.5–15.5)
WBC: 15.8 10*3/uL — ABNORMAL HIGH (ref 4.0–10.5)
nRBC: 0 % (ref 0.0–0.2)

## 2023-10-02 LAB — SEDIMENTATION RATE: Sed Rate: 94 mm/h — ABNORMAL HIGH (ref 0–22)

## 2023-10-02 MED ORDER — INSULIN ASPART 100 UNIT/ML IJ SOLN
3.0000 [IU] | Freq: Three times a day (TID) | INTRAMUSCULAR | Status: DC
Start: 2023-10-02 — End: 2023-10-03
  Administered 2023-10-02 (×2): 3 [IU] via SUBCUTANEOUS

## 2023-10-02 MED ORDER — RENA-VITE PO TABS
1.0000 | ORAL_TABLET | Freq: Every day | ORAL | Status: DC
  Administered 2023-10-02 – 2023-10-07 (×6): 1 via ORAL
  Filled 2023-10-02 (×6): qty 1

## 2023-10-02 MED ORDER — GLUCERNA SHAKE PO LIQD
237.0000 mL | Freq: Two times a day (BID) | ORAL | Status: DC
  Administered 2023-10-02 – 2023-10-08 (×9): 237 mL via ORAL

## 2023-10-02 MED ORDER — INSULIN GLARGINE-YFGN 100 UNIT/ML ~~LOC~~ SOLN
10.0000 [IU] | Freq: Every day | SUBCUTANEOUS | Status: DC
  Administered 2023-10-02: 10 [IU] via SUBCUTANEOUS
  Filled 2023-10-02 (×2): qty 0.1

## 2023-10-02 NOTE — Consult Note (Addendum)
 PODIATRY CONSULTATION  NAME TYJANAE CARRERAS MRN [id number] DOB 09/25/1940 DOA 10/01/2023   Reason for consult: Left foot cellulitis  Attending/Consulting physician: C. Gherghe MD  History of present illness: "Joy Tapia is a 83 y.o. female with medical history significant for T2DM, CKD stage IIIb, HTN, HLD, gout, osteomyelitis of right foot s/p TMA, mild dementia who presented to the ED from ALF for evaluation of worsening left foot infection.   Patient resides at [facility].  She has been receiving wound care for management of a wound to her left foot.  It was noted that the plantar wound between the base of her 1st and 2nd toes has become more erythematous and hot to touch with purulent discharge.  Staff at her facility have noted that she has been more fatigued and less interactive than baseline.  She was sent to the ED for further evaluation.   Patient reports some pain in her left foot but otherwise denies fevers, chills, diaphoresis.  She denies chest pain or dyspnea."  Patient known to me from clinic.  Had previously done transmetatarsal amputation to the right foot.  This is healed completely without any issues.  She has had a longstanding wound on the left plantar forefoot that actually appears to be improving with local wound care.  She has new concern for wound in the first webspace as well as significant cellulitis related to such.  Sent in from nursing home due to the redness and swelling.  She denies much pain.  Past Medical History:  Diagnosis Date   Deep vein thrombosis (DVT) (HCC)    Dementia (HCC)    Diabetes mellitus without complication (HCC)    Foot fracture, right    GERD (gastroesophageal reflux disease)    Gout    Hoarding disorder    Hyperlipidemia    Hypertension    Hypertension in stage 4 chronic kidney disease due to type 2 diabetes mellitus (HCC)    Migraines    Mild cognitive impairment    Non-compliance    Osteoarthritis     Osteoporosis, post-menopausal    Pneumonia        Latest Ref Rng & Units 10/02/2023    6:15 AM 10/01/2023    3:30 PM 08/07/2023    4:21 AM  CBC  WBC 4.0 - 10.5 K/uL 15.8  15.5  7.0   Hemoglobin 12.0 - 15.0 g/dL 78.2  95.6  21.3   Hematocrit 36.0 - 46.0 % 38.5  46.0  35.6   Platelets 150 - 400 K/uL 200  205  202        Latest Ref Rng & Units 10/01/2023    3:30 PM 08/07/2023    4:21 AM 08/06/2023    4:23 AM  BMP  Glucose 70 - 99 mg/dL 086  578  469   BUN 8 - 23 mg/dL 23  27  18    Creatinine 0.44 - 1.00 mg/dL 6.29  5.28  4.13   Sodium 135 - 145 mmol/L 126  133  131   Potassium 3.5 - 5.1 mmol/L 4.4  3.6  3.8   Chloride 98 - 111 mmol/L 86  104  99   CO2 22 - 32 mmol/L 23  18  21    Calcium  8.9 - 10.3 mg/dL 8.9  8.5  8.1       Physical Exam: Lower Extremity Exam Significant erythema and edema of the left forefoot. There is a wound in the first webspace unclear the depth it is  tender to palpation. At the base of the wound there is an area that appears to be superficial abscess/phlegmonous change. Nonpalpable DP and PT pulses on the left foot Toes do have rapid cap refill and are warm  Previously treated ulceration at the plantar aspect of the second metatarsal head appears to be healing in nicely without evidence of infection at this area.        ASSESSMENT/PLAN OF CARE 83 y.o. female with PMHx significant for  T2DM, CKD stage IIIb, HTN, HLD, gout, osteomyelitis of right foot s/p TMA, mild dementia  with left foot cellulitis and concern for abscess in the first webspace  WBC 15.8 MRI left foot: Limited due to motion however no obvious evidence of osteomyelitis.  Cutaneous irregularity in the first webspace at the base of the 1st and 2nd proximal phalanges   -N.p.o. past midnight for the OR tomorrow morning for left foot irrigation debridement of ulceration and I&D of any abscess present in the first webspace.  Possible bone biopsy possible graft.  Patient is agreement to  proceed - Continue IV abx broad spectrum pending further culture data - Anticoagulation: Okay to continue preoperatively - Wound care: Betadine dressing to left foot preop - WB status: Weightbearing as tolerated in postop shoe - Will continue to follow   Thank you for the consult.  Please contact me directly with any questions or concerns.           Maridee Shoemaker, DPM [facility] / [facility]    [address] [place], [place] [address]                Office [phone]  Fax [phone]

## 2023-10-02 NOTE — Progress Notes (Signed)
 Initial Nutrition Assessment  DOCUMENTATION CODES:   Non-severe (moderate) malnutrition in context of chronic illness  INTERVENTION:   - Liberalize diet to Carb Modified (no renal or fluid restriction)  - Glucerna Shake po BID, each supplement provides 220 kcal and 10 grams of protein  - 1 packet Juven BID to support wound healing, each packet provides 95 calories, 2.5 grams of protein (collagen), and 9.8 grams of carbohydrate  - Renal MVI daily  NUTRITION DIAGNOSIS:   Moderate Malnutrition related to chronic illness (CKD IIIb, mild dementia) as evidenced by mild fat depletion, moderate muscle depletion.  GOAL:   Patient will meet greater than or equal to 90% of their needs  MONITOR:   PO intake, Supplement acceptance, Labs, Weight trends, Skin  REASON FOR ASSESSMENT:   Consult Wound healing  ASSESSMENT:   83 year old female who presented to the ED from ALF on 10/01/23 with worsening left foot infection. PMH of T2DM, CKD stage IIIb, HTN, HLD, gout, osteomyelitis of right foot s/p TMA, mild dementia. Pt admitted with left foot diabetic wound with cellulitis, AKI on CKD IIIb.  Noted plan for L foot I&D tomorrow morning. Pt will be NPO at midnight.  Spoke with pt at bedside. Pt reports appetite is fine and at baseline. Pt reports typically eating 3 meals daily at the ALF where she resides. Her favorite meal is the chicken.  Pt denies any recent weight changes. Noted pt with a 0.8 kg (1.4%) weight loss since 08/14/23 which is not significant for timeframe. However, pt with moderate pitting edema to BLE which may be making weight appear falsely elevated.  Discussed with pt the importance of adequate nutrition as it relates to wound healing. Pt willing to try an oral nutrition supplement. Will order Glucerna Shake at present given persistently high CBG's. Will also order Juven BID to support wound healing. Pt willing to taking MVI. Will order renal MVI given CKD stage IIIb.  Pt  meets criteria for moderate malnutrition based on NFPE. Pt originally on a renal/carb modified diet. Liberalized diet to carb modified to provide additional options at meals.  Medications reviewed and include: SSI, semglee  5 units daily, protonix , pravastatin , IV abx  Labs reviewed: sodium 126, chloride 86, creatinine 1.85, WBC 15.8, hemoglobin A1C 9.2 on 06/13/23 CBG's: 257-381 x 24 hours  NUTRITION - FOCUSED PHYSICAL EXAM:  Flowsheet Row Most Recent Value  Orbital Region Mild depletion  Upper Arm Region No depletion  Thoracic and Lumbar Region No depletion  Buccal Region Mild depletion  Temple Region No depletion  Clavicle Bone Region Mild depletion  Clavicle and Acromion Bone Region Mild depletion  Scapular Bone Region No depletion  Dorsal Hand Mild depletion  Patellar Region Mild depletion  Anterior Thigh Region Moderate depletion  Posterior Calf Region Moderate depletion  Edema (RD Assessment) Moderate  [BLE]  Hair Reviewed  Eyes Reviewed  Mouth Reviewed  Skin Reviewed  Nails Reviewed    Diet Order:   Diet Order             Diet NPO time specified Except for: Sips with Meds  Diet effective midnight           Diet Carb Modified Fluid consistency: Thin; Room service appropriate? Yes  Diet effective now                   EDUCATION NEEDS:   Education needs have been addressed  Skin:  Skin Assessment: Skin Integrity Issues: Diabetic Ulcer: L foot diabetic wound with cellulitis  Last BM:  no documented BM  Height:   Ht Readings from Last 1 Encounters:  10/01/23 5' 2.01" (1.575 m)    Weight:   Wt Readings from Last 1 Encounters:  10/01/23 55.7 kg    BMI:  Body mass index is 22.45 kg/m.  Estimated Nutritional Needs:   Kcal:  1650-1850  Protein:  75-90 grams  Fluid:  1.6-1.8 L    Ernestina Headland, MS, RD, LDN Registered Dietitian II Please see AMiON for contact information.

## 2023-10-02 NOTE — Progress Notes (Signed)
 PROGRESS NOTE  Joy Tapia ZOX:[id number] DOB: Oct 16, 1940 DOA: 10/01/2023 PCP: [facility]   LOS: 1 day   Brief Narrative / Interim history: 83 year old female with history of DM 2, CKD 3B, HTN, HLD, osteomyelitis of the right foot status post TMA, mild dementia who comes into the hospital from ALF for worsening left foot infection.  She resides at [facility], and has been having left foot wound that has not been healing.  She was found to have a white count of 15.5 in the ER, and an MRI did not show any acute osteomyelitis but it was not a good exam and it was motion degraded.  Podiatry was consulted, she was admitted to the hospital and given antibiotics  Subjective / 24h Interval events: She is doing well this morning, feeling well.  Denies any pain in her foot.  No abdominal pain, no nausea or vomiting.  No chest discomfort  Assesement and Plan: Principal Problem:   Cellulitis of left foot Active Problems:   Type 2 diabetes mellitus with hyperglycemia, with long-term current use of insulin  (HCC)   Acute kidney injury superimposed on chronic kidney disease (HCC)   Hypertension associated with diabetes (HCC)   Hyperlipidemia associated with type 2 diabetes mellitus (HCC)  Principal problem Diabetic wound/cellulitis of the left foot -worsening wound with associated cellulitis and leukocytosis.  MRI limited due to motion degradation but no acute osteomyelitis seen.  Patient was started on antibiotics - ABIs pending - Podiatry consult pending as well, follow cultures  Active problems Acute kidney injury on CKD stage IIIb-creatinine 1.8 on admission, baseline 1.4-1.5.  Has been placed on fluids, repeat renal function  Essential hypertension-continue Toprol   Hyperlipidemia-continue statin  Mild dementia-delirium precautions  History of gout-holding febuxostat   DM2, insulin -dependent, poorly controlled, with hyperglycemia -patient severely hyperglycemic on  admission.  She tells me she is not checking her sugars regularly at home.  Continue glargine as well as sliding scale, increase doses today  Lab Results  Component Value Date   HGBA1C 9.2 (H) 06/13/2023   CBG (last 3)  Recent Labs    10/01/23 2045 10/01/23 2232 10/02/23 0743  GLUCAP 381* 363* 350*    Scheduled Meds:  heparin  5,000 Units Subcutaneous Q8H   insulin  aspart  0-5 Units Subcutaneous QHS   insulin  aspart  0-9 Units Subcutaneous TID WC   insulin  glargine-yfgn  5 Units Subcutaneous QHS   metoprolol  succinate  50 mg Oral q morning   pantoprazole   40 mg Oral Daily   pravastatin   20 mg Oral QHS   Continuous Infusions:  ampicillin -sulbactam (UNASYN ) IV 3 g (10/02/23 0916)   vancomycin  750 mg (10/01/23 2223)   PRN Meds:.acetaminophen  **OR** acetaminophen , ondansetron  **OR** ondansetron  (ZOFRAN ) IV, senna-docusate  Current Outpatient Medications  Medication Instructions   acetaminophen  (TYLENOL ) 650 mg, Every 8 hours PRN   albuterol  (VENTOLIN  HFA) 108 (90 Base) MCG/ACT inhaler 2 puffs, Every 4 hours PRN   aspirin  EC 81 mg, Every morning   cadexomer iodine  (IODOSORB) 0.9 % gel 1 Application, Topical, Daily PRN   cetirizine (ZYRTEC) 10 mg, Every morning   Febuxostat  80 mg, Every morning   fluticasone  (FLONASE ) 50 MCG/ACT nasal spray 1 spray, Daily PRN   furosemide  (LASIX ) 40 mg, Oral, Daily   insulin  detemir (LEVEMIR ) 6 Units, Subcutaneous, Daily   Insulin  Syringe-Needle U-100 (INSULIN  SYRINGE .5CC/28G) 28G X 1/2" 0.5 ML MISC 1 Application, Does not apply, Daily   liver oil-zinc  oxide (DESITIN) 40 % ointment Topical, Every 6  hours PRN   Metoprolol  Succinate (KAPSPARGO  SPRINKLE) 50 MG CS24 1 capsule, Oral, Every morning   metoprolol  succinate (TOPROL -XL) 50 mg, Every morning   Multiple Vitamin (MULTIVITAMIN) capsule 1 capsule, Every morning   mupirocin  ointment (BACTROBAN ) 2 % Topical, Every other day   omeprazole (PRILOSEC) 20 mg, Every morning   oxyCODONE  (OXY  IR/ROXICODONE ) 5 mg, Oral, Every 6 hours PRN   oxymetazoline (AFRIN) 0.05 % nasal spray 1 spray, 2 times daily PRN   pravastatin  (PRAVACHOL ) 20 mg, Daily at bedtime   vitamin B-12 (CYANOCOBALAMIN) 500 mcg, Oral, Every morning    Diet Orders (From admission, onward)     Start     Ordered   10/02/23 0839  Diet Carb Modified Fluid consistency: Thin; Room service appropriate? Yes  Diet effective now       Question Answer Comment  Diet-HS Snack? Nothing   Calorie Level Medium 1600-2000   Fluid consistency: Thin   Room service appropriate? Yes      10/02/23 0839            DVT prophylaxis: heparin injection 5,000 Units Start: 10/01/23 2200   Lab Results  Component Value Date   PLT 200 10/02/2023      Code Status: Full Code  Family Communication: no family at bedside   Status is: Inpatient Remains inpatient appropriate because: severity of illness  Level of care: Med-Surg  Consultants:  Podiatry  Objective: Vitals:   10/01/23 2044 10/01/23 2045 10/02/23 0403 10/02/23 0741  BP:   (!) 147/59 (!) 153/62  Pulse:   72 80  Resp:   16 18  Temp:   98 F (36.7 C) 98 F (36.7 C)  TempSrc: Oral     SpO2:   100% 100%  Weight: 55.7 kg     Height:  5' 2.01" (1.575 m)      Intake/Output Summary (Last 24 hours) at 10/02/2023 1610 Last data filed at 10/02/2023 0500 Gross per 24 hour  Intake 624.58 ml  Output --  Net 624.58 ml   Wt Readings from Last 3 Encounters:  10/01/23 55.7 kg  08/14/23 56.5 kg  08/04/23 56.5 kg    Examination:  Constitutional: NAD Eyes: no scleral icterus ENMT: Mucous membranes are moist.  Neck: normal, supple Respiratory: clear to auscultation bilaterally, no wheezing, no crackles.  Cardiovascular: Regular rate and rhythm, no murmurs / rubs / gallops.  Abdomen: non distended, no tenderness. Bowel sounds positive.  Musculoskeletal: no clubbing / cyanosis.    Data Reviewed: I have independently reviewed following labs and imaging studies    CBC Recent Labs  Lab 10/01/23 1530 10/02/23 0615  WBC 15.5* 15.8*  HGB 15.1* 12.4  HCT 46.0 38.5  PLT 205 200  MCV 84.9 85.6  MCH 27.9 27.6  MCHC 32.8 32.2  RDW 15.8* 15.8*  LYMPHSABS 1.3  --   MONOABS 1.1*  --   EOSABS 0.0  --   BASOSABS 0.1  --     Recent Labs  Lab 10/01/23 1530 10/01/23 1609 10/01/23 1758  NA 126*  --   --   K 4.4  --   --   CL 86*  --   --   CO2 23  --   --   GLUCOSE 553*  --   --   BUN 23  --   --   CREATININE 1.85*  --   --   CALCIUM  8.9  --   --   AST 28  --   --  ALT 21  --   --   ALKPHOS 109  --   --   BILITOT 0.9  --   --   ALBUMIN 3.0*  --   --   LATICACIDVEN  --  3.0* 1.9  INR 1.2  --   --     ------------------------------------------------------------------------------------------------------------------ No results for input(s): "CHOL", "HDL", "LDLCALC", "TRIG", "CHOLHDL", "LDLDIRECT" in the last 72 hours.  Lab Results  Component Value Date   HGBA1C 9.2 (H) 06/13/2023   ------------------------------------------------------------------------------------------------------------------ No results for input(s): "TSH", "T4TOTAL", "T3FREE", "THYROIDAB" in the last 72 hours.  Invalid input(s): "FREET3"  Cardiac Enzymes No results for input(s): "CKMB", "TROPONINI", "MYOGLOBIN" in the last 168 hours.  Invalid input(s): "CK" ------------------------------------------------------------------------------------------------------------------ No results found for: "BNP"  CBG: Recent Labs  Lab 10/01/23 2045 10/01/23 2232 10/02/23 0743  GLUCAP 381* 363* 350*    No results found for this or any previous visit (from the past 240 hours).   Radiology Studies: MRI Left foot without contrast Result Date: 10/01/2023 CLINICAL DATA:  Soft tissue infection suspected, foot, xray done EXAM: MRI OF THE LEFT FOOT WITHOUT CONTRAST TECHNIQUE: Multiplanar, multisequence MR imaging of the left foot was performed. No intravenous contrast was  administered. COMPARISON:  Same day radiographs of the left foot at 4:18 p.m. FINDINGS: Evaluation is significantly compromised due to motion degradation. Bones/Joint/Cartilage No definite marrow signal abnormality identified to suggest acute osteomyelitis, although evaluation is significantly compromised due to motion degradation. No evidence of acute fracture. No appreciable joint effusion. Ligaments Lisfranc ligament appears intact. Muscles and Tendons Diffuse atrophy of the intrinsic musculature of the foot, which may reflect chronic denervation changes. No evidence of significant tenosynovitis. Soft tissue Suspected cutaneous irregularity in the first webspace, between the bases of the first and second proximal phalanges, which may represent a wound with surrounding edema. No obvious loculated fluid collection identified. Diffuse subcutaneous edema throughout the midfoot and forefoot. IMPRESSION: Evaluation is significantly compromised due to motion degradation. 1. No definite marrow signal abnormality identified to suggest acute osteomyelitis, although evaluation is significantly compromised due to motion degradation. 2. Suspected cutaneous irregularity in the first webspace, between the bases of the first and second proximal phalanges, which may represent a wound with surrounding edema. No obvious loculated fluid collection identified. Diffuse subcutaneous edema throughout the midfoot and forefoot. Cellulitis can not be excluded. Electronically Signed   By: Mannie Seek M.D.   On: 10/01/2023 19:00   DG Foot 2 Views Left Result Date: 10/01/2023 CLINICAL DATA:  Wound of the left foot. EXAM: LEFT FOOT - 2 VIEW COMPARISON:  Radiograph dated 08/04/2023. FINDINGS: There is no acute fracture or dislocation. The bones are osteopenic. There is soft tissue swelling of the lateral foot. No radiopaque foreign object or soft tissue gas. IMPRESSION: 1. No acute fracture or dislocation. 2. Soft tissue swelling of the  lateral foot. Electronically Signed   By: Angus Bark M.D.   On: 10/01/2023 16:52     Kathlen Para, MD, PhD [facility]  Between 7 am - 7 pm I am available, please contact me via Amion (for emergencies) or Securechat (non urgent messages)  Between 7 pm - 7 am I am not available, please contact night coverage MD/APP via Amion

## 2023-10-02 NOTE — Evaluation (Signed)
 Physical Therapy Evaluation Patient Details Name: Joy Tapia MRN: [id number] DOB: [birth year] Today's Date: 10/02/2023  History of Present Illness  83 y.o. female who presented to the ED 10/01/23 from ALF for evaluation of worsening left foot infection. Podiatry consult 5/8 with plans for I&D 5/9. PMH significant for T2DM, CKD stage IIIb, HTN, HLD, gout, osteomyelitis of right foot s/p TMA, mild dementia  Clinical Impression   Pt admitted secondary to problem above with deficits below. PTA patient is a resident of ALF and walked with RW (occasionally, per pt). She has bil post-op shoes that she wears.  Pt currently requires CGA for ambulation with RW and shoes with vc for safe use of RW. Anticipate patient will benefit from PT to address problems listed below. Will continue to follow acutely to maximize functional mobility, independence, and safety. Recommend HHPT on return to ALF, especially if weight-bearing status changes post-surgery.           If plan is discharge home, recommend the following: A little help with walking and/or transfers;A little help with bathing/dressing/bathroom;Direct supervision/assist for medications management;Direct supervision/assist for financial management;Supervision due to cognitive status   Can travel by private vehicle        Equipment Recommendations None recommended by PT  Recommendations for Other Services       Functional Status Assessment Patient has had a recent decline in their functional status and demonstrates the ability to make significant improvements in function in a reasonable and predictable amount of time.     Precautions / Restrictions Precautions Precautions: Fall Recall of Precautions/Restrictions: Impaired Precaution/Restrictions Comments: up in room alone; no post-op shoes Required Braces or Orthoses: Other Brace Other Brace: bil post-op shoes Restrictions Weight Bearing Restrictions Per Provider Order: No       Mobility  Bed Mobility Overal bed mobility: Needs Assistance Bed Mobility: Sit to Supine       Sit to supine: Supervision   General bed mobility comments: cues for technique    Transfers Overall transfer level: Needs assistance Equipment used: Rolling walker (2 wheels) Transfers: Sit to/from Stand Sit to Stand: Contact guard assist           General transfer comment: vc for proper hand placement and to not "park" RW off to the side as approaching bed    Ambulation/Gait Ambulation/Gait assistance: Contact guard assist Gait Distance (Feet): 100 Feet Assistive device: Rolling walker (2 wheels) Gait Pattern/deviations: Step-through pattern, Trunk flexed   Gait velocity interpretation: 1.31 - 2.62 ft/sec, indicative of limited community ambulator   General Gait Details: wearing bil post-op shoes; pt with poor foot clearance and tends to push RW too far ahead (corrects with cues, but then forgets)  Stairs            Wheelchair Mobility     Tilt Bed    Modified Rankin (Stroke Patients Only)       Balance Overall balance assessment: Needs assistance Sitting-balance support: No upper extremity supported, Feet supported Sitting balance-Leahy Scale: Good     Standing balance support: No upper extremity supported Standing balance-Leahy Scale: Fair                               Pertinent Vitals/Pain Pain Assessment Pain Assessment: No/denies pain    Home Living Family/patient expects to be discharged to:: Assisted living                 Home Equipment: Rolling  Walker (2 wheels);Cane - single point;Crutches;Shower seat;Other (comment);Hand held shower head Additional Comments: DME from 3/25 medical record    Prior Function Prior Level of Function : Needs assist  Cognitive Assist : Mobility (cognitive);ADLs (cognitive) Mobility (Cognitive): Intermittent cues         Mobility Comments: uses RW PRN ADLs Comments: reports  independent with ADLs     Extremity/Trunk Assessment   Upper Extremity Assessment Upper Extremity Assessment: Generalized weakness    Lower Extremity Assessment Lower Extremity Assessment: Generalized weakness (R transmet amputation)    Cervical / Trunk Assessment Cervical / Trunk Assessment: Kyphotic  Communication   Communication Communication: No apparent difficulties    Cognition Arousal: Alert Behavior During Therapy: Flat affect   PT - Cognitive impairments: History of cognitive impairments                       PT - Cognition Comments: oriented to self, place, situation (time NT) Following commands: Intact       Cueing Cueing Techniques: Verbal cues     General Comments General comments (skin integrity, edema, etc.): Patient up in straight chair across the room from her call bell and reports she got herself up and OOB. Educated on need for staff assist if she is wanting to walk and must have post-op shoes on. RN made aware    Exercises     Assessment/Plan    PT Assessment Patient needs continued PT services  PT Problem List Decreased strength;Decreased balance;Decreased mobility;Decreased cognition;Decreased knowledge of use of DME;Decreased safety awareness;Decreased knowledge of precautions       PT Treatment Interventions DME instruction;Gait training;Functional mobility training;Therapeutic activities;Balance training;Cognitive remediation;Patient/family education    PT Goals (Current goals can be found in the Care Plan section)  Acute Rehab PT Goals Patient Stated Goal: use knee scooter after foot surgery PT Goal Formulation: With patient Time For Goal Achievement: 10/16/23 Potential to Achieve Goals: Good    Frequency Min 3X/week     Co-evaluation               AM-PAC PT "6 Clicks" Mobility  Outcome Measure Help needed turning from your back to your side while in a flat bed without using bedrails?: None Help needed moving  from lying on your back to sitting on the side of a flat bed without using bedrails?: A Little Help needed moving to and from a bed to a chair (including a wheelchair)?: A Little Help needed standing up from a chair using your arms (e.g., wheelchair or bedside chair)?: A Little Help needed to walk in hospital room?: A Little Help needed climbing 3-5 steps with a railing? : A Little 6 Click Score: 19    End of Session Equipment Utilized During Treatment: Gait belt Activity Tolerance: Patient tolerated treatment well Patient left: in bed;with call bell/phone within reach;with bed alarm set Nurse Communication: Mobility status;Other (comment) (up in room alone) PT Visit Diagnosis: Other abnormalities of gait and mobility (R26.89);Muscle weakness (generalized) (M62.81)    Time: 4098-1191 PT Time Calculation (min) (ACUTE ONLY): 12 min   Charges:   PT Evaluation $PT Eval Low Complexity: 1 Low   PT General Charges $$ ACUTE PT VISIT: 1 Visit          Gayle Kava, PT Acute Rehabilitation Services  Office [phone]   Guilford Leep 10/02/2023, 2:23 PM

## 2023-10-02 NOTE — Plan of Care (Signed)

## 2023-10-03 ENCOUNTER — Inpatient Hospital Stay (HOSPITAL_COMMUNITY): Admitting: Registered Nurse

## 2023-10-03 ENCOUNTER — Encounter (HOSPITAL_COMMUNITY)
Admission: EM | Disposition: A | Discharge status: Skilled Nursing Facility | Payer: Self-pay | Source: Skilled Nursing Facility | Attending: Internal Medicine

## 2023-10-03 ENCOUNTER — Encounter (HOSPITAL_COMMUNITY): Payer: Self-pay | Admitting: Internal Medicine

## 2023-10-03 DIAGNOSIS — L02416 Cutaneous abscess of left lower limb: Secondary | ICD-10-CM

## 2023-10-03 DIAGNOSIS — E119 Type 2 diabetes mellitus without complications: Secondary | ICD-10-CM

## 2023-10-03 DIAGNOSIS — I10 Essential (primary) hypertension: Secondary | ICD-10-CM

## 2023-10-03 DIAGNOSIS — L03116 Cellulitis of left lower limb: Secondary | ICD-10-CM | POA: Diagnosis not present

## 2023-10-03 DIAGNOSIS — L02612 Cutaneous abscess of left foot: Secondary | ICD-10-CM | POA: Diagnosis not present

## 2023-10-03 DIAGNOSIS — Z87891 Personal history of nicotine dependence: Secondary | ICD-10-CM

## 2023-10-03 HISTORY — PX: IRRIGATION AND DEBRIDEMENT ABSCESS: SHX5252

## 2023-10-03 LAB — BASIC METABOLIC PANEL WITH GFR
Anion gap: 11 (ref 5–15)
BUN: 18 mg/dL (ref 8–23)
CO2: 24 mmol/L (ref 22–32)
Calcium: 8.2 mg/dL — ABNORMAL LOW (ref 8.9–10.3)
Chloride: 97 mmol/L — ABNORMAL LOW (ref 98–111)
Creatinine, Ser: 1.58 mg/dL — ABNORMAL HIGH (ref 0.44–1.00)
GFR, Estimated: 33 mL/min — ABNORMAL LOW (ref >60)
Glucose, Bld: 226 mg/dL — ABNORMAL HIGH (ref 70–99)
Potassium: 3.6 mmol/L (ref 3.5–5.1)
Sodium: 132 mmol/L — ABNORMAL LOW (ref 135–145)

## 2023-10-03 LAB — CBC
HCT: 33.8 % — ABNORMAL LOW (ref 36.0–46.0)
Hemoglobin: 10.9 g/dL — ABNORMAL LOW (ref 12.0–15.0)
MCH: 27.2 pg (ref 26.0–34.0)
MCHC: 32.2 g/dL (ref 30.0–36.0)
MCV: 84.3 fL (ref 80.0–100.0)
Platelets: 294 10*3/uL (ref 150–400)
RBC: 4.01 MIL/uL (ref 3.87–5.11)
RDW: 15.4 % (ref 11.5–15.5)
WBC: 16.2 10*3/uL — ABNORMAL HIGH (ref 4.0–10.5)
nRBC: 0 % (ref 0.0–0.2)

## 2023-10-03 LAB — GLUCOSE, CAPILLARY
Glucose-Capillary: 245 mg/dL — ABNORMAL HIGH (ref 70–99)
Glucose-Capillary: 240 mg/dL — ABNORMAL HIGH (ref 70–99)
Glucose-Capillary: 199 mg/dL — ABNORMAL HIGH (ref 70–99)
Glucose-Capillary: 201 mg/dL — ABNORMAL HIGH (ref 70–99)
Glucose-Capillary: 280 mg/dL — ABNORMAL HIGH (ref 70–99)
Glucose-Capillary: 200 mg/dL — ABNORMAL HIGH (ref 70–99)

## 2023-10-03 LAB — MAGNESIUM: Magnesium: 1.7 mg/dL (ref 1.7–2.4)

## 2023-10-03 SURGERY — IRRIGATION AND DEBRIDEMENT ABSCESS
Anesthesia: Monitor Anesthesia Care | Site: Foot | Laterality: Left

## 2023-10-03 MED ORDER — VANCOMYCIN HCL 1000 MG IV SOLR
INTRAVENOUS | Status: AC
  Filled 2023-10-03: qty 20

## 2023-10-03 MED ORDER — FENTANYL CITRATE (PF) 250 MCG/5ML IJ SOLN
INTRAMUSCULAR | Status: DC | PRN
Start: 2023-10-03 — End: 2023-10-03
  Administered 2023-10-03: 25 ug via INTRAVENOUS

## 2023-10-03 MED ORDER — BUPIVACAINE HCL (PF) 0.5 % IJ SOLN
INTRAMUSCULAR | Status: AC
  Filled 2023-10-03: qty 30

## 2023-10-03 MED ORDER — PROPOFOL 500 MG/50ML IV EMUL
INTRAVENOUS | Status: DC | PRN
  Administered 2023-10-03: 80 ug/kg/min via INTRAVENOUS

## 2023-10-03 MED ORDER — VANCOMYCIN HCL 1000 MG IV SOLR
INTRAVENOUS | Status: DC | PRN
  Administered 2023-10-03: 1000 mg via TOPICAL

## 2023-10-03 MED ORDER — 0.9 % SODIUM CHLORIDE (POUR BTL) OPTIME
TOPICAL | Status: DC | PRN
  Administered 2023-10-03: 1000 mL

## 2023-10-03 MED ORDER — FLUTICASONE PROPIONATE 50 MCG/ACT NA SUSP
1.0000 | Freq: Every day | NASAL | Status: DC | PRN
  Administered 2023-10-03 – 2023-10-06 (×3): 1 via NASAL
  Filled 2023-10-03: qty 16

## 2023-10-03 MED ORDER — INSULIN GLARGINE-YFGN 100 UNIT/ML ~~LOC~~ SOLN
14.0000 [IU] | Freq: Every day | SUBCUTANEOUS | Status: DC
  Administered 2023-10-03: 14 [IU] via SUBCUTANEOUS
  Filled 2023-10-03 (×3): qty 0.14

## 2023-10-03 MED ORDER — CHLORHEXIDINE GLUCONATE 0.12 % MT SOLN
15.0000 mL | Freq: Once | OROMUCOSAL | Status: AC
  Administered 2023-10-03: 15 mL via OROMUCOSAL

## 2023-10-03 MED ORDER — FENTANYL CITRATE (PF) 100 MCG/2ML IJ SOLN: 25.0000 ug | INTRAMUSCULAR | Status: DC | PRN

## 2023-10-03 MED ORDER — OXYCODONE HCL 5 MG/5ML PO SOLN: 5.0000 mg | Freq: Once | ORAL | Status: DC | PRN

## 2023-10-03 MED ORDER — INSULIN ASPART 100 UNIT/ML IJ SOLN
5.0000 [IU] | Freq: Three times a day (TID) | INTRAMUSCULAR | Status: DC
  Administered 2023-10-03 – 2023-10-04 (×3): 5 [IU] via SUBCUTANEOUS

## 2023-10-03 MED ORDER — LIDOCAINE HCL 1 % IJ SOLN
INTRAMUSCULAR | Status: DC | PRN
  Administered 2023-10-03: 10 mL

## 2023-10-03 MED ORDER — LIDOCAINE HCL 1 % IJ SOLN
INTRAMUSCULAR | Status: AC
  Filled 2023-10-03: qty 20

## 2023-10-03 MED ORDER — PROPOFOL 10 MG/ML IV BOLUS
INTRAVENOUS | Status: DC | PRN
  Administered 2023-10-03: 30 mg via INTRAVENOUS

## 2023-10-03 MED ORDER — OXYCODONE HCL 5 MG PO TABS: 5.0000 mg | ORAL_TABLET | Freq: Once | ORAL | Status: DC | PRN

## 2023-10-03 MED ORDER — ONDANSETRON HCL 4 MG/2ML IJ SOLN: 4.0000 mg | Freq: Four times a day (QID) | INTRAMUSCULAR | Status: DC | PRN

## 2023-10-03 MED ORDER — PHENYLEPHRINE 80 MCG/ML (10ML) SYRINGE FOR IV PUSH (FOR BLOOD PRESSURE SUPPORT)
PREFILLED_SYRINGE | INTRAVENOUS | Status: DC | PRN
Start: 2023-10-03 — End: 2023-10-03
  Administered 2023-10-03: 40 ug via INTRAVENOUS
  Administered 2023-10-03: 80 ug via INTRAVENOUS

## 2023-10-03 MED ORDER — SODIUM CHLORIDE 0.9 % IR SOLN
Status: DC | PRN
  Administered 2023-10-03: 3000 mL

## 2023-10-03 MED ORDER — METOPROLOL SUCCINATE ER 25 MG PO TB24
ORAL_TABLET | ORAL | Status: AC
  Administered 2023-10-03: 50 mg via ORAL
  Filled 2023-10-03: qty 2

## 2023-10-03 MED ORDER — FENTANYL CITRATE (PF) 250 MCG/5ML IJ SOLN
INTRAMUSCULAR | Status: AC
  Filled 2023-10-03: qty 5

## 2023-10-03 MED ORDER — LACTATED RINGERS IV SOLN: INTRAVENOUS | Status: DC

## 2023-10-03 MED ORDER — ORAL CARE MOUTH RINSE: 15.0000 mL | Freq: Once | OROMUCOSAL | Status: AC

## 2023-10-03 SURGICAL SUPPLY — 49 items
BLADE SURG 15 STRL LF DISP TIS (BLADE) ×1 IMPLANT
BNDG ELASTIC 3INX 5YD STR LF (GAUZE/BANDAGES/DRESSINGS) ×1 IMPLANT
BNDG ELASTIC 4INX 5YD STR LF (GAUZE/BANDAGES/DRESSINGS) ×1 IMPLANT
BNDG ESMARK 4X9 LF (GAUZE/BANDAGES/DRESSINGS) ×1 IMPLANT
BNDG GAUZE DERMACEA FLUFF 4 (GAUZE/BANDAGES/DRESSINGS) ×1 IMPLANT
CHLORAPREP W/TINT 26 (MISCELLANEOUS) ×1 IMPLANT
CNTNR URN SCR LID CUP LEK RST (MISCELLANEOUS) ×1 IMPLANT
COVER BACK TABLE 60X90IN (DRAPES) ×1 IMPLANT
CUFF TOURN SGL QUICK 18X4 (TOURNIQUET CUFF) ×1 IMPLANT
CUFF TRNQT CYL 24X4X16.5-23 (TOURNIQUET CUFF) ×1 IMPLANT
DRAPE 3/4 80X56 (DRAPES) ×1 IMPLANT
DRAPE EXTREMITY T 121X128X90 (DISPOSABLE) ×1 IMPLANT
DRAPE SHEET LG 3/4 BI-LAMINATE (DRAPES) ×1 IMPLANT
DRAPE U-SHAPE 47X51 STRL (DRAPES) ×1 IMPLANT
DRSG XEROFORM 1X8 (GAUZE/BANDAGES/DRESSINGS) IMPLANT
ELECT REM PT RETURN 15FT ADLT (MISCELLANEOUS) ×1 IMPLANT
GAUZE PACKING IODOFORM 1/4X15 (PACKING) IMPLANT
GAUZE PAD ABD 8X10 STRL (GAUZE/BANDAGES/DRESSINGS) IMPLANT
GAUZE SPONGE 4X4 12PLY STRL (GAUZE/BANDAGES/DRESSINGS) ×1 IMPLANT
GAUZE XEROFORM 1X8 LF (GAUZE/BANDAGES/DRESSINGS) ×1 IMPLANT
GLOVE BIO SURGEON STRL SZ7.5 (GLOVE) ×1 IMPLANT
GLOVE BIOGEL PI IND STRL 7.5 (GLOVE) ×1 IMPLANT
GOWN STRL REUS W/ TWL XL LVL3 (GOWN DISPOSABLE) ×1 IMPLANT
KIT BASIN OR (CUSTOM PROCEDURE TRAY) ×1 IMPLANT
KIT TURNOVER KIT B (KITS) IMPLANT
MANIFOLD NEPTUNE II (INSTRUMENTS) ×1 IMPLANT
NDL HYPO 25X1 1.5 SAFETY (NEEDLE) ×1 IMPLANT
NEEDLE HYPO 25X1 1.5 SAFETY (NEEDLE) ×1 IMPLANT
NS IRRIG 1000ML POUR BTL (IV SOLUTION) ×1 IMPLANT
PACK ORTHO EXTREMITY (CUSTOM PROCEDURE TRAY) IMPLANT
PADDING CAST ABS COTTON 4X4 ST (CAST SUPPLIES) ×1 IMPLANT
PENCIL SMOKE EVACUATOR (MISCELLANEOUS) IMPLANT
SET HNDPC FAN SPRY TIP SCT (DISPOSABLE) IMPLANT
SET IRRIG Y TYPE TUR BLADDER L (SET/KITS/TRAYS/PACK) ×1 IMPLANT
SPONGE T-LAP 4X18 ~~LOC~~+RFID (SPONGE) ×1 IMPLANT
STAPLER SKIN PROX WIDE 3.9 (STAPLE) ×1 IMPLANT
STOCKINETTE 6 STRL (DRAPES) ×1 IMPLANT
SUCTION TUBE FRAZIER 10FR DISP (SUCTIONS) ×1 IMPLANT
SUT ETHILON 3 0 PS 1 (SUTURE) ×1 IMPLANT
SUT ETHILON 4 0 PS 2 18 (SUTURE) ×1 IMPLANT
SUT MNCRL AB 3-0 PS2 18 (SUTURE) ×1 IMPLANT
SUT MNCRL AB 4-0 PS2 18 (SUTURE) ×1 IMPLANT
SUT VIC AB 2-0 SH 27XBRD (SUTURE) ×1 IMPLANT
SYR BULB EAR ULCER 3OZ GRN STR (SYRINGE) ×1 IMPLANT
SYR CONTROL 10ML LL (SYRINGE) ×1 IMPLANT
TUBE CONNECTING 12X1/4 (SUCTIONS) ×1 IMPLANT
TUBE IRRIGATION SET MISONIX (TUBING) ×1 IMPLANT
UNDERPAD 30X36 HEAVY ABSORB (UNDERPADS AND DIAPERS) ×1 IMPLANT
YANKAUER SUCT BULB TIP NO VENT (SUCTIONS) ×1 IMPLANT

## 2023-10-03 NOTE — Plan of Care (Signed)
 Pt alert and oriented x3. Dangles at bedside at intervals. Tolerating po intake. Blood glucose monitored. assistance provided to the bathroom. Drsgs intact to bilateral feet. Medicated with tylenol  for c/o discomfort in her feet. Encouraged to call for assistance out of the bed. Call light in reach. Sr x3 elevated. Bed in low position. Bed alarm activated.

## 2023-10-03 NOTE — Progress Notes (Signed)
 Orthopedic Tech Progress Note Patient Details:  Joy Tapia 1940-05-31 [id number]  Patient ID: Joy Tapia, female   DOB: 05-Mar-1941, 83 y.o.   MRN: [id number] Order received for post op shoe,  Pt already with 2 post op shoes from home in good working order.    Kaeo Jacome OTR/L 10/03/2023, 12:11 PM

## 2023-10-03 NOTE — Progress Notes (Signed)
 Physical Therapy Treatment Patient Details Name: Joy Tapia MRN: [id number] DOB: [birth year] Today's Date: 10/03/2023   History of Present Illness 83 y.o. female who presented to the ED 10/01/23 from ALF for evaluation of worsening left foot infection. Podiatry consult 5/8 with plans for I&D 5/9. PMH significant for T2DM, CKD stage IIIb, HTN, HLD, gout, osteomyelitis of right foot s/p TMA, mild dementia    PT Comments  Pt seen for PT tx with pt received in bathroom, friend present in room reporting she helped pt to bathroom. Pt without BLE post op shoes donned, L foot noted to be bleeding & pt unaware. PT educates pt & friend on need for pt to wear post op shoes for all mobility; provided assistance for donning shoe. Pt is able to complete sit>stand from toilet with supervision with cuing to use grab bar. Pt ambulates in hallway with RW & CGA with cuing for upright posture & to ambulate within base of AD. Pt assisted back to bed & LLE elevated 2/2 ongoing bleeding - nurse made aware.     If plan is discharge home, recommend the following: A little help with walking and/or transfers;A little help with bathing/dressing/bathroom;Direct supervision/assist for medications management;Direct supervision/assist for financial management;Supervision due to cognitive status   Can travel by private vehicle        Equipment Recommendations  None recommended by PT    Recommendations for Other Services       Precautions / Restrictions Precautions Precautions: Fall Other Brace: bil post-op shoes Restrictions Weight Bearing Restrictions Per Provider Order: No     Mobility  Bed Mobility Overal bed mobility: Modified Independent Bed Mobility: Sit to Supine       Sit to supine: Modified independent (Device/Increase time), HOB elevated        Transfers Overall transfer level: Needs assistance Equipment used: Rolling walker (2 wheels) Transfers: Sit to/from Stand Sit to Stand:  Supervision           General transfer comment: sit>stand from toilet, cuing to use grab bar, 2 attempts for successful sit>stand    Ambulation/Gait Ambulation/Gait assistance: Contact guard assist Gait Distance (Feet): 200 Feet Assistive device: Rolling walker (2 wheels) Gait Pattern/deviations: Decreased step length - left, Decreased step length - right, Decreased stride length, Step-through pattern       General Gait Details: education re: need to ambulate within base of AD, upright posture   Stairs             Wheelchair Mobility     Tilt Bed    Modified Rankin (Stroke Patients Only)       Balance Overall balance assessment: Needs assistance Sitting-balance support: No upper extremity supported, Feet supported Sitting balance-Leahy Scale: Good     Standing balance support: Bilateral upper extremity supported, During functional activity, Reliant on assistive device for balance Standing balance-Leahy Scale: Fair                              Hotel manager: No apparent difficulties  Cognition Arousal: Alert Behavior During Therapy: Flat affect   PT - Cognitive impairments: History of cognitive impairments, Awareness                       PT - Cognition Comments: up in bathroom, not aware of L foot bleeding Following commands: Intact      Cueing Cueing Techniques: Verbal cues  Exercises  General Comments General comments (skin integrity, edema, etc.): Pt received on toilet, performs peri hygiene without assistance. PT provides assistance for donning BLE post op shoes.      Pertinent Vitals/Pain Pain Assessment Pain Assessment: No/denies pain    Home Living                          Prior Function            PT Goals (current goals can now be found in the care plan section) Acute Rehab PT Goals Patient Stated Goal: use knee scooter after foot surgery PT Goal Formulation: With  patient Time For Goal Achievement: 10/16/23 Potential to Achieve Goals: Good Progress towards PT goals: Progressing toward goals    Frequency    Min 3X/week      PT Plan      Co-evaluation              AM-PAC PT "6 Clicks" Mobility   Outcome Measure  Help needed turning from your back to your side while in a flat bed without using bedrails?: None Help needed moving from lying on your back to sitting on the side of a flat bed without using bedrails?: None Help needed moving to and from a bed to a chair (including a wheelchair)?: A Little Help needed standing up from a chair using your arms (e.g., wheelchair or bedside chair)?: A Little Help needed to walk in hospital room?: A Little Help needed climbing 3-5 steps with a railing? : A Little 6 Click Score: 20    End of Session Equipment Utilized During Treatment:  (BLE post op shoes) Activity Tolerance: Patient tolerated treatment well Patient left: in bed;with call bell/phone within reach;with bed alarm set Nurse Communication:  (L foot bleeding) PT Visit Diagnosis: Other abnormalities of gait and mobility (R26.89);Muscle weakness (generalized) (M62.81)     Time: 5784-6962 PT Time Calculation (min) (ACUTE ONLY): 12 min  Charges:    $Therapeutic Activity: 8-22 mins PT General Charges $$ ACUTE PT VISIT: 1 Visit                     Emaline Handsome, PT, DPT 10/03/23, 2:48 PM   Venetta Gill 10/03/2023, 2:47 PM

## 2023-10-03 NOTE — Progress Notes (Signed)
 PROGRESS NOTE  Joy Tapia:[id number] DOB: Jan 15, 1941 DOA: 10/01/2023 PCP: [facility]   LOS: 2 days   Brief Narrative / Interim history: 83 year old female with history of DM 2, CKD 3B, HTN, HLD, osteomyelitis of the right foot status post TMA, mild dementia who comes into the hospital from ALF for worsening left foot infection.  She resides at [facility], and has been having left foot wound that has not been healing.  She was found to have a white count of 15.5 in the ER, and an MRI did not show any acute osteomyelitis but it was not a good exam and it was motion degraded.  Podiatry was consulted, she was admitted to the hospital and given antibiotics  Subjective / 24h Interval events: No pain, no fever or chills, no shortness of breath.  No nausea or vomiting.  Assesement and Plan: Principal Problem:   Cellulitis of left foot Active Problems:   Type 2 diabetes mellitus with hyperglycemia, with long-term current use of insulin  (HCC)   Acute kidney injury superimposed on chronic kidney disease (HCC)   Hypertension associated with diabetes (HCC)   Hyperlipidemia associated with type 2 diabetes mellitus (HCC)   Abscess of left foot  Principal problem Diabetic wound/cellulitis of the left foot -worsening wound with associated cellulitis and leukocytosis.  MRI limited due to motion degradation but no acute osteomyelitis seen.  Patient was started on antibiotics - ABIs pending, but January 2025 ABIs were fairly unremarkable. - Podiatry consulted, she will be taken to the OR for debridement today  Active problems Acute kidney injury on CKD stage IIIb-creatinine 1.8 on admission, baseline 1.4-1.5.  Has been placed on fluids, repeat creatinine this morning at baseline at 1.5.  Continue to monitor  Essential hypertension-continue Toprol   Hyperlipidemia-continue statin  Mild dementia-delirium precautions  History of gout-holding febuxostat   DM2,  insulin -dependent, poorly controlled, with hyperglycemia -patient severely hyperglycemic on admission.  She tells me she is not checking her sugars regularly at home.  Increase insulin  dosing again today  Lab Results  Component Value Date   HGBA1C 9.2 (H) 06/13/2023   CBG (last 3)  Recent Labs    10/02/23 2213 10/03/23 0742 10/03/23 0950  GLUCAP 410* 245* 240*    Scheduled Meds:  feeding supplement (GLUCERNA SHAKE)  237 mL Oral BID BM   heparin   5,000 Units Subcutaneous Q8H   insulin  aspart  0-5 Units Subcutaneous QHS   insulin  aspart  0-9 Units Subcutaneous TID WC   insulin  aspart  3 Units Subcutaneous TID WC   insulin  glargine-yfgn  10 Units Subcutaneous QHS   metoprolol  succinate  50 mg Oral q morning   multivitamin  1 tablet Oral QHS   pantoprazole   40 mg Oral Daily   pravastatin   20 mg Oral QHS   Continuous Infusions:  ampicillin -sulbactam (UNASYN ) IV 3 g (10/03/23 0802)   vancomycin  750 mg (10/01/23 2223)   PRN Meds:.acetaminophen  **OR** acetaminophen , fluticasone , ondansetron  **OR** ondansetron  (ZOFRAN ) IV, senna-docusate  Current Outpatient Medications  Medication Instructions   acetaminophen  (TYLENOL ) 650 mg, Every 8 hours PRN   albuterol  (VENTOLIN  HFA) 108 (90 Base) MCG/ACT inhaler 2 puffs, Every 4 hours PRN   aspirin  EC 81 mg, Every morning   cadexomer iodine  (IODOSORB) 0.9 % gel 1 Application, Topical, Daily PRN   cetirizine (ZYRTEC) 10 mg, Every morning   Febuxostat  80 mg, Every morning   fluticasone  (FLONASE ) 50 MCG/ACT nasal spray 1 spray, Daily PRN   furosemide  (LASIX ) 40 mg, Oral, Daily  insulin  detemir (LEVEMIR ) 6 Units, Subcutaneous, Daily   Insulin  Syringe-Needle U-100 (INSULIN  SYRINGE .5CC/28G) 28G X 1/2" 0.5 ML MISC 1 Application, Does not apply, Daily   liver oil-zinc  oxide (DESITIN) 40 % ointment Topical, Every 6 hours PRN   Metoprolol  Succinate (KAPSPARGO  SPRINKLE) 50 MG CS24 1 capsule, Oral, Every morning   metoprolol  succinate (TOPROL -XL) 50  mg, Every morning   Multiple Vitamin (MULTIVITAMIN) capsule 1 capsule, Every morning   mupirocin  ointment (BACTROBAN ) 2 % Topical, Every other day   omeprazole (PRILOSEC) 20 mg, Every morning   oxyCODONE  (OXY IR/ROXICODONE ) 5 mg, Oral, Every 6 hours PRN   oxymetazoline (AFRIN) 0.05 % nasal spray 1 spray, 2 times daily PRN   pravastatin  (PRAVACHOL ) 20 mg, Daily at bedtime   vitamin B-12 (CYANOCOBALAMIN) 500 mcg, Oral, Every morning    Diet Orders (From admission, onward)     Start     Ordered   10/03/23 0001  Diet NPO time specified Except for: Sips with Meds  Diet effective midnight       Question:  Except for  Answer:  Sips with Meds   10/02/23 1105            DVT prophylaxis: heparin  injection 5,000 Units Start: 10/01/23 2200   Lab Results  Component Value Date   PLT 294 10/03/2023      Code Status: Full Code  Family Communication: no family at bedside   Status is: Inpatient Remains inpatient appropriate because: severity of illness  Level of care: Med-Surg  Consultants:  Podiatry  Objective: Vitals:   10/02/23 1719 10/02/23 1951 10/03/23 0341 10/03/23 0744  BP: (!) 155/54 128/70 95/74 90/63   Pulse: 86 89 82 96  Resp:  16 16 16   Temp: 98 F (36.7 C) 99.1 F (37.3 C) 97.8 F (36.6 C) 98.6 F (37 C)  TempSrc:    Oral  SpO2: 98% 100% 100% 100%  Weight:   55.7 kg   Height:        Intake/Output Summary (Last 24 hours) at 10/03/2023 0955 Last data filed at 10/02/2023 1832 Gross per 24 hour  Intake 100 ml  Output --  Net 100 ml   Wt Readings from Last 3 Encounters:  10/03/23 55.7 kg  08/14/23 56.5 kg  08/04/23 56.5 kg    Examination:  Constitutional: NAD Eyes: lids and conjunctivae normal, no scleral icterus ENMT: mmm Neck: normal, supple Respiratory: clear to auscultation bilaterally, no wheezing, no crackles.  Cardiovascular: Regular rate and rhythm, no murmurs / rubs / gallops.  Abdomen: soft, no distention, no tenderness. Bowel sounds  positive.    Data Reviewed: I have independently reviewed following labs and imaging studies   CBC Recent Labs  Lab 10/01/23 1530 10/02/23 0615 10/03/23 0100  WBC 15.5* 15.8* 16.2*  HGB 15.1* 12.4 10.9*  HCT 46.0 38.5 33.8*  PLT 205 200 294  MCV 84.9 85.6 84.3  MCH 27.9 27.6 27.2  MCHC 32.8 32.2 32.2  RDW 15.8* 15.8* 15.4  LYMPHSABS 1.3  --   --   MONOABS 1.1*  --   --   EOSABS 0.0  --   --   BASOSABS 0.1  --   --     Recent Labs  Lab 10/01/23 1530 10/01/23 1609 10/01/23 1758 10/03/23 0100  NA 126*  --   --  132*  K 4.4  --   --  3.6  CL 86*  --   --  97*  CO2 23  --   --  24  GLUCOSE 553*  --   --  226*  BUN 23  --   --  18  CREATININE 1.85*  --   --  1.58*  CALCIUM  8.9  --   --  8.2*  AST 28  --   --   --   ALT 21  --   --   --   ALKPHOS 109  --   --   --   BILITOT 0.9  --   --   --   ALBUMIN 3.0*  --   --   --   MG  --   --   --  1.7  LATICACIDVEN  --  3.0* 1.9  --   INR 1.2  --   --   --     ------------------------------------------------------------------------------------------------------------------ No results for input(s): "CHOL", "HDL", "LDLCALC", "TRIG", "CHOLHDL", "LDLDIRECT" in the last 72 hours.  Lab Results  Component Value Date   HGBA1C 9.2 (H) 06/13/2023   ------------------------------------------------------------------------------------------------------------------ No results for input(s): "TSH", "T4TOTAL", "T3FREE", "THYROIDAB" in the last 72 hours.  Invalid input(s): "FREET3"  Cardiac Enzymes No results for input(s): "CKMB", "TROPONINI", "MYOGLOBIN" in the last 168 hours.  Invalid input(s): "CK" ------------------------------------------------------------------------------------------------------------------ No results found for: "BNP"  CBG: Recent Labs  Lab 10/02/23 1720 10/02/23 2107 10/02/23 2213 10/03/23 0742 10/03/23 0950  GLUCAP 303* 424* 410* 245* 240*    Recent Results (from the past 240 hours)  Blood  Culture (routine x 2)     Status: None (Preliminary result)   Collection Time: 10/01/23  3:20 PM   Specimen: BLOOD  Result Value Ref Range Status   Specimen Description BLOOD RIGHT ANTECUBITAL  Final   Special Requests   Final    BOTTLES DRAWN AEROBIC AND ANAEROBIC Blood Culture results may not be optimal due to an inadequate volume of blood received in culture bottles   Culture   Final    NO GROWTH 2 DAYS Performed at [facility], [address], [place], [place] [address]    Report Status PENDING  Incomplete  Blood Culture (routine x 2)     Status: None (Preliminary result)   Collection Time: 10/01/23  9:10 PM   Specimen: BLOOD LEFT HAND  Result Value Ref Range Status   Specimen Description BLOOD LEFT HAND  Final   Special Requests   Final    BOTTLES DRAWN AEROBIC AND ANAEROBIC Blood Culture results may not be optimal due to an inadequate volume of blood received in culture bottles   Culture   Final    NO GROWTH 2 DAYS Performed at [facility], [address], [place], [place] [address]    Report Status PENDING  Incomplete     Radiology Studies: No results found.    Kathlen Para, MD, PhD [facility]  Between 7 am - 7 pm I am available, please contact me via Amion (for emergencies) or Securechat (non urgent messages)  Between 7 pm - 7 am I am not available, please contact night coverage MD/APP via Amion

## 2023-10-03 NOTE — Plan of Care (Signed)

## 2023-10-03 NOTE — Op Note (Signed)
 Full Operative Report  Date of Operation: 11:34 AM, 10/03/2023   Patient: Joy Tapia - 83 y.o. female  Surgeon: Evertt Hoe, DPM   Assistant: None  Diagnosis: Abscess left foot, first webspace  Procedure:  1.  Incision and drainage of abscess and debridement of ulceration first webspace, left foot 2.  Bone biopsy open superficial proximal phalanx second toe left foot    Anesthesia: Anesthesia type not filed in the log.  Ellena Gurney, MD  Anesthesiologist: Ellena Gurney, MD CRNA: Merna Aase, CRNA   Estimated Blood Loss: Minimal   Hemostasis: 1) Anatomical dissection, mechanical compression, electrocautery 2) no tourniquet was used during the procedure  Implants: * No implants in log *  Materials: Iodoform quarter-inch packing  Injectables: 1) Pre-operatively: 10 cc of 50:50 mixture 1%lidocaine  plain and 0.5% marcaine  plain 2) Post-operatively: None   Specimens: - Pathology: Bone biopsy proximal phalanx medial aspect base, second toe left foot - Microbiology: Abscess fluid culture, deep tissue culture first webspace   Antibiotics: IV antibiotics given per schedule on the floor  Drains: None  Complications: Patient tolerated the procedure well without complication.   Operative findings: As below in detailed report  Indications for Procedure: Joy Tapia presents to Evertt Hoe, DPM with a chief complaint of ulceration in the first webspace in between the 1st and 2nd toe with concern for abscess and possible underlying osteomyelitis.  The patient has failed conservative treatments of various modalities. At this time the patient has elected to proceed with surgical correction. All alternatives, risks, and complications of the procedures were thoroughly explained to the patient. Patient exhibits appropriate understanding of all discussion points and informed consent was signed and obtained in the chart with no guarantees to  surgical outcome given or implied.  Description of Procedure: Patient was brought to the operating room. Patient remained on their hospital bed in the supine position. A surgical timeout was performed and all members of the operating room, the procedure, and the surgical site were identified. anesthesia occurred as per anesthesia record. Local anesthetic as previously described was then injected about the operative field in a local infiltrative block.  The operative lower extremity as noted above was then prepped and draped in the usual sterile manner. The following procedure then began.  Attention was directed to the ulceration present in the first webspace of the left foot.  Rongeur was used to debride the ulceration with excisional debridement of any necrotic tissues present.  The wound did track deep towards the first webspace as well as the base of the proximal phalanx.  There was significant application of the medial aspect of the proximal phalangeal base and second MPJ.  There was significant purulent fluid that was expressed from the ulceration.  Deep tissue culture was obtained with rongeur and passed the back table sent for micro.  A second culture was obtained with rongeur that was mostly abscess fluid.  This was sent separately for micro.  Manual compression of the foot was used to express any further purulence from the dorsal aspect of the midfoot and first webspace and interspace area.  Further excisional debridement is carried out with rongeur to the level of bone.  The wound was completely debrided of any necrotic tissues until no further purulence was seen.   Next using a clean rongeur a bone biopsy was harvested from the medial aspect of the proximal phalangeal base.  This fragment of bone was harvested through the open surgical site.  This was  passive active was sent for pathology.  The surgical site was then irrigated with 3 L of sterile saline via power press lavage.  Vancomycin  powder  1/2 g was then implanted within the surgical cavity.  The surgical cavity was then packed with quarter inch iodoform packing strip.  The surgical site was then dressed with Xeroform 4 x 4 Kerlix Ace. The patient tolerated both the procedure and anesthesia well with vital signs stable throughout. The patient was transferred in good condition and all vital signs stable  from the OR to recovery under the discretion of anesthesia.  Condition: Vital signs stable, neurovascular status unchanged from preoperative   Surgical plan:  Follow-up cultures and pathology.  Patient underwent second toe amputation this admission given wound tracking towards the base of the proximal phalanx.  If no osteomyelitis and plan for wound healing by secondary intention will need frequent packing dressing changes.  Weightbearing as tolerated to left foot in postop shoe  The patient will be weightbearing as tolerated in a postop shoe to the operative limb until further instructed. The dressing is to remain clean, dry, and intact. Will continue to follow unless noted elsewhere.   Russ Course, DPM [facility]

## 2023-10-03 NOTE — Transfer of Care (Signed)
 Immediate Anesthesia Transfer of Care Note  Patient: Joy Tapia  Procedure(s) Performed: IRRIGATION AND DEBRIDEMENT ABSCESS (Left: Foot)  Patient Location: PACU  Anesthesia Type:MAC  Level of Consciousness: awake  Airway & Oxygen Therapy: Patient Spontanous Breathing  Post-op Assessment: Report given to RN and Post -op Vital signs reviewed and stable  Post vital signs: Reviewed and stable  Last Vitals:  Vitals Value Taken Time  BP 116/50 10/03/23 1139  Temp    Pulse 73 10/03/23 1141  Resp 24 10/03/23 1141  SpO2 99 % 10/03/23 1141  Vitals shown include unfiled device data.  Last Pain:  Vitals:   10/03/23 1039  TempSrc:   PainSc: 2          Complications: No notable events documented.

## 2023-10-03 NOTE — Progress Notes (Signed)
 PT Cancellation Note  Patient Details Name: Joy Tapia MRN: [id number] DOB: 05-May-1941   Cancelled Treatment:    Reason Eval/Treat Not Completed: Patient at procedure or test/unavailable Pt noted to be off the floor at this time. Will f/u as able.  Emaline Handsome, PT, DPT 10/03/23, 11:28 AM  Venetta Gill 10/03/2023, 11:28 AM

## 2023-10-03 NOTE — TOC Initial Note (Signed)
 Transition of Care [facility]) - Initial/Assessment Note    Patient Details  Name: Joy Tapia MRN: [id number] Date of Birth: 03/23/1941  [facility]) CM/SW Contact:    Arron Big, LCSWA Phone Number: 10/03/2023, 1:34 PM  Clinical Narrative:   Patient is from [facility], CSW called [facility] and confirmed. CSW left VM for admissions to discuss patients disposition.   TOC will continue to follow.              Expected Discharge Plan: Assisted Living ([facility]) Barriers to Discharge: Continued Medical Work up   Patient Goals and CMS Choice Patient states their goals for this hospitalization and ongoing recovery are:: Unable to assess          Expected Discharge Plan and Services In-house Referral: Clinical Social Work     Living arrangements for the past 2 months: Assisted Living Facility                                      Prior Living Arrangements/Services Living arrangements for the past 2 months: Assisted Living Facility Lives with:: Facility Resident Patient language and need for interpreter reviewed:: No Do you feel safe going back to the place where you live?: Yes      Need for Family Participation in Patient Care: Yes (Comment) Care giver support system in place?: Yes (comment)   Criminal Activity/Legal Involvement Pertinent to Current Situation/Hospitalization: No - Comment as needed  Activities of Daily Living   ADL Screening (condition at time of admission) Independently performs ADLs?: No Does the patient have a NEW difficulty with bathing/dressing/toileting/self-feeding that is expected to last >3 days?: No Does the patient have a NEW difficulty with getting in/out of bed, walking, or climbing stairs that is expected to last >3 days?: No Does the patient have a NEW difficulty with communication that is expected to last >3 days?: No Is the patient deaf or have difficulty hearing?: No Does the patient  have difficulty seeing, even when wearing glasses/contacts?: No Does the patient have difficulty concentrating, remembering, or making decisions?: Yes  Permission Sought/Granted Permission sought to share information with : Magazine features editor, Other (comment) (contact info in chart, pt is from ALF) Permission granted to share information with : No  Share Information with NAME: [facility])  Permission granted to share info w AGENCY: [facility]  Permission granted to share info w Relationship: Friend  Permission granted to share info w Contact Information: [phone]  Emotional Assessment Appearance:: Appears stated age Attitude/Demeanor/Rapport: Unable to Assess Affect (typically observed): Unable to Assess Orientation: : Oriented to Self, Oriented to Place, Oriented to  Time, Oriented to Situation Alcohol / Substance Use: Not Applicable Psych Involvement: No (comment)  Admission diagnosis:  Infected wound [T14.8XXA, L08.9] Hyperglycemia [R73.9] Cellulitis of left foot [L03.116] Cellulitis of left lower extremity [L03.116] AKI (acute kidney injury) (HCC) [N17.9] Patient Active Problem List   Diagnosis Date Noted   Abscess of left foot 10/02/2023   Cellulitis of left foot 10/01/2023   Type 2 diabetes mellitus with hyperglycemia, with long-term current use of insulin  (HCC) 10/01/2023   Acute kidney injury superimposed on chronic kidney disease (HCC) 10/01/2023   Malnutrition of moderate degree 08/05/2023   Hyperosmolar hyperglycemic state (HHS) (HCC) 08/04/2023   Cellulitis of both lower extremities 08/04/2023   Equinus deformity of right foot 06/16/2023   Ulcer of left foot  with fat layer exposed (HCC) 06/16/2023   Diabetic foot infection (HCC) 06/13/2023   Cellulitis 04/26/2023   Fracture of patella 03/05/2023   Hoarding disorder 07/02/2022   CKD (chronic kidney disease) stage 4, GFR 15-29 ml/min (HCC) 06/29/2022   Hypertension  associated with diabetes (HCC) 06/29/2022   Osteomyelitis (HCC) 06/28/2022   Diabetes mellitus (HCC) 04/29/2022   Mild cognitive impairment 06/26/2021   Non-compliance 04/11/2019   Health care maintenance 09/15/2014   Gout 12/25/2013   Hyperlipidemia associated with type 2 diabetes mellitus (HCC) 12/25/2013   Hypertension in stage 4 chronic kidney disease due to type 2 diabetes mellitus (HCC) 12/25/2013   Osteoporosis 12/25/2013   PCP:  [facility] Pharmacy:   [facility] [place], [place] - [address] [place]. [address] [place] [address] Phone: [phone] Fax: [phone]  [facility], [place] - [address] [place] [address] Phone: [phone] Fax: [phone]     Social Drivers of Health (SDOH) Social History: SDOH Screenings   Food Insecurity: No Food Insecurity (10/01/2023)  Housing: Low Risk  (10/01/2023)  Transportation Needs: No Transportation Needs (10/01/2023)  Utilities: Not At Risk (10/01/2023)  Social Connections: Moderately Isolated (10/01/2023)  Tobacco Use: Medium Risk (10/03/2023)   SDOH Interventions:     Readmission Risk Interventions    04/28/2023    3:08 PM  Readmission Risk Prevention Plan  Transportation Screening Complete  PCP or Specialist Appt within 5-7 Days Complete  Home Care Screening Complete  Medication Review (RN CM) Complete

## 2023-10-03 NOTE — Anesthesia Preprocedure Evaluation (Signed)
 Anesthesia Evaluation  Patient identified by MRN, date of birth, ID band Patient awake    Reviewed: Allergy & Precautions, H&P , NPO status , Patient's Chart, lab work & pertinent test results  Airway Mallampati: II   Neck ROM: full    Dental   Pulmonary former smoker   breath sounds clear to auscultation       Cardiovascular hypertension,  Rhythm:regular Rate:Normal     Neuro/Psych  Headaches PSYCHIATRIC DISORDERS     Dementia    GI/Hepatic ,GERD  ,,  Endo/Other  diabetes, Type 2    Renal/GU Renal InsufficiencyRenal disease     Musculoskeletal  (+) Arthritis ,    Abdominal   Peds  Hematology   Anesthesia Other Findings   Reproductive/Obstetrics                             Anesthesia Physical Anesthesia Plan  ASA: 3  Anesthesia Plan: MAC   Post-op Pain Management:    Induction: Intravenous  PONV Risk Score and Plan: 2 and Propofol  infusion and Treatment may vary due to age or medical condition  Airway Management Planned: Simple Face Mask  Additional Equipment:   Intra-op Plan:   Post-operative Plan:   Informed Consent: I have reviewed the patients History and Physical, chart, labs and discussed the procedure including the risks, benefits and alternatives for the proposed anesthesia with the patient or authorized representative who has indicated his/her understanding and acceptance.     Dental advisory given  Plan Discussed with: CRNA, Anesthesiologist and Surgeon  Anesthesia Plan Comments:        Anesthesia Quick Evaluation

## 2023-10-03 NOTE — Progress Notes (Signed)
 History and Physical Interval Note:  10/03/2023 10:29 AM  Joy Tapia  has presented today for surgery, with the diagnosis of wound 1st webspace left foot with concern for abscess.  The various methods of treatment have been discussed with the patient and family. After consideration of risks, benefits and other options for treatment, the patient has consented to   Procedure(s): IRRIGATION AND DEBRIDEMENT ABSCESS (Left) as a surgical intervention.  The patient's history has been reviewed, patient examined, no change in status, stable for surgery.  I have reviewed the patient's chart and labs.  Questions were answered to the patient's satisfaction.     Karlene Overcast Katiana Ruland

## 2023-10-04 DIAGNOSIS — E119 Type 2 diabetes mellitus without complications: Secondary | ICD-10-CM

## 2023-10-04 DIAGNOSIS — L02416 Cutaneous abscess of left lower limb: Secondary | ICD-10-CM | POA: Diagnosis not present

## 2023-10-04 DIAGNOSIS — L03116 Cellulitis of left lower limb: Secondary | ICD-10-CM | POA: Diagnosis not present

## 2023-10-04 LAB — COMPREHENSIVE METABOLIC PANEL WITH GFR
ALT: 18 U/L (ref 0–44)
AST: 34 U/L (ref 15–41)
Albumin: 2.2 g/dL — ABNORMAL LOW (ref 3.5–5.0)
Alkaline Phosphatase: 99 U/L (ref 38–126)
Anion gap: 13 (ref 5–15)
BUN: 11 mg/dL (ref 8–23)
CO2: 23 mmol/L (ref 22–32)
Calcium: 8.2 mg/dL — ABNORMAL LOW (ref 8.9–10.3)
Chloride: 103 mmol/L (ref 98–111)
Creatinine, Ser: 1.31 mg/dL — ABNORMAL HIGH (ref 0.44–1.00)
GFR, Estimated: 41 mL/min — ABNORMAL LOW (ref >60)
Glucose, Bld: 221 mg/dL — ABNORMAL HIGH (ref 70–99)
Potassium: 3.1 mmol/L — ABNORMAL LOW (ref 3.5–5.1)
Sodium: 139 mmol/L (ref 135–145)
Total Bilirubin: 0.5 mg/dL (ref 0.0–1.2)
Total Protein: 6 g/dL — ABNORMAL LOW (ref 6.5–8.1)

## 2023-10-04 LAB — CBC
HCT: 33.1 % — ABNORMAL LOW (ref 36.0–46.0)
Hemoglobin: 10.7 g/dL — ABNORMAL LOW (ref 12.0–15.0)
MCH: 27 pg (ref 26.0–34.0)
MCHC: 32.3 g/dL (ref 30.0–36.0)
MCV: 83.6 fL (ref 80.0–100.0)
Platelets: 308 10*3/uL (ref 150–400)
RBC: 3.96 MIL/uL (ref 3.87–5.11)
RDW: 15.6 % — ABNORMAL HIGH (ref 11.5–15.5)
WBC: 13.4 10*3/uL — ABNORMAL HIGH (ref 4.0–10.5)
nRBC: 0 % (ref 0.0–0.2)

## 2023-10-04 LAB — GLUCOSE, CAPILLARY
Glucose-Capillary: 220 mg/dL — ABNORMAL HIGH (ref 70–99)
Glucose-Capillary: 311 mg/dL — ABNORMAL HIGH (ref 70–99)
Glucose-Capillary: 287 mg/dL — ABNORMAL HIGH (ref 70–99)
Glucose-Capillary: 226 mg/dL — ABNORMAL HIGH (ref 70–99)

## 2023-10-04 LAB — MAGNESIUM: Magnesium: 1.7 mg/dL (ref 1.7–2.4)

## 2023-10-04 MED ORDER — VANCOMYCIN HCL IN DEXTROSE 1-5 GM/200ML-% IV SOLN: 1000.0000 mg | INTRAVENOUS | Status: DC

## 2023-10-04 MED ORDER — SODIUM CHLORIDE 0.9 % IV SOLN
100.0000 mg | INTRAVENOUS | Status: DC
  Administered 2023-10-04 – 2023-10-05 (×2): 100 mg via INTRAVENOUS
  Filled 2023-10-04 (×3): qty 5

## 2023-10-04 MED ORDER — HYDRALAZINE HCL 20 MG/ML IJ SOLN
5.0000 mg | INTRAMUSCULAR | Status: DC | PRN
  Administered 2023-10-04 – 2023-10-05 (×2): 5 mg via INTRAVENOUS
  Filled 2023-10-04 (×2): qty 1

## 2023-10-04 MED ORDER — POTASSIUM CHLORIDE CRYS ER 20 MEQ PO TBCR
40.0000 meq | EXTENDED_RELEASE_TABLET | Freq: Once | ORAL | Status: AC
  Administered 2023-10-04: 40 meq via ORAL
  Filled 2023-10-04: qty 2

## 2023-10-04 MED ORDER — INSULIN GLARGINE-YFGN 100 UNIT/ML ~~LOC~~ SOLN
16.0000 [IU] | Freq: Every day | SUBCUTANEOUS | Status: DC
  Administered 2023-10-04 – 2023-10-05 (×2): 16 [IU] via SUBCUTANEOUS
  Filled 2023-10-04 (×3): qty 0.16

## 2023-10-04 MED ORDER — AMLODIPINE BESYLATE 5 MG PO TABS
5.0000 mg | ORAL_TABLET | Freq: Every day | ORAL | Status: DC
  Administered 2023-10-04 – 2023-10-08 (×5): 5 mg via ORAL
  Filled 2023-10-04 (×5): qty 1

## 2023-10-04 MED ORDER — FLUCONAZOLE IN SODIUM CHLORIDE 200-0.9 MG/100ML-% IV SOLN
200.0000 mg | INTRAVENOUS | Status: DC
  Filled 2023-10-04: qty 100

## 2023-10-04 MED ORDER — INSULIN ASPART 100 UNIT/ML IJ SOLN
6.0000 [IU] | Freq: Three times a day (TID) | INTRAMUSCULAR | Status: DC
  Administered 2023-10-04 – 2023-10-05 (×3): 6 [IU] via SUBCUTANEOUS

## 2023-10-04 MED ORDER — MAGNESIUM SULFATE 2 GM/50ML IV SOLN
2.0000 g | Freq: Once | INTRAVENOUS | Status: AC
  Administered 2023-10-04: 2 g via INTRAVENOUS
  Filled 2023-10-04: qty 50

## 2023-10-04 NOTE — Progress Notes (Signed)
 Pt awake most of this shift. She has ambulated the halls without difficuly. Drsg remain intact to her feet/

## 2023-10-04 NOTE — Progress Notes (Signed)
 Pharmacy Antibiotic Note  Joy Tapia is a 83 y.o. female admitted on 10/01/2023 with L foot wound infection.  Pharmacy has been consulted for vancomycin  and Unasyn  dosing. Cr up on admit.  Plan: Vancomycin  dose changed to 1000 mg IV q48h - est AUC 483 (scr down to  1.31) Continue Unasyn  3g IV q12h Stop dates are 5/13 F/up results of cultures and speak with provider about extending pending results  Height: 5\' 2"  (157.5 cm) Weight: 54.2 kg (119 lb 7.8 oz) IBW/kg (Calculated) : 50.1  Temp (24hrs), Avg:98.7 F (37.1 C), Min:98.2 F (36.8 C), Max:99.3 F (37.4 C)  Recent Labs  Lab 10/01/23 1530 10/01/23 1609 10/01/23 1758 10/02/23 0615 10/03/23 0100 10/04/23 0606  WBC 15.5*  --   --  15.8* 16.2* 13.4*  CREATININE 1.85*  --   --   --  1.58* 1.31*  LATICACIDVEN  --  3.0* 1.9  --   --   --     Estimated Creatinine Clearance: 26.6 mL/min (A) (by C-G formula based on SCr of 1.31 mg/dL (H)).    Allergies  Allergen Reactions   Atorvastatin Diarrhea    Allergy not listed on MAR    Azithromycin Other (See Comments)    Pt states burns her insides. Allergy not listed on MAR     Erythromycin Other (See Comments)    Unknown. Allergy not listed on MAR    Other Other (See Comments)    Adhesive bandage, Anesthesia Extension set  -  unknown. Allergy not listed on MAR      Propoxyphene Other (See Comments)    Allergy not listed on MAR      Antimicrobials this admission: Unasyn  5/7 >> 5/13 Vancomycin  5/7 >> 5/13  Juanelle Trueheart BS, PharmD, BCPS Clinical Pharmacist 10/04/2023 8:23 AM  Contact: [phone] after 3 PM  "Be curious, not judgmental..." -Rumalda Counter
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
## 2023-10-04 NOTE — Plan of Care (Signed)

## 2023-10-04 NOTE — Progress Notes (Signed)
 Mobility Specialist Progress Note   10/04/23 1130  Mobility  Activity Ambulated with assistance in hallway;Transferred from bed to chair  Level of Assistance Standby assist, set-up cues, supervision of patient - no hands on  Assistive Device Front wheel walker  Distance Ambulated (ft) 300 ft  Range of Motion/Exercises Active;All extremities  Activity Response Tolerated well  Mobility Specialist Start Time (ACUTE ONLY) 1130  Mobility Specialist Stop Time (ACUTE ONLY) 1158  Mobility Specialist Time Calculation (min) (ACUTE ONLY) 28 min   Patient received dangling EOB and agreeable to participate. Presented slightly confused or forgetful but answered questions appropriately. Required total A to donn [place] shoes. Stood and ambulated supervision level with slow steady gait. Needed verbal cues to keep both hands on RW. Observed x1 LOB requiring min A to regain balance. Denied dizziness/lightheadedness. Returned to room without complaint or incident. Was left in recliner with all needs met, call bell in reach and chair alarm set.   [place] Kabella Cassidy, BS EXP Mobility Specialist Please contact via SecureChat or Rehab office at [phone]

## 2023-10-04 NOTE — Progress Notes (Signed)
 PROGRESS NOTE  Joy Tapia EAV:[id number] DOB: 04-Nov-1940 DOA: 10/01/2023 PCP: [facility]   LOS: 3 days   Brief Narrative / Interim history: 83 year old female with history of DM 2, CKD 3B, HTN, HLD, osteomyelitis of the right foot status post TMA, mild dementia who comes into the hospital from ALF for worsening left foot infection.  She resides at [facility]F, and has been having left foot wound that has not been healing.  She was found to have a white count of 15.5 in the ER, and an MRI did not show any acute osteomyelitis but it was not a good exam and it was motion degraded.  Podiatry was consulted, she was admitted to the hospital and given antibiotics  Subjective / 24h Interval events: She is doing well, denies any fever, chills, shortness of breath  Assesement and Plan: Principal Problem:   Cellulitis of left foot Active Problems:   Type 2 diabetes mellitus with hyperglycemia, with long-term current use of insulin  (HCC)   Acute kidney injury superimposed on chronic kidney disease (HCC)   Hypertension associated with diabetes (HCC)   Hyperlipidemia associated with type 2 diabetes mellitus (HCC)   Abscess of left foot  Principal problem Diabetic wound/cellulitis of the left foot -worsening wound with associated cellulitis and leukocytosis.  MRI limited due to motion degradation but no acute osteomyelitis seen.  Patient was started on antibiotics - ABIs pending, but January 2025 ABIs were fairly unremarkable. - Podiatry consulted, underwent debridement in the OR on 5/9 - Cultures with gram-positive rods, gram-positive cocci as well as yeast with pseudohyphae.  ID consulted  Active problems Acute kidney injury on CKD stage IIIb-creatinine 1.8 on admission, baseline 1.4-1.5.  Received fluids, creatinine has stabilized and currently is at baseline  Essential hypertension-continue Toprol   Hyperlipidemia-continue statin  Mild dementia-delirium  precautions  History of gout-holding febuxostat   DM2, insulin -dependent, poorly controlled, with hyperglycemia -patient severely hyperglycemic on admission.  She tells me she is not checking her sugars regularly at home.  Overall CBGs better, increase insulin  again today  Lab Results  Component Value Date   HGBA1C 9.2 (H) 06/13/2023   CBG (last 3)  Recent Labs    10/03/23 1643 10/03/23 2051 10/04/23 0731  GLUCAP 280* 200* 220*    Scheduled Meds:  feeding supplement (GLUCERNA SHAKE)  237 mL Oral BID BM   heparin   5,000 Units Subcutaneous Q8H   insulin  aspart  0-5 Units Subcutaneous QHS   insulin  aspart  0-9 Units Subcutaneous TID WC   insulin  aspart  5 Units Subcutaneous TID WC   insulin  glargine-yfgn  14 Units Subcutaneous QHS   metoprolol  succinate  50 mg Oral q morning   multivitamin  1 tablet Oral QHS   pantoprazole   40 mg Oral Daily   potassium chloride   40 mEq Oral Once   pravastatin   20 mg Oral QHS   Continuous Infusions:  ampicillin -sulbactam (UNASYN ) IV 3 g (10/04/23 0851)   magnesium  sulfate bolus IVPB     micafungin (MYCAMINE) 100 mg in sodium chloride  0.9 % 100 mL IVPB     [START ON 10/05/2023] vancomycin      PRN Meds:.acetaminophen  **OR** acetaminophen , fluticasone , ondansetron  **OR** ondansetron  (ZOFRAN ) IV, senna-docusate  Current Outpatient Medications  Medication Instructions   acetaminophen  (TYLENOL ) 650 mg, Every 8 hours PRN   albuterol  (VENTOLIN  HFA) 108 (90 Base) MCG/ACT inhaler 2 puffs, Every 4 hours PRN   aspirin  EC 81 mg, Every morning   cadexomer iodine  (IODOSORB) 0.9 % gel 1 Application,  Topical, Daily PRN   cetirizine (ZYRTEC) 10 mg, Every morning   Febuxostat  80 mg, Every morning   fluticasone  (FLONASE ) 50 MCG/ACT nasal spray 1 spray, Daily PRN   furosemide  (LASIX ) 40 mg, Oral, Daily   insulin  detemir (LEVEMIR ) 6 Units, Subcutaneous, Daily   Insulin  Syringe-Needle U-100 (INSULIN  SYRINGE .5CC/28G) 28G X 1/2" 0.5 ML MISC 1 Application, Does  not apply, Daily   liver oil-zinc  oxide (DESITIN) 40 % ointment Topical, Every 6 hours PRN   Metoprolol  Succinate (KAPSPARGO  SPRINKLE) 50 MG CS24 1 capsule, Oral, Every morning   metoprolol  succinate (TOPROL -XL) 50 mg, Every morning   Multiple Vitamin (MULTIVITAMIN) capsule 1 capsule, Every morning   mupirocin  ointment (BACTROBAN ) 2 % Topical, Every other day   omeprazole (PRILOSEC) 20 mg, Every morning   oxyCODONE  (OXY IR/ROXICODONE ) 5 mg, Oral, Every 6 hours PRN   oxymetazoline (AFRIN) 0.05 % nasal spray 1 spray, 2 times daily PRN   pravastatin  (PRAVACHOL ) 20 mg, Daily at bedtime   vitamin B-12 (CYANOCOBALAMIN) 500 mcg, Oral, Every morning    Diet Orders (From admission, onward)     Start     Ordered   10/03/23 1142  Diet Carb Modified Fluid consistency: Thin; Room service appropriate? Yes  Diet effective now       Question Answer Comment  Diet-HS Snack? Nothing   Calorie Level Medium 1600-2000   Fluid consistency: Thin   Room service appropriate? Yes      10/03/23 1142            DVT prophylaxis: heparin  injection 5,000 Units Start: 10/01/23 2200   Lab Results  Component Value Date   PLT 308 10/04/2023      Code Status: Full Code  Family Communication: no family at bedside   Status is: Inpatient Remains inpatient appropriate because: severity of illness  Level of care: Med-Surg  Consultants:  Podiatry  Objective: Vitals:   10/03/23 2052 10/04/23 0459 10/04/23 0510 10/04/23 0733  BP: (!) 143/59  (!) 143/58 (!) 144/68  Pulse: 88  76 79  Resp: 17  17 16   Temp: 99.2 F (37.3 C)  98.5 F (36.9 C) 98.6 F (37 C)  TempSrc: Oral  Oral Oral  SpO2: 100%  100% 100%  Weight:  54.2 kg    Height:        Intake/Output Summary (Last 24 hours) at 10/04/2023 1030 Last data filed at 10/03/2023 2041 Gross per 24 hour  Intake 400 ml  Output 205 ml  Net 195 ml   Wt Readings from Last 3 Encounters:  10/04/23 54.2 kg  08/14/23 56.5 kg  08/04/23 56.5 kg     Examination: Constitutional: NAD Eyes: lids and conjunctivae normal, no scleral icterus ENMT: mmm Neck: normal, supple Respiratory: clear to auscultation bilaterally, no wheezing, no crackles. Normal respiratory effort.  Cardiovascular: Regular rate and rhythm, no murmurs / rubs / gallops. No LE edema. Abdomen: soft, no distention, no tenderness. Bowel sounds positive.    Data Reviewed: I have independently reviewed following labs and imaging studies   CBC Recent Labs  Lab 10/01/23 1530 10/02/23 0615 10/03/23 0100 10/04/23 0606  WBC 15.5* 15.8* 16.2* 13.4*  HGB 15.1* 12.4 10.9* 10.7*  HCT 46.0 38.5 33.8* 33.1*  PLT 205 200 294 308  MCV 84.9 85.6 84.3 83.6  MCH 27.9 27.6 27.2 27.0  MCHC 32.8 32.2 32.2 32.3  RDW 15.8* 15.8* 15.4 15.6*  LYMPHSABS 1.3  --   --   --   MONOABS 1.1*  --   --   --  EOSABS 0.0  --   --   --   BASOSABS 0.1  --   --   --     Recent Labs  Lab 10/01/23 1530 10/01/23 1609 10/01/23 1758 10/03/23 0100 10/04/23 0606  NA 126*  --   --  132* 139  K 4.4  --   --  3.6 3.1*  CL 86*  --   --  97* 103  CO2 23  --   --  24 23  GLUCOSE 553*  --   --  226* 221*  BUN 23  --   --  18 11  CREATININE 1.85*  --   --  1.58* 1.31*  CALCIUM  8.9  --   --  8.2* 8.2*  AST 28  --   --   --  34  ALT 21  --   --   --  18  ALKPHOS 109  --   --   --  99  BILITOT 0.9  --   --   --  0.5  ALBUMIN 3.0*  --   --   --  2.2*  MG  --   --   --  1.7 1.7  LATICACIDVEN  --  3.0* 1.9  --   --   INR 1.2  --   --   --   --     ------------------------------------------------------------------------------------------------------------------ No results for input(s): "CHOL", "HDL", "LDLCALC", "TRIG", "CHOLHDL", "LDLDIRECT" in the last 72 hours.  Lab Results  Component Value Date   HGBA1C 9.2 (H) 06/13/2023   ------------------------------------------------------------------------------------------------------------------ No results for input(s): "TSH", "T4TOTAL",  "T3FREE", "THYROIDAB" in the last 72 hours.  Invalid input(s): "FREET3"  Cardiac Enzymes No results for input(s): "CKMB", "TROPONINI", "MYOGLOBIN" in the last 168 hours.  Invalid input(s): "CK" ------------------------------------------------------------------------------------------------------------------ No results found for: "BNP"  CBG: Recent Labs  Lab 10/03/23 1141 10/03/23 1218 10/03/23 1643 10/03/23 2051 10/04/23 0731  GLUCAP 199* 201* 280* 200* 220*    Recent Results (from the past 240 hours)  Blood Culture (routine x 2)     Status: None (Preliminary result)   Collection Time: 10/01/23  3:20 PM   Specimen: BLOOD  Result Value Ref Range Status   Specimen Description BLOOD RIGHT ANTECUBITAL  Final   Special Requests   Final    BOTTLES DRAWN AEROBIC AND ANAEROBIC Blood Culture results may not be optimal due to an inadequate volume of blood received in culture bottles   Culture   Final    NO GROWTH 3 DAYS Performed at [facility], [address], [place], [place] [address]    Report Status PENDING  Incomplete  Blood Culture (routine x 2)     Status: None (Preliminary result)   Collection Time: 10/01/23  9:10 PM   Specimen: BLOOD LEFT HAND  Result Value Ref Range Status   Specimen Description BLOOD LEFT HAND  Final   Special Requests   Final    BOTTLES DRAWN AEROBIC AND ANAEROBIC Blood Culture results may not be optimal due to an inadequate volume of blood received in culture bottles   Culture   Final    NO GROWTH 3 DAYS Performed at [facility], [address] [place], [place] [address]    Report Status PENDING  Incomplete  Aerobic/Anaerobic Culture w Gram Stain (surgical/deep wound)     Status: None (Preliminary result)   Collection Time: 10/03/23 11:19 AM   Specimen: Soft Tissue, Other  Result Value Ref Range Status  Specimen Description TISSUE  Final   Special Requests LEFT FOOT  Final   Gram Stain   Final    RARE WBC PRESENT,BOTH PMN AND  MONONUCLEAR FEW GRAM POSITIVE RODS RARE GRAM POSITIVE COCCI IN PAIRS    Culture   Final    CULTURE REINCUBATED FOR BETTER GROWTH Performed at [facility], [address] [place], [place] [address]    Report Status PENDING  Incomplete  Aerobic/Anaerobic Culture w Gram Stain (surgical/deep wound)     Status: None (Preliminary result)   Collection Time: 10/03/23 11:25 AM   Specimen: Soft Tissue, Other  Result Value Ref Range Status   Specimen Description TISSUE  Final   Special Requests LEFT FOOT  Final   Gram Stain   Final    FEW WBC PRESENT, PREDOMINANTLY PMN RARE GRAM POSITIVE RODS RARE YEAST WITH PSEUDOHYPHAE    Culture   Final    NO GROWTH < 24 HOURS Performed at [facility], [address] [place], [place] [address]    Report Status PENDING  Incomplete     Radiology Studies: No results found.    Kathlen Para, MD, PhD [facility]  Between 7 am - 7 pm I am available, please contact me via Amion (for emergencies) or Securechat (non urgent messages)  Between 7 pm - 7 am I am not available, please contact night coverage MD/APP via Amion

## 2023-10-04 NOTE — Progress Notes (Signed)
 Pharmacy Antifungal Note  Joy Tapia is a 83 y.o. female admitted on 10/01/2023 with Left foot abscess s/p I&D .  Pharmacy has been consulted for micafungin dosing.  Plan: Discontinue fluconazole Start micafungin 100 mg iv qday  Height: 5\' 2"  (157.5 cm) Weight: 54.2 kg (119 lb 7.8 oz) IBW/kg (Calculated) : 50.1  Temp (24hrs), Avg:98.7 F (37.1 C), Min:98.2 F (36.8 C), Max:99.3 F (37.4 C)  Recent Labs  Lab 10/01/23 1530 10/01/23 1609 10/01/23 1758 10/02/23 0615 10/03/23 0100 10/04/23 0606  WBC 15.5*  --   --  15.8* 16.2* 13.4*  CREATININE 1.85*  --   --   --  1.58* 1.31*  LATICACIDVEN  --  3.0* 1.9  --   --   --     Estimated Creatinine Clearance: 26.6 mL/min (A) (by C-G formula based on SCr of 1.31 mg/dL (H)).    Allergies  Allergen Reactions   Atorvastatin Diarrhea    Allergy not listed on MAR    Azithromycin Other (See Comments)    Pt states burns her insides. Allergy not listed on MAR     Erythromycin Other (See Comments)    Unknown. Allergy not listed on MAR    Other Other (See Comments)    Adhesive bandage, Anesthesia Extension set  -  unknown. Allergy not listed on MAR      Propoxyphene Other (See Comments)    Allergy not listed on MAR      Antifungal this admission: micafungin 5/10 >>    Microbiology results: 5/9 OR left foot tissue culture showing rare yeast with pseudohyphae   Thank you for allowing pharmacy to be a part of this patient's care.  Marleta Simmer BS, PharmD, BCPS Clinical Pharmacist 10/04/2023 9:33 AM  Contact: [phone] after 3 PM  "Be curious, not judgmental..." -Rumalda Counter

## 2023-10-04 NOTE — Plan of Care (Signed)
 Pt alert and oriented x3. She is forgetful to place at intervals. Drsgs dry and intact to bilateral feet. She has ambulated the halls this evening. No c/o dizziness nor weakness. Tolerating IV antibiotic and po intake. No acute change in status at present. Encouraged pt to elevate lower exts when sitting. Call light in reach. Relaxing in the bedside chair at present.

## 2023-10-05 DIAGNOSIS — L03116 Cellulitis of left lower limb: Secondary | ICD-10-CM | POA: Diagnosis not present

## 2023-10-05 LAB — GLUCOSE, CAPILLARY
Glucose-Capillary: 129 mg/dL — ABNORMAL HIGH (ref 70–99)
Glucose-Capillary: 234 mg/dL — ABNORMAL HIGH (ref 70–99)
Glucose-Capillary: 232 mg/dL — ABNORMAL HIGH (ref 70–99)
Glucose-Capillary: 255 mg/dL — ABNORMAL HIGH (ref 70–99)

## 2023-10-05 LAB — CBC
HCT: 34.5 % — ABNORMAL LOW (ref 36.0–46.0)
Hemoglobin: 10.8 g/dL — ABNORMAL LOW (ref 12.0–15.0)
MCH: 26.9 pg (ref 26.0–34.0)
MCHC: 31.3 g/dL (ref 30.0–36.0)
MCV: 85.8 fL (ref 80.0–100.0)
Platelets: 266 10*3/uL (ref 150–400)
RBC: 4.02 MIL/uL (ref 3.87–5.11)
RDW: 15.8 % — ABNORMAL HIGH (ref 11.5–15.5)
WBC: 13.1 10*3/uL — ABNORMAL HIGH (ref 4.0–10.5)
nRBC: 0 % (ref 0.0–0.2)

## 2023-10-05 LAB — BASIC METABOLIC PANEL WITH GFR
Anion gap: 9 (ref 5–15)
BUN: 11 mg/dL (ref 8–23)
CO2: 21 mmol/L — ABNORMAL LOW (ref 22–32)
Calcium: 8.1 mg/dL — ABNORMAL LOW (ref 8.9–10.3)
Chloride: 109 mmol/L (ref 98–111)
Creatinine, Ser: 1.21 mg/dL — ABNORMAL HIGH (ref 0.44–1.00)
GFR, Estimated: 45 mL/min — ABNORMAL LOW (ref >60)
Glucose, Bld: 128 mg/dL — ABNORMAL HIGH (ref 70–99)
Potassium: 3.6 mmol/L (ref 3.5–5.1)
Sodium: 139 mmol/L (ref 135–145)

## 2023-10-05 LAB — MAGNESIUM: Magnesium: 2.2 mg/dL (ref 1.7–2.4)

## 2023-10-05 MED ORDER — QUETIAPINE FUMARATE 25 MG PO TABS
12.5000 mg | ORAL_TABLET | Freq: Every day | ORAL | Status: DC
  Administered 2023-10-05 – 2023-10-07 (×3): 12.5 mg via ORAL
  Filled 2023-10-05 (×3): qty 1

## 2023-10-05 MED ORDER — HALOPERIDOL LACTATE 5 MG/ML IJ SOLN
1.0000 mg | Freq: Four times a day (QID) | INTRAMUSCULAR | Status: DC | PRN
  Administered 2023-10-05: 1 mg via INTRAVENOUS
  Filled 2023-10-05 (×2): qty 0.2

## 2023-10-05 MED ORDER — INSULIN ASPART 100 UNIT/ML IJ SOLN
8.0000 [IU] | Freq: Three times a day (TID) | INTRAMUSCULAR | Status: DC
  Administered 2023-10-05 – 2023-10-06 (×3): 8 [IU] via SUBCUTANEOUS

## 2023-10-05 MED ORDER — VANCOMYCIN HCL IN DEXTROSE 1-5 GM/200ML-% IV SOLN
1000.0000 mg | INTRAVENOUS | Status: DC
  Administered 2023-10-05: 1000 mg via INTRAVENOUS
  Filled 2023-10-05: qty 200

## 2023-10-05 MED ORDER — SODIUM CHLORIDE 0.9 % IV SOLN
3.0000 g | Freq: Two times a day (BID) | INTRAVENOUS | Status: DC
  Administered 2023-10-05 – 2023-10-06 (×3): 3 g via INTRAVENOUS
  Filled 2023-10-05 (×3): qty 8

## 2023-10-05 MED ORDER — LOPERAMIDE HCL 2 MG PO CAPS
2.0000 mg | ORAL_CAPSULE | ORAL | Status: DC | PRN
  Administered 2023-10-05: 2 mg via ORAL
  Filled 2023-10-05: qty 1

## 2023-10-05 NOTE — Progress Notes (Addendum)
 PROGRESS NOTE  Joy Tapia NWG:[id number] DOB: [birth year] DOA: 10/01/2023 PCP: [facility]   LOS: 4 days   Brief Narrative / Interim history: 83 year old female with history of DM 2, CKD 3B, HTN, HLD, osteomyelitis of the right foot status post TMA, mild dementia who comes into the hospital from ALF for worsening left foot infection.  She resides at [facility], and has been having left foot wound that has not been healing.  She was found to have a white count of 15.5 in the ER, and an MRI did not show any acute osteomyelitis but it was not a good exam and it was motion degraded.  Podiatry was consulted, she was admitted to the hospital and given antibiotics  Subjective / 24h Interval events: No specific complaints, no chest pain, no shortness of breath.  Bedside RN reports increased confusion overnight, no better  Assesement and Plan: Principal Problem:   Cellulitis of left foot Active Problems:   Type 2 diabetes mellitus with hyperglycemia, with long-term current use of insulin  (HCC)   Acute kidney injury superimposed on chronic kidney disease (HCC)   Hypertension associated with diabetes (HCC)   Hyperlipidemia associated with type 2 diabetes mellitus (HCC)   Abscess of left foot  Principal problem Diabetic wound/cellulitis of the left foot -worsening wound with associated cellulitis and leukocytosis.  MRI limited due to motion degradation but no acute osteomyelitis seen.  Patient was started on antibiotics - ABIs pending, but January 2025 ABIs were fairly unremarkable. - Podiatry consulted, underwent debridement in the OR on 5/9 - Cultures with gram-positive rods, gram-positive cocci as well as yeast with pseudohyphae.  ID consulted, currently on Unasyn , Vanco and micafungin.  Awaiting speciation  Active problems Acute kidney injury on CKD stage IIIb-creatinine 1.8 on admission, baseline 1.4-1.5.  Received fluids, creatinine has stabilized and currently is at  baseline  Essential hypertension-continue Toprol , blood pressure acceptable  Hyperlipidemia-continue statin  Mild dementia-delirium precautions, increased confusion overnight, start low-dose Seroquel tonight  History of gout-holding febuxostat   DM2, insulin -dependent, poorly controlled, with hyperglycemia -patient severely hyperglycemic on admission.  She tells me she is not checking her sugars regularly at home.  Overall CBGs better, fasting this morning good, increase mealtime  Lab Results  Component Value Date   HGBA1C 9.2 (H) 06/13/2023   CBG (last 3)  Recent Labs    10/04/23 1540 10/04/23 2057 10/05/23 0734  GLUCAP 287* 226* 129*    Scheduled Meds:  amLODipine   5 mg Oral Daily   feeding supplement (GLUCERNA SHAKE)  237 mL Oral BID BM   heparin   5,000 Units Subcutaneous Q8H   insulin  aspart  0-5 Units Subcutaneous QHS   insulin  aspart  0-9 Units Subcutaneous TID WC   insulin  aspart  6 Units Subcutaneous TID WC   insulin  glargine-yfgn  16 Units Subcutaneous QHS   metoprolol  succinate  50 mg Oral q morning   multivitamin  1 tablet Oral QHS   pantoprazole   40 mg Oral Daily   pravastatin   20 mg Oral QHS   QUEtiapine  12.5 mg Oral QHS   Continuous Infusions:  ampicillin -sulbactam (UNASYN ) IV 3 g (10/05/23 0818)   micafungin (MYCAMINE) 100 mg in sodium chloride  0.9 % 100 mL IVPB 100 mg (10/05/23 1007)   vancomycin      PRN Meds:.acetaminophen  **OR** acetaminophen , fluticasone , hydrALAZINE , ondansetron  **OR** ondansetron  (ZOFRAN ) IV, senna-docusate  Current Outpatient Medications  Medication Instructions   acetaminophen  (TYLENOL ) 650 mg, Every 8 hours PRN   albuterol  (VENTOLIN  HFA) 108 (  90 Base) MCG/ACT inhaler 2 puffs, Every 4 hours PRN   aspirin  EC 81 mg, Every morning   cadexomer iodine  (IODOSORB) 0.9 % gel 1 Application, Topical, Daily PRN   cetirizine (ZYRTEC) 10 mg, Every morning   Febuxostat  80 mg, Every morning   fluticasone  (FLONASE ) 50 MCG/ACT nasal spray 1  spray, Daily PRN   furosemide  (LASIX ) 40 mg, Oral, Daily   insulin  detemir (LEVEMIR ) 6 Units, Subcutaneous, Daily   Insulin  Syringe-Needle U-100 (INSULIN  SYRINGE .5CC/28G) 28G X 1/2" 0.5 ML MISC 1 Application, Does not apply, Daily   liver oil-zinc  oxide (DESITIN) 40 % ointment Topical, Every 6 hours PRN   Metoprolol  Succinate (KAPSPARGO  SPRINKLE) 50 MG CS24 1 capsule, Oral, Every morning   metoprolol  succinate (TOPROL -XL) 50 mg, Every morning   Multiple Vitamin (MULTIVITAMIN) capsule 1 capsule, Every morning   mupirocin  ointment (BACTROBAN ) 2 % Topical, Every other day   omeprazole (PRILOSEC) 20 mg, Every morning   oxyCODONE  (OXY IR/ROXICODONE ) 5 mg, Oral, Every 6 hours PRN   oxymetazoline (AFRIN) 0.05 % nasal spray 1 spray, 2 times daily PRN   pravastatin  (PRAVACHOL ) 20 mg, Daily at bedtime   vitamin B-12 (CYANOCOBALAMIN) 500 mcg, Oral, Every morning    Diet Orders (From admission, onward)     Start     Ordered   10/03/23 1142  Diet Carb Modified Fluid consistency: Thin; Room service appropriate? Yes  Diet effective now       Question Answer Comment  Diet-HS Snack? Nothing   Calorie Level Medium 1600-2000   Fluid consistency: Thin   Room service appropriate? Yes      10/03/23 1142            DVT prophylaxis: heparin  injection 5,000 Units Start: 10/01/23 2200   Lab Results  Component Value Date   PLT 266 10/05/2023      Code Status: Full Code  Family Communication: no family at bedside   Status is: Inpatient Remains inpatient appropriate because: severity of illness  Level of care: Med-Surg  Consultants:  Podiatry  Objective: Vitals:   10/04/23 2058 10/05/23 0412 10/05/23 0548 10/05/23 0726  BP: (!) 132/47  (!) 154/67 (!) 177/55  Pulse: 81  80   Resp: 18  18 18   Temp: 99.1 F (37.3 C)  97.6 F (36.4 C) 98.5 F (36.9 C)  TempSrc: Oral  Oral Oral  SpO2: 99%  100% 100%  Weight:  54.7 kg    Height:        Intake/Output Summary (Last 24 hours) at  10/05/2023 1051 Last data filed at 10/04/2023 1650 Gross per 24 hour  Intake 555 ml  Output --  Net 555 ml   Wt Readings from Last 3 Encounters:  10/05/23 54.7 kg  08/14/23 56.5 kg  08/04/23 56.5 kg    Examination: Constitutional: NAD Eyes: lids and conjunctivae normal, no scleral icterus ENMT: mmm Neck: normal, supple Respiratory: clear to auscultation bilaterally, no wheezing, no crackles. Normal respiratory effort.  Cardiovascular: Regular rate and rhythm, no murmurs / rubs / gallops. No LE edema. Abdomen: soft, no distention, no tenderness. Bowel sounds positive.    Data Reviewed: I have independently reviewed following labs and imaging studies   CBC Recent Labs  Lab 10/01/23 1530 10/02/23 0615 10/03/23 0100 10/04/23 0606 10/05/23 0435  WBC 15.5* 15.8* 16.2* 13.4* 13.1*  HGB 15.1* 12.4 10.9* 10.7* 10.8*  HCT 46.0 38.5 33.8* 33.1* 34.5*  PLT 205 200 294 308 266  MCV 84.9 85.6 84.3 83.6 85.8  MCH 27.9 27.6 27.2 27.0 26.9  MCHC 32.8 32.2 32.2 32.3 31.3  RDW 15.8* 15.8* 15.4 15.6* 15.8*  LYMPHSABS 1.3  --   --   --   --   MONOABS 1.1*  --   --   --   --   EOSABS 0.0  --   --   --   --   BASOSABS 0.1  --   --   --   --     Recent Labs  Lab 10/01/23 1530 10/01/23 1609 10/01/23 1758 10/03/23 0100 10/04/23 0606 10/05/23 0435  NA 126*  --   --  132* 139 139  K 4.4  --   --  3.6 3.1* 3.6  CL 86*  --   --  97* 103 109  CO2 23  --   --  24 23 21*  GLUCOSE 553*  --   --  226* 221* 128*  BUN 23  --   --  18 11 11   CREATININE 1.85*  --   --  1.58* 1.31* 1.21*  CALCIUM  8.9  --   --  8.2* 8.2* 8.1*  AST 28  --   --   --  34  --   ALT 21  --   --   --  18  --   ALKPHOS 109  --   --   --  99  --   BILITOT 0.9  --   --   --  0.5  --   ALBUMIN 3.0*  --   --   --  2.2*  --   MG  --   --   --  1.7 1.7 2.2  LATICACIDVEN  --  3.0* 1.9  --   --   --   INR 1.2  --   --   --   --   --      ------------------------------------------------------------------------------------------------------------------ No results for input(s): "CHOL", "HDL", "LDLCALC", "TRIG", "CHOLHDL", "LDLDIRECT" in the last 72 hours.  Lab Results  Component Value Date   HGBA1C 9.2 (H) 06/13/2023   ------------------------------------------------------------------------------------------------------------------ No results for input(s): "TSH", "T4TOTAL", "T3FREE", "THYROIDAB" in the last 72 hours.  Invalid input(s): "FREET3"  Cardiac Enzymes No results for input(s): "CKMB", "TROPONINI", "MYOGLOBIN" in the last 168 hours.  Invalid input(s): "CK" ------------------------------------------------------------------------------------------------------------------ No results found for: "BNP"  CBG: Recent Labs  Lab 10/04/23 0731 10/04/23 1216 10/04/23 1540 10/04/23 2057 10/05/23 0734  GLUCAP 220* 311* 287* 226* 129*    Recent Results (from the past 240 hours)  Blood Culture (routine x 2)     Status: None (Preliminary result)   Collection Time: 10/01/23  3:20 PM   Specimen: BLOOD  Result Value Ref Range Status   Specimen Description BLOOD RIGHT ANTECUBITAL  Final   Special Requests   Final    BOTTLES DRAWN AEROBIC AND ANAEROBIC Blood Culture results may not be optimal due to an inadequate volume of blood received in culture bottles   Culture   Final    NO GROWTH 4 DAYS Performed at [facility], [address], [place], [place] [address]    Report Status PENDING  Incomplete  Blood Culture (routine x 2)     Status: None (Preliminary result)   Collection Time: 10/01/23  9:10 PM   Specimen: BLOOD LEFT HAND  Result Value Ref Range Status   Specimen Description BLOOD LEFT HAND  Final   Special Requests   Final    BOTTLES DRAWN AEROBIC AND ANAEROBIC  Blood Culture results may not be optimal due to an inadequate volume of blood received in culture bottles   Culture   Final    NO GROWTH 4  DAYS Performed at [facility], [address] [place], [place] [address]    Report Status PENDING  Incomplete  Aerobic/Anaerobic Culture w Gram Stain (surgical/deep wound)     Status: None (Preliminary result)   Collection Time: 10/03/23 11:19 AM   Specimen: Soft Tissue, Other  Result Value Ref Range Status   Specimen Description TISSUE  Final   Special Requests LEFT FOOT  Final   Gram Stain   Final    RARE WBC PRESENT,BOTH PMN AND MONONUCLEAR FEW GRAM POSITIVE RODS RARE GRAM POSITIVE COCCI IN PAIRS    Culture   Final    CULTURE REINCUBATED FOR BETTER GROWTH HOLDING FOR POSSIBLE ANAEROBE Performed at [facility], [address] [place], [place] [address]    Report Status PENDING  Incomplete  Aerobic/Anaerobic Culture w Gram Stain (surgical/deep wound)     Status: None (Preliminary result)   Collection Time: 10/03/23 11:25 AM   Specimen: Soft Tissue, Other  Result Value Ref Range Status   Specimen Description TISSUE  Final   Special Requests LEFT FOOT  Final   Gram Stain   Final    FEW WBC PRESENT, PREDOMINANTLY PMN RARE GRAM POSITIVE RODS RARE YEAST WITH PSEUDOHYPHAE    Culture   Final    CULTURE REINCUBATED FOR BETTER GROWTH HOLDING FOR POSSIBLE ANAEROBE Performed at [facility], [address], [place], [place] [address]    Report Status PENDING  Incomplete     Radiology Studies: No results found.    Kathlen Para, MD, PhD [facility]  Between 7 am - 7 pm I am available, please contact me via Amion (for emergencies) or Securechat (non urgent messages)  Between 7 pm - 7 am I am not available, please contact night coverage MD/APP via Amion

## 2023-10-05 NOTE — Plan of Care (Signed)

## 2023-10-05 NOTE — Progress Notes (Signed)
 Pt assisted to the bathroom, with urgency. She had moderate amt loose brown stool. Drsg to her lt foot was changed due to stool on the bandage.

## 2023-10-05 NOTE — Progress Notes (Signed)
 Pt has rested for very short intervals. She c/o a lump on her lt inner thigh. She stated she does not recall how the lump happened. She stated it was there since Saturday afternoon. Call light in reach. Sr x3 elevated. Bed in low position. Bed alarm activated.

## 2023-10-05 NOTE — Plan of Care (Signed)
 Pt alert and orientation, with forgetfulness to place. Ambulated with the front wheel walker, with standby assistance. Skin is dry and itchy to lower extremities. Lotion applied to the dryness. Medicated x1 for elevated SBP (174). Seroquel 12.5mg  effective at present. Sitter in the room. Pt resting in the recliner. Has tolerated the IV antibiotics (Unasyn  and Vancomycin ). Call light in reach. Chair alarm in place.

## 2023-10-06 ENCOUNTER — Encounter (HOSPITAL_COMMUNITY): Payer: Self-pay | Admitting: Podiatry

## 2023-10-06 DIAGNOSIS — L03116 Cellulitis of left lower limb: Secondary | ICD-10-CM | POA: Diagnosis not present

## 2023-10-06 DIAGNOSIS — B952 Enterococcus as the cause of diseases classified elsewhere: Secondary | ICD-10-CM | POA: Diagnosis not present

## 2023-10-06 DIAGNOSIS — B9689 Other specified bacterial agents as the cause of diseases classified elsewhere: Secondary | ICD-10-CM

## 2023-10-06 LAB — GLUCOSE, CAPILLARY
Glucose-Capillary: 267 mg/dL — ABNORMAL HIGH (ref 70–99)
Glucose-Capillary: 234 mg/dL — ABNORMAL HIGH (ref 70–99)
Glucose-Capillary: 130 mg/dL — ABNORMAL HIGH (ref 70–99)
Glucose-Capillary: 192 mg/dL — ABNORMAL HIGH (ref 70–99)

## 2023-10-06 LAB — CULTURE, BLOOD (ROUTINE X 2)
Culture: NO GROWTH
Culture: NO GROWTH

## 2023-10-06 LAB — SURGICAL PATHOLOGY

## 2023-10-06 MED ORDER — FLUCONAZOLE 200 MG PO TABS
200.0000 mg | ORAL_TABLET | Freq: Every day | ORAL | Status: DC
  Administered 2023-10-06 – 2023-10-08 (×3): 200 mg via ORAL
  Filled 2023-10-06 (×3): qty 1

## 2023-10-06 MED ORDER — AMOXICILLIN-POT CLAVULANATE 875-125 MG PO TABS
1.0000 | ORAL_TABLET | Freq: Two times a day (BID) | ORAL | Status: DC
Start: 2023-10-06 — End: 2023-10-08
  Administered 2023-10-06 – 2023-10-08 (×4): 1 via ORAL
  Filled 2023-10-06 (×4): qty 1

## 2023-10-06 MED ORDER — NALOXONE HCL 0.4 MG/ML IJ SOLN: 0.4000 mg | INTRAMUSCULAR | Status: DC | PRN

## 2023-10-06 MED ORDER — INSULIN ASPART 100 UNIT/ML IJ SOLN
10.0000 [IU] | Freq: Three times a day (TID) | INTRAMUSCULAR | Status: DC
  Administered 2023-10-06 – 2023-10-08 (×7): 10 [IU] via SUBCUTANEOUS

## 2023-10-06 MED ORDER — OXYCODONE HCL 5 MG PO TABS: 5.0000 mg | ORAL_TABLET | Freq: Once | ORAL | Status: DC | PRN

## 2023-10-06 MED ORDER — INSULIN GLARGINE-YFGN 100 UNIT/ML ~~LOC~~ SOLN
18.0000 [IU] | Freq: Every day | SUBCUTANEOUS | Status: DC
  Administered 2023-10-06 – 2023-10-07 (×2): 18 [IU] via SUBCUTANEOUS
  Filled 2023-10-06 (×3): qty 0.18

## 2023-10-06 NOTE — Care Management Important Message (Signed)
 Important Message  Patient Details  Name: Joy Tapia MRN: [id number] Date of Birth: 1940-09-09   Important Message Given:  Yes - Medicare IM     Wynonia Hedges 10/06/2023, 3:00 PM

## 2023-10-06 NOTE — Anesthesia Postprocedure Evaluation (Signed)
 Anesthesia Post Note  Patient: Joy Tapia  Procedure(s) Performed: IRRIGATION AND DEBRIDEMENT ABSCESS (Left: Foot)     Patient location during evaluation: PACU Anesthesia Type: MAC Level of consciousness: awake and alert Pain management: pain level controlled Vital Signs Assessment: post-procedure vital signs reviewed and stable Respiratory status: spontaneous breathing, nonlabored ventilation, respiratory function stable and patient connected to nasal cannula oxygen Cardiovascular status: stable and blood pressure returned to baseline Postop Assessment: no apparent nausea or vomiting Anesthetic complications: no   No notable events documented.  Last Vitals:  Vitals:   10/06/23 0457 10/06/23 0821  BP: (!) 136/44 (!) 167/61  Pulse: 75 93  Resp: 16 18  Temp: 36.4 C (!) 36.4 C  SpO2: 97% 100%    Last Pain:  Vitals:   10/06/23 0821  TempSrc: Oral  PainSc: 0-No pain                 Elayna Tobler S

## 2023-10-06 NOTE — Progress Notes (Signed)
  Subjective:  Patient ID: Joy Tapia, female    DOB: 08-30-1940,  MRN: [id number]  No chief complaint on file.   DOS: 10/03/23 Procedure: 1.  Incision and drainage of abscess and debridement of ulceration first webspace, left foot 2.  Bone biopsy open superficial proximal phalanx second toe left foot  83 y.o. female seen for post op check.  Patient reports she is doing well denies pain in her left foot.  Discussed findings from surgery as well as plans moving forward for ongoing wound care  Review of Systems: Negative except as noted in the HPI. Denies N/V/F/Ch.   Objective:   Constitutional Well developed. Well nourished.  Vascular Foot warm and well perfused. Capillary refill normal to all digits.   No calf pain with palpation  Neurologic Normal speech. Oriented to person, place, and time. Epicritic sensation absent to left forefoot  Dermatologic Open wound present in between 1st and 2nd toe that probes deep to muscle and bone.  Decreased maceration from prior wound bed does appear relatively healthy with no additional purulence noted.  Decreased erythema of the foot and leg.      Orthopedic: Status post I&D first webspace left foot.  Prior right foot TMA well-healed   Radiographs: Deferred postop soft tissue procedure  Pathology: Pending  Micro: Enterococcus faecalis and lactobacillus species,  yeast  Assessment:   1. Cellulitis of left lower extremity   2. Infected wound   3. Hyperglycemia   4. AKI (acute kidney injury) (HCC)     Plan:  Patient was evaluated and treated and all questions answered.  POD # 3 s/p left foot I&D with bone biopsy second toe -Progressing as expected postoperatively, resolving cellulitis of the left lower extremity -Wound appears healthy without additional purulence at this time.  No further surgical plans currently -Follow-up pathology from second toe if positive we will have to decide if second toe amputation warranted now  versus ABX therapy and wound care -Begin daily dressing change with dry 1/4 iodoform packing dressing packing change to left foot cover with 4x4 gauze Kerlix roll Ace wrap -XR: None -WB Status: Weightbearing as tolerated in postop shoe to left foot -Sutures: None. -Medications/ABX: Broad-spectrum antibiotics, ID consulted awaiting final culture data and path data for final ABX -Dressing: Daily dressing changes beginning tomorrow as above - F/u Plan: Will continue to follow        Maridee Shoemaker, DPM [facility] / [facility]

## 2023-10-06 NOTE — TOC Progression Note (Signed)
 Transition of Care [facility]) - Progression Note    Patient Details  Name: Joy Tapia MRN: [id number] Date of Birth: 1941-02-15  Transition of Care [facility]) CM/SW Contact  Jeani Mill, RN Phone Number: 10/06/2023, 3:52 PM  Clinical Narrative:    Spoke with Shelagh Derrick [phone] at [facility] in regards to patient returning to her ALF. Due to patient's confusion and need for sitter Shelagh Derrick  will need to contact her manger and return call with decision.     Expected Discharge Plan: Assisted Living ([facility]) Barriers to Discharge: Continued Medical Work up  Expected Discharge Plan and Services In-house Referral: Clinical Social Work     Living arrangements for the past 2 months: Assisted Living Facility                                       Social Determinants of Health (SDOH) Interventions SDOH Screenings   Food Insecurity: No Food Insecurity (10/01/2023)  Housing: Low Risk  (10/01/2023)  Transportation Needs: No Transportation Needs (10/01/2023)  Utilities: Not At Risk (10/01/2023)  Social Connections: Moderately Isolated (10/01/2023)  Tobacco Use: Medium Risk (10/03/2023)    Readmission Risk Interventions    04/28/2023    3:08 PM  Readmission Risk Prevention Plan  Transportation Screening Complete  PCP or Specialist Appt within 5-7 Days Complete  Home Care Screening Complete  Medication Review (RN CM) Complete

## 2023-10-06 NOTE — Progress Notes (Signed)
 PROGRESS NOTE  Joy Tapia:[id number] DOB: April 04, 1941 DOA: 10/01/2023 PCP: [facility]   LOS: 5 days   Brief Narrative / Interim history: 83 year old female with history of DM 2, CKD 3B, HTN, HLD, osteomyelitis of the right foot status post TMA, mild dementia who comes into the hospital from ALF for worsening left foot infection.  She resides at [facility], and has been having left foot wound that has not been healing.  She was found to have a white count of 15.5 in the ER, and an MRI did not show any acute osteomyelitis but it was not a good exam and it was motion degraded.  Podiatry was consulted, she was admitted to the hospital and given antibiotics  Subjective / 24h Interval events: Intermittent hospital induced delirium present.  Sitter is at bedside.  She has no complaints  Assesement and Plan: Principal Problem:   Cellulitis of left foot Active Problems:   Type 2 diabetes mellitus with hyperglycemia, with long-term current use of insulin  (HCC)   Acute kidney injury superimposed on chronic kidney disease (HCC)   Hypertension associated with diabetes (HCC)   Hyperlipidemia associated with type 2 diabetes mellitus (HCC)   Abscess of left foot  Principal problem Diabetic wound/cellulitis of the left foot -worsening wound with associated cellulitis and leukocytosis.  MRI limited due to motion degradation but no acute osteomyelitis seen.  Patient was started on antibiotics - ABIs pending, but January 2025 ABIs were fairly unremarkable. - Podiatry consulted, underwent debridement in the OR on 5/9 - Cultures with gram-positive rods, gram-positive cocci as well as yeast with pseudohyphae.  ID consulted, currently on Unasyn , Vanco and micafungin.  Awaiting cultures to finalize and final ID recommendations  Active problems Acute kidney injury on CKD stage IIIb-creatinine 1.8 on admission, baseline 1.4-1.5.  Received fluids, creatinine has stabilized and  currently is at baseline  Essential hypertension-continue Toprol , blood pressure acceptable  Hyperlipidemia-continue statin  Mild dementia-delirium precautions, increased confusion overnight, continue low-dose Seroquel, as needed Haldol, sitter  History of gout-holding febuxostat   DM2, insulin -dependent, poorly controlled, with hyperglycemia -patient severely hyperglycemic on admission.  She tells me she is not checking her sugars regularly at home.  Overall CBGs better, increase regimen again due to persistent hyperglycemia  Lab Results  Component Value Date   HGBA1C 9.2 (H) 06/13/2023   CBG (last 3)  Recent Labs    10/05/23 1559 10/05/23 2108 10/06/23 0824  GLUCAP 232* 255* 267*    Scheduled Meds:  amLODipine   5 mg Oral Daily   feeding supplement (GLUCERNA SHAKE)  237 mL Oral BID BM   heparin   5,000 Units Subcutaneous Q8H   insulin  aspart  0-5 Units Subcutaneous QHS   insulin  aspart  0-9 Units Subcutaneous TID WC   insulin  aspart  8 Units Subcutaneous TID WC   insulin  glargine-yfgn  16 Units Subcutaneous QHS   metoprolol  succinate  50 mg Oral q morning   multivitamin  1 tablet Oral QHS   pantoprazole   40 mg Oral Daily   pravastatin   20 mg Oral QHS   QUEtiapine  12.5 mg Oral QHS   Continuous Infusions:  ampicillin -sulbactam (UNASYN ) IV Stopped (10/06/23 0904)   PRN Meds:.acetaminophen  **OR** acetaminophen , fluticasone , haloperidol lactate, hydrALAZINE , loperamide , naLOXone  (NARCAN )  injection, ondansetron  **OR** ondansetron  (ZOFRAN ) IV, oxyCODONE , senna-docusate  Current Outpatient Medications  Medication Instructions   acetaminophen  (TYLENOL ) 650 mg, Every 8 hours PRN   albuterol  (VENTOLIN  HFA) 108 (90 Base) MCG/ACT inhaler 2 puffs, Every 4 hours PRN  aspirin  EC 81 mg, Every morning   cadexomer iodine  (IODOSORB) 0.9 % gel 1 Application, Topical, Daily PRN   cetirizine (ZYRTEC) 10 mg, Every morning   Febuxostat  80 mg, Every morning   fluticasone  (FLONASE ) 50  MCG/ACT nasal spray 1 spray, Daily PRN   furosemide  (LASIX ) 40 mg, Oral, Daily   insulin  detemir (LEVEMIR ) 6 Units, Subcutaneous, Daily   Insulin  Syringe-Needle U-100 (INSULIN  SYRINGE .5CC/28G) 28G X 1/2" 0.5 ML MISC 1 Application, Does not apply, Daily   liver oil-zinc  oxide (DESITIN) 40 % ointment Topical, Every 6 hours PRN   Metoprolol  Succinate (KAPSPARGO  SPRINKLE) 50 MG CS24 1 capsule, Oral, Every morning   metoprolol  succinate (TOPROL -XL) 50 mg, Every morning   Multiple Vitamin (MULTIVITAMIN) capsule 1 capsule, Every morning   mupirocin  ointment (BACTROBAN ) 2 % Topical, Every other day   omeprazole (PRILOSEC) 20 mg, Every morning   oxyCODONE  (OXY IR/ROXICODONE ) 5 mg, Oral, Every 6 hours PRN   oxymetazoline (AFRIN) 0.05 % nasal spray 1 spray, 2 times daily PRN   pravastatin  (PRAVACHOL ) 20 mg, Daily at bedtime   vitamin B-12 (CYANOCOBALAMIN) 500 mcg, Oral, Every morning    Diet Orders (From admission, onward)     Start     Ordered   10/03/23 1142  Diet Carb Modified Fluid consistency: Thin; Room service appropriate? Yes  Diet effective now       Question Answer Comment  Diet-HS Snack? Nothing   Calorie Level Medium 1600-2000   Fluid consistency: Thin   Room service appropriate? Yes      10/03/23 1142            DVT prophylaxis: heparin  injection 5,000 Units Start: 10/01/23 2200   Lab Results  Component Value Date   PLT 266 10/05/2023      Code Status: Full Code  Family Communication: no family at bedside   Status is: Inpatient Remains inpatient appropriate because: severity of illness  Level of care: Med-Surg  Consultants:  Podiatry  Objective: Vitals:   10/05/23 1950 10/05/23 2300 10/06/23 0457 10/06/23 0821  BP: (!) 174/69 (!) 145/59 (!) 136/44 (!) 167/61  Pulse: 79 88 75 93  Resp: 20  16 18   Temp: 98.3 F (36.8 C)  97.6 F (36.4 C) (!) 97.5 F (36.4 C)  TempSrc: Oral  Axillary Oral  SpO2: 100%  97% 100%  Weight:   55.3 kg   Height:         Intake/Output Summary (Last 24 hours) at 10/06/2023 0957 Last data filed at 10/06/2023 3086 Gross per 24 hour  Intake 1322 ml  Output --  Net 1322 ml   Wt Readings from Last 3 Encounters:  10/06/23 55.3 kg  08/14/23 56.5 kg  08/04/23 56.5 kg    Examination: Constitutional: NAD Eyes: lids and conjunctivae normal, no scleral icterus ENMT: mmm Neck: normal, supple Respiratory: clear to auscultation bilaterally, no wheezing, no crackles. Normal respiratory effort.  Cardiovascular: Regular rate and rhythm, no murmurs / rubs / gallops. No LE edema. Abdomen: soft, no distention, no tenderness. Bowel sounds positive.    Data Reviewed: I have independently reviewed following labs and imaging studies   CBC Recent Labs  Lab 10/01/23 1530 10/02/23 0615 10/03/23 0100 10/04/23 0606 10/05/23 0435  WBC 15.5* 15.8* 16.2* 13.4* 13.1*  HGB 15.1* 12.4 10.9* 10.7* 10.8*  HCT 46.0 38.5 33.8* 33.1* 34.5*  PLT 205 200 294 308 266  MCV 84.9 85.6 84.3 83.6 85.8  MCH 27.9 27.6 27.2 27.0 26.9  MCHC 32.8 32.2  32.2 32.3 31.3  RDW 15.8* 15.8* 15.4 15.6* 15.8*  LYMPHSABS 1.3  --   --   --   --   MONOABS 1.1*  --   --   --   --   EOSABS 0.0  --   --   --   --   BASOSABS 0.1  --   --   --   --     Recent Labs  Lab 10/01/23 1530 10/01/23 1609 10/01/23 1758 10/03/23 0100 10/04/23 0606 10/05/23 0435  NA 126*  --   --  132* 139 139  K 4.4  --   --  3.6 3.1* 3.6  CL 86*  --   --  97* 103 109  CO2 23  --   --  24 23 21*  GLUCOSE 553*  --   --  226* 221* 128*  BUN 23  --   --  18 11 11   CREATININE 1.85*  --   --  1.58* 1.31* 1.21*  CALCIUM  8.9  --   --  8.2* 8.2* 8.1*  AST 28  --   --   --  34  --   ALT 21  --   --   --  18  --   ALKPHOS 109  --   --   --  99  --   BILITOT 0.9  --   --   --  0.5  --   ALBUMIN 3.0*  --   --   --  2.2*  --   MG  --   --   --  1.7 1.7 2.2  LATICACIDVEN  --  3.0* 1.9  --   --   --   INR 1.2  --   --   --   --   --      ------------------------------------------------------------------------------------------------------------------ No results for input(s): "CHOL", "HDL", "LDLCALC", "TRIG", "CHOLHDL", "LDLDIRECT" in the last 72 hours.  Lab Results  Component Value Date   HGBA1C 9.2 (H) 06/13/2023   ------------------------------------------------------------------------------------------------------------------ No results for input(s): "TSH", "T4TOTAL", "T3FREE", "THYROIDAB" in the last 72 hours.  Invalid input(s): "FREET3"  Cardiac Enzymes No results for input(s): "CKMB", "TROPONINI", "MYOGLOBIN" in the last 168 hours.  Invalid input(s): "CK" ------------------------------------------------------------------------------------------------------------------ No results found for: "BNP"  CBG: Recent Labs  Lab 10/05/23 0734 10/05/23 1118 10/05/23 1559 10/05/23 2108 10/06/23 0824  GLUCAP 129* 234* 232* 255* 267*    Recent Results (from the past 240 hours)  Blood Culture (routine x 2)     Status: None   Collection Time: 10/01/23  3:20 PM   Specimen: BLOOD  Result Value Ref Range Status   Specimen Description BLOOD RIGHT ANTECUBITAL  Final   Special Requests   Final    BOTTLES DRAWN AEROBIC AND ANAEROBIC Blood Culture results may not be optimal due to an inadequate volume of blood received in culture bottles   Culture   Final    NO GROWTH 5 DAYS Performed at [facility], [address] [place], [place] [address]    Report Status 10/06/2023 FINAL  Final  Blood Culture (routine x 2)     Status: None   Collection Time: 10/01/23  9:10 PM   Specimen: BLOOD LEFT HAND  Result Value Ref Range Status   Specimen Description BLOOD LEFT HAND  Final   Special Requests   Final    BOTTLES DRAWN AEROBIC AND ANAEROBIC Blood Culture results may not be optimal due to an inadequate volume of  blood received in culture bottles   Culture   Final    NO GROWTH 5 DAYS Performed at [facility], [address], [place], [place] [address]    Report Status 10/06/2023 FINAL  Final  Aerobic/Anaerobic Culture w Gram Stain (surgical/deep wound)     Status: None (Preliminary result)   Collection Time: 10/03/23 11:19 AM   Specimen: Soft Tissue, Other  Result Value Ref Range Status   Specimen Description TISSUE  Final   Special Requests LEFT FOOT  Final   Gram Stain   Final    RARE WBC PRESENT,BOTH PMN AND MONONUCLEAR FEW GRAM POSITIVE RODS RARE GRAM POSITIVE COCCI IN PAIRS Performed at [facility], [address] [place], [place] [address]    Culture   Final    RARE ENTEROCOCCUS FAECALIS MODERATE LACTOBACILLUS SPECIES Standardized susceptibility testing for this organism is not available. CULTURE REINCUBATED FOR BETTER GROWTH NO ANAEROBES ISOLATED; CULTURE IN PROGRESS FOR 5 DAYS    Report Status PENDING  Incomplete   Organism ID, Bacteria ENTEROCOCCUS FAECALIS  Final      Susceptibility   Enterococcus faecalis - MIC*    AMPICILLIN  <=2 SENSITIVE Sensitive     VANCOMYCIN  1 SENSITIVE Sensitive     GENTAMICIN  SYNERGY SENSITIVE Sensitive     * RARE ENTEROCOCCUS FAECALIS  Aerobic/Anaerobic Culture w Gram Stain (surgical/deep wound)     Status: None (Preliminary result)   Collection Time: 10/03/23 11:25 AM   Specimen: Soft Tissue, Other  Result Value Ref Range Status   Specimen Description TISSUE  Final   Special Requests LEFT FOOT  Final   Gram Stain   Final    FEW WBC PRESENT, PREDOMINANTLY PMN RARE GRAM POSITIVE RODS RARE YEAST WITH PSEUDOHYPHAE Performed at [facility], [address], [place], [place] [address]    Culture   Final    CULTURE REINCUBATED FOR BETTER GROWTH NO ANAEROBES ISOLATED; CULTURE IN PROGRESS FOR 5 DAYS    Report Status PENDING  Incomplete     Radiology Studies: No results found.    Kathlen Para, MD, PhD [facility]  Between 7 am - 7 pm I am available, please contact me via Amion (for emergencies) or  Securechat (non urgent messages)  Between 7 pm - 7 am I am not available, please contact night coverage MD/APP via Amion

## 2023-10-06 NOTE — Progress Notes (Signed)
 Pt resting this am. BP decreased to 136/44. No complaints voiced at present. Sitter in the room. Call light in reach. Sr x3 elevated. Bed in low position. Bed alarm activated.

## 2023-10-06 NOTE — Progress Notes (Signed)
 Physical Therapy Treatment Patient Details Name: Joy Tapia MRN: [id number] DOB: 20-Oct-1940 Today's Date: 10/06/2023   History of Present Illness 83 y.o. female who presented to the ED 10/01/23 from ALF for evaluation of worsening left foot infection. Podiatry consult 5/8 with plans for I&D 5/9. PMH significant for T2DM, CKD stage IIIb, HTN, HLD, gout, osteomyelitis of right foot s/p TMA, mild dementia    PT Comments  Pt received in recliner, sitter present. Noted increased swelling LLE and encouraged pt to elevate LLE periodically, ended session in this position but she is somewhat resistant as it bothers her back. Pt ambulated in room and hallway with supervision with RW. Pt's STM deficits and decreased attention to environment make her continued high fall risk. Ambulated 200' with no increased drainage LLE. Pt tolerated sitting and standing balance activities with and without UE support. Recommend HHPT at ALF. PT will continue to follow.     If plan is discharge home, recommend the following: A little help with walking and/or transfers;A little help with bathing/dressing/bathroom;Direct supervision/assist for medications management;Direct supervision/assist for financial management;Supervision due to cognitive status   Can travel by private vehicle        Equipment Recommendations  None recommended by PT    Recommendations for Other Services       Precautions / Restrictions Precautions Precautions: Fall Recall of Precautions/Restrictions: Impaired Precaution/Restrictions Comments: has Comptroller Required Braces or Orthoses: Other Brace Other Brace: bil post-op shoes Restrictions Weight Bearing Restrictions Per Provider Order: No (as tolerated)     Mobility  Bed Mobility               General bed mobility comments: pt received in chair    Transfers Overall transfer level: Needs assistance Equipment used: Rolling walker (2 wheels) Transfers: Sit to/from Stand Sit  to Stand: Supervision           General transfer comment: supervision for stand from recliner and toilet. Did well enough that was able to supervise sit>stand from recliner 3' away while working with pt on exercises    Ambulation/Gait Ambulation/Gait assistance: Supervision Gait Distance (Feet): 200 Feet Assistive device: Rolling walker (2 wheels) Gait Pattern/deviations: Decreased step length - left, Decreased step length - right, Decreased stride length, Step-through pattern Gait velocity: decreased Gait velocity interpretation: 1.31 - 2.62 ft/sec, indicative of limited community ambulator Pre-gait activities: worked on single UE support dynamic balance prior to ambulation General Gait Details: leaves RW at times for short distances in room, for ex from bed to sink. vc's to keep RW with her   Stairs             Wheelchair Mobility     Tilt Bed    Modified Rankin (Stroke Patients Only)       Balance Overall balance assessment: Needs assistance Sitting-balance support: No upper extremity supported, Feet supported Sitting balance-Leahy Scale: Good Sitting balance - Comments: sitting balance activities, reaching out of BOS, no LOB   Standing balance support: Bilateral upper extremity supported, During functional activity, Reliant on assistive device for balance Standing balance-Leahy Scale: Fair Standing balance comment: can maintain static stance without support but needs at least unilateral support during dynamic activity               High Level Balance Comments: worked on standing reaching, reaching to floor, catching and tossing.            Communication Communication Communication: No apparent difficulties  Cognition Arousal: Alert Behavior During Therapy:  [facility]  for tasks assessed/performed   PT - Cognitive impairments: History of cognitive impairments, Awareness                       PT - Cognition Comments: STM deficits and decreased  safety awareness make her continue to have high fall risk Following commands: Intact      Cueing Cueing Techniques: Verbal cues  Exercises      General Comments General comments (skin integrity, edema, etc.): pt left in recliner with LE's elevated, LLE with increased swelling. LE's up is uncomfortable on pt's back, instructed her to try to maintain at last 15 mins before putting feet back down. Did not note any increased discharge from L foot with ambulation today      Pertinent Vitals/Pain Pain Assessment Pain Assessment: No/denies pain    Home Living                          Prior Function            PT Goals (current goals can now be found in the care plan section) Acute Rehab PT Goals Patient Stated Goal: use knee scooter after foot surgery PT Goal Formulation: With patient Time For Goal Achievement: 10/16/23 Potential to Achieve Goals: Good Progress towards PT goals: Progressing toward goals    Frequency    Min 3X/week      PT Plan      Co-evaluation              AM-PAC PT "6 Clicks" Mobility   Outcome Measure  Help needed turning from your back to your side while in a flat bed without using bedrails?: None Help needed moving from lying on your back to sitting on the side of a flat bed without using bedrails?: None Help needed moving to and from a bed to a chair (including a wheelchair)?: A Little Help needed standing up from a chair using your arms (e.g., wheelchair or bedside chair)?: A Little Help needed to walk in hospital room?: A Little Help needed climbing 3-5 steps with a railing? : A Little 6 Click Score: 20    End of Session Equipment Utilized During Treatment: Gait belt (BLE post op shoes) Activity Tolerance: Patient tolerated treatment well Patient left: with call bell/phone within reach;in chair;with nursing/sitter in room Nurse Communication: Mobility status;Other (comment) (feet elevated) PT Visit Diagnosis: Other  abnormalities of gait and mobility (R26.89);Muscle weakness (generalized) (M62.81)     Time: 1308-6578 PT Time Calculation (min) (ACUTE ONLY): 33 min  Charges:    $Gait Training: 8-22 mins $Therapeutic Activity: 8-22 mins PT General Charges $$ ACUTE PT VISIT: 1 Visit                     Amey Ka, PT  Acute Rehab Services Secure chat preferred Office [phone]    Deloris Fetters Selisa Tensley 10/06/2023, 11:36 AM

## 2023-10-06 NOTE — Progress Notes (Signed)
   Subjective: 3 Days Post-Op Procedure(s) (LRB): IRRIGATION AND DEBRIDEMENT ABSCESS (Left) DOS: 10/03/2023  Objective: Vital signs in last 24 hours: Temp:  [97.6 F (36.4 C)-98.8 F (37.1 C)] 97.6 F (36.4 C) (05/12 0457) Pulse Rate:  [75-88] 75 (05/12 0457) Resp:  [16-20] 16 (05/12 0457) BP: (136-174)/(44-69) 136/44 (05/12 0457) SpO2:  [97 %-100 %] 97 % (05/12 0457) Weight:  [55.3 kg] 55.3 kg (05/12 0457)  Recent Labs    10/04/23 0606 10/05/23 0435  HGB 10.7* 10.8*   Recent Labs    10/04/23 0606 10/05/23 0435  WBC 13.4* 13.1*  RBC 3.96 4.02  HCT 33.1* 34.5*  PLT 308 266   Recent Labs    10/04/23 0606 10/05/23 0435  NA 139 139  K 3.1* 3.6  CL 103 109  CO2 23 21*  BUN 11 11  CREATININE 1.31* 1.21*  GLUCOSE 221* 128*  CALCIUM  8.2* 8.1*   No results for input(s): "LABPT", "INR" in the last 72 hours.  Physical Exam: Maceration noted to the amputation site.  No malodor.  Serous drainage.  Continues to be some r distant moderate erythema around the foot.  No active purulence.  Extensive fibrotic and necrotic tissue within the amputation site also noted  Component Ref Range & Units (hover) 3 d ago  Specimen Description TISSUE  Special Requests LEFT FOOT  Gram Stain FEW WBC PRESENT, PREDOMINANTLY PMN RARE GRAM POSITIVE RODS RARE YEAST WITH PSEUDOHYPHAE  Culture CULTURE REINCUBATED FOR BETTER GROWTH HOLDING FOR POSSIBLE ANAEROBE Performed at [facility], [address] [place], [place] [address]  Report Status PENDING   Assessment/Plan: 3 Days Post-Op Procedure(s) (LRB): IRRIGATION AND DEBRIDEMENT ABSCESS (Left) DOS: 10/03/2023.  Dr. Barry Boring D.P.M.  -Patient seen at bedside.  Dressings changed -Betadine gauze was packed within the amputation site. -Recommend daily dressing changes. -Continue ABX as per ID.  Always appreciated.  Cultures pending -Will follow   Joy Tapia 10/06/2023, 7:44 AM  Joy Tapia, DPM [facility]  Dr. Dot Tapia, DPM    [address] [place], [place] [address]                Office [phone]  Fax [phone]

## 2023-10-06 NOTE — Progress Notes (Signed)
 [facility]  Date of Admission:  10/01/2023      Total days of antibiotics 5   Unasyn , Vancomycin , Micafungin          ASSESSMENT: Joy Tapia is a 83 y.o. female admitted for:   Abscess, Left Foot -  S/P I&D 5/9 of Ulceration first webspace - Enterococcus Faecalis, Lactobacillus, Candida Albicans -  Bone Biopsy (-) Osteomyelitis, Reactive Changes and Soft Tissue Necrosis -  Reviewed Dr. Rosemarie Conquest note and she is doing well post op. Wound looks good. Bone biopsy result back w/o any acute osteomyelitis.  Her cultures updated today - will stop vancomycin . Continue Unasyn  and change Micafungin to Fluconazole 200 mg daily PO.  We can probably convert to oral Augmentin 875 mg BID tomorrow for discharge as long as podiatry satisfied with progress.   Medication Monitoring -  QTc 423 ms on 5/07   Chronic Diarrhea -  "No worse no better". No abdominal pain, good appetite.  -Imodium  PRN     PLAN: Stop Vancomycin  / Micafungin Start fluconazole 200 mg daily  Start  Augmentin 875 mg BID  NO PICC line planned  End of antibiotics May 23rd  Will FU with cultures again tomorrow to ensure final   She can follow up with podiatry and we are happy to see her back if any changes arise that we can help with.    Principal Problem:   Cellulitis of left foot Active Problems:   Hyperlipidemia associated with type 2 diabetes mellitus (HCC)   Hypertension associated with diabetes (HCC)   Type 2 diabetes mellitus with hyperglycemia, with long-term current use of insulin  (HCC)   Acute kidney injury superimposed on chronic kidney disease (HCC)   Abscess of left foot    amLODipine   5 mg Oral Daily   feeding supplement (GLUCERNA SHAKE)  237 mL Oral BID BM   fluconazole  200 mg Oral Daily   heparin   5,000 Units Subcutaneous Q8H   insulin  aspart  0-5 Units Subcutaneous QHS   insulin  aspart  0-9 Units Subcutaneous TID WC   insulin  aspart  10 Units  Subcutaneous TID WC   insulin  glargine-yfgn  18 Units Subcutaneous QHS   metoprolol  succinate  50 mg Oral q morning   multivitamin  1 tablet Oral QHS   pantoprazole   40 mg Oral Daily   pravastatin   20 mg Oral QHS   QUEtiapine  12.5 mg Oral QHS    SUBJECTIVE: Doing OK. Has a sitter at the bedside. Attempted to leave multiple times last night. H/O Dementia. Lives in AL facility  She is very pleasant during out conversation.   Review of Systems: ROS  Allergies  Allergen Reactions   Atorvastatin Diarrhea    Allergy not listed on MAR    Azithromycin Other (See Comments)    Pt states burns her insides. Allergy not listed on MAR     Erythromycin Other (See Comments)    Unknown. Allergy not listed on MAR    Other Other (See Comments)    Adhesive bandage, Anesthesia Extension set  -  unknown. Allergy not listed on MAR      Propoxyphene Other (See Comments)    Allergy not listed on MAR      OBJECTIVE: Vitals:   10/05/23 1950 10/05/23 2300 10/06/23 0457 10/06/23 0821  BP: (!) 174/69 (!) 145/59 (!) 136/44 (!) 167/61  Pulse: 79 88 75 93  Resp: 20  16 18  Temp: 98.3 F (36.8 C)  97.6 F (36.4 C) (!) 97.5 F (36.4 C)  TempSrc: Oral  Axillary Oral  SpO2: 100%  97% 100%  Weight:   55.3 kg   Height:       Body mass index is 22.3 kg/m.  Physical Exam Constitutional:      Appearance: Normal appearance. She is not ill-appearing.  HENT:     Mouth/Throat:     Mouth: Mucous membranes are moist.     Pharynx: Oropharynx is clear.  Eyes:     General: No scleral icterus. Pulmonary:     Effort: Pulmonary effort is normal.  Musculoskeletal:     Comments: Lt foot wrapped in clean gauze dressing  Neurological:     Mental Status: She is oriented to person, place, and time.  Psychiatric:        Mood and Affect: Mood normal.        Thought Content: Thought content normal.     Lab Results Lab Results  Component Value Date   WBC 13.1 (H) 10/05/2023   HGB 10.8 (L) 10/05/2023    HCT 34.5 (L) 10/05/2023   MCV 85.8 10/05/2023   PLT 266 10/05/2023    Lab Results  Component Value Date   CREATININE 1.21 (H) 10/05/2023   BUN 11 10/05/2023   NA 139 10/05/2023   K 3.6 10/05/2023   CL 109 10/05/2023   CO2 21 (L) 10/05/2023    Lab Results  Component Value Date   ALT 18 10/04/2023   AST 34 10/04/2023   ALKPHOS 99 10/04/2023   BILITOT 0.5 10/04/2023     Microbiology: Recent Results (from the past 240 hours)  Blood Culture (routine x 2)     Status: None   Collection Time: 10/01/23  3:20 PM   Specimen: BLOOD  Result Value Ref Range Status   Specimen Description BLOOD RIGHT ANTECUBITAL  Final   Special Requests   Final    BOTTLES DRAWN AEROBIC AND ANAEROBIC Blood Culture results may not be optimal due to an inadequate volume of blood received in culture bottles   Culture   Final    NO GROWTH 5 DAYS Performed at [facility], [address], [place], [place] [address]    Report Status 10/06/2023 FINAL  Final  Blood Culture (routine x 2)     Status: None   Collection Time: 10/01/23  9:10 PM   Specimen: BLOOD LEFT HAND  Result Value Ref Range Status   Specimen Description BLOOD LEFT HAND  Final   Special Requests   Final    BOTTLES DRAWN AEROBIC AND ANAEROBIC Blood Culture results may not be optimal due to an inadequate volume of blood received in culture bottles   Culture   Final    NO GROWTH 5 DAYS Performed at [facility], [address], [place], [place] [address]    Report Status 10/06/2023 FINAL  Final  Aerobic/Anaerobic Culture w Gram Stain (surgical/deep wound)     Status: None (Preliminary result)   Collection Time: 10/03/23 11:19 AM   Specimen: Soft Tissue, Other  Result Value Ref Range Status   Specimen Description TISSUE  Final   Special Requests LEFT FOOT  Final   Gram Stain   Final    RARE WBC PRESENT,BOTH PMN AND MONONUCLEAR FEW GRAM POSITIVE RODS RARE GRAM POSITIVE COCCI IN PAIRS Performed at [facility],  [address] [place], [place] [address]    Culture   Final  RARE ENTEROCOCCUS FAECALIS MODERATE LACTOBACILLUS SPECIES Standardized susceptibility testing for this organism is not available. FEW CANDIDA ALBICANS NO ANAEROBES ISOLATED; CULTURE IN PROGRESS FOR 5 DAYS    Report Status PENDING  Incomplete   Organism ID, Bacteria ENTEROCOCCUS FAECALIS  Final      Susceptibility   Enterococcus faecalis - MIC*    AMPICILLIN  <=2 SENSITIVE Sensitive     VANCOMYCIN  1 SENSITIVE Sensitive     GENTAMICIN  SYNERGY SENSITIVE Sensitive     * RARE ENTEROCOCCUS FAECALIS  Aerobic/Anaerobic Culture w Gram Stain (surgical/deep wound)     Status: None (Preliminary result)   Collection Time: 10/03/23 11:25 AM   Specimen: Soft Tissue, Other  Result Value Ref Range Status   Specimen Description TISSUE  Final   Special Requests LEFT FOOT  Final   Gram Stain   Final    FEW WBC PRESENT, PREDOMINANTLY PMN RARE GRAM POSITIVE RODS RARE YEAST WITH PSEUDOHYPHAE Performed at [facility], [address] [place], [place] [address]    Culture   Final    RARE ENTEROCOCCUS FAECALIS FEW CANDIDA ALBICANS SUSCEPTIBILITIES PERFORMED ON PREVIOUS CULTURE WITHIN THE LAST 5 DAYS. NO ANAEROBES ISOLATED; CULTURE IN PROGRESS FOR 5 DAYS    Report Status PENDING  Incomplete     Gibson Kurtz, MSN, NP-C [facility]  [email or web address] Pager: [phone] Office: [phone] [facility] Main Line: [phone] *Secure Chat Communication Welcome

## 2023-10-06 NOTE — Plan of Care (Signed)

## 2023-10-07 ENCOUNTER — Encounter: Admitting: Podiatry

## 2023-10-07 LAB — GLUCOSE, CAPILLARY
Glucose-Capillary: 107 mg/dL — ABNORMAL HIGH (ref 70–99)
Glucose-Capillary: 138 mg/dL — ABNORMAL HIGH (ref 70–99)
Glucose-Capillary: 208 mg/dL — ABNORMAL HIGH (ref 70–99)
Glucose-Capillary: 113 mg/dL — ABNORMAL HIGH (ref 70–99)

## 2023-10-07 MED ORDER — OXYCODONE HCL 5 MG PO TABS: 5.0000 mg | ORAL_TABLET | Freq: Four times a day (QID) | ORAL | 0 refills | Status: DC | PRN

## 2023-10-07 MED ORDER — FLUCONAZOLE 200 MG PO TABS: 200.0000 mg | ORAL_TABLET | Freq: Every day | ORAL | 0 refills | Status: AC

## 2023-10-07 MED ORDER — INSULIN ASPART 100 UNIT/ML IJ SOLN
8.0000 [IU] | Freq: Three times a day (TID) | INTRAMUSCULAR | 11 refills | Status: AC
Start: 2023-10-07 — End: ?

## 2023-10-07 MED ORDER — AMOXICILLIN-POT CLAVULANATE 875-125 MG PO TABS: 1.0000 | ORAL_TABLET | Freq: Two times a day (BID) | ORAL | 0 refills | Status: DC

## 2023-10-07 MED ORDER — AMLODIPINE BESYLATE 5 MG PO TABS: 5.0000 mg | ORAL_TABLET | Freq: Every day | ORAL | 0 refills | Status: AC

## 2023-10-07 MED ORDER — INSULIN GLARGINE-YFGN 100 UNIT/ML ~~LOC~~ SOLN: 16.0000 [IU] | Freq: Every day | SUBCUTANEOUS | 11 refills | Status: DC

## 2023-10-07 NOTE — [place] FL2 (Addendum)
 [place]  MEDICAID FL2 LEVEL OF CARE FORM     IDENTIFICATION  Patient Name: Joy Tapia Birthdate: February 03, 1941 Sex: female Admission Date (Current Location): 10/01/2023  [facility] and [place] Number:  Producer, television/film/video and Address:  [facility] [place], [address] [place], [place] [address]      Provider Number: [id number]  Attending Physician Name and Address:  Osborn Blaze, MD  Relative Name and Phone Number:       Current Level of Care: Hospital Recommended Level of Care: Assisted Living Facility Prior Approval Number:    Date Approved/Denied:   PASRR Number:    Discharge Plan: Other (Comment) (ALF)    Current Diagnoses: Patient Active Problem List   Diagnosis Date Noted   Abscess of left foot 10/02/2023   Cellulitis of left foot 10/01/2023   Type 2 diabetes mellitus with hyperglycemia, with long-term current use of insulin  (HCC) 10/01/2023   Acute kidney injury superimposed on chronic kidney disease (HCC) 10/01/2023   Malnutrition of moderate degree 08/05/2023   Hyperosmolar hyperglycemic state (HHS) (HCC) 08/04/2023   Cellulitis of both lower extremities 08/04/2023   Equinus deformity of right foot 06/16/2023   Ulcer of left foot with fat layer exposed (HCC) 06/16/2023   Diabetic foot infection (HCC) 06/13/2023   Cellulitis 04/26/2023   Fracture of patella 03/05/2023   Hoarding disorder 07/02/2022   CKD (chronic kidney disease) stage 4, GFR 15-29 ml/min (HCC) 06/29/2022   Hypertension associated with diabetes (HCC) 06/29/2022   Osteomyelitis (HCC) 06/28/2022   Diabetes mellitus (HCC) 04/29/2022   Mild cognitive impairment 06/26/2021   Non-compliance 04/11/2019   Health care maintenance 09/15/2014   Gout 12/25/2013   Hyperlipidemia associated with type 2 diabetes mellitus (HCC) 12/25/2013   Hypertension in stage 4 chronic kidney disease due to type 2 diabetes mellitus (HCC) 12/25/2013   Osteoporosis 12/25/2013     Orientation RESPIRATION BLADDER Height & Weight     Situation, Place, Self  Normal Continent Weight: 124 lb 11.2 oz (56.6 kg) Height:  5\' 2"  (157.5 cm)  BEHAVIORAL SYMPTOMS/MOOD NEUROLOGICAL BOWEL NUTRITION STATUS      Continent Diet (regular)  AMBULATORY STATUS COMMUNICATION OF NEEDS Skin   Limited Assist Verbally Other (Comment) (closed incision left foot)                       Personal Care Assistance Level of Assistance  Bathing, Feeding, Dressing Bathing Assistance: Limited assistance Feeding assistance: Independent Dressing Assistance: Limited assistance     Functional Limitations Info  Sight, Hearing, Speech Sight Info: Adequate Hearing Info: Impaired Speech Info: Adequate    SPECIAL CARE FACTORS FREQUENCY  PT (By licensed PT), OT (By licensed OT)     PT Frequency: 3x per week              Contractures Contractures Info: Not present    Additional Factors Info  Code Status, Allergies Code Status Info: FULL Allergies Info: Atorvastatin,Azithromycin,Erythromycin,Propoxyphene           Current Medications (10/07/2023):  This is the current hospital active medication list Current Facility-Administered Medications  Medication Dose Route Frequency Provider Last Rate Last Admin   acetaminophen  (TYLENOL ) tablet 650 mg  650 mg Oral Q6H PRN Patel, Vishal R, MD   650 mg at 10/06/23 0011   Or   acetaminophen  (TYLENOL ) suppository 650 mg  650 mg Rectal Q6H PRN Patel, Vishal R, MD       amLODipine  (NORVASC ) tablet 5  mg  5 mg Oral Daily Gherghe, Costin M, MD   5 mg at 10/07/23 1022   amoxicillin-clavulanate (AUGMENTIN) 875-125 MG per tablet 1 tablet  1 tablet Oral Q12H Liane Redman, MD   1 tablet at 10/07/23 1021   feeding supplement (GLUCERNA SHAKE) (GLUCERNA SHAKE) liquid 237 mL  237 mL Oral BID BM Gherghe, Costin M, MD   237 mL at 10/07/23 1023   fluconazole (DIFLUCAN) tablet 200 mg  200 mg Oral Daily Snider, Dioselina Brumbaugh, MD   200 mg at 10/07/23 1022    fluticasone  (FLONASE ) 50 MCG/ACT nasal spray 1 spray  1 spray Each Nare Daily PRN Rathore, Vasundhra, MD   1 spray at 10/06/23 2200   haloperidol lactate (HALDOL) injection 1 mg  1 mg Intravenous Q6H PRN Gherghe, Costin M, MD   1 mg at 10/05/23 1437   heparin  injection 5,000 Units  5,000 Units Subcutaneous Q8H Patel, Vishal R, MD   5,000 Units at 10/07/23 0502   hydrALAZINE  (APRESOLINE ) injection 5 mg  5 mg Intravenous Q4H PRN Gherghe, Costin M, MD   5 mg at 10/05/23 2129   insulin  aspart (novoLOG ) injection 0-5 Units  0-5 Units Subcutaneous QHS Patel, Vishal R, MD   3 Units at 10/05/23 2134   insulin  aspart (novoLOG ) injection 0-9 Units  0-9 Units Subcutaneous TID WC Patel, Vishal R, MD   1 Units at 10/06/23 1800   insulin  aspart (novoLOG ) injection 10 Units  10 Units Subcutaneous TID WC Gherghe, Costin M, MD   10 Units at 10/07/23 9811   insulin  glargine-yfgn (SEMGLEE ) injection 18 Units  18 Units Subcutaneous QHS Gherghe, Costin M, MD   18 Units at 10/06/23 2125   loperamide  (IMODIUM ) capsule 2 mg  2 mg Oral PRN Gherghe, Costin M, MD   2 mg at 10/05/23 1750   metoprolol  succinate (TOPROL -XL) 24 hr tablet 50 mg  50 mg Oral q morning Patel, Vishal R, MD   50 mg at 10/07/23 1022   multivitamin (RENA-VIT) tablet 1 tablet  1 tablet Oral QHS Gherghe, Costin M, MD   1 tablet at 10/06/23 2124   naloxone  (NARCAN ) injection 0.4 mg  0.4 mg Intravenous PRN Rathore, Vasundhra, MD       ondansetron  (ZOFRAN ) tablet 4 mg  4 mg Oral Q6H PRN Patel, Vishal R, MD       Or   ondansetron  (ZOFRAN ) injection 4 mg  4 mg Intravenous Q6H PRN Patel, Vishal R, MD       oxyCODONE  (Oxy IR/ROXICODONE ) immediate release tablet 5 mg  5 mg Oral Once PRN Rathore, Vasundhra, MD       pantoprazole  (PROTONIX ) EC tablet 40 mg  40 mg Oral Daily Patel, Vishal R, MD   40 mg at 10/07/23 1022   pravastatin  (PRAVACHOL ) tablet 20 mg  20 mg Oral QHS Patel, Vishal R, MD   20 mg at 10/06/23 2124   QUEtiapine (SEROQUEL) tablet 12.5 mg  12.5 mg  Oral QHS Gherghe, Costin M, MD   12.5 mg at 10/06/23 2124   senna-docusate (Senokot-S) tablet 1 tablet  1 tablet Oral QHS PRN Patel, Vishal R, MD         Discharge Medications: acetaminophen  325 MG tablet Commonly known as: TYLENOL  Take 650 mg by mouth every 8 (eight) hours as needed for mild pain (pain score 1-3) or moderate pain (pain score 4-6).    albuterol  108 (90 Base) MCG/ACT inhaler Commonly known as: VENTOLIN  HFA Inhale 2 puffs into the lungs every 4 (  four) hours as needed for wheezing or shortness of breath.    amLODipine  5 MG tablet Commonly known as: NORVASC  Take 1 tablet (5 mg total) by mouth daily.    amoxicillin-clavulanate 875-125 MG tablet Commonly known as: AUGMENTIN Take 1 tablet by mouth every 12 (twelve) hours for 10 days.    aspirin  EC 81 MG tablet Take 81 mg by mouth every morning.    cadexomer iodine  0.9 % gel Commonly known as: IODOSORB Apply 1 Application topically daily as needed for wound care.    cetirizine 10 MG tablet Commonly known as: ZYRTEC Take 10 mg by mouth every morning.    Febuxostat  80 MG Tabs Take 80 mg by mouth every morning.    fluconazole 200 MG tablet Commonly known as: DIFLUCAN Take 1 tablet (200 mg total) by mouth daily for 10 days.    fluticasone  50 MCG/ACT nasal spray Commonly known as: FLONASE  Place 1 spray into both nostrils daily as needed for allergies or rhinitis.    furosemide  40 MG tablet Commonly known as: LASIX  Take 1 tablet (40 mg total) by mouth daily. What changed: when to take this    insulin  aspart 100 UNIT/ML injection Commonly known as: novoLOG  Inject 8 Units into the skin 3 (three) times daily with meals.    insulin  glargine-yfgn 100 UNIT/ML injection Commonly known as: SEMGLEE  Inject 0.16 mLs (16 Units total) into the skin at bedtime.    INSULIN  SYRINGE .5CC/28G 28G X 1/2" 0.5 ML Misc 1 Application by Does not apply route daily.    liver oil-zinc  oxide 40 % ointment Commonly known as:  DESITIN Apply topically every 6 (six) hours as needed for irritation.    metoprolol  succinate 50 MG 24 hr tablet Commonly known as: TOPROL -XL Take 50 mg by mouth every morning. What changed: Another medication with the same name was removed. Continue taking this medication, and follow the directions you see here.    multivitamin capsule Take 1 capsule by mouth every morning.    mupirocin  ointment 2 % Commonly known as: BACTROBAN  Apply topically every other day.    omeprazole 20 MG capsule Commonly known as: PRILOSEC Take 20 mg by mouth every morning.    oxyCODONE  5 MG immediate release tablet Commonly known as: Oxy IR/ROXICODONE  Take 1 tablet (5 mg total) by mouth every 6 (six) hours as needed for moderate pain (pain score 4-6).    oxymetazoline 0.05 % nasal spray Commonly known as: AFRIN Place 1 spray into both nostrils 2 (two) times daily as needed for congestion.    pravastatin  20 MG tablet Commonly known as: PRAVACHOL  Take 20 mg by mouth at bedtime.    vitamin B-12 500 MCG tablet Commonly known as: CYANOCOBALAMIN Take 500 mcg by mouth every morning.           Relevant Imaging Results:  Relevant Lab Results:   Additional Information SSN #  Valery Gaucher, LCSW

## 2023-10-07 NOTE — TOC Transition Note (Signed)
 Transition of Care [facility]) - Discharge Note   Patient Details  Name: Joy Tapia MRN: [id number] Date of Birth: 1940/07/29  Transition of Care [facility]) CM/SW Contact:  Katrinka Parr, LCSW Phone Number: 10/07/2023, 12:17 PM   Clinical Narrative:      Per MD patient ready for DC to [facility]. RN, patient, and facility notified of DC. Discharge Summary and FL2 sent to facility. RN to call report prior to discharge [phone] ). DC packet on chart. Ambulance transport requested for patient.   CSW will sign off for now as social work intervention is no longer needed. Please consult us  again if new needs arise.   Final next level of care: Assisted Living Barriers to Discharge: No Barriers Identified   Patient Goals and CMS Choice Patient states their goals for this hospitalization and ongoing recovery are:: Unable to assess          Discharge Placement                Patient to be transferred to facility by: PTAR      Discharge Plan and Services Additional resources added to the After Visit Summary for   In-house Referral: Clinical Social Work                                   Social Drivers of Health (SDOH) Interventions SDOH Screenings   Food Insecurity: No Food Insecurity (10/01/2023)  Housing: Low Risk  (10/01/2023)  Transportation Needs: No Transportation Needs (10/01/2023)  Utilities: Not At Risk (10/01/2023)  Social Connections: Moderately Isolated (10/01/2023)  Tobacco Use: Medium Risk (10/03/2023)     Readmission Risk Interventions    04/28/2023    3:08 PM  Readmission Risk Prevention Plan  Transportation Screening Complete  PCP or Specialist Appt within 5-7 Days Complete  Home Care Screening Complete  Medication Review (RN CM) Complete

## 2023-10-07 NOTE — TOC Progression Note (Signed)
 Transition of Care [facility]) - Progression Note    Patient Details  Name: TREMIKA BIELAWSKI MRN: [id number] Date of Birth: 1940-09-17  [facility]) CM/SW Contact  Valery Gaucher, [place] Phone Number: 10/07/2023, 2:46 PM  Clinical Narrative:     Received call from [facility], they can admit patient back until ALF has done an assessment. ALF/RN plans to complete assessment tomorrow.   PTAR cancelled.    PTAR cancelled and TOC  Sup/ [facility] was inform, ALF cancelled discharged.  Liddie Reel, MSW, LCSW Clinical Social Worker      Expected Discharge Plan: Assisted Living ([facility]) Barriers to Discharge: No Barriers Identified  Expected Discharge Plan and Services In-house Referral: Clinical Social Work     Living arrangements for the past 2 months: Assisted Living Facility Expected Discharge Date: 10/07/23                                     Social Determinants of Health (SDOH) Interventions SDOH Screenings   Food Insecurity: No Food Insecurity (10/01/2023)  Housing: Low Risk  (10/01/2023)  Transportation Needs: No Transportation Needs (10/01/2023)  Utilities: Not At Risk (10/01/2023)  Social Connections: Moderately Isolated (10/01/2023)  Tobacco Use: Medium Risk (10/03/2023)    Readmission Risk Interventions    04/28/2023    3:08 PM  Readmission Risk Prevention Plan  Transportation Screening Complete  PCP or Specialist Appt within 5-7 Days Complete  Home Care Screening Complete  Medication Review (RN CM) Complete

## 2023-10-07 NOTE — Discharge Summary (Signed)
 Physician Discharge Summary  Joy Tapia AOZ:[id number] DOB: 05-20-1941 DOA: 10/01/2023  PCP: [facility]  Admit date: 10/01/2023 Discharge date: 10/07/2023  Admitted From: ALF Disposition:  ALF  Recommendations for Outpatient Follow-up:  Follow up with PCP in 1-2 weeks Please obtain BMP/CBC in one week Please note the insulin  regimen was changed  Home Health: none Equipment/Devices: none  Discharge Condition: stable CODE STATUS: Full code Diet Orders (From admission, onward)     Start     Ordered   10/03/23 1142  Diet Carb Modified Fluid consistency: Thin; Room service appropriate? Yes  Diet effective now       Question Answer Comment  Diet-HS Snack? Nothing   Calorie Level Medium 1600-2000   Fluid consistency: Thin   Room service appropriate? Yes      10/03/23 1142            HPI: Per admitting MD, Joy Tapia is a 83 y.o. female with medical history significant for T2DM, CKD stage IIIb, HTN, HLD, gout, osteomyelitis of right foot s/p TMA, mild dementia who presented to the ED from ALF for evaluation of worsening left foot infection. Patient resides at [facility].  She has been receiving wound care for management of a wound to her left foot.  It was noted that the plantar wound between the base of her 1st and 2nd toes has become more erythematous and hot to touch with purulent discharge.  Staff at her facility have noted that she has been more fatigued and less interactive than baseline.  She was sent to the ED for further evaluation. Patient reports some pain in her left foot but otherwise denies fevers, chills, diaphoresis.  She denies chest pain or dyspnea.  Hospital Course / Discharge diagnoses: Principal Problem:   Cellulitis of left foot Active Problems:   Type 2 diabetes mellitus with hyperglycemia, with long-term current use of insulin  (HCC)   Acute kidney injury superimposed on chronic kidney disease (HCC)   Hypertension  associated with diabetes (HCC)   Hyperlipidemia associated with type 2 diabetes mellitus (HCC)   Abscess of left foot   Principal problem Diabetic wound/cellulitis of the left foot -worsening wound with associated cellulitis and leukocytosis.  MRI limited due to motion degradation but no acute osteomyelitis seen.  Patient was started on antibiotics, podiatry and ID consulted. She was taken to the OR and underwent debridement on 5/9. Cultures with gram-positive rods, gram-positive cocci as well as yeast with pseudohyphae.  ID consulted and followed patient while hospitalized. Intra op cultures negative, and will be placed on Augmentin and Diflucan with end date 10/17/23   Active problems Acute kidney injury on CKD stage IIIb-creatinine 1.8 on admission, baseline 1.4-1.5.  Received fluids, creatinine has stabilized and currently is at baseline Essential hypertension-continue Toprol , blood pressure acceptable Hyperlipidemia-continue statin Mild dementia-slight increased delirium while hospitalized, now resolved History of gout-continue home medications DM2, insulin -dependent, poorly controlled, with hyperglycemia -patient severely hyperglycemic on admission.  She tells me she is not checking her sugars regularly at home.  Overall CBGs better, insulin  regimen adjusted as below  Lab Results  Component Value Date   HGBA1C 9.2 (H) 06/13/2023   CBG (last 3)  Recent Labs    10/06/23 1552 10/06/23 2013 10/07/23 0805  GLUCAP 130* 192* 107*   Sepsis ruled out   Discharge Instructions   Allergies as of 10/07/2023       Reactions   Atorvastatin Diarrhea   Allergy not listed on [facility]  Azithromycin Other (See Comments)   Pt states burns her insides. Allergy not listed on MAR    Erythromycin Other (See Comments)   Unknown. Allergy not listed on MAR    Other Other (See Comments)   Adhesive bandage, Anesthesia Extension set  -  unknown. Allergy not listed on MAR    Propoxyphene Other (See  Comments)   Allergy not listed on MAR         Medication List     STOP taking these medications    insulin  detemir 100 UNIT/ML injection Commonly known as: LEVEMIR        TAKE these medications    acetaminophen  325 MG tablet Commonly known as: TYLENOL  Take 650 mg by mouth every 8 (eight) hours as needed for mild pain (pain score 1-3) or moderate pain (pain score 4-6).   albuterol  108 (90 Base) MCG/ACT inhaler Commonly known as: VENTOLIN  HFA Inhale 2 puffs into the lungs every 4 (four) hours as needed for wheezing or shortness of breath.   amLODipine  5 MG tablet Commonly known as: NORVASC  Take 1 tablet (5 mg total) by mouth daily.   amoxicillin-clavulanate 875-125 MG tablet Commonly known as: AUGMENTIN Take 1 tablet by mouth every 12 (twelve) hours for 10 days.   aspirin  EC 81 MG tablet Take 81 mg by mouth every morning.   cadexomer iodine  0.9 % gel Commonly known as: IODOSORB Apply 1 Application topically daily as needed for wound care.   cetirizine 10 MG tablet Commonly known as: ZYRTEC Take 10 mg by mouth every morning.   Febuxostat  80 MG Tabs Take 80 mg by mouth every morning.   fluconazole 200 MG tablet Commonly known as: DIFLUCAN Take 1 tablet (200 mg total) by mouth daily for 10 days.   fluticasone  50 MCG/ACT nasal spray Commonly known as: FLONASE  Place 1 spray into both nostrils daily as needed for allergies or rhinitis.   furosemide  40 MG tablet Commonly known as: LASIX  Take 1 tablet (40 mg total) by mouth daily. What changed: when to take this   insulin  aspart 100 UNIT/ML injection Commonly known as: novoLOG  Inject 8 Units into the skin 3 (three) times daily with meals.   insulin  glargine-yfgn 100 UNIT/ML injection Commonly known as: SEMGLEE  Inject 0.16 mLs (16 Units total) into the skin at bedtime.   INSULIN  SYRINGE .5CC/28G 28G X 1/2" 0.5 ML Misc 1 Application by Does not apply route daily.   liver oil-zinc  oxide 40 %  ointment Commonly known as: DESITIN Apply topically every 6 (six) hours as needed for irritation.   metoprolol  succinate 50 MG 24 hr tablet Commonly known as: TOPROL -XL Take 50 mg by mouth every morning. What changed: Another medication with the same name was removed. Continue taking this medication, and follow the directions you see here.   multivitamin capsule Take 1 capsule by mouth every morning.   mupirocin  ointment 2 % Commonly known as: BACTROBAN  Apply topically every other day.   omeprazole 20 MG capsule Commonly known as: PRILOSEC Take 20 mg by mouth every morning.   oxyCODONE  5 MG immediate release tablet Commonly known as: Oxy IR/ROXICODONE  Take 1 tablet (5 mg total) by mouth every 6 (six) hours as needed for moderate pain (pain score 4-6).   oxymetazoline 0.05 % nasal spray Commonly known as: AFRIN Place 1 spray into both nostrils 2 (two) times daily as needed for congestion.   pravastatin  20 MG tablet Commonly known as: PRAVACHOL  Take 20 mg by mouth at bedtime.   vitamin B-12  500 MCG tablet Commonly known as: CYANOCOBALAMIN Take 500 mcg by mouth every morning.       Consultations: Podiatry  ID  Procedures/Studies:  MRI Left foot without contrast Result Date: 10/01/2023 CLINICAL DATA:  Soft tissue infection suspected, foot, xray done EXAM: MRI OF THE LEFT FOOT WITHOUT CONTRAST TECHNIQUE: Multiplanar, multisequence MR imaging of the left foot was performed. No intravenous contrast was administered. COMPARISON:  Same day radiographs of the left foot at 4:18 p.m. FINDINGS: Evaluation is significantly compromised due to motion degradation. Bones/Joint/Cartilage No definite marrow signal abnormality identified to suggest acute osteomyelitis, although evaluation is significantly compromised due to motion degradation. No evidence of acute fracture. No appreciable joint effusion. Ligaments Lisfranc ligament appears intact. Muscles and Tendons Diffuse atrophy of the  intrinsic musculature of the foot, which may reflect chronic denervation changes. No evidence of significant tenosynovitis. Soft tissue Suspected cutaneous irregularity in the first webspace, between the bases of the first and second proximal phalanges, which may represent a wound with surrounding edema. No obvious loculated fluid collection identified. Diffuse subcutaneous edema throughout the midfoot and forefoot. IMPRESSION: Evaluation is significantly compromised due to motion degradation. 1. No definite marrow signal abnormality identified to suggest acute osteomyelitis, although evaluation is significantly compromised due to motion degradation. 2. Suspected cutaneous irregularity in the first webspace, between the bases of the first and second proximal phalanges, which may represent a wound with surrounding edema. No obvious loculated fluid collection identified. Diffuse subcutaneous edema throughout the midfoot and forefoot. Cellulitis can not be excluded. Electronically Signed   By: Mannie Seek M.D.   On: 10/01/2023 19:00   DG Foot 2 Views Left Result Date: 10/01/2023 CLINICAL DATA:  Wound of the left foot. EXAM: LEFT FOOT - 2 VIEW COMPARISON:  Radiograph dated 08/04/2023. FINDINGS: There is no acute fracture or dislocation. The bones are osteopenic. There is soft tissue swelling of the lateral foot. No radiopaque foreign object or soft tissue gas. IMPRESSION: 1. No acute fracture or dislocation. 2. Soft tissue swelling of the lateral foot. Electronically Signed   By: Angus Bark M.D.   On: 10/01/2023 16:52     Subjective: - no chest pain, shortness of breath, no abdominal pain, nausea or vomiting.   Discharge Exam: BP (!) 157/58   Pulse 82   Temp 98 F (36.7 C)   Resp 16   Ht 5\' 2"  (1.575 m)   Wt 56.6 kg   SpO2 100%   BMI 22.81 kg/m   General: Pt is alert, awake, not in acute distress Cardiovascular: RRR, S1/S2 +, no rubs, no gallops Respiratory: CTA bilaterally, no  wheezing, no rhonchi Abdominal: Soft, NT, ND, bowel sounds + Extremities: no edema, no cyanosis    The results of significant diagnostics from this hospitalization (including imaging, microbiology, ancillary and laboratory) are listed below for reference.     Microbiology: Recent Results (from the past 240 hours)  Blood Culture (routine x 2)     Status: None   Collection Time: 10/01/23  3:20 PM   Specimen: BLOOD  Result Value Ref Range Status   Specimen Description BLOOD RIGHT ANTECUBITAL  Final   Special Requests   Final    BOTTLES DRAWN AEROBIC AND ANAEROBIC Blood Culture results may not be optimal due to an inadequate volume of blood received in culture bottles   Culture   Final    NO GROWTH 5 DAYS Performed at [facility], [address] [place], [place] [address]    Report  Status 10/06/2023 FINAL  Final  Blood Culture (routine x 2)     Status: None   Collection Time: 10/01/23  9:10 PM   Specimen: BLOOD LEFT HAND  Result Value Ref Range Status   Specimen Description BLOOD LEFT HAND  Final   Special Requests   Final    BOTTLES DRAWN AEROBIC AND ANAEROBIC Blood Culture results may not be optimal due to an inadequate volume of blood received in culture bottles   Culture   Final    NO GROWTH 5 DAYS Performed at [facility], [address], [place], [place] [address]    Report Status 10/06/2023 FINAL  Final  Aerobic/Anaerobic Culture w Gram Stain (surgical/deep wound)     Status: None (Preliminary result)   Collection Time: 10/03/23 11:19 AM   Specimen: Soft Tissue, Other  Result Value Ref Range Status   Specimen Description TISSUE  Final   Special Requests LEFT FOOT  Final   Gram Stain   Final    RARE WBC PRESENT,BOTH PMN AND MONONUCLEAR FEW GRAM POSITIVE RODS RARE GRAM POSITIVE COCCI IN PAIRS Performed at [facility], [address] [place], [place] [address]    Culture   Final    RARE ENTEROCOCCUS FAECALIS MODERATE LACTOBACILLUS  SPECIES Standardized susceptibility testing for this organism is not available. FEW CANDIDA ALBICANS NO ANAEROBES ISOLATED; CULTURE IN PROGRESS FOR 5 DAYS    Report Status PENDING  Incomplete   Organism ID, Bacteria ENTEROCOCCUS FAECALIS  Final      Susceptibility   Enterococcus faecalis - MIC*    AMPICILLIN  <=2 SENSITIVE Sensitive     VANCOMYCIN  1 SENSITIVE Sensitive     GENTAMICIN  SYNERGY SENSITIVE Sensitive     * RARE ENTEROCOCCUS FAECALIS  Aerobic/Anaerobic Culture w Gram Stain (surgical/deep wound)     Status: None (Preliminary result)   Collection Time: 10/03/23 11:25 AM   Specimen: Soft Tissue, Other  Result Value Ref Range Status   Specimen Description TISSUE  Final   Special Requests LEFT FOOT  Final   Gram Stain   Final    FEW WBC PRESENT, PREDOMINANTLY PMN RARE GRAM POSITIVE RODS RARE YEAST WITH PSEUDOHYPHAE Performed at [facility], [address], [place], [place] [address]    Culture   Final    RARE ENTEROCOCCUS FAECALIS FEW CANDIDA ALBICANS SUSCEPTIBILITIES PERFORMED ON PREVIOUS CULTURE WITHIN THE LAST 5 DAYS. NO ANAEROBES ISOLATED; CULTURE IN PROGRESS FOR 5 DAYS    Report Status PENDING  Incomplete     Labs: Basic Metabolic Panel: Recent Labs  Lab 10/01/23 1530 10/03/23 0100 10/04/23 0606 10/05/23 0435  NA 126* 132* 139 139  K 4.4 3.6 3.1* 3.6  CL 86* 97* 103 109  CO2 23 24 23  21*  GLUCOSE 553* 226* 221* 128*  BUN 23 18 11 11   CREATININE 1.85* 1.58* 1.31* 1.21*  CALCIUM  8.9 8.2* 8.2* 8.1*  MG  --  1.7 1.7 2.2   Liver Function Tests: Recent Labs  Lab 10/01/23 1530 10/04/23 0606  AST 28 34  ALT 21 18  ALKPHOS 109 99  BILITOT 0.9 0.5  PROT 7.2 6.0*  ALBUMIN 3.0* 2.2*   CBC: Recent Labs  Lab 10/01/23 1530 10/02/23 0615 10/03/23 0100 10/04/23 0606 10/05/23 0435  WBC 15.5* 15.8* 16.2* 13.4* 13.1*  NEUTROABS 13.0*  --   --   --   --   HGB 15.1* 12.4 10.9* 10.7* 10.8*  HCT 46.0 38.5 33.8* 33.1* 34.5*  MCV 84.9 85.6  84.3 83.6  85.8  PLT 205 200 294 308 266   CBG: Recent Labs  Lab 10/06/23 0824 10/06/23 1123 10/06/23 1552 10/06/23 2013 10/07/23 0805  GLUCAP 267* 234* 130* 192* 107*   Hgb A1c No results for input(s): "HGBA1C" in the last 72 hours. Lipid Profile No results for input(s): "CHOL", "HDL", "LDLCALC", "TRIG", "CHOLHDL", "LDLDIRECT" in the last 72 hours. Thyroid  function studies No results for input(s): "TSH", "T4TOTAL", "T3FREE", "THYROIDAB" in the last 72 hours.  Invalid input(s): "FREET3" Urinalysis    Component Value Date/Time   COLORURINE STRAW (A) 08/04/2023 0946   APPEARANCEUR CLEAR 08/04/2023 0946   LABSPEC 1.005 08/04/2023 0946   PHURINE 7.0 08/04/2023 0946   GLUCOSEU >=500 (A) 08/04/2023 0946   HGBUR NEGATIVE 08/04/2023 0946   BILIRUBINUR NEGATIVE 08/04/2023 0946   KETONESUR NEGATIVE 08/04/2023 0946   PROTEINUR 30 (A) 08/04/2023 0946   NITRITE NEGATIVE 08/04/2023 0946   LEUKOCYTESUR NEGATIVE 08/04/2023 0946    FURTHER DISCHARGE INSTRUCTIONS:   Get Medicines reviewed and adjusted: Please take all your medications with you for your next visit with your Primary MD   Laboratory/radiological data: Please request your Primary MD to go over all hospital tests and procedure/radiological results at the follow up, please ask your Primary MD to get all Hospital records sent to his/her office.   In some cases, they will be blood work, cultures and biopsy results pending at the time of your discharge. Please request that your primary care M.D. goes through all the records of your hospital data and follows up on these results.   Also Note the following: If you experience worsening of your admission symptoms, develop shortness of breath, life threatening emergency, suicidal or homicidal thoughts you must seek medical attention immediately by calling 911 or calling your MD immediately  if symptoms less severe.   You must read complete instructions/literature along with all the possible  adverse reactions/side effects for all the Medicines you take and that have been prescribed to you. Take any new Medicines after you have completely understood and accpet all the possible adverse reactions/side effects.    Do not drive when taking Pain medications or sleeping medications (Benzodaizepines)   Do not take more than prescribed Pain, Sleep and Anxiety Medications. It is not advisable to combine anxiety,sleep and pain medications without talking with your primary care practitioner   Special Instructions: If you have smoked or chewed Tobacco  in the last 2 yrs please stop smoking, stop any regular Alcohol  and or any Recreational drug use.   Wear Seat belts while driving.   Please note: You were cared for by a hospitalist during your hospital stay. Once you are discharged, your primary care physician will handle any further medical issues. Please note that NO REFILLS for any discharge medications will be authorized once you are discharged, as it is imperative that you return to your primary care physician (or establish a relationship with a primary care physician if you do not have one) for your post hospital discharge needs so that they can reassess your need for medications and monitor your lab values.  Time coordinating discharge: 35 minutes  SIGNED:  Kathlen Para, MD, PhD 10/07/2023, 10:26 AM

## 2023-10-07 NOTE — Progress Notes (Signed)
 Patient scheduled for discharge, per TOC, her ALF was ready for her, however it appears that this afternoon, the ALF is now not accepting her back until they do an evaluation tomorrow.  Joy Tapia M. Aldona Amel, MD, PhD [facility]  Between 7 am - 7 pm you can contact me via Amion (for emergencies) or Securechat (non urgent matters).  I am not available 7 pm - 7 am, please contact night coverage MD/APP via Amion

## 2023-10-07 NOTE — Plan of Care (Signed)

## 2023-10-07 NOTE — Progress Notes (Signed)
  Subjective:  Patient ID: Joy Tapia, female    DOB: November 25, 1940,  MRN: [id number]  No chief complaint on file.   DOS: 10/03/23 Procedure: 1.  Incision and drainage of abscess and debridement of ulceration first webspace, left foot 2.  Bone biopsy open superficial proximal phalanx second toe left foot  83 y.o. female seen for post op check.  Doing well discussed negative for bone infection and plan for ongoing wound care at her nursing facility. She denies pain.   Review of Systems: Negative except as noted in the HPI. Denies N/V/F/Ch.   Objective:   Constitutional Well developed. Well nourished.  Vascular Foot warm and well perfused. Capillary refill normal to all digits.   No calf pain with palpation  Neurologic Normal speech. Oriented to person, place, and time. Epicritic sensation absent to left forefoot  Dermatologic Dressing C/D/I today.  Orthopedic: Status post I&D first webspace left foot.  Prior right foot TMA well-healed   Radiographs: Deferred postop soft tissue procedure  Pathology: Negative for acute OM in 2nd toe phalanx  Micro: Enterococcus faecalis and lactobacillus species,  yeast  Assessment:   1. Cellulitis of left lower extremity   2. Infected wound   3. Hyperglycemia   4. AKI (acute kidney injury) (HCC)     Plan:  Patient was evaluated and treated and all questions answered.  POD # 4 s/p left foot I&D with bone biopsy second toe -Progressing as expected postoperatively, resolving cellulitis of the left lower extremity -Pathology without evidence of OM, no surgical plans. Continue wound care. -Begin daily dressing change with dry 1/4 iodoform packing dressing packing change to left foot cover with 4x4 gauze Kerlix roll Ace wrap - continue this at nursing facility -XR: None -WB Status: Weightbearing as tolerated in postop shoe to left foot -Sutures: None. -Medications/ABX: Appreciate ID consult, plan for augmentin and diflucan until  10/17/23 -Dressing: Daily dressing changes per RN/ nursing facility beginning today - F/u Plan: PT to follow up next week tues/thurs will have office call her friend cathy to set up. Will sign off.         Maridee Shoemaker, DPM [facility] / [facility]

## 2023-10-08 LAB — AEROBIC/ANAEROBIC CULTURE W GRAM STAIN (SURGICAL/DEEP WOUND)

## 2023-10-08 LAB — GLUCOSE, CAPILLARY
Glucose-Capillary: 119 mg/dL — ABNORMAL HIGH (ref 70–99)
Glucose-Capillary: 158 mg/dL — ABNORMAL HIGH (ref 70–99)

## 2023-10-08 MED ORDER — FUROSEMIDE 40 MG PO TABS: ORAL_TABLET | ORAL | 0 refills | Status: DC

## 2023-10-08 NOTE — Progress Notes (Signed)
 Mobility Specialist Progress Note:    10/08/23 1136  Mobility  Activity Ambulated with assistance in hallway  Level of Assistance Standby assist, set-up cues, supervision of patient - no hands on  Assistive Device Front wheel walker  Distance Ambulated (ft) 200 ft  Activity Response Tolerated well  Mobility Referral Yes  Mobility visit 1 Mobility  Mobility Specialist Start Time (ACUTE ONLY) 0920  Mobility Specialist Stop Time (ACUTE ONLY) 0935  Mobility Specialist Time Calculation (min) (ACUTE ONLY) 15 min   Received pt in chair having no complaints and agreeable to mobility. Pt was asymptomatic throughout ambulation and returned to room w/o fault. Left in chair w/ call bell in reach and all needs met. Chair alarm on.   Inetta Manes Mobility Specialist  Please contact vis Secure Chat or  Rehab Office [phone]

## 2023-10-08 NOTE — TOC Transition Note (Signed)
 Transition of Care [facility]) - Discharge Note   Patient Details  Name: Joy Tapia MRN: [id number] Date of Birth: 01-03-1941  [facility] CM/SW Contact:  Jeani Mill, RN Phone Number: 10/08/2023, 1:36 PM   Clinical Narrative:    Patient stable for discharge.  Notified Brandi with [facility] of discharge.  [facility] called.  Spoke to [employer], Mrs. Pauletta Boroughs who is agreeable for patient to transfer.     Final next level of care: Assisted Living Barriers to Discharge: Barriers Resolved   Patient Goals and CMS Choice Patient states their goals for this hospitalization and ongoing recovery are:: Unable to assess          Discharge Placement             ALF   Patient to be transferred to facility by: PT[facility]      Discharge Plan and Services Additional resources added to the After Visit Summary for   In-house Referral: Clinical Social Work                                   Social Drivers of Health (SDOH) Interventions SDOH Screenings   Food Insecurity: No Food Insecurity (10/01/2023)  Housing: Low Risk  (10/01/2023)  Transportation Needs: No Transportation Needs (10/01/2023)  Utilities: Not At Risk (10/01/2023)  Social Connections: Moderately Isolated (10/01/2023)  Tobacco Use: Medium Risk (10/03/2023)     Readmission Risk Interventions    04/28/2023    3:08 PM  Readmission Risk Prevention Plan  Transportation Screening Complete  PCP or Specialist Appt within 5-7 Days Complete  Home Care Screening Complete  Medication Review (RN CM) Complete

## 2023-10-08 NOTE — Progress Notes (Signed)
 Spoke with RN from Assisted Living. Pt is ready to go according to the nurse that came to interview his morning. Discharge packet has been prepared.    Joy Tapia  Verline Glow, LPN

## 2023-10-08 NOTE — TOC Progression Note (Addendum)
 Transition of Care [facility]) - Progression Note    Patient Details  Name: Joy Tapia MRN: [id number] Date of Birth: 1940/12/08  [facility]) CM/SW Contact  Jeani Mill, RN Phone Number: 10/08/2023, 9:22 AM  Clinical Narrative:    Patient active with [facility] at ALF.  Will need home health PT,RN orders.   Expected Discharge Plan: Assisted Living ([facility]) Barriers to Discharge: No Barriers Identified  Expected Discharge Plan and Services In-house Referral: Clinical Social Work     Living arrangements for the past 2 months: Assisted Living Facility Expected Discharge Date: 10/07/23                                     Social Determinants of Health (SDOH) Interventions SDOH Screenings   Food Insecurity: No Food Insecurity (10/01/2023)  Housing: Low Risk  (10/01/2023)  Transportation Needs: No Transportation Needs (10/01/2023)  Utilities: Not At Risk (10/01/2023)  Social Connections: Moderately Isolated (10/01/2023)  Tobacco Use: Medium Risk (10/03/2023)    Readmission Risk Interventions    04/28/2023    3:08 PM  Readmission Risk Prevention Plan  Transportation Screening Complete  PCP or Specialist Appt within 5-7 Days Complete  Home Care Screening Complete  Medication Review (RN CM) Complete

## 2023-10-08 NOTE — Plan of Care (Signed)

## 2023-10-08 NOTE — Plan of Care (Signed)

## 2023-10-08 NOTE — Discharge Summary (Addendum)
 Physician Discharge Summary   Patient: Joy Tapia MRN: [id number] DOB: Jun 07, 1940  Admit date:     10/01/2023  Discharge date: 10/08/23  Discharge Physician: Charlean Congress  [facility]P: [facility]  Recommendations at discharge:  Follow up with PCP to discuss blood sugar management. Repeat BMP in 1 week. Lasix  dose adjusted   Follow-up Information     [facility] Schedule an appointment as soon as possible for a visit in 1 week(s).   Why: with BMP lab to look at kidney and electrolytes, With blood sugar log Contact information: [address] [place] [address] [phone]         Evertt Hoe, DPM. Schedule an appointment as soon as possible for a visit in 1 week(s).   Specialty: Podiatry Why: For wound re-check Contact information: [address] [place] [address] [phone]                Discharge Diagnoses: Principal Problem:   Cellulitis of left foot Active Problems:   Type 2 diabetes mellitus with hyperglycemia, with long-term current use of insulin  (HCC)   Acute kidney injury superimposed on chronic kidney disease (HCC)   Hypertension associated with diabetes (HCC)   Hyperlipidemia associated with type 2 diabetes mellitus (HCC)   Malnutrition of moderate degree   Abscess of left foot  Hospital Course: Joy Tapia is a 83 y.o. female with medical history significant for T2DM, CKD stage IIIb, HTN, HLD, gout, osteomyelitis of right foot s/p TMA, mild dementia who is admitted with diabetic left foot wound with associated cellulitis.  Diabetic wound/cellulitis of the left foot  worsening wound with associated cellulitis and leukocytosis.   MRI limited due to motion degradation but no acute osteomyelitis seen.   Patient was started on antibiotics, podiatry and ID consulted.  She was taken to the OR and underwent debridement on 5/9.  Cultures with gram-positive rods,  gram-positive cocci as well as yeast with pseudohyphae.   Intra op cultures negative Per ID recommendation pt will be placed on Augmentin and Diflucan with end date 10/17/23  Iodoform 1/4 dressing change to left foot. Remove old packing, insert 1/4 packing strip into the wound between 1st and 2nd toe, cover with 4x4 gauze kerlix and secure with ace wrap   Acute kidney injury on CKD stage IIIb creatinine 1.8 on admission, baseline 1.4-1.5.  Received fluids, creatinine has stabilized and currently is at baseline  Essential hypertension- continue Toprol , blood pressure acceptable., norvasc  added. Monitor for swelling worsening.  Lasix  adjusted.   Hyperlipidemia- continue statin  Mild dementia- slight increased delirium while hospitalized, now resolved  History of gout- continue home medications  DM2, insulin -dependent, poorly controlled, with hyperglycemia patient severely hyperglycemic on admission. Overall CBGs better, insulin  regimen adjusted as below  Moderate protein calorie malnutrition  Continue supplement   Pain control - [place]  Controlled Substance Reporting System database was reviewed. and patient was instructed, not to drive, operate heavy machinery, perform activities at heights, swimming or participation in water activities or provide baby-sitting services while on Pain, Sleep and Anxiety Medications; until their outpatient Physician has advised to do so again. Also recommended to not to take more than prescribed Pain, Sleep and Anxiety Medications.  Consultants:  ID Podiatry   Procedures performed:   1.  Incision and drainage of abscess and debridement of ulceration first webspace, left foot 2.  Bone biopsy open superficial proximal phalanx second toe left foot  DISCHARGE MEDICATION: Allergies as  of 10/08/2023       Reactions   Atorvastatin Diarrhea   Allergy not listed on MAR    Azithromycin Other (See Comments)   Pt states burns her insides. Allergy  not listed on MAR    Erythromycin Other (See Comments)   Unknown. Allergy not listed on MAR    Other Other (See Comments)   Adhesive bandage, Anesthesia Extension set  -  unknown. Allergy not listed on MAR    Propoxyphene Other (See Comments)   Allergy not listed on MAR         Medication List     STOP taking these medications    insulin  detemir 100 UNIT/ML injection Commonly known as: LEVEMIR        TAKE these medications    acetaminophen  325 MG tablet Commonly known as: TYLENOL  Take 650 mg by mouth every 8 (eight) hours as needed for mild pain (pain score 1-3) or moderate pain (pain score 4-6).   albuterol  108 (90 Base) MCG/ACT inhaler Commonly known as: VENTOLIN  HFA Inhale 2 puffs into the lungs every 4 (four) hours as needed for wheezing or shortness of breath.   amLODipine  5 MG tablet Commonly known as: NORVASC  Take 1 tablet (5 mg total) by mouth daily.   amoxicillin-clavulanate 875-125 MG tablet Commonly known as: AUGMENTIN Take 1 tablet by mouth every 12 (twelve) hours for 10 days.   aspirin  EC 81 MG tablet Take 81 mg by mouth every morning.   cadexomer iodine  0.9 % gel Commonly known as: IODOSORB Apply 1 Application topically daily as needed for wound care.   cetirizine 10 MG tablet Commonly known as: ZYRTEC Take 10 mg by mouth every morning.   Febuxostat  80 MG Tabs Take 80 mg by mouth every morning.   fluconazole 200 MG tablet Commonly known as: DIFLUCAN Take 1 tablet (200 mg total) by mouth daily for 10 days.   fluticasone  50 MCG/ACT nasal spray Commonly known as: FLONASE  Place 1 spray into both nostrils daily as needed for allergies or rhinitis.   furosemide  40 MG tablet Commonly known as: LASIX  Take 1 tablet (40 mg total) by mouth 2 (two) times daily for 7 days, THEN 1 tablet (40 mg total) daily. Start taking on: Oct 08, 2023 What changed: See the new instructions.   insulin  aspart 100 UNIT/ML injection Commonly known as: novoLOG  Inject  8 Units into the skin 3 (three) times daily with meals.   insulin  glargine-yfgn 100 UNIT/ML injection Commonly known as: SEMGLEE  Inject 0.16 mLs (16 Units total) into the skin at bedtime.   INSULIN  SYRINGE .5CC/28G 28G X 1/2" 0.5 ML Misc 1 Application by Does not apply route daily.   liver oil-zinc  oxide 40 % ointment Commonly known as: DESITIN Apply topically every 6 (six) hours as needed for irritation.   metoprolol  succinate 50 MG 24 hr tablet Commonly known as: TOPROL -XL Take 50 mg by mouth every morning. What changed: Another medication with the same name was removed. Continue taking this medication, and follow the directions you see here.   multivitamin capsule Take 1 capsule by mouth every morning.   mupirocin  ointment 2 % Commonly known as: BACTROBAN  Apply topically every other day.   omeprazole 20 MG capsule Commonly known as: PRILOSEC Take 20 mg by mouth every morning.   oxyCODONE  5 MG immediate release tablet Commonly known as: Oxy IR/ROXICODONE  Take 1 tablet (5 mg total) by mouth every 6 (six) hours as needed for moderate pain (pain score 4-6).   oxymetazoline 0.05 %  nasal spray Commonly known as: AFRIN Place 1 spray into both nostrils 2 (two) times daily as needed for congestion.   pravastatin  20 MG tablet Commonly known as: PRAVACHOL  Take 20 mg by mouth at bedtime.   vitamin B-12 500 MCG tablet Commonly known as: CYANOCOBALAMIN Take 500 mcg by mouth every morning.       Disposition: ALF/ILF Diet recommendation: Carb modified diet  Discharge Exam: Vitals:   10/07/23 1710 10/07/23 1957 10/08/23 0358 10/08/23 0758  BP: 137/66 (!) 152/62 (!) 151/75 (!) 163/61  Pulse: 87 81 79 81  Resp:  16 16   Temp: 97.6 F (36.4 C) 98.1 F (36.7 C) 97.9 F (36.6 C) 98 F (36.7 C)  TempSrc: Oral     SpO2: 99% 100% 100% 97%  Weight:      Height:       General: Appear in mild distress; no visible Abnormal Neck Mass Or lumps, Conjunctiva  normal Cardiovascular: S1 and S2 Present, no Murmur, Respiratory: good respiratory effort, Bilateral Air entry present and CTA, no Crackles, no wheezes Abdomen: Bowel Sound present, Non tender  Extremities: bilateral Pedal edema chronic per pt.  Neurology: alert and oriented to time, place, and person  [facility] Weights   10/05/23 0412 10/06/23 0457 10/06/23 1601  Weight: 54.7 kg 55.3 kg 56.6 kg   Condition at discharge: stable  The results of significant diagnostics from this hospitalization (including imaging, microbiology, ancillary and laboratory) are listed below for reference.   Imaging Studies: MRI Left foot without contrast Result Date: 10/01/2023 CLINICAL DATA:  Soft tissue infection suspected, foot, xray done EXAM: MRI OF THE LEFT FOOT WITHOUT CONTRAST TECHNIQUE: Multiplanar, multisequence MR imaging of the left foot was performed. No intravenous contrast was administered. COMPARISON:  Same day radiographs of the left foot at 4:18 p.m. FINDINGS: Evaluation is significantly compromised due to motion degradation. Bones/Joint/Cartilage No definite marrow signal abnormality identified to suggest acute osteomyelitis, although evaluation is significantly compromised due to motion degradation. No evidence of acute fracture. No appreciable joint effusion. Ligaments Lisfranc ligament appears intact. Muscles and Tendons Diffuse atrophy of the intrinsic musculature of the foot, which may reflect chronic denervation changes. No evidence of significant tenosynovitis. Soft tissue Suspected cutaneous irregularity in the first webspace, between the bases of the first and second proximal phalanges, which may represent a wound with surrounding edema. No obvious loculated fluid collection identified. Diffuse subcutaneous edema throughout the midfoot and forefoot. IMPRESSION: Evaluation is significantly compromised due to motion degradation. 1. No definite marrow signal abnormality identified to suggest acute  osteomyelitis, although evaluation is significantly compromised due to motion degradation. 2. Suspected cutaneous irregularity in the first webspace, between the bases of the first and second proximal phalanges, which may represent a wound with surrounding edema. No obvious loculated fluid collection identified. Diffuse subcutaneous edema throughout the midfoot and forefoot. Cellulitis can not be excluded. Electronically Signed   By: Mannie Seek M.D.   On: 10/01/2023 19:00   DG Foot 2 Views Left Result Date: 10/01/2023 CLINICAL DATA:  Wound of the left foot. EXAM: LEFT FOOT - 2 VIEW COMPARISON:  Radiograph dated 08/04/2023. FINDINGS: There is no acute fracture or dislocation. The bones are osteopenic. There is soft tissue swelling of the lateral foot. No radiopaque foreign object or soft tissue gas. IMPRESSION: 1. No acute fracture or dislocation. 2. Soft tissue swelling of the lateral foot. Electronically Signed   By: Angus Bark M.D.   On: 10/01/2023 16:52    Microbiology: Results for orders  placed or performed during the hospital encounter of 10/01/23  Blood Culture (routine x 2)     Status: None   Collection Time: 10/01/23  3:20 PM   Specimen: BLOOD  Result Value Ref Range Status   Specimen Description BLOOD RIGHT ANTECUBITAL  Final   Special Requests   Final    BOTTLES DRAWN AEROBIC AND ANAEROBIC Blood Culture results may not be optimal due to an inadequate volume of blood received in culture bottles   Culture   Final    NO GROWTH 5 DAYS Performed at [facility], [address], [place], [place] [address]    Report Status 10/06/2023 FINAL  Final  Blood Culture (routine x 2)     Status: None   Collection Time: 10/01/23  9:10 PM   Specimen: BLOOD LEFT HAND  Result Value Ref Range Status   Specimen Description BLOOD LEFT HAND  Final   Special Requests   Final    BOTTLES DRAWN AEROBIC AND ANAEROBIC Blood Culture results may not be optimal due to an inadequate volume of  blood received in culture bottles   Culture   Final    NO GROWTH 5 DAYS Performed at [facility], [address] [place], [place] [address]    Report Status 10/06/2023 FINAL  Final  Aerobic/Anaerobic Culture w Gram Stain (surgical/deep wound)     Status: None (Preliminary result)   Collection Time: 10/03/23 11:19 AM   Specimen: Soft Tissue, Other  Result Value Ref Range Status   Specimen Description TISSUE  Final   Special Requests LEFT FOOT  Final   Gram Stain   Final    RARE WBC PRESENT,BOTH PMN AND MONONUCLEAR FEW GRAM POSITIVE RODS RARE GRAM POSITIVE COCCI IN PAIRS Performed at [facility], [address] [place], [place] [address]    Culture   Final    RARE ENTEROCOCCUS FAECALIS MODERATE LACTOBACILLUS SPECIES Standardized susceptibility testing for this organism is not available. FEW CANDIDA ALBICANS NO ANAEROBES ISOLATED; CULTURE IN PROGRESS FOR 5 DAYS    Report Status PENDING  Incomplete   Organism ID, Bacteria ENTEROCOCCUS FAECALIS  Final      Susceptibility   Enterococcus faecalis - MIC*    AMPICILLIN  <=2 SENSITIVE Sensitive     VANCOMYCIN  1 SENSITIVE Sensitive     GENTAMICIN  SYNERGY SENSITIVE Sensitive     * RARE ENTEROCOCCUS FAECALIS  Aerobic/Anaerobic Culture w Gram Stain (surgical/deep wound)     Status: None (Preliminary result)   Collection Time: 10/03/23 11:25 AM   Specimen: Soft Tissue, Other  Result Value Ref Range Status   Specimen Description TISSUE  Final   Special Requests LEFT FOOT  Final   Gram Stain   Final    FEW WBC PRESENT, PREDOMINANTLY PMN RARE GRAM POSITIVE RODS RARE YEAST WITH PSEUDOHYPHAE Performed at [facility], [address] [place], [place] [address]    Culture   Final    RARE ENTEROCOCCUS FAECALIS FEW CANDIDA ALBICANS SUSCEPTIBILITIES PERFORMED ON PREVIOUS CULTURE WITHIN THE LAST 5 DAYS. NO ANAEROBES ISOLATED; CULTURE IN PROGRESS FOR 5 DAYS    Report Status PENDING  Incomplete   Labs: CBC: Recent  Labs  Lab 10/01/23 1530 10/02/23 0615 10/03/23 0100 10/04/23 0606 10/05/23 0435  WBC 15.5* 15.8* 16.2* 13.4* 13.1*  NEUTROABS 13.0*  --   --   --   --   HGB 15.1* 12.4 10.9* 10.7* 10.8*  HCT 46.0 38.5 33.8* 33.1* 34.5*  MCV 84.9 85.6  84.3 83.6 85.8  PLT 205 200 294 308 266   Basic Metabolic Panel: Recent Labs  Lab 10/01/23 1530 10/03/23 0100 10/04/23 0606 10/05/23 0435  NA 126* 132* 139 139  K 4.4 3.6 3.1* 3.6  CL 86* 97* 103 109  CO2 23 24 23  21*  GLUCOSE 553* 226* 221* 128*  BUN 23 18 11 11   CREATININE 1.85* 1.58* 1.31* 1.21*  CALCIUM  8.9 8.2* 8.2* 8.1*  MG  --  1.7 1.7 2.2   Liver Function Tests: Recent Labs  Lab 10/01/23 1530 10/04/23 0606  AST 28 34  ALT 21 18  ALKPHOS 109 99  BILITOT 0.9 0.5  PROT 7.2 6.0*  ALBUMIN 3.0* 2.2*   CBG: Recent Labs  Lab 10/07/23 0805 10/07/23 1146 10/07/23 1709 10/07/23 2147 10/08/23 0759  GLUCAP 107* 138* 208* 113* 119*    Discharge time spent: greater than 30 minutes.  Author: Charlean Congress, MD  [facility]

## 2023-10-10 ENCOUNTER — Telehealth: Payer: Self-pay | Admitting: Podiatry

## 2023-10-10 NOTE — Telephone Encounter (Signed)
 [facility]) is requesting clarification on how often Iodoform packing needs to be done. Please contact Jessica at [phone]

## 2023-10-14 ENCOUNTER — Ambulatory Visit (INDEPENDENT_AMBULATORY_CARE_PROVIDER_SITE_OTHER): Admitting: Podiatry

## 2023-10-14 DIAGNOSIS — Z9889 Other specified postprocedural states: Secondary | ICD-10-CM | POA: Diagnosis not present

## 2023-10-14 DIAGNOSIS — L02612 Cutaneous abscess of left foot: Secondary | ICD-10-CM | POA: Diagnosis not present

## 2023-10-14 DIAGNOSIS — L97522 Non-pressure chronic ulcer of other part of left foot with fat layer exposed: Secondary | ICD-10-CM | POA: Diagnosis not present

## 2023-10-14 DIAGNOSIS — E11621 Type 2 diabetes mellitus with foot ulcer: Secondary | ICD-10-CM | POA: Diagnosis not present

## 2023-10-14 MED ORDER — AMOXICILLIN-POT CLAVULANATE 875-125 MG PO TABS: 1.0000 | ORAL_TABLET | Freq: Two times a day (BID) | ORAL | 0 refills | Status: DC

## 2023-10-14 NOTE — Progress Notes (Signed)
  Subjective:  Patient ID: Joy Tapia, female    DOB: 03-19-1941,  MRN: [id number]  Chief Complaint  Patient presents with   Post-op Follow-up    Wound at left webspace is not healing well, per wound care nurse at facility. Wound is packed with iodoform. Very moist edges, white in color. Redness on top of foot and up ankle. Patient has POA in room with her as she has no family. The facility she is at is advising that the patient needs increased care (skilled nursing) compared to the level she is at now with th enon-healing wound she has.     DOS: 10/03/23 Procedure: 1.  Incision and drainage of abscess and debridement of ulceration first webspace, left foot 2.  Bone biopsy open superficial proximal phalanx second toe left foot  83 y.o. female seen for post op check.  No concern for pain.  Concern is that the wound is not healing very well and is macerated per wound care nurse.  Has been getting iodoform packing dressing changes every other day.  Unclear if she is still getting antibiotics.  May be getting transferred to long-term care facility from her current facility.  Review of Systems: Negative except as noted in the HPI. Denies N/V/F/Ch.   Objective:   Constitutional Well developed. Well nourished.  Vascular Foot warm and well perfused. Capillary refill normal to all digits.   No calf pain with palpation  Neurologic Normal speech. Oriented to person, place, and time. Epicritic sensation absent to left forefoot  Dermatologic Maceration noted about the wound site in the first webspace.  There is no purulence expressed.  Mild erythema dorsal foot but improved from admission to the hospital previously. Product tissues present in the wound bed.  No active drainage noted   Orthopedic: Status post I&D first webspace left foot.  Prior right foot TMA well-healed   Radiographs: Deferred postop soft tissue procedure  Pathology: Negative for acute OM in 2nd toe phalanx  Micro:  Enterococcus faecalis and lactobacillus species,  yeast  Assessment:   1. Abscess of left foot   2. Diabetic ulcer of other part of left foot associated with type 2 diabetes mellitus, with fat layer exposed (HCC)   3. Post-operative state      Plan:  Patient was evaluated and treated and all questions answered.  1 week s/p left foot I&D with bone biopsy second toe -Progressing as expected postoperatively -Continue local wound care -Pathology without evidence of OM and second toe - Continue every other daily dressing change with Betadine soaked gauze packing dressing packing change to left foot cover with 4x4 gauze Kerlix roll Ace wrap -orders written for nursing facility -XR: None -WB Status: Weightbearing as tolerated in postop shoe to left foot -Sutures: None. -Medications/ABX: Appreciate ID consult, plan for augmentin  and diflucan  until 10/17/23, added additional Rx for Augmentin  for additional 10 days -Dressing: As above continue every other day dressing changes per wound care nurse at facility - F/u Plan: Follow-up in 2 weeks for wound check        Maridee Shoemaker, DPM [facility] / [facility]

## 2023-10-30 ENCOUNTER — Ambulatory Visit (INDEPENDENT_AMBULATORY_CARE_PROVIDER_SITE_OTHER): Admitting: Podiatry

## 2023-10-30 DIAGNOSIS — Z89439 Acquired absence of unspecified foot: Secondary | ICD-10-CM

## 2023-10-30 DIAGNOSIS — E11621 Type 2 diabetes mellitus with foot ulcer: Secondary | ICD-10-CM

## 2023-10-30 DIAGNOSIS — L97522 Non-pressure chronic ulcer of other part of left foot with fat layer exposed: Secondary | ICD-10-CM

## 2023-10-30 DIAGNOSIS — Z9889 Other specified postprocedural states: Secondary | ICD-10-CM

## 2023-10-30 DIAGNOSIS — L02612 Cutaneous abscess of left foot: Secondary | ICD-10-CM | POA: Diagnosis not present

## 2023-10-30 NOTE — Progress Notes (Signed)
  Subjective:  Patient ID: Joy Tapia, female    DOB: Jul 05, 1940,  MRN: [id number]  Chief Complaint  Patient presents with   Post-op Follow-up    Left foot. Skin is drying up. Wound care is done at facility every other day. Patient denies pain.     DOS: 10/03/23 Procedure: 1.  Incision and drainage of abscess and debridement of ulceration first webspace, left foot 2.  Bone biopsy open superficial proximal phalanx second toe left foot  83 y.o. female seen for post op check.  No concern for pain.    Has been getting iodoform packing dressing changes every other day per prior order.  Overall improved from prior skin is drying up and decreased redness.  Walking in bilateral postop shoe right foot with TMA well-healed.  Review of Systems: Negative except as noted in the HPI. Denies N/V/F/Ch.   Objective:   Constitutional Well developed. Well nourished.  Vascular Foot warm and well perfused. Capillary refill normal to all digits.   No calf pain with palpation  Neurologic Normal speech. Oriented to person, place, and time. Epicritic sensation absent to left forefoot  Dermatologic Decreased maceration noted about the wound site in the first webspace.  There is no purulence expressed.  No significant erythema dorsal foot wound with significant infill of healthy granular tissue   Orthopedic: Status post I&D first webspace left foot.  Prior right foot TMA well-healed   Radiographs: Deferred postop soft tissue procedure  Pathology: Negative for acute OM in 2nd toe phalanx  Micro: Enterococcus faecalis and lactobacillus species,  yeast  Assessment:   1. Abscess of left foot   2. Diabetic ulcer of other part of left foot associated with type 2 diabetes mellitus, with fat layer exposed (HCC)   3. History of transmetatarsal amputation of foot (HCC)   4. Post-operative state       Plan:  Patient was evaluated and treated and all questions answered.  3 week s/p left foot I&D  with bone biopsy second toe -Progressing as expected postoperatively -very healthy wound today no evidence of residual infection healing nicely -Continue local wound care, wound is much improved with local wound care since prior visit -Debrided the ulceration in the first webspace with tissue nipper to healthy bleeding subcutaneous fat tissue significant infill of healthy tissue as well as smaller wound dimensions from prior. -Pathology without evidence of OM and second toe - Continue every other daily dressing change with Betadine soaked gauze packing dressing packing change to left foot cover with 4x4 gauze Kerlix roll Ace wrap -orders written for nursing facility -XR: None -WB Status: Weightbearing as tolerated in postop shoe to left foot -will consider having the patient casted for diabetic shoe and liner bilateral foot in the near future -Sutures: None. -Medications/ABX: Status post Augmentin  Diflucan  as well as an additional course of Augmentin  no further antibiotics indicated -Dressing: As above continue every other day dressing changes per wound care nurse at facility - F/u Plan: Follow-up in 3 weeks for wound check        Maridee Shoemaker, DPM [facility] / [facility]

## 2023-11-06 ENCOUNTER — Encounter: Admitting: Podiatry

## 2023-11-18 ENCOUNTER — Ambulatory Visit (INDEPENDENT_AMBULATORY_CARE_PROVIDER_SITE_OTHER): Admitting: Podiatry

## 2023-11-18 DIAGNOSIS — L02612 Cutaneous abscess of left foot: Secondary | ICD-10-CM | POA: Diagnosis not present

## 2023-11-18 DIAGNOSIS — L97522 Non-pressure chronic ulcer of other part of left foot with fat layer exposed: Secondary | ICD-10-CM

## 2023-11-18 DIAGNOSIS — Z89439 Acquired absence of unspecified foot: Secondary | ICD-10-CM

## 2023-11-18 DIAGNOSIS — L97319 Non-pressure chronic ulcer of right ankle with unspecified severity: Secondary | ICD-10-CM | POA: Diagnosis not present

## 2023-11-18 DIAGNOSIS — I83013 Varicose veins of right lower extremity with ulcer of ankle: Secondary | ICD-10-CM | POA: Diagnosis not present

## 2023-11-18 DIAGNOSIS — Z9889 Other specified postprocedural states: Secondary | ICD-10-CM

## 2023-11-18 DIAGNOSIS — E11621 Type 2 diabetes mellitus with foot ulcer: Secondary | ICD-10-CM

## 2023-11-18 NOTE — Progress Notes (Signed)
 Subjective:  Patient ID: Joy Tapia, female    DOB: 25-Jan-1941,  MRN: [id number]  Chief Complaint  Patient presents with   Wound Check    Left foot is moist in appearance. Only covering is an ace wrap. Does have area on right ankle that is red and inflamed with drainage.     DOS: 10/03/23 Procedure: 1.  Incision and drainage of abscess and debridement of ulceration first webspace, left foot 2.  Bone biopsy open superficial proximal phalanx second toe left foot  83 y.o. female seen for post op check.  Overall doing well.   Has been getting iodoform packing dressing changes every other day per prior order.  Overall improved with positive wound healing since last visit.  Walking in bilateral postop shoe right foot with TMA well-healed.  There is concern for some new opening on the right ankle superficially with some drainage and itching per the patient.  There is report that the patient does pick at the scabs occasionally.  Review of Systems: Negative except as noted in the HPI. Denies N/V/F/Ch.   Objective:   Constitutional Well developed. Well nourished.  Vascular Foot warm and well perfused. Capillary refill normal to all digits.   No calf pain with palpation  Neurologic Normal speech. Oriented to person, place, and time. Epicritic sensation absent to left forefoot  Dermatologic Much improved first webspace wound nearly fully healed at this time.  The plantar second metatarsal head ulceration is healed completely at this time. On the right ankle there is superficial venous stasis ulcerations present with some weeping mild cellulitis surrounding         Orthopedic: Status post I&D first webspace left foot.  Prior right foot TMA well-healed   Radiographs: Deferred postop soft tissue procedure  Pathology: Negative for acute OM in 2nd toe phalanx  Micro: Enterococcus faecalis and lactobacillus species,  yeast  Assessment:   1. Venous stasis ulcer of ankle, right  (HCC)   2. Abscess of left foot   3. Diabetic ulcer of other part of left foot associated with type 2 diabetes mellitus, with fat layer exposed (HCC)   4. History of transmetatarsal amputation of foot (HCC)   5. Post-operative state     Plan:  Patient was evaluated and treated and all questions answered.  3 week s/p left foot I&D with bone biopsy second toe -Progressing as expected postoperatively -very healthy wound today no evidence of residual infection healing nicely -Continue local wound care, wound is much improved with local wound care since prior visit -Debrided the ulceration in the first webspace with tissue nipper to healthy bleeding subcutaneous fat tissue significant infill of healthy tissue as well as smaller wound dimensions from prior. -Pathology without evidence of OM and second toe - Continue every 3-day dressing change with Betadine soaked gauze packing dressing packing change to left foot cover with 4x4 gauze Kerlix roll Ace wrap -orders written for nursing facility -XR: None -WB Status: Weightbearing as tolerated in postop shoe to left foot   -Sutures: None. -Medications/ABX: Status post Augmentin  Diflucan  as well as an additional course of Augmentin  no further antibiotics indicated -Dressing: As above continue every 3-day dressing change to bilateral lower extremity with compression for venous stasis wounds prevention  # Venous stasis ulceration right anterior ankle - Recommend Xeroform and dry gauze compression wrap with Ace wrap from ankle to proximal calf for edema control - Orders written for nursing facility to proceed with this dressing change every 3 days with  some light compression with Ace wrap.  - F/u Plan: Follow-up in 3 weeks for wound check        Joy Tapia, DPM [facility] / [facility]

## 2023-12-11 ENCOUNTER — Ambulatory Visit (INDEPENDENT_AMBULATORY_CARE_PROVIDER_SITE_OTHER): Admitting: Podiatry

## 2023-12-11 ENCOUNTER — Encounter: Payer: Self-pay | Admitting: Podiatry

## 2023-12-11 DIAGNOSIS — I83013 Varicose veins of right lower extremity with ulcer of ankle: Secondary | ICD-10-CM | POA: Diagnosis not present

## 2023-12-11 DIAGNOSIS — E11621 Type 2 diabetes mellitus with foot ulcer: Secondary | ICD-10-CM

## 2023-12-11 DIAGNOSIS — L02612 Cutaneous abscess of left foot: Secondary | ICD-10-CM | POA: Diagnosis not present

## 2023-12-11 DIAGNOSIS — L97522 Non-pressure chronic ulcer of other part of left foot with fat layer exposed: Secondary | ICD-10-CM

## 2023-12-11 DIAGNOSIS — L97319 Non-pressure chronic ulcer of right ankle with unspecified severity: Secondary | ICD-10-CM

## 2023-12-11 DIAGNOSIS — Z89439 Acquired absence of unspecified foot: Secondary | ICD-10-CM

## 2023-12-11 NOTE — Progress Notes (Signed)
  Subjective:  Patient ID: Joy Tapia, female    DOB: 09-30-1940,  MRN: [id number]  Chief Complaint  Patient presents with   Wound Check     Venous stasis ulcer of ankle right and between 1st and 2nd metatarsal on the left. Leg wrapped up to knee on left leg and moisture.  0 pain. IDDM 9.2 A1C       DOS: 10/03/23 Procedure: 1.  Incision and drainage of abscess and debridement of ulceration first webspace, left foot 2.  Bone biopsy open superficial proximal phalanx second toe left foot  83 y.o. female seen for post op check.  Patient has been doing well has been getting compression wraps at nursing facility.  Denies drainage thinks they are improving with the compression therapy as well as the prior wound care to the left foot  Review of Systems: Negative except as noted in the HPI. Denies N/V/F/Ch.   Objective:   Constitutional Well developed. Well nourished.  Vascular Foot warm and well perfused. Capillary refill normal to all digits.   No calf pain with palpation  Neurologic Normal speech. Oriented to person, place, and time. Epicritic sensation absent to left forefoot  Dermatologic First webspace I&D site on the left foot nearly fully healed at this time with mild eschar overlying tiny area of superficial wound that nearly fully healed.  No drainage maceration erythema or edema noted there is maceration between these 3rd through 5th toes on the left foot    Right foot TMA site healthy with no evidence of edema open wound or erythema    Orthopedic: Status post I&D first webspace left foot.  Prior right foot TMA well-healed   Radiographs: Deferred postop soft tissue procedure  Pathology: Negative for acute OM in 2nd toe phalanx  Micro: Enterococcus faecalis and lactobacillus species,  yeast  Assessment:   1. Venous stasis ulcer of ankle, right (HCC)   2. Abscess of left foot   3. History of transmetatarsal amputation of foot (HCC)   4. Diabetic ulcer of other  part of left foot associated with type 2 diabetes mellitus, with fat layer exposed (HCC)      Plan:  Patient was evaluated and treated and all questions answered.  6 week s/p left foot I&D with bone biopsy second toe -Progressing as expected postoperatively -the I&D site is now fully healed no further wound care needed at that site -Continue 2 layer compression wraps that the nursing facility has been doing for edema bilateral lower extremity -Debrided some eschar overlying the first webspace -Pathology without evidence of OM and second toe - Commend Betadine gauze 2 x 2 in between the 2nd through 5th toes due to some maceration noted interdigitally -XR: None -WB Status: Weightbearing as tolerated in postop shoe to left foot   -Sutures: None. -Medications/ABX: No further antibiotics indicated no evidence of infection -Dressing: Continue to 2-3 times weekly compression wrap dressing change to bilateral lower extremity, continue 2 x 2 gauze with Betadine between the 2nd through 3rd toes due to maceration to prevent skin breakdown.  # Venous stasis ulceration right anterior ankle -Much improved from prior after compression therapy -No open wounds on the right ankle at this time - Recommend continue compression wraps as above   - F/u Plan: Follow-up in 3 weeks for wound check        [name], DPM [facility] / [facility]

## 2023-12-22 ENCOUNTER — Telehealth: Payer: Self-pay | Admitting: Podiatry

## 2023-12-22 NOTE — Telephone Encounter (Signed)
 Medical facility where patient resides requests a summary of the last three visits.  FAX no. [phone].   This info requested was sent today fat 11:04 am from our office

## 2024-01-08 ENCOUNTER — Ambulatory Visit (INDEPENDENT_AMBULATORY_CARE_PROVIDER_SITE_OTHER): Admitting: Podiatry

## 2024-01-08 DIAGNOSIS — M79675 Pain in left toe(s): Secondary | ICD-10-CM | POA: Diagnosis not present

## 2024-01-08 DIAGNOSIS — I83013 Varicose veins of right lower extremity with ulcer of ankle: Secondary | ICD-10-CM

## 2024-01-08 DIAGNOSIS — L97319 Non-pressure chronic ulcer of right ankle with unspecified severity: Secondary | ICD-10-CM

## 2024-01-08 DIAGNOSIS — M79674 Pain in right toe(s): Secondary | ICD-10-CM | POA: Diagnosis not present

## 2024-01-08 DIAGNOSIS — B351 Tinea unguium: Secondary | ICD-10-CM

## 2024-01-08 DIAGNOSIS — L97522 Non-pressure chronic ulcer of other part of left foot with fat layer exposed: Secondary | ICD-10-CM

## 2024-01-08 DIAGNOSIS — E11621 Type 2 diabetes mellitus with foot ulcer: Secondary | ICD-10-CM | POA: Diagnosis not present

## 2024-01-08 NOTE — Progress Notes (Signed)
  Subjective:  Patient ID: SHABRIA EGLEY, female    DOB: [birth year],  MRN: [id number]  Chief Complaint  Patient presents with   Foot Ulcer    Doing better today. Left foot is improving. Wearing surgical shoes today. She did recently move to [facility] in [place]. Caregiver has info from them.     DOS: 10/03/23 Procedure: 1.  Incision and drainage of abscess and debridement of ulceration first webspace, left foot 2.  Bone biopsy open superficial proximal phalanx second toe left foot  83 y.o. female seen for post op check.  Patient has been doing well has been getting compression wraps at nursing facility.  Denies drainage thinks they are improving with the compression therapy as well as the prior wound care to the left foot  Review of Systems: Negative except as noted in the HPI. Denies N/V/F/Ch.   Objective:   Constitutional Well developed. Well nourished.  Vascular Foot warm and well perfused. Capillary refill normal to all digits.   No calf pain with palpation  Neurologic Normal speech. Oriented to person, place, and time. Epicritic sensation absent to left forefoot  Dermatologic First webspace I&D site on the left foot fully healed   Right foot TMA site healthy with no evidence of edema open wound or erythema    Orthopedic: Status post I&D first webspace left foot.  Prior right foot TMA well-healed   Radiographs: Deferred postop soft tissue procedure  Pathology: Negative for acute OM in 2nd toe phalanx  Micro: Enterococcus faecalis and lactobacillus species,  yeast  Assessment:   1. Diabetic ulcer of other part of left foot associated with type 2 diabetes mellitus, with fat layer exposed (HCC)   2. Venous stasis ulcer of ankle, right (HCC)   3. Pain due to onychomycosis of toenails of both feet       Plan:  Patient was evaluated and treated and all questions answered.  6 week s/p left foot I&D with bone biopsy second toe -Progressing as expected  postoperatively -the I&D site is now fully healed no further wound care needed at that site - Compression wraps for venous stasis ulcerations as needed -Debrided some eschar.  Has had a prior wound now. -XR: None -WB Status: Weightbearing as tolerated in postop shoe to left foot   -Sutures: None. -Medications/ABX: No further antibiotics indicated no evidence of infection -Dressing: No further dressing required  # Venous stasis ulceration right anterior ankle -Much improved from prior after compression therapy -No open wounds on the right ankle at this time - Recommend continue compression wraps as above  #Onychomycosis with pain  -Nails palliatively debrided as below. -Educated on self-care  Procedure: Nail Debridement left foot Rationale: Pain Type of Debridement: manual, sharp debridement. Instrumentation: Nail nipper, rotary burr. Number of Nails: 5  - F/u Plan: Follow-up in 3 months for routine nail care on left foot        [name], DPM [facility] / [facility]  m

## 2024-02-09 ENCOUNTER — Emergency Department

## 2024-02-09 ENCOUNTER — Other Ambulatory Visit: Payer: Self-pay

## 2024-02-09 ENCOUNTER — Inpatient Hospital Stay
Admission: EM | Admit: 2024-02-09 | Discharge: 2024-02-13 | DRG: 280 | Disposition: A | Discharge status: Home Health | Source: Skilled Nursing Facility | Attending: Internal Medicine | Admitting: Internal Medicine

## 2024-02-09 DIAGNOSIS — Z881 Allergy status to other antibiotic agents status: Secondary | ICD-10-CM

## 2024-02-09 DIAGNOSIS — E860 Dehydration: Secondary | ICD-10-CM | POA: Diagnosis not present

## 2024-02-09 DIAGNOSIS — E872 Acidosis, unspecified: Secondary | ICD-10-CM | POA: Diagnosis present

## 2024-02-09 DIAGNOSIS — E8729 Other acidosis: Secondary | ICD-10-CM | POA: Diagnosis present

## 2024-02-09 DIAGNOSIS — K219 Gastro-esophageal reflux disease without esophagitis: Secondary | ICD-10-CM | POA: Diagnosis present

## 2024-02-09 DIAGNOSIS — I152 Hypertension secondary to endocrine disorders: Secondary | ICD-10-CM | POA: Diagnosis present

## 2024-02-09 DIAGNOSIS — Z66 Do not resuscitate: Secondary | ICD-10-CM | POA: Diagnosis present

## 2024-02-09 DIAGNOSIS — Z96651 Presence of right artificial knee joint: Secondary | ICD-10-CM | POA: Diagnosis present

## 2024-02-09 DIAGNOSIS — Z794 Long term (current) use of insulin: Secondary | ICD-10-CM

## 2024-02-09 DIAGNOSIS — N1832 Chronic kidney disease, stage 3b: Secondary | ICD-10-CM | POA: Diagnosis present

## 2024-02-09 DIAGNOSIS — I161 Hypertensive emergency: Secondary | ICD-10-CM | POA: Diagnosis present

## 2024-02-09 DIAGNOSIS — N184 Chronic kidney disease, stage 4 (severe): Secondary | ICD-10-CM | POA: Diagnosis present

## 2024-02-09 DIAGNOSIS — N178 Other acute kidney failure: Secondary | ICD-10-CM | POA: Diagnosis present

## 2024-02-09 DIAGNOSIS — I214 Non-ST elevation (NSTEMI) myocardial infarction: Principal | ICD-10-CM | POA: Diagnosis present

## 2024-02-09 DIAGNOSIS — I5181 Takotsubo syndrome: Secondary | ICD-10-CM | POA: Diagnosis present

## 2024-02-09 DIAGNOSIS — Z79891 Long term (current) use of opiate analgesic: Secondary | ICD-10-CM

## 2024-02-09 DIAGNOSIS — Y92239 Unspecified place in hospital as the place of occurrence of the external cause: Secondary | ICD-10-CM | POA: Diagnosis present

## 2024-02-09 DIAGNOSIS — T730XXA Starvation, initial encounter: Secondary | ICD-10-CM

## 2024-02-09 DIAGNOSIS — E785 Hyperlipidemia, unspecified: Secondary | ICD-10-CM | POA: Diagnosis present

## 2024-02-09 DIAGNOSIS — E1122 Type 2 diabetes mellitus with diabetic chronic kidney disease: Secondary | ICD-10-CM | POA: Diagnosis present

## 2024-02-09 DIAGNOSIS — R112 Nausea with vomiting, unspecified: Principal | ICD-10-CM | POA: Diagnosis present

## 2024-02-09 DIAGNOSIS — Z515 Encounter for palliative care: Secondary | ICD-10-CM

## 2024-02-09 DIAGNOSIS — Z634 Disappearance and death of family member: Secondary | ICD-10-CM

## 2024-02-09 DIAGNOSIS — Z7984 Long term (current) use of oral hypoglycemic drugs: Secondary | ICD-10-CM

## 2024-02-09 DIAGNOSIS — Z604 Social exclusion and rejection: Secondary | ICD-10-CM | POA: Diagnosis present

## 2024-02-09 DIAGNOSIS — F423 Hoarding disorder: Secondary | ICD-10-CM | POA: Diagnosis present

## 2024-02-09 DIAGNOSIS — G8929 Other chronic pain: Secondary | ICD-10-CM | POA: Diagnosis present

## 2024-02-09 DIAGNOSIS — I5021 Acute systolic (congestive) heart failure: Secondary | ICD-10-CM | POA: Diagnosis present

## 2024-02-09 DIAGNOSIS — J189 Pneumonia, unspecified organism: Secondary | ICD-10-CM | POA: Diagnosis present

## 2024-02-09 DIAGNOSIS — Z86718 Personal history of other venous thrombosis and embolism: Secondary | ICD-10-CM

## 2024-02-09 DIAGNOSIS — F03B11 Unspecified dementia, moderate, with agitation: Secondary | ICD-10-CM | POA: Diagnosis present

## 2024-02-09 DIAGNOSIS — Z7982 Long term (current) use of aspirin: Secondary | ICD-10-CM

## 2024-02-09 DIAGNOSIS — R000 Tachycardia, unspecified: Secondary | ICD-10-CM | POA: Diagnosis present

## 2024-02-09 DIAGNOSIS — Z9049 Acquired absence of other specified parts of digestive tract: Secondary | ICD-10-CM

## 2024-02-09 DIAGNOSIS — E041 Nontoxic single thyroid nodule: Secondary | ICD-10-CM | POA: Diagnosis present

## 2024-02-09 DIAGNOSIS — N179 Acute kidney failure, unspecified: Secondary | ICD-10-CM | POA: Diagnosis present

## 2024-02-09 DIAGNOSIS — R0602 Shortness of breath: Secondary | ICD-10-CM

## 2024-02-09 DIAGNOSIS — R918 Other nonspecific abnormal finding of lung field: Secondary | ICD-10-CM

## 2024-02-09 DIAGNOSIS — N1411 Contrast-induced nephropathy: Secondary | ICD-10-CM | POA: Diagnosis present

## 2024-02-09 DIAGNOSIS — R0789 Other chest pain: Secondary | ICD-10-CM

## 2024-02-09 DIAGNOSIS — Z888 Allergy status to other drugs, medicaments and biological substances status: Secondary | ICD-10-CM

## 2024-02-09 DIAGNOSIS — F0390 Unspecified dementia without behavioral disturbance: Secondary | ICD-10-CM | POA: Diagnosis present

## 2024-02-09 DIAGNOSIS — T508X5A Adverse effect of diagnostic agents, initial encounter: Secondary | ICD-10-CM | POA: Diagnosis present

## 2024-02-09 DIAGNOSIS — E1159 Type 2 diabetes mellitus with other circulatory complications: Secondary | ICD-10-CM | POA: Diagnosis present

## 2024-02-09 DIAGNOSIS — R824 Acetonuria: Secondary | ICD-10-CM | POA: Diagnosis present

## 2024-02-09 DIAGNOSIS — I2510 Atherosclerotic heart disease of native coronary artery without angina pectoris: Secondary | ICD-10-CM | POA: Diagnosis present

## 2024-02-09 DIAGNOSIS — M810 Age-related osteoporosis without current pathological fracture: Secondary | ICD-10-CM | POA: Diagnosis present

## 2024-02-09 DIAGNOSIS — I249 Acute ischemic heart disease, unspecified: Secondary | ICD-10-CM | POA: Diagnosis present

## 2024-02-09 DIAGNOSIS — Z79899 Other long term (current) drug therapy: Secondary | ICD-10-CM

## 2024-02-09 DIAGNOSIS — Z87891 Personal history of nicotine dependence: Secondary | ICD-10-CM

## 2024-02-09 DIAGNOSIS — K529 Noninfective gastroenteritis and colitis, unspecified: Secondary | ICD-10-CM | POA: Diagnosis present

## 2024-02-09 DIAGNOSIS — E1165 Type 2 diabetes mellitus with hyperglycemia: Secondary | ICD-10-CM | POA: Diagnosis present

## 2024-02-09 DIAGNOSIS — M545 Low back pain, unspecified: Secondary | ICD-10-CM | POA: Diagnosis present

## 2024-02-09 DIAGNOSIS — Z1152 Encounter for screening for COVID-19: Secondary | ICD-10-CM

## 2024-02-09 LAB — BETA-HYDROXYBUTYRIC ACID: Beta-Hydroxybutyric Acid: 0.93 mmol/L — ABNORMAL HIGH (ref 0.05–0.27)

## 2024-02-09 LAB — COMPREHENSIVE METABOLIC PANEL WITH GFR
ALT: 36 U/L (ref 0–44)
AST: 62 U/L — ABNORMAL HIGH (ref 15–41)
Albumin: 4 g/dL (ref 3.5–5.0)
Alkaline Phosphatase: 111 U/L (ref 38–126)
Anion gap: 17 — ABNORMAL HIGH (ref 5–15)
BUN: 32 mg/dL — ABNORMAL HIGH (ref 8–23)
CO2: 17 mmol/L — ABNORMAL LOW (ref 22–32)
Calcium: 9.2 mg/dL (ref 8.9–10.3)
Chloride: 103 mmol/L (ref 98–111)
Creatinine, Ser: 1.4 mg/dL — ABNORMAL HIGH (ref 0.44–1.00)
GFR, Estimated: 38 mL/min — ABNORMAL LOW (ref >60)
Glucose, Bld: 178 mg/dL — ABNORMAL HIGH (ref 70–99)
Potassium: 4.6 mmol/L (ref 3.5–5.1)
Sodium: 137 mmol/L (ref 135–145)
Total Bilirubin: 1.1 mg/dL (ref 0.0–1.2)
Total Protein: 7.4 g/dL (ref 6.5–8.1)

## 2024-02-09 LAB — CBC
HCT: 44.6 % (ref 36.0–46.0)
Hemoglobin: 14.4 g/dL (ref 12.0–15.0)
MCH: 26.9 pg (ref 26.0–34.0)
MCHC: 32.3 g/dL (ref 30.0–36.0)
MCV: 83.2 fL (ref 80.0–100.0)
Platelets: 303 K/uL (ref 150–400)
RBC: 5.36 MIL/uL — ABNORMAL HIGH (ref 3.87–5.11)
RDW: 16.5 % — ABNORMAL HIGH (ref 11.5–15.5)
WBC: 13.7 K/uL — ABNORMAL HIGH (ref 4.0–10.5)
nRBC: 0 % (ref 0.0–0.2)

## 2024-02-09 LAB — LACTIC ACID, PLASMA: Lactic Acid, Venous: 3.3 mmol/L (ref 0.5–1.9)

## 2024-02-09 LAB — BLOOD GAS, VENOUS: Patient temperature: 37

## 2024-02-09 LAB — RESP PANEL BY RT-PCR (RSV, FLU A&B, COVID)  RVPGX2
Influenza A by PCR: NEGATIVE
Influenza B by PCR: NEGATIVE
Resp Syncytial Virus by PCR: NEGATIVE
SARS Coronavirus 2 by RT PCR: NEGATIVE

## 2024-02-09 LAB — TROPONIN I (HIGH SENSITIVITY)
Troponin I (High Sensitivity): 77 ng/L — ABNORMAL HIGH (ref <18)
Troponin I (High Sensitivity): 98 ng/L — ABNORMAL HIGH (ref <18)

## 2024-02-09 LAB — BRAIN NATRIURETIC PEPTIDE: B Natriuretic Peptide: 230.6 pg/mL — ABNORMAL HIGH (ref 0.0–100.0)

## 2024-02-09 LAB — LIPASE, BLOOD: Lipase: 50 U/L (ref 11–51)

## 2024-02-09 MED ORDER — ONDANSETRON HCL 4 MG/2ML IJ SOLN
4.0000 mg | Freq: Once | INTRAMUSCULAR | Status: AC
  Administered 2024-02-09: 4 mg via INTRAVENOUS
  Filled 2024-02-09: qty 2

## 2024-02-09 MED ORDER — LACTATED RINGERS IV BOLUS
500.0000 mL | Freq: Once | INTRAVENOUS | Status: AC
  Administered 2024-02-09: 500 mL via INTRAVENOUS

## 2024-02-09 MED ORDER — DEXTROSE IN LACTATED RINGERS 5 % IV SOLN
1000.0000 mL | Freq: Once | INTRAVENOUS | Status: DC
Start: 2024-02-09 — End: 2024-02-09

## 2024-02-09 MED ORDER — DEXTROSE IN LACTATED RINGERS 5 % IV SOLN
1000.0000 mL | Freq: Once | INTRAVENOUS | Status: AC
  Administered 2024-02-10: 1000 mL via INTRAVENOUS

## 2024-02-09 MED ORDER — IOHEXOL 350 MG/ML SOLN
60.0000 mL | Freq: Once | INTRAVENOUS | Status: AC | PRN
  Administered 2024-02-09: 60 mL via INTRAVENOUS

## 2024-02-09 MED ORDER — PANTOPRAZOLE SODIUM 40 MG IV SOLR
40.0000 mg | Freq: Once | INTRAVENOUS | Status: AC
Start: 2024-02-09 — End: 2024-02-09
  Administered 2024-02-09: 40 mg via INTRAVENOUS
  Filled 2024-02-09: qty 10

## 2024-02-09 MED ORDER — NITROGLYCERIN 0.4 MG SL SUBL
0.4000 mg | SUBLINGUAL_TABLET | Freq: Once | SUBLINGUAL | Status: AC
  Administered 2024-02-09: 0.4 mg via SUBLINGUAL
  Filled 2024-02-09: qty 1

## 2024-02-09 MED ORDER — METOCLOPRAMIDE HCL 5 MG/ML IJ SOLN
10.0000 mg | Freq: Once | INTRAMUSCULAR | Status: AC
  Administered 2024-02-09: 10 mg via INTRAVENOUS
  Filled 2024-02-09: qty 2

## 2024-02-09 MED ORDER — MORPHINE SULFATE (PF) 2 MG/ML IV SOLN
2.0000 mg | Freq: Once | INTRAVENOUS | Status: AC
  Administered 2024-02-09: 2 mg via INTRAVENOUS
  Filled 2024-02-09: qty 1

## 2024-02-09 MED ORDER — HYDROXYZINE HCL 25 MG PO TABS
25.0000 mg | ORAL_TABLET | Freq: Once | ORAL | Status: AC
  Administered 2024-02-09: 25 mg via ORAL
  Filled 2024-02-09: qty 1

## 2024-02-09 NOTE — ED Triage Notes (Addendum)
 Pt comes with c/o nausea , vomiting. Pt denies any belly pain. Pt states bile all day. Pt from [facility].   Pt given 4mg  IM zofran   Pt denies any cp but states some sob. Pt is diabetic. Pt CBG was 130s per EMS.

## 2024-02-09 NOTE — ED Notes (Signed)
 Lab called to come and get pt blood. Unable to get labs at this time.

## 2024-02-09 NOTE — ED Notes (Signed)
 C/O not getting any air. 2l/ [place] placed on patient for comfort.

## 2024-02-09 NOTE — ED Provider Notes (Signed)
 [name] Provider Note    Event Date/Time   First MD Initiated Contact with Patient 02/09/24 1854     (approximate)   History   Emesis   HPI  Joy Tapia is a 83 y.o. female with dementia, CKD, diabetes, GERD, hypertension, hyperlipidemia, prior history of DVT not on anticoagulation presenting with nausea vomiting.  Started today.  States that she is also feeling short of breath.  She denies any abdominal pain or diarrhea, no urinary symptoms or fever, no headache.  Initially says no chest pain but now says that she does have some chest pain with the shortness of breath.  States that the chest pain started after multiple episodes of nausea vomiting.  States that she feels very nauseous right now.  States that her lower extremities bilaterally are a bit more swollen than prior.  Independent history from EMS, she was given 4 mg IM Zofran , had nausea vomiting all day today.  Is diabetic, blood glucose was 130s.  More collateral from power of attorney, patient has been having nausea vomiting since yesterday and has had decreased p.o. intake.     On independent chart review, she was admitted in May of this year for cellulitis of her left foot, was placed on antibiotics.  Physical Exam   Triage Vital Signs: ED Triage Vitals  Encounter Vitals Group     BP 02/09/24 1816 (!) 205/103     Girls Systolic BP Percentile --      Girls Diastolic BP Percentile --      Boys Systolic BP Percentile --      Boys Diastolic BP Percentile --      Pulse Rate 02/09/24 1816 79     Resp 02/09/24 1816 18     Temp 02/09/24 1816 98 F (36.7 C)     Temp src --      SpO2 02/09/24 1816 100 %     Weight 02/09/24 1814 130 lb (59 kg)     Height 02/09/24 1814 4' 11 (1.499 m)     Head Circumference --      Peak Flow --      Pain Score 02/09/24 1814 2     Pain Loc --      Pain Education --      Exclude from Growth Chart --     Most recent vital signs: Vitals:    02/09/24 1900 02/09/24 1940  BP: (!) 226/89 (!) 135/103  Pulse: 74 80  Resp:  17  Temp:    SpO2: 100% 100%     General: Awake, no distress.  CV:  Good peripheral perfusion.  Resp:  Normal effort.  Tachypneic, no increased work of breathing, lungs are clear Abd:  No distention.  Soft nontender to deep palpation Other:  She has some right lower parathoracic tenderness open patient without midline spinal tenderness.  No CVA tenderness bilaterally.  Bilateral lower extremity edema that appears symmetrical is moving all 4 extremities without focal weakness or numbness, no facial droop or slurred speech, patient has a midfoot amputation on her right, do not see any wounds with surrounding erythema or purulent drainage.  No tenderness to her lower extremities.   ED Results / Procedures / Treatments   Labs (all labs ordered are listed, but only abnormal results are displayed) Labs Reviewed  COMPREHENSIVE METABOLIC PANEL WITH GFR - Abnormal; Notable for the following components:      Result Value   CO2 17 (*)  Glucose, Bld 178 (*)    BUN 32 (*)    Creatinine, Ser 1.40 (*)    AST 62 (*)    GFR, Estimated 38 (*)    Anion gap 17 (*)    All other components within normal limits  CBC - Abnormal; Notable for the following components:   WBC 13.7 (*)    RBC 5.36 (*)    RDW 16.5 (*)    All other components within normal limits  BRAIN NATRIURETIC PEPTIDE - Abnormal; Notable for the following components:   B Natriuretic Peptide 230.6 (*)    All other components within normal limits  BETA-HYDROXYBUTYRIC ACID - Abnormal; Notable for the following components:   Beta-Hydroxybutyric Acid 0.93 (*)    All other components within normal limits  LACTIC ACID, PLASMA - Abnormal; Notable for the following components:   Lactic Acid, Venous 3.3 (*)    All other components within normal limits  TROPONIN I (HIGH SENSITIVITY) - Abnormal; Notable for the following components:   Troponin I (High  Sensitivity) 77 (*)    All other components within normal limits  TROPONIN I (HIGH SENSITIVITY) - Abnormal; Notable for the following components:   Troponin I (High Sensitivity) 98 (*)    All other components within normal limits  RESP PANEL BY RT-PCR (RSV, FLU A&B, COVID)  RVPGX2  CULTURE, BLOOD (ROUTINE X 2)  CULTURE, BLOOD (ROUTINE X 2)  LIPASE, BLOOD  URINALYSIS, ROUTINE W REFLEX MICROSCOPIC  BLOOD GAS, VENOUS  LACTIC ACID, PLASMA     EKG  EKG shows, sinus rhythm heart rate 69, normal QS, normal QTc, no obvious ischemic ST elevation, T wave flattening in aVL, T wave changes new compared to prior   RADIOLOGY On my independent interpretation, chest x-ray without obvious consolidation   PROCEDURES:  Critical Care performed: No  Procedures   MEDICATIONS ORDERED IN ED: Medications  dextrose  5 % in lactated ringers  infusion (has no administration in time range)  ondansetron  (ZOFRAN ) injection 4 mg (4 mg Intravenous Given by Other 02/09/24 1909)  ondansetron  (ZOFRAN ) injection 4 mg (4 mg Intravenous Given 02/09/24 1923)  nitroGLYCERIN  (NITROSTAT ) SL tablet 0.4 mg (0.4 mg Sublingual Given 02/09/24 1941)  hydrOXYzine  (ATARAX ) tablet 25 mg (25 mg Oral Given 02/09/24 1954)  pantoprazole  (PROTONIX ) injection 40 mg (40 mg Intravenous Given 02/09/24 2035)  lactated ringers  bolus 500 mL (500 mLs Intravenous New Bag/Given 02/09/24 2142)  iohexol  (OMNIPAQUE ) 350 MG/ML injection 60 mL (60 mLs Intravenous Contrast Given 02/09/24 2207)  morphine  (PF) 2 MG/ML injection 2 mg (2 mg Intravenous Given 02/09/24 2310)  metoCLOPramide  (REGLAN ) injection 10 mg (10 mg Intravenous Given 02/09/24 2310)     IMPRESSION / MDM / ASSESSMENT AND PLAN / ED COURSE  I reviewed the triage vital signs and the nursing notes.                              Differential diagnosis includes, but is not limited to, atypical ACS, angina, UTI, GERD, acid reflux, viral illness, CHF exacerbation, also considered PE.  Will  get labs, EKG, troponin, chest x-ray, IV Zofran , CT PE study and DVT ultrasounds, reassess.  Patient's presentation is most consistent with acute presentation with potential threat to life or bodily function.  EKG without inferior STEMI, will give her some nitroglycerin  here since she is also hypertensive.  Patient seems a little anxious, nauseous.  Will also get a CT head to make sure there is no  intracranial hemorrhage.  She was satting 100% on room air but nurses put her on oxygen given that she kept saying that she was short of breath.  Patient states that the chest pain now feels like chest burning, will give her some IV Protonix .   Independent interpretation of labs and imaging below.  Patient now having some chills, is going to give us  some urine, added blood cultures, lactic acid, VBG.  Her troponin is uptrending.  She will need to be admitted for further management.  Patient signed out pending hospitalist admission and callback.  The patient is on the cardiac monitor to evaluate for evidence of arrhythmia and/or significant heart rate changes.   Clinical Course as of 02/09/24 2349  Mon Feb 09, 2024  2109 Independent review of labs, respiratory viral panel is negative, BNP is mildly elevated, troponin is mildly elevated, lipase is normal, mild leukocytosis, she does have some acidosis with anion gap but her glucose is 178, beta hydroxybutyrate is mildly elevated, did consider if this was starvation ketoacidosis, will give her some fluids. [TT]  2248 CT Angio Chest PE W/Cm &/Or Wo Cm IMPRESSION: No evidence of pulmonary emboli.  Incidental left thyroid  nodule measuring 1.9 cm. Recommend non-emergent thyroid  ultrasound. Reference: J Am Coll Radiol. 2015 Feb;12(2): 143-50  Scattered parenchymal nodules are noted throughout both lungs. The largest of these measures 3 mm. No follow-up needed if patient is low-risk (and has no known or suspected primary neoplasm). Non-contrast chest CT can  be considered in 12 months if patient is high-risk. This recommendation follows the consensus statement: Guidelines for Management of Incidental Pulmonary Nodules Detected on CT Images: From the Fleischner Society 2017; Radiology 2017; 284:228-243.  Aortic Atherosclerosis (ICD10-I70.0).   [TT]  2250 CT Head Wo Contrast Mild atrophic changes without acute abnormality.  [TT]  2344 Received signout: awaiting hospitalist: ho of dementia, diabetes, DVT not AC, presents with 2 days of n/v, chest pain. Imaging negative. Troponin uptrending. Lactate elevated. Acidotic perhaps via starvation ketosis. Came in hypertensive.  [HD]    Clinical Course User Index [HD] Nicholaus Rolland BRAVO, MD [TT] Waymond Lorelle Cummins, MD     FINAL CLINICAL IMPRESSION(S) / ED DIAGNOSES   Final diagnoses:  Nausea and vomiting, unspecified vomiting type  Burning chest pain  SOB (shortness of breath)  Dehydration  Pulmonary nodules  Thyroid  nodule  Starvation ketoacidosis     Rx / DC Orders   ED Discharge Orders     None        Note:  This document was prepared using Dragon voice recognition software and may include unintentional dictation errors.    Waymond Lorelle Cummins, MD 02/09/24 [phone]

## 2024-02-09 NOTE — ED Notes (Signed)
 C/O Nausea and SOB.  Skin warm and dry. NAD

## 2024-02-10 ENCOUNTER — Other Ambulatory Visit: Payer: Self-pay

## 2024-02-10 ENCOUNTER — Inpatient Hospital Stay

## 2024-02-10 ENCOUNTER — Observation Stay (HOSPITAL_COMMUNITY)
Admit: 2024-02-10 | Discharge: 2024-02-10 | Disposition: A | Discharge status: Home / Self Care | Attending: Emergency Medicine | Admitting: Emergency Medicine

## 2024-02-10 ENCOUNTER — Encounter
Admission: EM | Disposition: A | Discharge status: Home Health | Payer: Self-pay | Source: Skilled Nursing Facility | Attending: Internal Medicine

## 2024-02-10 ENCOUNTER — Telehealth (HOSPITAL_COMMUNITY): Payer: Self-pay | Admitting: Pharmacy Technician

## 2024-02-10 ENCOUNTER — Other Ambulatory Visit (HOSPITAL_COMMUNITY): Payer: Self-pay

## 2024-02-10 ENCOUNTER — Encounter: Payer: Self-pay | Admitting: Emergency Medicine

## 2024-02-10 ENCOUNTER — Observation Stay

## 2024-02-10 DIAGNOSIS — I152 Hypertension secondary to endocrine disorders: Secondary | ICD-10-CM | POA: Diagnosis present

## 2024-02-10 DIAGNOSIS — J189 Pneumonia, unspecified organism: Secondary | ICD-10-CM | POA: Diagnosis present

## 2024-02-10 DIAGNOSIS — R079 Chest pain, unspecified: Secondary | ICD-10-CM | POA: Diagnosis not present

## 2024-02-10 DIAGNOSIS — Z794 Long term (current) use of insulin: Secondary | ICD-10-CM | POA: Diagnosis not present

## 2024-02-10 DIAGNOSIS — E860 Dehydration: Secondary | ICD-10-CM | POA: Diagnosis present

## 2024-02-10 DIAGNOSIS — I5021 Acute systolic (congestive) heart failure: Secondary | ICD-10-CM

## 2024-02-10 DIAGNOSIS — R112 Nausea with vomiting, unspecified: Secondary | ICD-10-CM | POA: Diagnosis not present

## 2024-02-10 DIAGNOSIS — N178 Other acute kidney failure: Secondary | ICD-10-CM | POA: Diagnosis present

## 2024-02-10 DIAGNOSIS — I214 Non-ST elevation (NSTEMI) myocardial infarction: Secondary | ICD-10-CM | POA: Diagnosis present

## 2024-02-10 DIAGNOSIS — F03B11 Unspecified dementia, moderate, with agitation: Secondary | ICD-10-CM | POA: Diagnosis present

## 2024-02-10 DIAGNOSIS — E785 Hyperlipidemia, unspecified: Secondary | ICD-10-CM | POA: Diagnosis present

## 2024-02-10 DIAGNOSIS — E041 Nontoxic single thyroid nodule: Secondary | ICD-10-CM | POA: Diagnosis present

## 2024-02-10 DIAGNOSIS — N189 Chronic kidney disease, unspecified: Secondary | ICD-10-CM | POA: Diagnosis not present

## 2024-02-10 DIAGNOSIS — E1165 Type 2 diabetes mellitus with hyperglycemia: Secondary | ICD-10-CM | POA: Diagnosis present

## 2024-02-10 DIAGNOSIS — Z515 Encounter for palliative care: Secondary | ICD-10-CM | POA: Diagnosis not present

## 2024-02-10 DIAGNOSIS — G8929 Other chronic pain: Secondary | ICD-10-CM | POA: Diagnosis present

## 2024-02-10 DIAGNOSIS — E1122 Type 2 diabetes mellitus with diabetic chronic kidney disease: Secondary | ICD-10-CM | POA: Diagnosis present

## 2024-02-10 DIAGNOSIS — I5181 Takotsubo syndrome: Secondary | ICD-10-CM | POA: Diagnosis present

## 2024-02-10 DIAGNOSIS — I161 Hypertensive emergency: Secondary | ICD-10-CM

## 2024-02-10 DIAGNOSIS — Z1152 Encounter for screening for COVID-19: Secondary | ICD-10-CM | POA: Diagnosis not present

## 2024-02-10 DIAGNOSIS — K219 Gastro-esophageal reflux disease without esophagitis: Secondary | ICD-10-CM | POA: Diagnosis present

## 2024-02-10 DIAGNOSIS — E8729 Other acidosis: Secondary | ICD-10-CM | POA: Diagnosis present

## 2024-02-10 DIAGNOSIS — I249 Acute ischemic heart disease, unspecified: Secondary | ICD-10-CM | POA: Diagnosis present

## 2024-02-10 DIAGNOSIS — Y92239 Unspecified place in hospital as the place of occurrence of the external cause: Secondary | ICD-10-CM | POA: Diagnosis present

## 2024-02-10 DIAGNOSIS — N184 Chronic kidney disease, stage 4 (severe): Secondary | ICD-10-CM | POA: Diagnosis present

## 2024-02-10 DIAGNOSIS — Z7189 Other specified counseling: Secondary | ICD-10-CM | POA: Diagnosis not present

## 2024-02-10 DIAGNOSIS — Z66 Do not resuscitate: Secondary | ICD-10-CM | POA: Diagnosis present

## 2024-02-10 DIAGNOSIS — E1159 Type 2 diabetes mellitus with other circulatory complications: Secondary | ICD-10-CM | POA: Diagnosis present

## 2024-02-10 DIAGNOSIS — I2510 Atherosclerotic heart disease of native coronary artery without angina pectoris: Secondary | ICD-10-CM | POA: Diagnosis present

## 2024-02-10 LAB — URINALYSIS, ROUTINE W REFLEX MICROSCOPIC
Bacteria, UA: NONE SEEN
Bilirubin Urine: NEGATIVE
Glucose, UA: 150 mg/dL — AB
Hgb urine dipstick: NEGATIVE
Ketones, ur: 5 mg/dL — AB
Leukocytes,Ua: NEGATIVE
Nitrite: NEGATIVE
Protein, ur: >=300 mg/dL — AB
Specific Gravity, Urine: 1.015 (ref 1.005–1.030)
pH: 6 (ref 5.0–8.0)

## 2024-02-10 LAB — CBC
HCT: 42.8 % (ref 36.0–46.0)
Hemoglobin: 14.3 g/dL (ref 12.0–15.0)
MCH: 26.9 pg (ref 26.0–34.0)
MCHC: 33.4 g/dL (ref 30.0–36.0)
MCV: 80.6 fL (ref 80.0–100.0)
Platelets: 352 K/uL (ref 150–400)
RBC: 5.31 MIL/uL — ABNORMAL HIGH (ref 3.87–5.11)
RDW: 16.3 % — ABNORMAL HIGH (ref 11.5–15.5)
WBC: 17.7 K/uL — ABNORMAL HIGH (ref 4.0–10.5)
nRBC: 0 % (ref 0.0–0.2)

## 2024-02-10 LAB — BRAIN NATRIURETIC PEPTIDE: B Natriuretic Peptide: 2028.4 pg/mL — ABNORMAL HIGH (ref 0.0–100.0)

## 2024-02-10 LAB — COMPREHENSIVE METABOLIC PANEL WITH GFR
ALT: 36 U/L (ref 0–44)
AST: 53 U/L — ABNORMAL HIGH (ref 15–41)
Albumin: 3.8 g/dL (ref 3.5–5.0)
Alkaline Phosphatase: 111 U/L (ref 38–126)
Anion gap: 19 — ABNORMAL HIGH (ref 5–15)
BUN: 26 mg/dL — ABNORMAL HIGH (ref 8–23)
CO2: 16 mmol/L — ABNORMAL LOW (ref 22–32)
Calcium: 9.2 mg/dL (ref 8.9–10.3)
Chloride: 98 mmol/L (ref 98–111)
Creatinine, Ser: 1.4 mg/dL — ABNORMAL HIGH (ref 0.44–1.00)
GFR, Estimated: 38 mL/min — ABNORMAL LOW (ref >60)
Glucose, Bld: 427 mg/dL — ABNORMAL HIGH (ref 70–99)
Potassium: 4.5 mmol/L (ref 3.5–5.1)
Sodium: 133 mmol/L — ABNORMAL LOW (ref 135–145)
Total Bilirubin: 1 mg/dL (ref 0.0–1.2)
Total Protein: 7.5 g/dL (ref 6.5–8.1)

## 2024-02-10 LAB — LACTIC ACID, PLASMA
Lactic Acid, Venous: 4.6 mmol/L (ref 0.5–1.9)
Lactic Acid, Venous: 3.8 mmol/L (ref 0.5–1.9)
Lactic Acid, Venous: 4.1 mmol/L (ref 0.5–1.9)
Lactic Acid, Venous: 3.1 mmol/L (ref 0.5–1.9)
Lactic Acid, Venous: 2.7 mmol/L (ref 0.5–1.9)
Lactic Acid, Venous: 1.7 mmol/L (ref 0.5–1.9)

## 2024-02-10 LAB — BASIC METABOLIC PANEL WITH GFR
Anion gap: 20 — ABNORMAL HIGH (ref 5–15)
BUN: 25 mg/dL — ABNORMAL HIGH (ref 8–23)
CO2: 19 mmol/L — ABNORMAL LOW (ref 22–32)
Calcium: 9.6 mg/dL (ref 8.9–10.3)
Chloride: 100 mmol/L (ref 98–111)
Creatinine, Ser: 1.49 mg/dL — ABNORMAL HIGH (ref 0.44–1.00)
GFR, Estimated: 35 mL/min — ABNORMAL LOW (ref >60)
Glucose, Bld: 216 mg/dL — ABNORMAL HIGH (ref 70–99)
Potassium: 3.9 mmol/L (ref 3.5–5.1)
Sodium: 139 mmol/L (ref 135–145)

## 2024-02-10 LAB — ECHOCARDIOGRAM COMPLETE
AR max vel: 1.14 cm2
AV Area VTI: 1.06 cm2
AV Area mean vel: 1.11 cm2
AV Mean grad: 4 mmHg
AV Peak grad: 7.4 mmHg
Ao pk vel: 1.36 m/s
Area-P 1/2: 5.97 cm2
Height: 59 in
MV M vel: 5.06 m/s
MV Peak grad: 102.4 mmHg
MV VTI: 1.2 cm2
S' Lateral: 3 cm
Single Plane A4C EF: 14.6 %
Weight: 2080 [oz_av]

## 2024-02-10 LAB — CBG MONITORING, ED
Glucose-Capillary: 446 mg/dL — ABNORMAL HIGH (ref 70–99)
Glucose-Capillary: 211 mg/dL — ABNORMAL HIGH (ref 70–99)
Glucose-Capillary: 196 mg/dL — ABNORMAL HIGH (ref 70–99)
Glucose-Capillary: 199 mg/dL — ABNORMAL HIGH (ref 70–99)
Glucose-Capillary: 162 mg/dL — ABNORMAL HIGH (ref 70–99)
Glucose-Capillary: 116 mg/dL — ABNORMAL HIGH (ref 70–99)

## 2024-02-10 LAB — HEMOGLOBIN A1C
Hgb A1c MFr Bld: 9 % — ABNORMAL HIGH (ref 4.8–5.6)
Mean Plasma Glucose: 211.6 mg/dL

## 2024-02-10 LAB — PROCALCITONIN: Procalcitonin: 0.1 ng/mL

## 2024-02-10 LAB — TROPONIN I (HIGH SENSITIVITY)
Troponin I (High Sensitivity): 2432 ng/L (ref <18)
Troponin I (High Sensitivity): 3005 ng/L (ref <18)
Troponin I (High Sensitivity): 5541 ng/L (ref <18)

## 2024-02-10 LAB — GLUCOSE, CAPILLARY: Glucose-Capillary: 178 mg/dL — ABNORMAL HIGH (ref 70–99)

## 2024-02-10 LAB — STREP PNEUMONIAE URINARY ANTIGEN: Strep Pneumo Urinary Antigen: NEGATIVE

## 2024-02-10 LAB — HEPARIN LEVEL (UNFRACTIONATED): Heparin Unfractionated: 0.25 [IU]/mL — ABNORMAL LOW (ref 0.30–0.70)

## 2024-02-10 SURGERY — LEFT HEART CATH AND CORONARY ANGIOGRAPHY
Anesthesia: Moderate Sedation

## 2024-02-10 MED ORDER — LOSARTAN POTASSIUM 50 MG PO TABS
50.0000 mg | ORAL_TABLET | Freq: Every day | ORAL | Status: DC
Start: 2024-02-10 — End: 2024-02-10
  Filled 2024-02-10: qty 1

## 2024-02-10 MED ORDER — MORPHINE SULFATE (PF) 2 MG/ML IV SOLN
2.0000 mg | INTRAVENOUS | Status: DC | PRN
  Administered 2024-02-11: 2 mg via INTRAVENOUS
  Filled 2024-02-10: qty 1

## 2024-02-10 MED ORDER — HEPARIN BOLUS VIA INFUSION
3000.0000 [IU] | Freq: Once | INTRAVENOUS | Status: AC
  Administered 2024-02-10: 3000 [IU] via INTRAVENOUS
  Filled 2024-02-10: qty 3000

## 2024-02-10 MED ORDER — ASPIRIN 325 MG PO TABS
325.0000 mg | ORAL_TABLET | Freq: Once | ORAL | Status: DC
  Filled 2024-02-10: qty 1

## 2024-02-10 MED ORDER — INSULIN ASPART 100 UNIT/ML IJ SOLN
0.0000 [IU] | INTRAMUSCULAR | Status: DC
  Administered 2024-02-10 (×3): 3 [IU] via SUBCUTANEOUS
  Administered 2024-02-10: 15 [IU] via SUBCUTANEOUS
  Administered 2024-02-10 – 2024-02-11 (×2): 3 [IU] via SUBCUTANEOUS
  Administered 2024-02-11 (×2): 2 [IU] via SUBCUTANEOUS
  Administered 2024-02-11: 3 [IU] via SUBCUTANEOUS
  Administered 2024-02-11: 2 [IU] via SUBCUTANEOUS
  Administered 2024-02-12: 3 [IU] via SUBCUTANEOUS
  Administered 2024-02-12 (×2): 5 [IU] via SUBCUTANEOUS
  Administered 2024-02-12 – 2024-02-13 (×6): 2 [IU] via SUBCUTANEOUS
  Filled 2024-02-10: qty 3
  Filled 2024-02-10 (×2): qty 1
  Filled 2024-02-10: qty 15
  Filled 2024-02-10 (×15): qty 1

## 2024-02-10 MED ORDER — CLONIDINE HCL 0.1 MG PO TABS
0.2000 mg | ORAL_TABLET | Freq: Every day | ORAL | Status: DC
  Administered 2024-02-10: 0.2 mg via ORAL
  Filled 2024-02-10: qty 2

## 2024-02-10 MED ORDER — ASPIRIN 81 MG PO CHEW
324.0000 mg | CHEWABLE_TABLET | Freq: Once | ORAL | Status: AC
  Administered 2024-02-10: 324 mg via ORAL
  Filled 2024-02-10: qty 4

## 2024-02-10 MED ORDER — METOPROLOL SUCCINATE ER 50 MG PO TB24
50.0000 mg | ORAL_TABLET | Freq: Every morning | ORAL | Status: DC
  Filled 2024-02-10: qty 1

## 2024-02-10 MED ORDER — LACTATED RINGERS IV SOLN: INTRAVENOUS | Status: DC

## 2024-02-10 MED ORDER — ASPIRIN 81 MG PO TBEC
81.0000 mg | DELAYED_RELEASE_TABLET | Freq: Every morning | ORAL | Status: DC
Start: 2024-02-10 — End: 2024-02-10

## 2024-02-10 MED ORDER — SODIUM CHLORIDE 0.9 % IV SOLN
100.0000 mg | Freq: Two times a day (BID) | INTRAVENOUS | Status: AC
  Administered 2024-02-10 – 2024-02-12 (×5): 100 mg via INTRAVENOUS
  Filled 2024-02-10 (×7): qty 100

## 2024-02-10 MED ORDER — SODIUM CHLORIDE 0.9 % IV BOLUS
500.0000 mL | Freq: Once | INTRAVENOUS | Status: AC
  Administered 2024-02-10: 500 mL via INTRAVENOUS

## 2024-02-10 MED ORDER — ACETAMINOPHEN 325 MG PO TABS: 650.0000 mg | ORAL_TABLET | Freq: Four times a day (QID) | ORAL | Status: DC | PRN

## 2024-02-10 MED ORDER — OXYCODONE HCL 5 MG PO TABS
5.0000 mg | ORAL_TABLET | ORAL | Status: DC | PRN
  Administered 2024-02-11 – 2024-02-12 (×2): 5 mg via ORAL
  Filled 2024-02-10 (×2): qty 1

## 2024-02-10 MED ORDER — ASPIRIN 81 MG PO TBEC
81.0000 mg | DELAYED_RELEASE_TABLET | Freq: Every day | ORAL | Status: DC
  Administered 2024-02-11 – 2024-02-13 (×3): 81 mg via ORAL
  Filled 2024-02-10 (×3): qty 1

## 2024-02-10 MED ORDER — HEPARIN BOLUS VIA INFUSION
800.0000 [IU] | Freq: Once | INTRAVENOUS | Status: AC
  Administered 2024-02-10: 800 [IU] via INTRAVENOUS
  Filled 2024-02-10: qty 800

## 2024-02-10 MED ORDER — SODIUM CHLORIDE 0.9 % IV SOLN
2.0000 g | INTRAVENOUS | Status: DC
  Administered 2024-02-10 – 2024-02-13 (×4): 2 g via INTRAVENOUS
  Filled 2024-02-10 (×4): qty 20

## 2024-02-10 MED ORDER — PANTOPRAZOLE SODIUM 40 MG IV SOLR
40.0000 mg | Freq: Every day | INTRAVENOUS | Status: DC
  Administered 2024-02-10 – 2024-02-12 (×4): 40 mg via INTRAVENOUS
  Filled 2024-02-10 (×4): qty 10

## 2024-02-10 MED ORDER — ROSUVASTATIN CALCIUM 10 MG PO TABS
20.0000 mg | ORAL_TABLET | Freq: Every day | ORAL | Status: DC
  Administered 2024-02-11 – 2024-02-13 (×3): 20 mg via ORAL
  Filled 2024-02-10 (×3): qty 2

## 2024-02-10 MED ORDER — PRAVASTATIN SODIUM 20 MG PO TABS
20.0000 mg | ORAL_TABLET | Freq: Every day | ORAL | Status: DC
Start: 2024-02-10 — End: 2024-02-10

## 2024-02-10 MED ORDER — HEPARIN SODIUM (PORCINE) 5000 UNIT/ML IJ SOLN
5000.0000 [IU] | Freq: Three times a day (TID) | INTRAMUSCULAR | Status: DC
  Administered 2024-02-10: 5000 [IU] via SUBCUTANEOUS
  Filled 2024-02-10: qty 1

## 2024-02-10 MED ORDER — METOPROLOL SUCCINATE ER 50 MG PO TB24
50.0000 mg | ORAL_TABLET | Freq: Every morning | ORAL | Status: DC
Start: 2024-02-10 — End: 2024-02-11
  Administered 2024-02-10: 50 mg via ORAL

## 2024-02-10 MED ORDER — HYDRALAZINE HCL 20 MG/ML IJ SOLN: 10.0000 mg | INTRAMUSCULAR | Status: DC | PRN

## 2024-02-10 MED ORDER — AMLODIPINE BESYLATE 5 MG PO TABS
5.0000 mg | ORAL_TABLET | Freq: Every day | ORAL | Status: DC
Start: 2024-02-10 — End: 2024-02-10

## 2024-02-10 MED ORDER — TRIMETHOBENZAMIDE HCL 100 MG/ML IM SOLN
200.0000 mg | Freq: Three times a day (TID) | INTRAMUSCULAR | Status: DC | PRN
  Administered 2024-02-10: 200 mg via INTRAMUSCULAR
  Filled 2024-02-10 (×2): qty 2

## 2024-02-10 MED ORDER — HEPARIN (PORCINE) 25000 UT/250ML-% IV SOLN
850.0000 [IU]/h | INTRAVENOUS | Status: DC
  Administered 2024-02-10: 650 [IU]/h via INTRAVENOUS
  Administered 2024-02-11: 750 [IU]/h via INTRAVENOUS
  Filled 2024-02-10 (×2): qty 250

## 2024-02-10 MED ORDER — ACETAMINOPHEN 650 MG RE SUPP: 650.0000 mg | Freq: Four times a day (QID) | RECTAL | Status: DC | PRN

## 2024-02-10 MED ORDER — LOSARTAN POTASSIUM 50 MG PO TABS
50.0000 mg | ORAL_TABLET | Freq: Every day | ORAL | Status: DC
Start: 2024-02-10 — End: 2024-02-10
  Administered 2024-02-10: 50 mg via ORAL

## 2024-02-10 MED ORDER — LABETALOL HCL 5 MG/ML IV SOLN: 5.0000 mg | INTRAVENOUS | Status: DC | PRN

## 2024-02-10 MED ORDER — SODIUM CHLORIDE 0.9 % IV BOLUS: 1000.0000 mL | Freq: Once | INTRAVENOUS | Status: DC

## 2024-02-10 MED ORDER — SODIUM CHLORIDE 0.9 % IV SOLN
12.5000 mg | Freq: Four times a day (QID) | INTRAVENOUS | Status: DC | PRN
  Administered 2024-02-10 – 2024-02-12 (×2): 12.5 mg via INTRAVENOUS
  Filled 2024-02-10 (×2): qty 12.5

## 2024-02-10 MED ORDER — INSULIN GLARGINE 100 UNIT/ML ~~LOC~~ SOLN
8.0000 [IU] | Freq: Every day | SUBCUTANEOUS | Status: DC
Start: 2024-02-10 — End: 2024-02-13
  Administered 2024-02-10 – 2024-02-12 (×4): 8 [IU] via SUBCUTANEOUS
  Filled 2024-02-10 (×6): qty 0.08

## 2024-02-10 MED ORDER — SODIUM CHLORIDE 0.9 % IV BOLUS: 500.0000 mL | Freq: Once | INTRAVENOUS | Status: DC

## 2024-02-10 NOTE — ED Notes (Signed)
 On the phone with hospitalist. Requesting PRN meds for agitation. Hospitalist stating that ED CN can order a 1:1 sitter. Asking for medication as well. Hospitalist is reviewing chat and PTA medications.

## 2024-02-10 NOTE — Consult Note (Cosign Needed Addendum)
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 02/10/2024: last 24h vitals tele labs imaging I/O ED provider note, admission H&P.  Principal Problem:   Intractable nausea and vomiting Active Problems:   Hypertension associated with diabetes (HCC)   Type 2 diabetes mellitus with hyperglycemia, with long-term current use of insulin  (HCC)   ACS (acute coronary syndrome) (HCC)   NSTEMI (non-ST elevated myocardial infarction) (HCC)    ASSESSMENT AND PLAN:  ONEAL Tapia is a 83 y.o. female  from [place] with a past medical history of dementia, report hx of tobacco use, hypertension, CKD 3B, type 2 diabetes mellitus who presented to the ED on 02/09/2024 for persistent nausea and vomiting and back pain. Patient denies any chest pain. Patient denies any history of CAD, MI or HF.  Cardiology was consulted for further evaluation.   # NSTEMI # Takotsubo Cardiomyopathy # Acute HFrEF (EF 20-25%) # Lactic acidosis  # Hypertension # Nausea/Vomiting  # CKD 3b # Dementia  Patient denies chest pain. EKG without acute ischemic changes. Troponins elevated and trending 70 > 90 > 2400 > 3000. Echo revealed newly reduced EF 20-25% with  apical ballooning with hyperkinetic bases consistent with Takotsubo CM, grade II diastolic dysfunction.  -PCCM consulted. Appreciate recommendations.  -Continue to trend troponins until peaked.  -Recommend trending lactic acid until < 2.  -Continue IV heparin  infusion for 48-72 hours for medical management.  -s/p ASA 325 mg. Continue aspirin  81 mg -Ordered Crestor  20 mg daily.  -Continue p.o. metoprolol  succinate 50 mg daily. -Continue losartan  50 mg daily. -Will plan to optimize GDMT as BP and renal function allows.  -Discontinue home amlodipine  with patients newly reduced EF.  -Discussed R/ LHC vs medical management with patient's POA. With patients baseline dementia, CKD with GFR of 35 and poor baseline quality of life, unable to take care of herself its reasonable to treat patient conservatively with medical management at this time.   This patient's plan of care was discussed and created with Dr. Custovic and she is in agreement.  Signed: Dorene Comfort, PA-C  02/10/2024, 12:47 PM [facility]

## 2024-02-10 NOTE — Progress Notes (Signed)
 PROGRESS NOTE Joy Tapia  FMW:[id number] DOB: 02-Oct-1940 DOA: 02/09/2024 PCP: [facility]  Brief Narrative/Hospital Course: 83 y.o. female with medical history significant of  T2DM, HTN, CKDIIIb, dementia sent from assisted living facility to ED for evaluation of nausea and vomiting X 1 DAY.She denies abdominal pain, fevers, SOB.  She has chronic diarrhea which she reports is at baseline.  Since she has been vomiting, she has developed non radiating substernal  chest pain and worsening of her chronic low back pain.  For EMS her blood sugar was 130 In the ED, she was hypertensive up to 226/89, afebrile  saturating appropriately on room air.  Slight leukocytosis of 13.7, lactic acid 3.3.  Initial troponin 77>98, EKG nonischemic and stable compared to prior.  COVID flu and RSV are negative. CT head without acute abnormality. CTA without evidence of PE, focal infiltrate, or edema.No ECHO or ischemic cardiac evaluation available for review No improvement in chest pain with SL nitroglycerin , and n/v persistent despite IV zofran  and reglan  Further labs labs showed blood glucose: 446> 221, anion gap 19 with bicarb 16.  CBC w/ wbc 17.7.'  patient has been agitated, trying to get out of the bed and confused. HR easily goes up on moving. Blood culture has been sent, CT abdomen pelvis pending. Repeat labs including lactic acid has been ordered.  Spoke with POA Donny and Rogene. As per Fredick she was throwing for 2 days and chest pain last night as ALF and had chest pain and other time indigestion. Has been on PPI and has been off mid August and on Pepcid till Aug 28. She knows her form Tommi and was living in bad situation and they moved her  and and now living in ALF since August They don't know her medical history She does have dementia moderate to severe per POA based on her assessment.  Subjective: Seen and examined today Having lots of nauseas and feeling  sick aaox3 Uncomfortable BP and HR high earlier- now ins 170s  Assessment and plan:  Elevated troponin Persistent nausea Concern for ACS initially NSTEMI suspected: New onset systolic CHF-Takotsubo cardiomyopathy CT angio negative, EKG nonischemic, duplex of lower extremities no DVT. EKG finding concerning for ischemic changes this morning patient with persistent nausea symptoms has bump in troponin 70s>90s>2432, lactic acid trending up, EKG with some changes, repeat EKG ordered Urgently informed cardiology, discussed and starting aspirin  325 x 1 and heparin  DRIP, keeping n.p.o. Continue antiemetics, gentle IV fluids, echo has been ordered-being done at bedside. PTA on asa 81- on hold. Cont home pravastatin , metoprolol  50, losartan  50 and clonidine  Blood pressure becoming soft, holding off on further antihypertensive Echo subsequently resulted EF 20-25% severely decreased LV function RV systolic function is normal Since lactic acid trending up, moving to stepdown  HTN urgency Tachycardia: On multiple home meds amlodipine  5, losartan , metoprolol  and clonidine .  Continue as BP allows now with low EF holding off'  Nausea and vomiting Diarrhea Ketonuria: Abdominal exam benign,CT abdomen was pending> subsequently resulted without acute finding on abdomen LFTs lipase normal.Continue supportive care  Agitation Dementia at baseline: CT head negative, abdominal exam benign. COVID/flu/RSV negative.  No obvious infection although has leukocytosis lactic acidosis She is alert awake oriented x 3. Prn sitter  Leukocytosis: Chest x-ray/CT chest angio no pneumonia.  Patient's UA is unremarkable for UTI, COVID RSV influenza has been negative. Could be in the setting of 1 versus reactive, no obvious infection.  Blood culture has been sent earlier, an addendum  CT abdomen showed patchy ground glass and confluent airspace disease in both lower lungs with bronchial wall thickening-starting empiric  ceftriaxone  doxycycline  for CAP coverage> sending urinary antigens if cultures antigens  Lactic acidosis likely from new onset CHF Concern for multifocal pneumonia based on CT abdomen vs fluid overload: Unclear etiology could be in the setting of #1-w/ MI vs from intractable nausea vomiting and hyperglycemia Patient received IV fluids continue IV fluids,  lactate 3.3> 4.6 earlier in am - recheck down to 3.8, continue IV fluids trend lactic acid  check procalcitonin. Recent Labs  Lab 02/10/24 0643 02/10/24 0806 02/10/24 1050  LATICACIDVEN 4.6* 3.8* 4.1*  PROCALCITON  --  0.10  --      T2DM with uncontrolled hyperglycemia: Blood sugar earlier in 427> down to 211 this morning, of note patient was given D5 LR x 1- repeat BMP ordered for anion gap, bHOB slightly up we will recheck. PTA on glipizide, Semglee  22 units bedtime and 8 units Premeal Novolog .  Blood sugar now on lab 196 improving Holding OHA, continue on sliding insulin  and  low dose semglee  8 u, SSI 0 to 15 units Recent Labs  Lab 02/10/24 0134 02/10/24 0259 02/10/24 0612 02/10/24 0758 02/10/24 1149  GLUCAP 446*  --  211* 196* 199*  HGBA1C  --  9.0*  --   --   --     CKD3b: Baseline creatinine has been in the range of 1.2-1.5 from May, monitor. hold lasix  until tolerating PO. On IV fluids, nephrology consulted  HLD: Cont  pravastatin  as able.  Chronic opiate use Chronic back pain: On chronic oxycodone  5 mg q 6 hr prn  Incidental left thyroid  nodule 1.9 cm: Advised for nonemergent thyroid  ultrasound  Scattered parenchymal nodules throughout the lungs: Largest 3 mm no follow-up needed for low risk  Addendum at 1:05 pm: Patient blood pressure is soft, lactic acid trending up, discussed and consulted ICU team as well as  cardiology team.  If remains hypotensive may need inotropes/pressors, moving to stepdown.  Trending of lactic acid again if continues to increase from current 4, ICU considering for line placement and  pressors/inotropes  Signout to team on [facility] for coverage Patient remains critically ill, at risk of decompensation  DVT prophylaxis: Heparin  Code Status:   Code Status: Full Code Family Communication: plan of care discussed with patient at bedside. I called her POA friend Kahdijah and Donny to update  and is aware about her acute illness Patient status is: Remains hospitalized because of severity of illness Level of care: Stepdown   Dispo: The patient is from: [facility].            Anticipated disposition: TBD Objective: Vitals last 24 hrs: Vitals:   02/10/24 1215 02/10/24 1230 02/10/24 1239 02/10/24 1242  BP: (!) 101/55 (!) 77/59  105/66  Pulse: 82 92    Resp: 20 18    Temp:   98.4 F (36.9 C)   TempSrc:   Oral   SpO2: 96% 98%    Weight:      Height:        Physical Examination: General exam: alert awake, oriented to place people, older than stated age, sick looking HEENT:Oral mucosa moist, Ear/Nose WNL grossly Respiratory system: Bilaterally diminished breath sounds,no use of accessory muscle Cardiovascular system: S1 & S2 +, No JVD. Gastrointestinal system: Abdomen soft,NT,ND, BS+ Nervous System: Alert, awake, moving all extremities,and following commands. Extremities: extremities warm, leg edema negative Skin: No rashes,no icterus. MSK: Normal muscle bulk,tone, power.  Medications reviewed:  Scheduled Meds:  [START ON 02/11/2024] aspirin  EC  81 mg Oral Daily   insulin  aspart  0-15 Units Subcutaneous Q4H   insulin  glargine  8 Units Subcutaneous QHS   metoprolol  succinate  50 mg Oral q morning   pantoprazole  (PROTONIX ) IV  40 mg Intravenous QHS   [START ON 02/11/2024] rosuvastatin   20 mg Oral Daily   Continuous Infusions:  cefTRIAXone  (ROCEPHIN )  IV Stopped (02/10/24 1306)   doxycycline  (VIBRAMYCIN ) IV     heparin  650 Units/hr (02/10/24 0852)   lactated ringers  Stopped (02/10/24 1300)   promethazine  (PHENERGAN ) injection (IM or IVPB) Stopped (02/10/24 9364)    Diet: Diet Order             Diet NPO time specified Except for: Sips with Meds  Diet effective midnight           Diet Heart Room service appropriate? Yes; Fluid consistency: Thin  Diet effective now                   Data Reviewed: I have personally reviewed following labs and imaging studies ( see epic result tab) CBC: Recent Labs  Lab 02/09/24 1851 02/10/24 0259  WBC 13.7* 17.7*  HGB 14.4 14.3  HCT 44.6 42.8  MCV 83.2 80.6  PLT 303 352   CMP: Recent Labs  Lab 02/09/24 1851 02/10/24 0159 02/10/24 0806  NA 137 133* 139  K 4.6 4.5 3.9  CL 103 98 100  CO2 17* 16* 19*  GLUCOSE 178* 427* 216*  BUN 32* 26* 25*  CREATININE 1.40* 1.40* 1.49*  CALCIUM  9.2 9.2 9.6   GFR: Estimated Creatinine Clearance: 22.7 mL/min (A) (by C-G formula based on SCr of 1.49 mg/dL (H)). Recent Labs  Lab 02/09/24 1851 02/10/24 0159  AST 62* 53*  ALT 36 36  ALKPHOS 111 111  BILITOT 1.1 1.0  PROT 7.4 7.5  ALBUMIN 4.0 3.8    Recent Labs  Lab 02/09/24 1851  LIPASE 50   No results for input(s): AMMONIA in the last 168 hours. Coagulation Profile: No results for input(s): INR, PROTIME in the last 168 hours. Unresulted Labs (From admission, onward)     Start     Ordered   02/11/24 0500  Heparin  level (unfractionated)  Daily,   R      02/10/24 0840   02/11/24 0500  CBC  Daily,   R      02/10/24 0840   02/11/24 0500  Comprehensive metabolic panel with GFR  Daily,   R      02/10/24 0852   02/11/24 0500  CBC  Daily,   R      02/10/24 0852   02/11/24 0500  Lipid panel  Tomorrow morning,   R        02/10/24 0944   02/10/24 1700  Heparin  level (unfractionated)  Once-Timed,   TIMED        02/10/24 0839   02/10/24 1305  Lactic acid, plasma  (Lactic Acid)  ONCE - URGENT,   URGENT        02/10/24 1304   02/10/24 1159  Strep pneumoniae urinary antigen  Once,   R        02/10/24 1200   02/10/24 1159  Legionella Pneumophila Serogp 1 Ur Ag  Once,   R        02/10/24 1200    02/10/24 1159  Expectorated Sputum Assessment w Gram Stain, Rflx to Resp Cult  Once,   R  02/10/24 1200           Antimicrobials/Microbiology: Anti-infectives (From admission, onward)    Start     Dose/Rate Route Frequency Ordered Stop   02/10/24 1400  doxycycline  (VIBRAMYCIN ) 100 mg in sodium chloride  0.9 % 250 mL IVPB        100 mg 125 mL/hr over 120 Minutes Intravenous Every 12 hours 02/10/24 1200 02/13/24 0159   02/10/24 1230  cefTRIAXone  (ROCEPHIN ) 2 g in sodium chloride  0.9 % 100 mL IVPB        2 g 200 mL/hr over 30 Minutes Intravenous Every 24 hours 02/10/24 1200 02/15/24 1229         Component Value Date/Time   SDES BLOOD LEFT ARM 02/10/2024 0159   SDES BLOOD LEFT HAND 02/10/2024 0159   SPECREQUEST  02/10/2024 0159    BOTTLES DRAWN AEROBIC ONLY Blood Culture results may not be optimal due to an inadequate volume of blood received in culture bottles   SPECREQUEST  02/10/2024 0159    BOTTLES DRAWN AEROBIC ONLY Blood Culture results may not be optimal due to an inadequate volume of blood received in culture bottles   CULT  02/10/2024 0159    NO GROWTH < 12 HOURS Performed at [facility], [address] [place], [place] [address]    CULT  02/10/2024 0159    NO GROWTH < 12 HOURS Performed at [facility], [address] [place] [place], [place] [address]    REPTSTATUS PENDING 02/10/2024 0159   REPTSTATUS PENDING 02/10/2024 0159    Procedures: Procedure(s) (LRB): LEFT HEART CATH AND CORONARY ANGIOGRAPHY (N/A)  CRITICAL CARE Performed by: Mennie LAMY   Total critical care time: 45 minutes  Critical care time was exclusive of separately billable procedures and treating other patients.  Critical care was necessary to treat or prevent imminent or life-threatening deterioration.  Critical care was time spent personally by me on the following activities: development of treatment plan with patient and/or surrogate as well as nursing, discussions  with consultants, evaluation of patient's response to treatment, examination of patient, obtaining history from patient or surrogate, ordering and performing treatments and interventions, ordering and review of laboratory studies, ordering and review of radiographic studies, pulse oximetry and re-evaluation of patient's condition.   Mennie LAMY, MD [facility] 02/10/2024, 1:10 PM

## 2024-02-10 NOTE — ED Notes (Signed)
 Per cardiology PA, L heart cath today at 1130 and PO meds OK.

## 2024-02-10 NOTE — Progress Notes (Signed)
 PHARMACY - ANTICOAGULATION CONSULT NOTE  Pharmacy Consult for heparin  Indication: chest pain/ACS  Allergies  Allergen Reactions   Atorvastatin Diarrhea    Allergy not listed on MAR    Azithromycin Other (See Comments)    Pt states burns her insides. Allergy not listed on MAR     Erythromycin Other (See Comments)    Unknown. Allergy not listed on MAR    Other Other (See Comments)    Adhesive bandage, Anesthesia Extension set  -  unknown. Allergy not listed on MAR      Propoxyphene Other (See Comments)    Allergy not listed on [facility]      Patient Measurements: Height: 4' 11 (149.9 cm) Weight: 59 kg (130 lb) IBW/kg (Calculated) : 43.2 HEPARIN  DW (KG): 55.5  Vital Signs: Temp: 98.6 F (37 C) (09/16 1700) Temp Source: Oral (09/16 1239) BP: 122/84 (09/16 1700) Pulse Rate: 88 (09/16 1700)  Labs: Recent Labs    02/09/24 1851 02/09/24 1909 02/09/24 1955 02/10/24 0159 02/10/24 0259 02/10/24 0643 02/10/24 0806 02/10/24 1701  HGB 14.4  --   --   --  14.3  --   --   --   HCT 44.6  --   --   --  42.8  --   --   --   PLT 303  --   --   --  352  --   --   --   HEPARINUNFRC  --   --   --   --   --   --   --  0.25*  CREATININE 1.40*  --   --  1.40*  --   --  1.49*  --   TROPONINIHS  --    < > 98*  --   --  2,432* 3,005*  --    < > = values in this interval not displayed.    Estimated Creatinine Clearance: 22.7 mL/min (A) (by C-G formula based on SCr of 1.49 mg/dL (H)).   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  Patient is not on anticoagulation prior to admission but received heparin  subcutaneous DVT prophylaxis 9/16 @ 0202.  CBC WNL Trop 2,432  0916 1701 HL 0.25- subtherapeutic   Goal of Therapy:  Heparin  level 0.3-0.7 units/ml Monitor platelets by anticoagulation protocol: Yes   Plan:  Heparin  level subtherapeutic  Give 800 unit bolus x 1 Increase heparin  infusion rate to 750 units/hr Check heparin  level in 8 hours Monitor CBC daily while on heparin   Lum Mania, PharmD Clinical Pharmacist 02/10/2024 5:47 PM

## 2024-02-10 NOTE — ED Notes (Signed)
 Cardiology PA and this RN did verbal consent with family member for heart cath. PA taking the paper consent to the cath lab.

## 2024-02-10 NOTE — ED Notes (Signed)
 Calling lab to verify that STAT labs were drawn. Hospitalist at bedside.

## 2024-02-10 NOTE — ED Notes (Signed)
 Lab called: lactic is 4.6 and (3rd) troponin is 2,432 and both were drawn at 0643. Hospitalist paged again to inform of troponin results.

## 2024-02-10 NOTE — ED Notes (Signed)
 Sent med message for Tigan .
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
## 2024-02-10 NOTE — Consult Note (Signed)
 [facility] Consult Note: 02/10/24     Date of Admission:  02/09/2024           Reason for Consult:  CKD   Referring Provider: Christobal Guadalajara, MD Primary Care Provider: [facility]   History of Presenting Illness:  Joy Tapia is a 83 y.o. female with medical problems of diabetes, hypertension, dementia, CKD.  She is a resident of [facility] assisted [facility].  Patient is not able to provide detailed information.  Her power of attorney, who is her friend, Ms. Donny is in the room with her and is able to provide information. She presented for nausea, vomiting, shortness of breath.  Workup in the ER showed that she has increasing lower extremity edema.  Blood pressure initially was elevated at 205/103.  She was found to have elevated troponins and elevated lactic acid.  She is diagnosed with non-STEMI.  Initial plan was to obtain right and left heart cath but because of baseline dementia, conservative management is opted for at this time. Nephrology consult has been requested to evaluate CKD. Review of records show that her baseline creatinine appears to be 1.2-1.4 She underwent a CT angiogram of the chest on 02/09/2024 which was negative for PE. CT abdomen and pelvis without contrast Shows interval development of patchy ground glass airspace disease suggesting infectious or inflammatory etiology possibly multifocal pneumonia.    Review of Systems: ROS-not available due to dementia  Past Medical History:  Diagnosis Date   Deep vein thrombosis (DVT) (HCC)    Dementia (HCC)    Diabetes mellitus without complication (HCC)    Foot fracture, right    GERD (gastroesophageal reflux disease)    Gout    Hoarding disorder    Hyperlipidemia    Hypertension    Hypertension in stage 4 chronic kidney disease due to type 2 diabetes mellitus (HCC)    Intractable nausea and vomiting 02/10/2024   Migraines    Mild cognitive impairment    Non-compliance     Osteoarthritis    Osteoporosis, post-menopausal    Pneumonia     Social History   Tobacco Use   Smoking status: Former    Types: Cigarettes   Smokeless tobacco: Never  Vaping Use   Vaping status: Never Used  Substance Use Topics   Alcohol use: Not Currently   Drug use: Never    History reviewed. No pertinent family history.   OBJECTIVE: Blood pressure 118/72, pulse 84, temperature 98.4 F (36.9 C), temperature source Oral, resp. rate 17, height 4' 11 (1.499 m), weight 59 kg, SpO2 95%.  Physical Exam General Appearance-no acute distress, laying in the bed HEENT-moist oral mucous membranes, anicteric sclera Pulmonary-normal breathing effort, coarse breath sounds at bases Cardiac-irregular rhythm, no rub Abdomen-soft, nondistended Extremities-1+ pitting edema present bilaterally Skin-normal turgor, no acute rashes Neuro-alert and able to follow simple commands   Lab Results Lab Results  Component Value Date   WBC 17.7 (H) 02/10/2024   HGB 14.3 02/10/2024   HCT 42.8 02/10/2024   MCV 80.6 02/10/2024   PLT 352 02/10/2024    Lab Results  Component Value Date   CREATININE 1.49 (H) 02/10/2024   BUN 25 (H) 02/10/2024   NA 139 02/10/2024   K 3.9 02/10/2024   CL 100 02/10/2024   CO2 19 (L) 02/10/2024    Lab Results  Component Value Date   ALT 36 02/10/2024   AST 53 (H) 02/10/2024   ALKPHOS 111 02/10/2024   BILITOT 1.0 02/10/2024  Microbiology: Recent Results (from the past 240 hours)  Resp panel by RT-PCR (RSV, Flu A&B, Covid) Anterior Nasal Swab     Status: None   Collection Time: 02/09/24  7:16 PM   Specimen: Anterior Nasal Swab  Result Value Ref Range Status   SARS Coronavirus 2 by RT PCR NEGATIVE NEGATIVE Final    Comment: (NOTE) SARS-CoV-2 target nucleic acids are NOT DETECTED.  The SARS-CoV-2 RNA is generally detectable in upper respiratory specimens during the acute phase of infection. The lowest concentration of SARS-CoV-2 viral copies this  assay can detect is 138 copies/mL. A negative result does not preclude SARS-Cov-2 infection and should not be used as the sole basis for treatment or other patient management decisions. A negative result may occur with  improper specimen collection/handling, submission of specimen other than nasopharyngeal swab, presence of viral mutation(s) within the areas targeted by this assay, and inadequate number of viral copies(<138 copies/mL). A negative result must be combined with clinical observations, patient history, and epidemiological information. The expected result is Negative.  Fact Sheet for Patients:  BloggerCourse.com  Fact Sheet for Healthcare Providers:  SeriousBroker.it  This test is no t yet approved or cleared by the [place]  FDA and  has been authorized for detection and/or diagnosis of SARS-CoV-2 by FDA under an Emergency Use Authorization (EUA). This EUA will remain  in effect (meaning this test can be used) for the duration of the COVID-19 declaration under Section 564(b)(1) of the Act, 21 U.S.C.section 360bbb-3(b)(1), unless the authorization is terminated  or revoked sooner.       Influenza A by PCR NEGATIVE NEGATIVE Final   Influenza B by PCR NEGATIVE NEGATIVE Final    Comment: (NOTE) The Xpert Xpress SARS-CoV-2/FLU/RSV plus assay is intended as an aid in the diagnosis of influenza from Nasopharyngeal swab specimens and should not be used as a sole basis for treatment. Nasal washings and aspirates are unacceptable for Xpert Xpress SARS-CoV-2/FLU/RSV testing.  Fact Sheet for Patients: BloggerCourse.com  Fact Sheet for Healthcare Providers: SeriousBroker.it  This test is not yet approved or cleared by the [place]  FDA and has been authorized for detection and/or diagnosis of SARS-CoV-2 by FDA under an Emergency Use Authorization (EUA). This EUA will  remain in effect (meaning this test can be used) for the duration of the COVID-19 declaration under Section 564(b)(1) of the Act, 21 U.S.C. section 360bbb-3(b)(1), unless the authorization is terminated or revoked.     Resp Syncytial Virus by PCR NEGATIVE NEGATIVE Final    Comment: (NOTE) Fact Sheet for Patients: BloggerCourse.com  Fact Sheet for Healthcare Providers: SeriousBroker.it  This test is not yet approved or cleared by the [place]  FDA and has been authorized for detection and/or diagnosis of SARS-CoV-2 by FDA under an Emergency Use Authorization (EUA). This EUA will remain in effect (meaning this test can be used) for the duration of the COVID-19 declaration under Section 564(b)(1) of the Act, 21 U.S.C. section 360bbb-3(b)(1), unless the authorization is terminated or revoked.  Performed at [facility], [address] [place], [place] [address]   Culture, blood (routine x 2)     Status: None (Preliminary result)   Collection Time: 02/10/24  1:59 AM   Specimen: BLOOD LEFT ARM  Result Value Ref Range Status   Specimen Description BLOOD LEFT ARM  Final   Special Requests   Final    BOTTLES DRAWN AEROBIC ONLY Blood Culture results may not be optimal due to an inadequate volume of blood received  in culture bottles   Culture   Final    NO GROWTH < 12 HOURS Performed at [facility] [address] [place], [place] [address]    Report Status PENDING  Incomplete  Culture, blood (routine x 2)     Status: None (Preliminary result)   Collection Time: 02/10/24  1:59 AM   Specimen: BLOOD LEFT HAND  Result Value Ref Range Status   Specimen Description BLOOD LEFT HAND  Final   Special Requests   Final    BOTTLES DRAWN AEROBIC ONLY Blood Culture results may not be optimal due to an inadequate volume of blood received in culture bottles   Culture   Final    NO GROWTH < 12 HOURS Performed at [facility], [address] [place], [place] [address]    Report Status PENDING  Incomplete    Medications: Scheduled Meds:  [START ON 02/11/2024] aspirin  EC  81 mg Oral Daily   insulin  aspart  0-15 Units Subcutaneous Q4H   insulin  glargine  8 Units Subcutaneous QHS   metoprolol  succinate  50 mg Oral q morning   pantoprazole  (PROTONIX ) IV  40 mg Intravenous QHS   [START ON 02/11/2024] rosuvastatin   20 mg Oral Daily   Continuous Infusions:  cefTRIAXone  (ROCEPHIN )  IV Stopped (02/10/24 1306)   doxycycline  (VIBRAMYCIN ) IV     heparin  650 Units/hr (02/10/24 0852)   lactated ringers  Stopped (02/10/24 1300)   promethazine  (PHENERGAN ) injection (IM or IVPB) Stopped (02/10/24 0635)   PRN Meds:.acetaminophen  **OR** acetaminophen , morphine  injection, oxyCODONE , promethazine  (PHENERGAN ) injection (IM or IVPB), trimethobenzamide   Allergies  Allergen Reactions   Atorvastatin Diarrhea    Allergy not listed on MAR    Azithromycin Other (See Comments)    Pt states burns her insides. Allergy not listed on MAR     Erythromycin Other (See Comments)    Unknown. Allergy not listed on MAR    Other Other (See Comments)    Adhesive bandage, Anesthesia Extension set  -  unknown. Allergy not listed on MAR      Propoxyphene Other (See Comments)    Allergy not listed on MAR      Urinalysis: Recent Labs    02/09/24 2338  COLORURINE STRAW*  LABSPEC 1.015  PHURINE 6.0  GLUCOSEU 150*  HGBUR NEGATIVE  BILIRUBINUR NEGATIVE  KETONESUR 5*  PROTEINUR >=300*  NITRITE NEGATIVE  LEUKOCYTESUR NEGATIVE      Imaging: ECHOCARDIOGRAM COMPLETE Result Date: 02/10/2024    ECHOCARDIOGRAM REPORT   Patient Name:   Joy Tapia Date of Exam: 02/10/2024 Medical Rec #:  [id number]           Height:       59.0 in Accession #:    [id number]          Weight:       130.0 lb Date of Birth:  [birth year]           BSA:          1.536 m Patient Age:    83 years            BP:           177/92 mmHg Patient  Gender: F                   HR:           96 bpm. Exam Location:  [facility] Procedure: 2D Echo, Cardiac Doppler and Color Doppler (Both Spectral and Color  Flow Doppler were utilized during procedure). Indications:     Chest Pain R07.9  History:         Patient has no prior history of Echocardiogram examinations.                  Signs/Symptoms:Chest Pain.  Sonographer:     Ashley McNeely-Sloane Referring Phys:  [id number] DAVED BROCKS [facility] Diagnosing Phys: Sabina Custovic IMPRESSIONS  1. Apical ballooning with hyperkinetic bases consistent with Takotsubo cardiomyopathy. Left ventricular ejection fraction, by estimation, is 20 to 25%. The left ventricle has severely decreased function. The left ventricle demonstrates regional wall motion abnormalities (see scoring diagram/findings for description). Left ventricular diastolic parameters are consistent with Grade II diastolic dysfunction (pseudonormalization).  2. Right ventricular systolic function is normal. The right ventricular size is normal.  3. Left atrial size was mildly dilated.  4. The mitral valve is normal in structure. Moderate to severe mitral valve regurgitation. No evidence of mitral stenosis.  5. Tricuspid valve regurgitation is moderate.  6. The aortic valve is normal in structure. Aortic valve regurgitation is not visualized. No aortic stenosis is present.  7. The inferior vena cava is normal in size with greater than 50% respiratory variability, suggesting right atrial pressure of 3 mmHg. FINDINGS  Left Ventricle: Apical ballooning with hyperkinetic bases consistent with Takotsubo cardiomyopathy. Left ventricular ejection fraction, by estimation, is 20 to 25%. The left ventricle has severely decreased function. The left ventricle demonstrates regional wall motion abnormalities. The left ventricular internal cavity size was normal in size. There is no left ventricular hypertrophy. Left ventricular diastolic parameters are consistent with Grade II  diastolic dysfunction (pseudonormalization).  LV Wall Scoring: The apical anterior segment, apical inferior segment, and apex are hypokinetic. Right Ventricle: The right ventricular size is normal. No increase in right ventricular wall thickness. Right ventricular systolic function is normal. Left Atrium: Left atrial size was mildly dilated. Right Atrium: Right atrial size was normal in size. Pericardium: There is no evidence of pericardial effusion. Mitral Valve: The mitral valve is normal in structure. Moderate to severe mitral valve regurgitation. No evidence of mitral valve stenosis. MV peak gradient, 8.8 mmHg. The mean mitral valve gradient is 4.0 mmHg. Tricuspid Valve: The tricuspid valve is normal in structure. Tricuspid valve regurgitation is moderate. Aortic Valve: The aortic valve is normal in structure. Aortic valve regurgitation is not visualized. No aortic stenosis is present. Aortic valve mean gradient measures 4.0 mmHg. Aortic valve peak gradient measures 7.4 mmHg. Aortic valve area, by VTI measures 1.06 cm. Pulmonic Valve: The pulmonic valve was normal in structure. Pulmonic valve regurgitation is not visualized. Aorta: The aortic root is normal in size and structure. Venous: The inferior vena cava is normal in size with greater than 50% respiratory variability, suggesting right atrial pressure of 3 mmHg. IAS/Shunts: No atrial level shunt detected by color flow Doppler.  LEFT VENTRICLE PLAX 2D LVIDd:         4.50 cm     Diastology LVIDs:         3.00 cm     LV e' medial:    5.75 cm/s LV PW:         0.90 cm     LV E/e' medial:  19.8 LV IVS:        0.90 cm     LV e' lateral:   8.70 cm/s LVOT diam:     1.70 cm     LV E/e' lateral: 13.1 LV SV:  23 LV SV Index:   15 LVOT Area:     2.27 cm  LV Volumes (MOD) LV vol d, MOD A4C: 56.7 ml LV vol s, MOD A4C: 48.4 ml LV SV MOD A4C:     56.7 ml RIGHT VENTRICLE             IVC RV Basal diam:  2.80 cm     IVC diam: 1.90 cm RV Mid diam:    2.10 cm RV S  prime:     12.00 cm/s TAPSE (M-mode): 1.6 cm LEFT ATRIUM             Index        RIGHT ATRIUM          Index LA diam:        3.30 cm 2.15 cm/m   RA Area:     9.77 cm LA Vol (A2C):   36.3 ml 23.64 ml/m  RA Volume:   18.20 ml 11.85 ml/m LA Vol (A4C):   35.1 ml 22.85 ml/m LA Biplane Vol: 36.2 ml 23.57 ml/m  AORTIC VALVE                    PULMONIC VALVE AV Area (Vmax):    1.14 cm     PV Vmax:        0.94 m/s AV Area (Vmean):   1.11 cm     PV Vmean:       66.000 cm/s AV Area (VTI):     1.06 cm     PV VTI:         0.147 m AV Vmax:           136.00 cm/s  PV Peak grad:   3.6 mmHg AV Vmean:          93.400 cm/s  PV Mean grad:   2.0 mmHg AV VTI:            0.219 m      RVOT Peak grad: 2 mmHg AV Peak Grad:      7.4 mmHg AV Mean Grad:      4.0 mmHg LVOT Vmax:         68.10 cm/s LVOT Vmean:        45.600 cm/s LVOT VTI:          0.102 m LVOT/AV VTI ratio: 0.47  AORTA Ao Root diam: 2.90 cm Ao Asc diam:  2.80 cm MITRAL VALVE                TRICUSPID VALVE MV Area (PHT): 5.97 cm     TR Peak grad:   55.4 mmHg MV Area VTI:   1.20 cm     TR Mean grad:   41.0 mmHg MV Peak grad:  8.8 mmHg     TR Vmax:        372.00 cm/s MV Mean grad:  4.0 mmHg     TR Vmean:       310.0 cm/s MV Vmax:       1.48 m/s MV Vmean:      92.0 cm/s    SHUNTS MV Decel Time: 127 msec     Systemic VTI:  0.10 m MR Peak grad: 102.4 mmHg    Systemic Diam: 1.70 cm MR Mean grad: 79.0 mmHg     Pulmonic VTI:  0.101 m MR Vmax:      506.00 cm/s MR Vmean:     435.0 cm/s MV E velocity: 114.00 cm/s MV A  velocity: 148.00 cm/s MV E/A ratio:  0.77 Sabina Custovic Electronically signed by Annalee Casa Signature Date/Time: 02/10/2024/12:44:19 PM    Final    CT ABDOMEN PELVIS WO CONTRAST Result Date: 02/10/2024 CLINICAL DATA:  Abdominal pain and vomiting. EXAM: CT ABDOMEN AND PELVIS WITHOUT CONTRAST TECHNIQUE: Multidetector CT imaging of the abdomen and pelvis was performed following the standard protocol without IV contrast. RADIATION DOSE REDUCTION: This exam was  performed according to the departmental dose-optimization program which includes automated exposure control, adjustment of the mA and/or kV according to patient size and/or use of iterative reconstruction technique. COMPARISON:  08/06/2023 FINDINGS: Lower chest: Interval development of patchy ground-glass and confluent airspace disease in both lower lungs with associated bronchial wall thickening and septal prominence. Nodular components measure up to 6 mm. Small bilateral pleural effusions. Hepatobiliary: No suspicious focal abnormality within the liver parenchyma. Nonvisualization of the gallbladder compatible with the reported history of cholecystectomy. No intrahepatic or extrahepatic biliary dilation. Pancreas: No focal mass lesion. No dilatation of the main duct. No intraparenchymal cyst. No peripancreatic edema. Spleen: No splenomegaly. No suspicious focal mass lesion. Adrenals/Urinary Tract: No adrenal nodule or mass. Tiny well-defined homogeneous low-density lesions in both kidneys are too small to characterize but are statistically most likely benign and probably cysts. No followup imaging is recommended. 10 mm hypoattenuating lesion lower pole left kidney is stable since prior and approaches water density, likely a cyst. Excreted contrast material in the renal calices, ureters, and bladder is compatible with CT imaging yesterday. Stomach/Bowel: Stomach is unremarkable. No gastric wall thickening. No evidence of outlet obstruction. Duodenum is normally positioned as is the ligament of Treitz. Duodenal diverticulum noted. No small bowel wall thickening. No small bowel dilatation. The terminal ileum is normal. The appendix is normal. No gross colonic mass. No colonic wall thickening. Vascular/Lymphatic: There is mild atherosclerotic calcification of the abdominal aorta without aneurysm. There is no gastrohepatic or hepatoduodenal ligament lymphadenopathy. No retroperitoneal or mesenteric lymphadenopathy. No  pelvic sidewall lymphadenopathy. Reproductive: There is no adnexal mass. Other: No intraperitoneal free fluid. Musculoskeletal: No worrisome lytic or sclerotic osseous abnormality. Advanced degenerative disc disease noted in the lumbar spine. IMPRESSION: 1. No acute findings in the abdomen or pelvis. Specifically, no findings to explain the patient's history of abdominal pain and vomiting. 2. Interval development of patchy ground-glass and confluent airspace disease in both lower lungs with associated bronchial wall thickening and septal prominence. Nodular components measure up to 6 mm. Imaging features are compatible with infectious/inflammatory etiology. Multifocal pneumonia a distinct consideration. Follow-up CT chest without contrast in 3 months recommended to ensure resolution. 3. Small bilateral pleural effusions. 4.  Aortic Atherosclerosis (ICD10-I70.0). Electronically Signed   By: Camellia Candle M.D.   On: 02/10/2024 11:51   US  Venous Img Lower Bilateral (DVT) Result Date: 02/09/2024 CLINICAL DATA:  Bilateral lower extremity pain and swelling EXAM: BILATERAL LOWER EXTREMITY VENOUS DOPPLER ULTRASOUND TECHNIQUE: Gray-scale sonography with graded compression, as well as color Doppler and duplex ultrasound were performed to evaluate the lower extremity deep venous systems from the level of the common femoral vein and including the common femoral, femoral, profunda femoral, popliteal and calf veins including the posterior tibial, peroneal and gastrocnemius veins when visible. The superficial great saphenous vein was also interrogated. Spectral Doppler was utilized to evaluate flow at rest and with distal augmentation maneuvers in the common femoral, femoral and popliteal veins. COMPARISON:  None Available. FINDINGS: RIGHT LOWER EXTREMITY Common Femoral Vein: No evidence of thrombus. Normal compressibility, respiratory phasicity and  response to augmentation. Saphenofemoral Junction: No evidence of thrombus.  Normal compressibility and flow on color Doppler imaging. Profunda Femoral Vein: No evidence of thrombus. Normal compressibility and flow on color Doppler imaging. Femoral Vein: No evidence of thrombus. Normal compressibility, respiratory phasicity and response to augmentation. Popliteal Vein: No evidence of thrombus. Normal compressibility, respiratory phasicity and response to augmentation. Calf Veins: No evidence of thrombus. Normal compressibility and flow on color Doppler imaging. Superficial Great Saphenous Vein: No evidence of thrombus. Normal compressibility and flow on color Doppler imaging. Venous Reflux:  None. Other Findings:  None. LEFT LOWER EXTREMITY Common Femoral Vein: No evidence of thrombus. Normal compressibility, respiratory phasicity and response to augmentation. Saphenofemoral Junction: No evidence of thrombus. Normal compressibility and flow on color Doppler imaging. Profunda Femoral Vein: No evidence of thrombus. Normal compressibility and flow on color Doppler imaging. Femoral Vein: No evidence of thrombus. Normal compressibility, respiratory phasicity and response to augmentation. Popliteal Vein: No evidence of thrombus. Normal compressibility, respiratory phasicity and response to augmentation. Calf Veins: No evidence of thrombus. Normal compressibility and flow on color Doppler imaging. Superficial Great Saphenous Vein: No evidence of thrombus. Normal compressibility and flow on color Doppler imaging. Venous Reflux:  None. Other Findings:  None. IMPRESSION: No evidence of deep venous thrombosis. Electronically Signed   By: Oneil Devonshire M.D.   On: 02/09/2024 23:39   CT Head Wo Contrast Result Date: 02/09/2024 CLINICAL DATA:  Nausea and vomiting EXAM: CT HEAD WITHOUT CONTRAST TECHNIQUE: Contiguous axial images were obtained from the base of the skull through the vertex without intravenous contrast. RADIATION DOSE REDUCTION: This exam was performed according to the departmental  dose-optimization program which includes automated exposure control, adjustment of the mA and/or kV according to patient size and/or use of iterative reconstruction technique. COMPARISON:  None Available. FINDINGS: Brain: No evidence of acute infarction, hemorrhage, hydrocephalus, extra-axial collection or mass lesion/mass effect. Mild atrophic changes are noted commensurate with the patient's age. Vascular: No hyperdense vessel or unexpected calcification. Skull: Normal. Negative for fracture or focal lesion. Sinuses/Orbits: No acute finding. Other: None. IMPRESSION: Mild atrophic changes without acute abnormality. Electronically Signed   By: Oneil Devonshire M.D.   On: 02/09/2024 22:46   CT Angio Chest PE W/Cm &/Or Wo Cm Result Date: 02/09/2024 CLINICAL DATA:  Nausea and vomiting with shortness of breath, initial encounter EXAM: CT ANGIOGRAPHY CHEST WITH CONTRAST TECHNIQUE: Multidetector CT imaging of the chest was performed using the standard protocol during bolus administration of intravenous contrast. Multiplanar CT image reconstructions and MIPs were obtained to evaluate the vascular anatomy. RADIATION DOSE REDUCTION: This exam was performed according to the departmental dose-optimization program which includes automated exposure control, adjustment of the mA and/or kV according to patient size and/or use of iterative reconstruction technique. CONTRAST:  60mL OMNIPAQUE  IOHEXOL  350 MG/ML SOLN COMPARISON:  Chest x-ray from earlier in the same day. FINDINGS: Cardiovascular: Atherosclerotic calcifications of the thoracic aorta are noted. No aneurysmal dilatation or dissection is seen. The heart is mildly enlarged in size. The pulmonary artery shows a normal branching pattern bilaterally. No filling defect is identified to suggest pulmonary embolism. Mild coronary calcifications are seen. No pericardial effusion is noted. Mediastinum/Nodes: Thoracic inlet shows evidence of a left inferior thyroid  nodule measuring 19  mm with mixed attenuation and heavy calcifications. This extends into the superior mediastinum. No hilar or mediastinal adenopathy is noted. The esophagus as visualized is within normal limits. Lungs/Pleura: Lungs are well aerated bilaterally. No focal infiltrate or sizable effusion is seen.  Scattered parenchymal nodules are noted bilaterally. The largest of these lies within the right middle lobe best seen on image number 63 of series 5 measuring 3 mm in dimension. Upper Abdomen: Visualized upper abdomen shows no acute abnormality. Musculoskeletal: Degenerative changes of the thoracic spine are noted. Review of the MIP images confirms the above findings. IMPRESSION: No evidence of pulmonary emboli. Incidental left thyroid  nodule measuring 1.9 cm. Recommend non-emergent thyroid  ultrasound. Reference: J Am Coll Radiol. 2015 Feb;12(2): 143-50 Scattered parenchymal nodules are noted throughout both lungs. The largest of these measures 3 mm. No follow-up needed if patient is low-risk (and has no known or suspected primary neoplasm). Non-contrast chest CT can be considered in 12 months if patient is high-risk. This recommendation follows the consensus statement: Guidelines for Management of Incidental Pulmonary Nodules Detected on CT Images: From the Fleischner Society 2017; Radiology 2017; 284:228-243. Aortic Atherosclerosis (ICD10-I70.0). Electronically Signed   By: Oneil Devonshire M.D.   On: 02/09/2024 22:45   DG Chest Port 1 View Result Date: 02/09/2024 CLINICAL DATA:  Shortness of breath EXAM: PORTABLE CHEST 1 VIEW COMPARISON:  08/04/2023 FINDINGS: Cardiac shadow is within normal limits. Lungs are well aerated bilaterally. Elevation of the right hemidiaphragm is again seen. No acute infiltrate is noted. No bony abnormality is seen. IMPRESSION: No acute abnormality noted. Electronically Signed   By: Oneil Devonshire M.D.   On: 02/09/2024 19:47      Assessment/Plan:  ALICYA BENA is a 83 y.o. female with  medical problems of diabetes, hypertension, dementia, CKD was admitted on 02/09/2024 for :  Intractable nausea and vomiting [R11.2] ACS (acute coronary syndrome) (HCC) [I24.9] NSTEMI (non-ST elevated myocardial infarction) (HCC) [I21.4]  1.  Chronic kidney disease stage IIIb Patient's renal function is at baseline at present.  However she underwent IV contrast exposure yesterday which may cause worsening of creatinine in the short term. - Continue supportive care and avoid hypotension. - Avoid further IV contrast exposure, nonsteroidals.  2.  Lower extremity edema, acute systolic and diastolic CHF -Based on most recent echo, patient has LVEF 20 to 25%, severely decreased LV function, moderate to severe mitral regurgitation and moderate tricuspid regurgitation.,  Grade 2 diastolic dysfunction - Recommend careful hydration and monitoring to avoid volume overload.   Joy Tapia 02/10/24

## 2024-02-10 NOTE — ED Notes (Signed)
 ED sec is calling for telesitter.

## 2024-02-10 NOTE — ED Notes (Signed)
 Pt removed from bed pan. Pt cleaned up and new brief applied on pt. Pt c/o nausea. Pt given PRN Phenergan  at this time.
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
## 2024-02-10 NOTE — ED Notes (Signed)
 Messaged pharmacy to bring phenergan  to the ED

## 2024-02-10 NOTE — ED Notes (Signed)
 Pt to CT now

## 2024-02-10 NOTE — Hospital Course (Addendum)
 83 y.o. female with medical history significant of  T2DM, HTN, CKDIIIb, dementia sent from assisted living facility to ED for evaluation of nausea and vomiting X 1 DAY.She denies abdominal pain, fevers, SOB.  She has chronic diarrhea which she reports is at baseline.  Since she has been vomiting, she has developed non radiating substernal  chest pain and worsening of her chronic low back pain.  For EMS her blood sugar was 130 In the ED, she was hypertensive up to 226/89, afebrile  saturating appropriately on room air.  Slight leukocytosis of 13.7, lactic acid 3.3.  Initial troponin 77>98, EKG nonischemic and stable compared to prior.  COVID flu and RSV are negative. CT head without acute abnormality. CTA without evidence of PE, focal infiltrate, or edema.No ECHO or ischemic cardiac evaluation available for review No improvement in chest pain with SL nitroglycerin , and n/v persistent despite IV zofran  and reglan  Further labs labs showed blood glucose: 446> 221, anion gap 19 with bicarb 16.  CBC w/ wbc 17.7.'  patient has been agitated, trying to get out of the bed and confused. HR easily goes up on moving. Blood culture has been sent, CT abdomen pelvis pending. Repeat labs including lactic acid has been ordered.  Spoke with POA Donny and Jerzy. As per Fredick she was throwing for 2 days and chest pain last night as ALF and had chest pain and other time indigestion. Has been on PPI and has been off mid August and on Pepcid till Aug 28. She knows her form [facility] and was living in bad situation and they moved her  and and now living in ALF since August They don't know her medical history She does have dementia moderate to severe per POA based on her assessment.  Subjective: Seen and examined today Having lots of nauseas and feeling sick aaox3 Uncomfortable BP and HR high earlier- now ins 170s  Assessment and plan:  Elevated troponin Persistent nausea Concern for ACS initially NSTEMI  suspected: New onset systolic CHF-Takotsubo cardiomyopathy CT angio negative, EKG nonischemic, duplex of lower extremities no DVT. EKG finding concerning for ischemic changes this morning patient with persistent nausea symptoms has bump in troponin 70s>90s>2432, lactic acid trending up, EKG with some changes, repeat EKG ordered Urgently informed cardiology, discussed and starting aspirin  325 x 1 and heparin  DRIP, keeping n.p.o. Continue antiemetics, gentle IV fluids, echo has been ordered-being done at bedside. PTA on asa 81- on hold. Cont home pravastatin , metoprolol  50, losartan  50 and clonidine  Blood pressure becoming soft, holding off on further antihypertensive Echo subsequently resulted EF 20-25% severely decreased LV function RV systolic function is normal Since lactic acid trending up, moving to stepdown  HTN urgency Tachycardia: On multiple home meds amlodipine  5, losartan , metoprolol  and clonidine .  Continue as BP allows now with low EF holding off'  Nausea and vomiting Diarrhea Ketonuria: Abdominal exam benign,CT abdomen was pending> subsequently resulted without acute finding on abdomen LFTs lipase normal.Continue supportive care  Agitation Dementia at baseline: CT head negative, abdominal exam benign. COVID/flu/RSV negative.  No obvious infection although has leukocytosis lactic acidosis She is alert awake oriented x 3. Prn sitter  Leukocytosis: Chest x-ray/CT chest angio no pneumonia.  Patient's UA is unremarkable for UTI, COVID RSV influenza has been negative. Could be in the setting of 1 versus reactive, no obvious infection.  Blood culture has been sent earlier, an addendum CT abdomen showed patchy ground glass and confluent airspace disease in both lower lungs with bronchial wall thickening-starting empiric  ceftriaxone  doxycycline  for CAP coverage> sending urinary antigens if cultures antigens  Lactic acidosis likely from new onset CHF Concern for multifocal  pneumonia based on CT abdomen vs fluid overload: Unclear etiology could be in the setting of #1-w/ MI vs from intractable nausea vomiting and hyperglycemia Patient received IV fluids continue IV fluids,  lactate 3.3> 4.6 earlier in am - recheck down to 3.8, continue IV fluids trend lactic acid  check procalcitonin. Recent Labs  Lab 02/10/24 0643 02/10/24 0806 02/10/24 1050  LATICACIDVEN 4.6* 3.8* 4.1*  PROCALCITON  --  0.10  --      T2DM with uncontrolled hyperglycemia: Blood sugar earlier in 427> down to 211 this morning, of note patient was given D5 LR x 1- repeat BMP ordered for anion gap, bHOB slightly up we will recheck. PTA on glipizide, Semglee  22 units bedtime and 8 units Premeal Novolog .  Blood sugar now on lab 196 improving Holding OHA, continue on sliding insulin  and  low dose semglee  8 u, SSI 0 to 15 units Recent Labs  Lab 02/10/24 0134 02/10/24 0259 02/10/24 0612 02/10/24 0758 02/10/24 1149  GLUCAP 446*  --  211* 196* 199*  HGBA1C  --  9.0*  --   --   --     CKD3b: Baseline creatinine has been in the range of 1.2-1.5 from May, monitor. hold lasix  until tolerating PO. On IV fluids, nephrology consulted  HLD: Cont  pravastatin  as able.  Chronic opiate use Chronic back pain: On chronic oxycodone  5 mg q 6 hr prn  Incidental left thyroid  nodule 1.9 cm: Advised for nonemergent thyroid  ultrasound  Scattered parenchymal nodules throughout the lungs: Largest 3 mm no follow-up needed for low risk  Addendum at 1:05 pm: Patient blood pressure is soft, lactic acid trending up, discussed and consulted ICU team as well as  cardiology team.  If remains hypotensive may need inotropes/pressors, moving to stepdown.  Trending of lactic acid again if continues to increase from current 4, ICU considering for line placement and pressors/inotropes  Signout to team on [facility] for coverage Patient remains critically ill, at risk of decompensation  DVT prophylaxis: Heparin  Code  Status:   Code Status: Full Code Family Communication: plan of care discussed with patient at bedside. I called her POA friend Milla and Donny to update  and is aware about her acute illness Patient status is: Remains hospitalized because of severity of illness Level of care: Stepdown   Dispo: The patient is from: [facility].            Anticipated disposition: TBD Objective: Vitals last 24 hrs: Vitals:   02/10/24 1215 02/10/24 1230 02/10/24 1239 02/10/24 1242  BP: (!) 101/55 (!) 77/59  105/66  Pulse: 82 92    Resp: 20 18    Temp:   98.4 F (36.9 C)   TempSrc:   Oral   SpO2: 96% 98%    Weight:      Height:        Physical Examination: General exam: alert awake, oriented to place people, older than stated age, sick looking HEENT:Oral mucosa moist, Ear/Nose WNL grossly Respiratory system: Bilaterally diminished breath sounds,no use of accessory muscle Cardiovascular system: S1 & S2 +, No JVD. Gastrointestinal system: Abdomen soft,NT,ND, BS+ Nervous System: Alert, awake, moving all extremities,and following commands. Extremities: extremities warm, leg edema negative Skin: No rashes,no icterus. MSK: Normal muscle bulk,tone, power.   Medications reviewed:  Scheduled Meds:  [START ON 02/11/2024] aspirin  EC  81 mg Oral Daily  insulin  aspart  0-15 Units Subcutaneous Q4H   insulin  glargine  8 Units Subcutaneous QHS   metoprolol  succinate  50 mg Oral q morning   pantoprazole  (PROTONIX ) IV  40 mg Intravenous QHS   [START ON 02/11/2024] rosuvastatin   20 mg Oral Daily   Continuous Infusions:  cefTRIAXone  (ROCEPHIN )  IV Stopped (02/10/24 1306)   doxycycline  (VIBRAMYCIN ) IV     heparin  650 Units/hr (02/10/24 0852)   lactated ringers  Stopped (02/10/24 1300)   promethazine  (PHENERGAN ) injection (IM or IVPB) Stopped (02/10/24 9364)   Diet: Diet Order             Diet NPO time specified Except for: Sips with Meds  Diet effective midnight           Diet Heart Room service  appropriate? Yes; Fluid consistency: Thin  Diet effective now

## 2024-02-10 NOTE — ED Notes (Signed)
 Dr Isadora at bedside. IV fluids stopped per verbal order. Dr Isadora explaining plan of care to [facility] who is at bedside.

## 2024-02-10 NOTE — ED Notes (Signed)
 Telesitter  called  for  per  nurse

## 2024-02-10 NOTE — ED Notes (Signed)
 2nd call for  telesitter  per  charge  nurse

## 2024-02-10 NOTE — ED Notes (Signed)
 Pt has tried to get OOB 3 times since 0700. Pt is confused and combative. Hospitalist paged because HR keeps going into the 130s and 140s when pt trying to get OOB. Friend has left and said while leaving I've done everything I can. Pt has nonslip socks on, bed alarm is on, fall bracelet on. Pt was placed on bedpan but refused to urinate and demanded to have it removed. Pt continues stating I need to pee and I'm sick to my stomach.

## 2024-02-10 NOTE — ED Notes (Signed)
Provided crackers and ginger ale.

## 2024-02-10 NOTE — ED Notes (Signed)
 EKG reading acute lateral infarct. Hospitalist Ramesh informed face to face.

## 2024-02-10 NOTE — ED Notes (Signed)
 POA Cathy at bedside.

## 2024-02-10 NOTE — Telephone Encounter (Signed)
 Patient Product/process development scientist completed.    The patient is insured through [facility]. Patient has Medicare and is not eligible for a copay card, but may be able to apply for patient assistance or Medicare RX Payment Plan (Patient Must reach out to their plan, if eligible for payment plan), if available.    Ran test claim for Farxiga 10 mg and the current 30 day co-pay is $47.00.   This test claim was processed through [facility]- copay amounts may vary at other pharmacies due to pharmacy/plan contracts, or as the patient moves through the different stages of their insurance plan.     Roland Earl, CPHT Pharmacy Technician III Certified Patient Advocate [facility] Patient Advocate Team Direct Number: [phone]  Fax: [phone]

## 2024-02-10 NOTE — ED Notes (Signed)
 Cardiology PA and hospitalist Ramesh at bedside. Asked if they want chewable ASA instead of PO. Order is being changed.

## 2024-02-10 NOTE — ED Notes (Signed)
 Calling lab to come draw STAT labs. IV's do not draw back and pt does not have any favorable veins to start a new IV. Also has bruising from other IV attempts. Lab is coming.

## 2024-02-10 NOTE — Progress Notes (Signed)
 PHARMACY - ANTICOAGULATION CONSULT NOTE  Pharmacy Consult for heparin  Indication: chest pain/ACS  Allergies  Allergen Reactions   Atorvastatin Diarrhea    Allergy not listed on MAR    Azithromycin Other (See Comments)    Pt states burns her insides. Allergy not listed on MAR     Erythromycin Other (See Comments)    Unknown. Allergy not listed on MAR    Other Other (See Comments)    Adhesive bandage, Anesthesia Extension set  -  unknown. Allergy not listed on MAR      Propoxyphene Other (See Comments)    Allergy not listed on [facility]      Patient Measurements: Height: 4' 11 (149.9 cm) Weight: 59 kg (130 lb) IBW/kg (Calculated) : 43.2 HEPARIN  DW (KG): 55.5  Vital Signs: Temp: 98.7 F (37.1 C) (09/16 0757) Temp Source: Oral (09/16 0757) BP: 173/98 (09/16 0800) Pulse Rate: 106 (09/16 0800)  Labs: Recent Labs    02/09/24 1851 02/09/24 1909 02/09/24 1955 02/10/24 0159 02/10/24 0259 02/10/24 0643  HGB 14.4  --   --   --  14.3  --   HCT 44.6  --   --   --  42.8  --   PLT 303  --   --   --  352  --   CREATININE 1.40*  --   --  1.40*  --   --   TROPONINIHS  --  77* 98*  --   --  2,432*    Estimated Creatinine Clearance: 24.2 mL/min (A) (by C-G formula based on SCr of 1.4 mg/dL (H)).   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   Patient is not on anticoagulation prior to admission but received heparin  subcutaneous DVT prophylaxis 9/16 @ 0202.  CBC WNL Trop 2,432  Goal of Therapy:  Heparin  level 0.3-0.7 units/ml Monitor platelets by anticoagulation protocol: Yes   Plan:  Give 3000 units bolus x 1 Start heparin  infusion at 650 units/hr Check anti-Xa level in 8 hours and daily while on heparin  Continue to monitor H&H and platelets  Elspeth Sour, PharmD Clinical Pharmacist 02/10/2024 8:35 AM

## 2024-02-10 NOTE — ED Notes (Signed)
 Pt placed on bedpan and given call light to press when finished. Family in room

## 2024-02-10 NOTE — ED Notes (Signed)
 On the phone with telesitter check in. Cart in room.

## 2024-02-11 DIAGNOSIS — R112 Nausea with vomiting, unspecified: Secondary | ICD-10-CM | POA: Diagnosis not present

## 2024-02-11 DIAGNOSIS — Z7189 Other specified counseling: Secondary | ICD-10-CM

## 2024-02-11 LAB — COMPREHENSIVE METABOLIC PANEL WITH GFR
ALT: 25 U/L (ref 0–44)
AST: 45 U/L — ABNORMAL HIGH (ref 15–41)
Albumin: 2.9 g/dL — ABNORMAL LOW (ref 3.5–5.0)
Alkaline Phosphatase: 79 U/L (ref 38–126)
Anion gap: 13 (ref 5–15)
BUN: 28 mg/dL — ABNORMAL HIGH (ref 8–23)
CO2: 22 mmol/L (ref 22–32)
Calcium: 8.2 mg/dL — ABNORMAL LOW (ref 8.9–10.3)
Chloride: 104 mmol/L (ref 98–111)
Creatinine, Ser: 1.96 mg/dL — ABNORMAL HIGH (ref 0.44–1.00)
GFR, Estimated: 25 mL/min — ABNORMAL LOW (ref >60)
Glucose, Bld: 145 mg/dL — ABNORMAL HIGH (ref 70–99)
Potassium: 4.3 mmol/L (ref 3.5–5.1)
Sodium: 139 mmol/L (ref 135–145)
Total Bilirubin: 0.8 mg/dL (ref 0.0–1.2)
Total Protein: 5.7 g/dL — ABNORMAL LOW (ref 6.5–8.1)

## 2024-02-11 LAB — LIPID PANEL
Cholesterol: 154 mg/dL (ref 0–200)
HDL: 29 mg/dL — ABNORMAL LOW (ref >40)
LDL Cholesterol: 86 mg/dL (ref 0–99)
Total CHOL/HDL Ratio: 5.3 ratio
Triglycerides: 196 mg/dL — ABNORMAL HIGH (ref <150)
VLDL: 39 mg/dL (ref 0–40)

## 2024-02-11 LAB — CBC
HCT: 34.1 % — ABNORMAL LOW (ref 36.0–46.0)
Hemoglobin: 11.3 g/dL — ABNORMAL LOW (ref 12.0–15.0)
MCH: 27.3 pg (ref 26.0–34.0)
MCHC: 33.1 g/dL (ref 30.0–36.0)
MCV: 82.4 fL (ref 80.0–100.0)
Platelets: 262 K/uL (ref 150–400)
RBC: 4.14 MIL/uL (ref 3.87–5.11)
RDW: 17.2 % — ABNORMAL HIGH (ref 11.5–15.5)
WBC: 20 K/uL — ABNORMAL HIGH (ref 4.0–10.5)
nRBC: 0 % (ref 0.0–0.2)

## 2024-02-11 LAB — EXPECTORATED SPUTUM ASSESSMENT W GRAM STAIN, RFLX TO RESP C

## 2024-02-11 LAB — BLOOD GAS, VENOUS
Bicarbonate: 22 mmol/L (ref 20.0–28.0)
Patient temperature: 37 mmol/L (ref 0.0–2.0)
pCO2, Ven: 31 mmHg — ABNORMAL LOW (ref 44–60)
pH, Ven: 7.46 — ABNORMAL HIGH (ref 7.25–7.43)
pO2, Ven: 22 mmHg (ref 32–45)

## 2024-02-11 LAB — GLUCOSE, CAPILLARY
Glucose-Capillary: 102 mg/dL — ABNORMAL HIGH (ref 70–99)
Glucose-Capillary: 133 mg/dL — ABNORMAL HIGH (ref 70–99)
Glucose-Capillary: 133 mg/dL — ABNORMAL HIGH (ref 70–99)
Glucose-Capillary: 144 mg/dL — ABNORMAL HIGH (ref 70–99)
Glucose-Capillary: 161 mg/dL — ABNORMAL HIGH (ref 70–99)
Glucose-Capillary: 197 mg/dL — ABNORMAL HIGH (ref 70–99)

## 2024-02-11 LAB — MAGNESIUM: Magnesium: 1.6 mg/dL — ABNORMAL LOW (ref 1.7–2.4)

## 2024-02-11 LAB — HEPARIN LEVEL (UNFRACTIONATED)
Heparin Unfractionated: 0.45 [IU]/mL (ref 0.30–0.70)
Heparin Unfractionated: 0.47 [IU]/mL (ref 0.30–0.70)

## 2024-02-11 LAB — TROPONIN I (HIGH SENSITIVITY): Troponin I (High Sensitivity): 4682 ng/L (ref <18)

## 2024-02-11 LAB — PHOSPHORUS: Phosphorus: 3.9 mg/dL (ref 2.5–4.6)

## 2024-02-11 MED ORDER — METOPROLOL SUCCINATE ER 25 MG PO TB24
25.0000 mg | ORAL_TABLET | Freq: Every morning | ORAL | Status: DC
Start: 2024-02-11 — End: 2024-02-12
  Administered 2024-02-11: 25 mg via ORAL
  Filled 2024-02-11: qty 1

## 2024-02-11 MED ORDER — FUROSEMIDE 10 MG/ML IJ SOLN
40.0000 mg | Freq: Every day | INTRAMUSCULAR | Status: DC
  Administered 2024-02-11: 40 mg via INTRAVENOUS
  Filled 2024-02-11: qty 4

## 2024-02-11 MED ORDER — DAPAGLIFLOZIN PROPANEDIOL 10 MG PO TABS
10.0000 mg | ORAL_TABLET | Freq: Every day | ORAL | Status: DC
  Administered 2024-02-11 – 2024-02-13 (×3): 10 mg via ORAL
  Filled 2024-02-11 (×3): qty 1

## 2024-02-11 MED ORDER — MAGNESIUM SULFATE 2 GM/50ML IV SOLN
2.0000 g | Freq: Once | INTRAVENOUS | Status: AC
  Administered 2024-02-11: 2 g via INTRAVENOUS
  Filled 2024-02-11: qty 50

## 2024-02-11 NOTE — Progress Notes (Addendum)
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 as visualized is within normal limits. Lungs/Pleura: Lungs are well aerated bilaterally. No focal infiltrate or sizable effusion is seen. Scattered parenchymal nodules are noted bilaterally. The largest of these lies within the right middle lobe best seen on image number 63 of series 5 measuring 3 mm in dimension. Upper Abdomen: Visualized upper abdomen shows no acute abnormality. Musculoskeletal: Degenerative changes of the thoracic spine are noted. Review of the MIP images confirms the above findings. IMPRESSION: No evidence of pulmonary emboli. Incidental left thyroid  nodule measuring 1.9 cm. Recommend non-emergent thyroid  ultrasound. Reference: J Am Coll Radiol. 2015 Feb;12(2): 143-50 Scattered parenchymal nodules are noted throughout both lungs. The largest of these measures 3 mm. No follow-up needed if patient is low-risk (and has no known or suspected primary neoplasm). Non-contrast chest CT can be considered in 12 months if patient is high-risk. This recommendation follows the consensus statement: Guidelines for Management of Incidental Pulmonary Nodules Detected on CT Images: From  the Fleischner Society 2017; Radiology 2017; 284:228-243. Aortic Atherosclerosis (ICD10-I70.0). Electronically Signed   By: Oneil Devonshire M.D.   On: 02/09/2024 22:45   DG Chest Port 1 View Result Date: 02/09/2024 CLINICAL DATA:  Shortness of breath EXAM: PORTABLE CHEST 1 VIEW COMPARISON:  08/04/2023 FINDINGS: Cardiac shadow is within normal limits. Lungs are well aerated bilaterally. Elevation of the right hemidiaphragm is again seen. No acute infiltrate is noted. No bony abnormality is seen. IMPRESSION: No acute abnormality noted. Electronically Signed   By: Oneil Devonshire M.D.   On: 02/09/2024 19:47    ECHO as above.  TELEMETRY reviewed by me 02/11/2024: Sinus rhythm, rate 70  EKG reviewed by me: Sinus rhythm, rate 69 bpm without acute ischemic changes.  Data reviewed by me 02/11/2024: last 24h vitals tele labs imaging I/O hospitalist progress notes.  Principal Problem:   Intractable nausea and vomiting Active Problems:   Hypertension associated with diabetes (HCC)   Type 2 diabetes mellitus  with hyperglycemia, with long-term current use of insulin  [facility])   ACS (acute coronary syndrome) (HCC)   NSTEMI (non-ST elevated myocardial infarction) (HCC)    ASSESSMENT AND PLAN:  ARIANY KESSELMAN is a 83 y.o. female  from [place] with a past medical history of dementia, report hx of tobacco use, hypertension, CKD 3B, type 2 diabetes mellitus who presented to the ED on 02/09/2024 for persistent nausea and vomiting and back pain. Patient denies any chest pain. Patient denies any history of CAD, MI or HF.  Cardiology was consulted for further evaluation.   # NSTEMI # Takotsubo Cardiomyopathy # Acute HFrEF (EF 20-25%) # Lactic acidosis, resolved # Leukocytosis # Hypertension # Nausea/Vomiting, improved # AKI on CKD 3b # Dementia  Patient denies chest pain. EKG without acute ischemic changes. Troponins elevated and trending 70 > 90 > 2400 > 3000 > 5500 > 4000. Echo revealed newly reduced EF  20-25% with apical ballooning with hyperkinetic bases consistent with Takotsubo CM, grade II diastolic dysfunction. 09/16 with BNP increased 230 > 2000. Repeat CT with pleural effusions. -Continue IV heparin  infusion for 48-72 hours for medical management.  -Continue aspirin  81 mg and Crestor  20 mg daily.  -Decreased p.o. metoprolol  succinate to 25 mg daily. -losartan  held due to borderline BP. Will resume when BP allows. -Ordered IV lasix  40mg  daily. Closely monitor UOP and renal function.  -Ordered dapagliflozin  10 mg daily. ([place] $47) -Will plan to initiate spirolactone tomorrow if renal function allows.  -Will plan to optimize GDMT as BP and renal function allows.  -Discussed R/ LHC vs medical management with patient's POA. With patients baseline dementia, CKD with poor GFR and poor baseline quality of life, unable to take care of herself its reasonable to treat patient conservatively with medical management at this time.  -Nephrology consulted, appreciate recommendations -Palliative consulted for GOC.   This patient's plan of care was discussed and created with Dr. Custovic and she is in agreement.  Signed: Dorene Comfort, PA-C  02/11/2024, 8:41 AM [facility]

## 2024-02-11 NOTE — Plan of Care (Signed)

## 2024-02-11 NOTE — Progress Notes (Signed)
 PHARMACY - ANTICOAGULATION CONSULT NOTE  Pharmacy Consult for heparin  Indication: chest pain/ACS  Allergies  Allergen Reactions   Atorvastatin Diarrhea    Allergy not listed on MAR    Azithromycin Other (See Comments)    Pt states burns her insides. Allergy not listed on MAR     Erythromycin Other (See Comments)    Unknown. Allergy not listed on MAR    Other Other (See Comments)    Adhesive bandage, Anesthesia Extension set  -  unknown. Allergy not listed on MAR      Propoxyphene Other (See Comments)    Allergy not listed on [facility]      Patient Measurements: Height: 4' 11 (149.9 cm) Weight: 58.5 kg (128 lb 15.5 oz) IBW/kg (Calculated) : 43.2 HEPARIN  DW (KG): 55.5  Vital Signs: Temp: 98.6 F (37 C) (09/16 2328) Temp Source: Oral (09/16 2031) BP: 143/70 (09/16 2332) Pulse Rate: 100 (09/16 2332)  Labs: Recent Labs    02/09/24 1851 02/09/24 1909 02/10/24 0159 02/10/24 0259 02/10/24 9356 02/10/24 0806 02/10/24 1701 02/10/24 2019 02/11/24 0016 02/11/24 0320  HGB 14.4  --   --  14.3  --   --   --   --   --  11.3*  HCT 44.6  --   --  42.8  --   --   --   --   --  34.1*  PLT 303  --   --  352  --   --   --   --   --  262  HEPARINUNFRC  --   --   --   --   --   --  0.25*  --   --  0.45  CREATININE 1.40*  --  1.40*  --   --  1.49*  --   --   --  1.96*  TROPONINIHS  --    < >  --   --    < > 3,005*  --  5,541* 4,682*  --    < > = values in this interval not displayed.    Estimated Creatinine Clearance: 17.2 mL/min (A) (by C-G formula based on SCr of 1.96 mg/dL (H)).   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   Taking As Needed   amLODipine  (NORVASC ) 5 MG tablet Take 1 tablet (5 mg total) by mouth daily. 30 tablet 0 Past Month   aspirin  EC 81 MG tablet Take 81 mg by mouth every morning.   02/09/2024   cetirizine (ZYRTEC) 10 MG tablet Take 10 mg by mouth every morning.   02/09/2024   cloNIDine  (CATAPRES ) 0.2 MG tablet  Take 0.2 mg by mouth daily.   Taking   famotidine (PEPCID) 20 MG tablet Take 20 mg by mouth daily.   Taking   Febuxostat  80 MG TABS Take 80 mg by mouth every morning.   02/09/2024   fluticasone  (FLONASE ) 50 MCG/ACT nasal spray Place 1 spray into both nostrils daily as needed for allergies or rhinitis.   Past Month   furosemide  (LASIX ) 40 MG tablet Take 1 tablet (40 mg total) by mouth 2 (two) times daily for 7 days, THEN 1 tablet (40 mg total) daily. 60 tablet 0 02/08/2024   glipiZIDE (GLUCOTROL) 5 MG tablet Take 5 mg by mouth daily.   02/09/2024   insulin  aspart (NOVOLOG ) 100 UNIT/ML injection Inject 8 Units into the skin 3 (three) times daily with meals. 10 mL 11 02/09/2024   insulin  glargine-yfgn (SEMGLEE ) 100 UNIT/ML injection Inject 0.16 mLs (16 Units total) into the skin at bedtime. 10 mL 11 Taking   liver oil-zinc  oxide (DESITIN) 40 % ointment Apply topically every 6 (six) hours as needed for irritation. 56.7 g 0 Taking As Needed   losartan  (COZAAR ) 50 MG tablet Take 50 mg by mouth daily.   02/08/2024   metoprolol  succinate (TOPROL -XL) 50 MG 24 hr tablet Take 50 mg by mouth every morning.   02/09/2024   Multiple Vitamin (MULTIVITAMIN) capsule Take 1 capsule by mouth every morning.    Taking   omeprazole (PRILOSEC) 20 MG capsule Take 20 mg by mouth every morning.   02/09/2024   oxyCODONE  (OXY IR/ROXICODONE ) 5 MG immediate release tablet Take 1 tablet (5 mg total) by mouth every 6 (six) hours as needed for moderate pain (pain score 4-6). 20 tablet 0 Taking As Needed   Vitamin D, Ergocalciferol, (DRISDOL) 1.25 MG (50000 UNIT) CAPS capsule Take 50,000 Units by mouth once a week.   02/09/2024   Insulin  Syringe-Needle U-100 (INSULIN  SYRINGE .5CC/28G) 28G X 1/2 0.5 ML MISC 1 Application by Does not apply route daily. 30 each 1    oxymetazoline (AFRIN) 0.05 % nasal spray Place 1 spray into both nostrils 2 (two) times daily as needed for congestion. (Patient not taking: Reported on 02/10/2024)   Not Taking   pravastatin  (PRAVACHOL ) 20 MG tablet Take 20 mg by mouth at bedtime.      vitamin B-12 (CYANOCOBALAMIN ) 500 MCG tablet Take 500 mcg by mouth every morning.       Assessment: Pharmacy consulted to dose heparin  in patient with chest pain/ACS.  Patient is not on anticoagulation prior to admission but received heparin  subcutaneous DVT prophylaxis 9/16 @ 0202.  CBC WNL Trop 2,432  0916 1701 HL 0.25- subtherapeutic 0917 0320 HL 0.45, therapeutic x 1  Goal of Therapy:  Heparin  level 0.3-0.7 units/ml Monitor platelets by anticoagulation protocol: Yes   Plan:  Continue heparin  infusion rate at 750 units/hr Recheck heparin  level in 8 hours to confirm Monitor CBC daily while on heparin   Rankin CANDIE Dills, PharmD, [facility] 02/11/2024 3:53 AM

## 2024-02-11 NOTE — Plan of Care (Signed)
   Problem: Coping: Goal: Ability to adjust to condition or change in health will improve Outcome: Progressing   Problem: Fluid Volume: Goal: Ability to maintain a balanced intake and output will improve Outcome: Progressing

## 2024-02-11 NOTE — Consult Note (Addendum)
 Consultation Note Date: 02/11/2024   Patient Name: Joy Tapia  DOB: 02-15-1941  MRN: [id number]  Age / Sex: 83 y.o., female  PCP: [facility] Referring Physician: Fausto Burnard LABOR, DO  Reason for Consultation: Establishing goals of care  HPI/Patient Profile: Per H&P: Joy Tapia is a 83 y.o. female with medical history significant of  T2DM, HTN, CKDIIIb sent to the emergency department for evaluation of nausea and vomiting that started yesterday.  She denies abdominal pain, fevers, SOB.  She has chronic diarrhea which she reports is at baseline.  Since she has been vomiting, she has developed non radiating substernal  chest pain and worsening of her chronic low back pain.    Clinical Assessment and Goals of Care: Notes and labs reviewed.  Advanced directive noted under ACP tab.  In to see patient.  She is sitting in bedside with her HPOA Joy Tapia with her.  Patient is able to answer some questions like her name, and age, and that she is widowed.  But states she is still living in her home and is unable to discuss anything about her healthcare issues or current condition.  Joy Tapia discusses that patient has no children or other family.  She discusses that patient lives in [place].  She discusses that she is H POA and her mother and another person are DURABLE POWER OF ATTORNEY.  Patient discusses that at baseline she enjoys crafts and woodworking and socializing.  H POA confirms this.  We discussed her diagnoses, prognosis, GOC, EOL wishes disposition and options.  Created space and opportunity for patient  to explore thoughts and feelings regarding current medical information.   A detailed discussion was had today regarding advanced directives.  Concepts specific to code status, artifical feeding and hydration, IV antibiotics and rehospitalization were discussed.  The difference  between an aggressive medical intervention path and a comfort care path was discussed.  Values and goals of care important to patient and family were attempted to be elicited.  Discussed limitations of medical interventions to prolong quality of life in some situations and discussed the concept of human mortality.  With great in-depth conversation, ultimately patient indicates she would want anything done to try to revive her, as she has seen this work for others in the past.  She is clear that she wants to live, and not just exist.  Quality of life would be an ability to get back to independence.  She states if after a few days to a week or so she is unable to do this, then she would want to focus on comfort.  She is clear she would not want to live on life support.  Of note, H POA discusses that patient had been on longstanding omeprazole.  She states that the omeprazole was discontinued and patient was put on a 14-day wean of Pepcid.  She states her last dose was 8/28, and inquires if reflux could have led to her pneumonia.  Discussed that PMT will continue to follow along.  SUMMARY OF RECOMMENDATIONS    Full code and full scope.  Would want short-term ventilator support.   Would recommend discharging on omeprazole as patient was on this longstanding.  Would consider initiating Neurontin or Lyrica as patient has neuropathic pain in her left foot.       Primary Diagnoses: Present on Admission:  Hypertension associated with diabetes (HCC)  ACS (acute coronary syndrome) (HCC)  NSTEMI (non-ST elevated myocardial infarction) (HCC)   I have reviewed the medical record, interviewed the patient and family, and examined the patient. The following aspects are pertinent.  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 (HCC)    Intractable nausea and vomiting 02/10/2024   Migraines    Mild cognitive impairment    Non-compliance    Osteoarthritis    Osteoporosis, post-menopausal    Pneumonia    Social History   Socioeconomic History   Marital status: Widowed    Spouse name: Not on file   Number of children: Not on file   Years of education: Not on file   Highest education level: Not on file  Occupational History   Not on file  Tobacco Use   Smoking status: Former    Types: Cigarettes   Smokeless tobacco: Never  Vaping Use   Vaping status: Never Used  Substance and Sexual Activity   Alcohol use: Not Currently   Drug use: Never   Sexual activity: Not Currently  Other Topics Concern   Not on file  Social History Narrative   Patient lives in assistance living    Social Drivers of Health   Financial Resource Strain: Not on file  Food Insecurity: No Food Insecurity (02/10/2024)   Hunger Vital Sign    Worried About Running Out of Food in the Last Year: Never true    Ran Out of Food in the Last Year: Never true  Transportation Needs: No Transportation Needs (02/10/2024)   PRAPARE - Administrator, Civil Service (Medical): No    Lack of Transportation (Non-Medical): No  Physical Activity: Not on file  Stress: Not on file  Social Connections: Socially Isolated (02/10/2024)   Social Connection and Isolation Panel    Frequency of Communication with Friends and Family: Three times a week    Frequency of Social Gatherings with Friends and Family: Three times a week    Attends Religious Services: Never    Active Member of Clubs or Organizations: No    Attends Banker Meetings: Not on file    Marital Status: Widowed   History reviewed. No pertinent family history. Scheduled Meds:  aspirin  EC  81 mg Oral Daily   dapagliflozin   propanediol  10 mg Oral Daily   furosemide   40 mg Intravenous Daily   insulin  aspart  0-15 Units Subcutaneous Q4H   insulin  glargine  8 Units Subcutaneous QHS   metoprolol  succinate  25 mg Oral q morning   pantoprazole  (PROTONIX ) IV  40 mg Intravenous QHS   rosuvastatin   20 mg Oral Daily   Continuous Infusions:  cefTRIAXone  (ROCEPHIN )  IV 2 g (02/11/24 1313)   doxycycline  (VIBRAMYCIN ) IV  100 mg (02/11/24 1503)   heparin  750 Units/hr (02/11/24 1509)   promethazine  (PHENERGAN ) injection (IM or IVPB) Stopped (02/10/24 0635)   PRN Meds:.acetaminophen  **OR** acetaminophen , morphine  injection, oxyCODONE , promethazine  (PHENERGAN ) injection (IM or IVPB), trimethobenzamide  Medications Prior to Admission:  Prior to Admission medications   Medication Sig Start Date End Date Taking? Authorizing Provider  acetaminophen  (TYLENOL ) 325 MG tablet Take 650 mg by mouth every 8 (eight) hours as needed for mild pain (pain score 1-3) or moderate pain (pain score 4-6).   Yes [provider]  amLODipine  (NORVASC ) 5 MG tablet Take 1 tablet (5 mg total) by mouth daily. 10/07/23  Yes Trixie Nilda HERO, MD  aspirin  EC 81 MG tablet Take 81 mg by mouth every morning.   Yes [provider]  cetirizine (ZYRTEC) 10 MG tablet Take 10 mg by mouth every morning.   Yes [provider]  cloNIDine  (CATAPRES ) 0.2 MG tablet Take 0.2 mg by mouth daily. 02/02/24  Yes [provider]  famotidine (PEPCID) 20 MG tablet Take 20 mg by mouth daily. 01/21/24  Yes [provider]  Febuxostat  80 MG TABS Take 80 mg by mouth every morning.   Yes [provider]  fluticasone  (FLONASE ) 50 MCG/ACT nasal spray Place 1 spray into both nostrils daily as needed for allergies or rhinitis.   Yes [provider]  furosemide  (LASIX ) 40 MG tablet Take 1 tablet (40 mg total) by mouth 2 (two) times daily for 7 days, THEN 1 tablet (40 mg total) daily. 10/08/23  Yes Tobie Yetta HERO, MD  glipiZIDE  (GLUCOTROL) 5 MG tablet Take 5 mg by mouth daily. 02/08/24  Yes [provider]  insulin  aspart (NOVOLOG ) 100 UNIT/ML injection Inject 8 Units into the skin 3 (three) times daily with meals. 10/07/23  Yes Gherghe, Costin M, MD  insulin  glargine-yfgn (SEMGLEE ) 100 UNIT/ML injection Inject 0.16 mLs (16 Units total) into the skin at bedtime. 10/07/23  Yes Gherghe, Costin M, MD  liver oil-zinc  oxide (DESITIN) 40 % ointment Apply topically every 6 (six) hours as needed for irritation. 08/07/23  Yes Leotis Bogus, MD  losartan  (COZAAR ) 50 MG tablet Take 50 mg by mouth daily. 02/07/24  Yes [provider]  metoprolol  succinate (TOPROL -XL) 50 MG 24 hr tablet Take 50 mg by mouth every morning.   Yes [provider]  Multiple Vitamin (MULTIVITAMIN) capsule Take 1 capsule by mouth every morning.   Yes [provider]  omeprazole (PRILOSEC) 20 MG capsule Take 20 mg by mouth every morning.   Yes [provider]  oxyCODONE  (OXY IR/ROXICODONE ) 5 MG immediate release tablet Take 1 tablet (5 mg total) by mouth every 6 (six) hours as needed for moderate pain (pain score 4-6). 10/07/23  Yes Gherghe, Costin M, MD  Vitamin D, Ergocalciferol, (DRISDOL) 1.25 MG (50000 UNIT) CAPS capsule Take 50,000 Units by mouth once a week. 01/08/24  Yes [provider]  Insulin  Syringe-Needle U-100 (INSULIN  SYRINGE .5CC/28G) 28G X 1/2 0.5 ML MISC 1 Application by Does not apply route daily. 04/29/23   Regalado, Belkys A, MD  oxymetazoline (AFRIN) 0.05 % nasal spray Place 1 spray into both nostrils 2 (two) times daily as needed for congestion. Patient not taking: Reported on 02/10/2024    [provider]  pravastatin  (PRAVACHOL ) 20 MG tablet Take 20 mg by mouth at bedtime.    [provider]  vitamin B-12 (CYANOCOBALAMIN ) 500 MCG tablet Take 500 mcg by mouth every morning. 09/01/23   [provider]  Allergies  Allergen Reactions   Atorvastatin Diarrhea     Allergy not listed on MAR    Azithromycin Other (See Comments)    Pt states burns her insides. Allergy not listed on MAR     Erythromycin Other (See Comments)    Unknown. Allergy not listed on MAR    Other Other (See Comments)    Adhesive bandage, Anesthesia Extension set  -  unknown. Allergy not listed on MAR      Propoxyphene Other (See Comments)    Allergy not listed on MAR     Review of Systems  Neurological:        Left foot with tingling and tingling sensation where patient cannot keep it still.    Physical Exam Pulmonary:     Effort: Pulmonary effort is normal.  Skin:    General: Skin is warm and dry.  Neurological:     Mental Status: She is alert.     Comments: Can answer some questions appropriately     Vital Signs: BP 106/79 (BP Location: Right Arm)   Pulse 94   Temp 98.7 F (37.1 C)   Resp 16   Ht 4' 11 (1.499 m)   Wt 58.5 kg   SpO2 91%   BMI 26.05 kg/m  Pain Scale: 0-10   Pain Score: 0-No pain   SpO2: SpO2: 91 % O2 Device:SpO2: 91 % O2 Flow Rate: .O2 Flow Rate (L/min): 1 L/min  IO: Intake/output summary:  Intake/Output Summary (Last 24 hours) at 02/11/2024 1514 Last data filed at 02/11/2024 1420 Gross per 24 hour  Intake 1604.28 ml  Output --  Net 1604.28 ml    LBM: Last BM Date :  (PTA) Baseline Weight: Weight: 59 kg Most recent weight: Weight: 58.5 kg       Signed by: Camelia Lewis, NP   Please contact Palliative Medicine Team phone at [phone] for questions and concerns.  For individual provider: See Tracey

## 2024-02-11 NOTE — Progress Notes (Signed)
 PHARMACY - ANTICOAGULATION CONSULT NOTE  Pharmacy Consult for heparin  Indication: chest pain/ACS  Allergies  Allergen Reactions   Atorvastatin Diarrhea    Allergy not listed on MAR    Azithromycin Other (See Comments)    Pt states burns her insides. Allergy not listed on MAR     Erythromycin Other (See Comments)    Unknown. Allergy not listed on MAR    Other Other (See Comments)    Adhesive bandage, Anesthesia Extension set  -  unknown. Allergy not listed on MAR      Propoxyphene Other (See Comments)    Allergy not listed on [facility]      Patient Measurements: Height: 4' 11 (149.9 cm) Weight: 58.5 kg (128 lb 15.5 oz) IBW/kg (Calculated) : 43.2 HEPARIN  DW (KG): 55.5  Vital Signs: Temp: 98.7 F (37.1 C) (09/17 1142) BP: 106/79 (09/17 1142) Pulse Rate: 94 (09/17 1142)  Labs: Recent Labs    02/09/24 1851 02/09/24 1909 02/10/24 0159 02/10/24 0259 02/10/24 9356 02/10/24 0806 02/10/24 1701 02/10/24 2019 02/11/24 0016 02/11/24 0320 02/11/24 1217  HGB 14.4  --   --  14.3  --   --   --   --   --  11.3*  --   HCT 44.6  --   --  42.8  --   --   --   --   --  34.1*  --   PLT 303  --   --  352  --   --   --   --   --  262  --   HEPARINUNFRC  --   --   --   --   --   --  0.25*  --   --  0.45 0.47  CREATININE 1.40*  --  1.40*  --   --  1.49*  --   --   --  1.96*  --   TROPONINIHS  --    < >  --   --    < > 3,005*  --  5,541* 4,682*  --   --    < > = values in this interval not displayed.    Estimated Creatinine Clearance: 17.2 mL/min (A) (by C-G formula based on SCr of 1.96 mg/dL (H)).   Medical History: Past Medical History:  Diagnosis Date   Deep vein thrombosis (DVT) (HCC)    Dementia (HCC)    Diabetes mellitus without complication (HCC)    Foot fracture, right    GERD (gastroesophageal reflux disease)    Gout    Hoarding disorder    Hyperlipidemia    Hypertension    Hypertension in stage 4 chronic kidney disease due to type 2 diabetes mellitus (HCC)     Intractable nausea and vomiting 02/10/2024   Migraines    Mild cognitive impairment    Non-compliance    Osteoarthritis    Osteoporosis, post-menopausal    Pneumonia     Medications:  Medications Prior to Admission  Medication Sig Dispense Refill Last Dose/Taking   acetaminophen  (TYLENOL ) 325 MG tablet Take 650 mg by mouth every 8 (eight) hours as needed for mild pain (pain score 1-3) or moderate pain (pain score 4-6).   Taking As Needed   amLODipine  (NORVASC ) 5 MG tablet Take 1 tablet (5 mg total) by mouth daily. 30 tablet 0 Past Month   aspirin  EC 81 MG tablet Take 81 mg by mouth every morning.   02/09/2024   cetirizine (ZYRTEC) 10 MG tablet Take 10 mg by  mouth every morning.   02/09/2024   cloNIDine  (CATAPRES ) 0.2 MG tablet Take 0.2 mg by mouth daily.   Taking   famotidine (PEPCID) 20 MG tablet Take 20 mg by mouth daily.   Taking   Febuxostat  80 MG TABS Take 80 mg by mouth every morning.   02/09/2024   fluticasone  (FLONASE ) 50 MCG/ACT nasal spray Place 1 spray into both nostrils daily as needed for allergies or rhinitis.   Past Month   furosemide  (LASIX ) 40 MG tablet Take 1 tablet (40 mg total) by mouth 2 (two) times daily for 7 days, THEN 1 tablet (40 mg total) daily. 60 tablet 0 02/08/2024   glipiZIDE (GLUCOTROL) 5 MG tablet Take 5 mg by mouth daily.   02/09/2024   insulin  aspart (NOVOLOG ) 100 UNIT/ML injection Inject 8 Units into the skin 3 (three) times daily with meals. 10 mL 11 02/09/2024   insulin  glargine-yfgn (SEMGLEE ) 100 UNIT/ML injection Inject 0.16 mLs (16 Units total) into the skin at bedtime. 10 mL 11 Taking   liver oil-zinc  oxide (DESITIN) 40 % ointment Apply topically every 6 (six) hours as needed for irritation. 56.7 g 0 Taking As Needed   losartan  (COZAAR ) 50 MG tablet Take 50 mg by mouth daily.   02/08/2024   metoprolol  succinate (TOPROL -XL) 50 MG 24 hr tablet Take 50 mg by mouth every morning.   02/09/2024   Multiple Vitamin (MULTIVITAMIN) capsule Take 1 capsule by mouth  every morning.   Taking   omeprazole (PRILOSEC) 20 MG capsule Take 20 mg by mouth every morning.   02/09/2024   oxyCODONE  (OXY IR/ROXICODONE ) 5 MG immediate release tablet Take 1 tablet (5 mg total) by mouth every 6 (six) hours as needed for moderate pain (pain score 4-6). 20 tablet 0 Taking As Needed   Vitamin D, Ergocalciferol, (DRISDOL) 1.25 MG (50000 UNIT) CAPS capsule Take 50,000 Units by mouth once a week.   02/09/2024   Insulin  Syringe-Needle U-100 (INSULIN  SYRINGE .5CC/28G) 28G X 1/2 0.5 ML MISC 1 Application by Does not apply route daily. 30 each 1    oxymetazoline (AFRIN) 0.05 % nasal spray Place 1 spray into both nostrils 2 (two) times daily as needed for congestion. (Patient not taking: Reported on 02/10/2024)   Not Taking   pravastatin  (PRAVACHOL ) 20 MG tablet Take 20 mg by mouth at bedtime.      vitamin B-12 (CYANOCOBALAMIN ) 500 MCG tablet Take 500 mcg by mouth every morning.       Assessment: Pharmacy consulted to dose heparin  in patient with chest pain/ACS.  Patient is not on anticoagulation prior to admission but received heparin  subcutaneous DVT prophylaxis 9/16 @ 0202.  CBC WNL Trop 2,432  Date Time Results Comments 0916 1701 HL 0.25 subtherapeutic 0917 0320 HL 0.45 therapeutic x 1 0917 1217 HL 0.47 Therapeutic x 2, rate 750 u/h  Goal of Therapy:  Heparin  level 0.3-0.7 units/ml Monitor platelets by anticoagulation protocol: Yes   Plan:  Continue heparin  infusion rate at 750 units/hr Recheck heparin  level with AM bas Monitor CBC daily while on heparin   Bethenny Losee Rodriguez-Guzman PharmD, BCPS 02/11/2024 1:19 PM

## 2024-02-11 NOTE — Hospital Course (Signed)
Cystitis

## 2024-02-11 NOTE — Progress Notes (Addendum)
 Progress Note   Patient: Joy Tapia FMW:[id number] DOB: Jul 25, 1940 DOA: 02/09/2024     1 DOS: the patient was seen and examined on 02/11/2024   Brief hospital course:  83 y.o. female with medical history significant of  T2DM, HTN, CKDIIIb, dementia sent from assisted living facility to ED for evaluation of nausea and vomiting x 1 day.  She denies abdominal pain, fevers, SOB.  She has chronic diarrhea which she reports is at baseline.  Since she has been vomiting, she has developed non radiating substernal chest pain and worsening of her chronic low back pain.  ... In the ED, she was hypertensive up to 226/89, afebrile  saturating appropriately on room air.  Slight leukocytosis of 13.7, lactic acid 3.3.  Initial troponin 77>98, EKG nonischemic and stable compared to prior.  COVID flu and RSV negative...  See H&P for full HPI on admission & ED course.    Further history obtained from friends POA Joy Tapia and Joy Tapia who reported that as per Joy Tapia, she was throwing up for 2 days and chest pain last night as ALF and had chest pain and other time indigestion. Has been on PPI and has been off mid August and on Pepcid till Aug 28. They are all friends from [place] and was pt was reportedly living in bad situation and they moved her to local ALF in August.  They don't know her medical history She does have dementia moderate to severe per POA based on her assessment.   Patient was admitted for further evaluation and management as outlined in detail below.  In summary, pt's troponin continued to trend upwards above 2k, with repeat EKG 9/16 AM concerning for ischemic changes.   Cardiology was urgently consulted.  Pt was given aspirin  325 mg and started on IV heparin .  On Echo, findings consistent with Takotsubo cardiomyopathy with newly reduced EF of 20-25% with apical ballooning and hyperkinetic base.   Assessment and Plan:  NSTEMI  New onset HFrEF Takotsubo cardiomyopathy Persistent  nausea -- now resolved --Cardiology following --On heparin  drip x 48-72 hours --ASA 81 mg, Crestor  20 mg daily --metoprolol  reduced to 25 mg daily --losartan  on hold with borderline BP's, resume when able --IV Lasix  40 mg x 1 today --Monitor volume status - Io's, daily weights --Further GDMT as BP allows --R/L heart cath deferred for medical management after discussions were had with her POA, given baseline dementia, CKD, declining baseline quality of life --Palliative care consulted for GOC   Hypomagnesemia - Mg 1.6 this AM --2 g IV Mg-sulfate to replace --Maintain Mg>2  HTN urgency - on admission, BP's now soft Tachycardia: --Medications as above --Monitor HR's on telemetry    Nausea and vomiting - resolved Diarrhea - chronic intermittent Ketonuria: Abdominal exam benign,CT abdomen was pending> subsequently resulted without acute finding on abdomen LFTs lipase normal --Continue supportive care    Agitation - improved Dementia at baseline: CT head negative, abdominal exam benign. COVID/flu/RSV negative.  No obvious infection although has leukocytosis lactic acidosis She is alert awake oriented x 3.  --PRN safety sitter --Delirium precautions    Leukocytosis: ?Pneumonia, bibasilar Chest x-ray/CT chest angio no pneumonia.   UA is unremarkable for UTI COVID RSV influenza has been negative. Suspected reactive at this time in setting of acute CHF and NSTEMI, however CT abdomen showed patchy ground glass and confluent airspace disease in both lower lungs with bronchial wall thickening. --Follow blood cultures --Started on empiric ceftriaxone  doxycycline  for CAP coverage --Follow urinary antigens --Sputum  culture sent was not acceptable for testing --Continue empiric antibiotics --Consider SLP for swallow evaluation for ? aspiration    Lactic acidosis  - resolved likely from new onset CHF Concern for multifocal pneumonia based on CT abdomen vs fluid overload: Unclear  etiology could be in the setting of #1-w/ MI vs from intractable nausea vomiting and hyperglycemia   Type 2 Diabetes with uncontrolled hyperglycemia: CBG's into 400's earlier in admission, today in mid 100's.  PTA on glipizide, Semglee  22 units bedtime and 8 units Premeal Novolog .   A1c 9% --Continue Lantus  8 units at bedtime, sliding scale Novolog  0-15 units Q4H  AKI superimposed on CKD3b: Baseline creatinine has been in the range of 1.2-1.5. Today Cr up to 1.96 Aki due to IV contrast exposure and NSAID use prior to admission --Hold nephrotoxins, NSAID's, further IV contrast --Monitor closely with diuresis --Will consider gentle fluids if worsening tomorrow --Nephrology following   HLD: Cont  pravastatin    Chronic opiate use Chronic back pain: On chronic oxycodone  5 mg q 6 hr prn   Incidental left thyroid  nodule 1.9 cm: Advised for nonemergent thyroid  ultrasound   Scattered parenchymal nodules throughout the lungs: Largest 3 mm no follow-up needed for low risk      Subjective: Pt seen with friend Joy Tapia at bedside, pt awake sitting up in bed.  Pt denies N/V or shortness of breath.  Joy Tapia reports pt mental status is improved today, yesterday would not be able to answer questions like she is today.  Pt denies chest pain or other complaints.   Physical Exam: Vitals:   02/10/24 2332 02/11/24 0429 02/11/24 0758 02/11/24 1142  BP: (!) 143/70 (!) 131/56 (!) 105/53 106/79  Pulse: 100 86 74 94  Resp:  17 16 16   Temp:  98.7 F (37.1 C) 97.9 F (36.6 C) 98.7 F (37.1 C)  TempSrc:      SpO2:  100% 100% 91%  Weight:      Height:       General exam: awake, alert, no acute distress, pleasantly confused, smiling HEENT: moist mucus membranes, hearing grossly normal  Respiratory system: CTAB diminished bases, no wheezes, rales or rhonchi, normal respiratory effort. Cardiovascular system: normal S1/S2, RRRm trace peripheral edema Gastrointestinal system: soft, NT,  ND Central nervous system: A&O x self and hospital. no gross focal neurologic deficits, normal speech Skin: dry, intact, normal temperature Psychiatry: normal mood, congruent affect, abnormal judgement and insight due to dementia    Data Reviewed:  Notable labs -- glucose 145, BUN 28 Cr 1.96, Ca 8.2, Mg 1.6, albumin 2.9, AST 45, last troponin declined to 4682 from 5541, HDL low 29, TG's high 196, WBC up to 20.0, Hbg 11.3  Family Communication: Joy Tapia at bedside on rounds this AM  Disposition: Status is: Inpatient Remains inpatient appropriate because: 76 mi nutes  Planned Discharge Destination: Return to ALF versus SNF/rehab     Time spent: 45 minutes  Author: Burnard DELENA Cunning, DO 02/11/2024 3:25 PM  For on call review [email or web address].

## 2024-02-11 NOTE — Progress Notes (Signed)
 Heart Failure Navigator Progress Note  Assessed for Heart & Vascular TOC clinic readiness.  Patient does not meet criteria due to current [facility] patient.   Navigator will sign off at this time.  Charmaine Pines, RN, BSN [facility] Heart Failure Navigator Secure Chat Only

## 2024-02-11 NOTE — Progress Notes (Addendum)
 [facility]  ROUNDING NOTE   Subjective:   Patient seen resting in bed No family or friends present Alert and oriented to person and place Denies shortness of breath Lower extremity edema present  Creatinine 1.96 Heparin  drip  Objective:  Vital signs in last 24 hours:  Temp:  [97.9 F (36.6 C)-98.8 F (37.1 C)] 98.7 F (37.1 C) (09/17 1142) Pulse Rate:  [74-101] 94 (09/17 1142) Resp:  [16-29] 16 (09/17 1142) BP: (77-151)/(53-98) 106/79 (09/17 1142) SpO2:  [90 %-100 %] 91 % (09/17 1142) Weight:  [58.5 kg] 58.5 kg (09/16 2129)  Weight change: -0.467 kg Filed Weights   02/09/24 1814 02/10/24 2129  Weight: 59 kg 58.5 kg    Intake/Output: I/O last 3 completed shifts: In: 2584.3 [P.O.:150; I.V.:984.1; IV Piggyback:1450.2] Out: -    Intake/Output this shift:  Total I/O In: 600 [P.O.:600] Out: -   Physical Exam: General: NAD  Head: Normocephalic, atraumatic. Moist oral mucosal membranes  Eyes: Anicteric  Lungs:  Basilar rales, normal effort  Heart: Regular rate and rhythm  Abdomen:  Soft, nontender  Extremities: 1+ peripheral edema.  Neurologic: Awake, alert, conversant  Skin: Warm,dry, no rash       Basic Metabolic Panel: Recent Labs  Lab 02/09/24 1851 02/10/24 0159 02/10/24 0806 02/11/24 0320  NA 137 133* 139 139  K 4.6 4.5 3.9 4.3  CL 103 98 100 104  CO2 17* 16* 19* 22  GLUCOSE 178* 427* 216* 145*  BUN 32* 26* 25* 28*  CREATININE 1.40* 1.40* 1.49* 1.96*  CALCIUM  9.2 9.2 9.6 8.2*  MG  --   --   --  1.6*  PHOS  --   --   --  3.9    Liver Function Tests: Recent Labs  Lab 02/09/24 1851 02/10/24 0159 02/11/24 0320  AST 62* 53* 45*  ALT 36 36 25  ALKPHOS 111 111 79  BILITOT 1.1 1.0 0.8  PROT 7.4 7.5 5.7*  ALBUMIN 4.0 3.8 2.9*   Recent Labs  Lab 02/09/24 1851  LIPASE 50   No results for input(s): AMMONIA in the last 168 hours.  CBC: Recent Labs  Lab 02/09/24 1851 02/10/24 0259 02/11/24 0320  WBC 13.7* 17.7* 20.0*   HGB 14.4 14.3 11.3*  HCT 44.6 42.8 34.1*  MCV 83.2 80.6 82.4  PLT 303 352 262    Cardiac Enzymes: No results for input(s): CKTOTAL, CKMB, CKMBINDEX, TROPONINI in the last 168 hours.  BNP: Invalid input(s): POCBNP  CBG: Recent Labs  Lab 02/10/24 2010 02/10/24 2329 02/11/24 0424 02/11/24 0809 02/11/24 1144  GLUCAP 116* 178* 133* 133* 197*    Microbiology: Results for orders placed or performed during the hospital encounter of 02/09/24  Resp panel by RT-PCR (RSV, Flu A&B, Covid) Anterior Nasal Swab     Status: None   Collection Time: 02/09/24  7:16 PM   Specimen: Anterior Nasal Swab  Result Value Ref Range Status   SARS Coronavirus 2 by RT PCR NEGATIVE NEGATIVE Final    Comment: (NOTE) SARS-CoV-2 target nucleic acids are NOT DETECTED.  The SARS-CoV-2 RNA is generally detectable in upper respiratory specimens during the acute phase of infection. The lowest concentration of SARS-CoV-2 viral copies this assay can detect is 138 copies/mL. A negative result does not preclude SARS-Cov-2 infection and should not be used as the sole basis for treatment or other patient management decisions. A negative result may occur with  improper specimen collection/handling, submission of specimen other than nasopharyngeal swab, presence of viral mutation(s)  within the areas targeted by this assay, and inadequate number of viral copies(<138 copies/mL). A negative result must be combined with clinical observations, patient history, and epidemiological information. The expected result is Negative.  Fact Sheet for Patients:  BloggerCourse.com  Fact Sheet for Healthcare Providers:  SeriousBroker.it  This test is no t yet approved or cleared by the [place]  FDA and  has been authorized for detection and/or diagnosis of SARS-CoV-2 by FDA under an Emergency Use Authorization (EUA). This EUA will remain  in effect (meaning this  test can be used) for the duration of the COVID-19 declaration under Section 564(b)(1) of the Act, 21 U.S.C.section 360bbb-3(b)(1), unless the authorization is terminated  or revoked sooner.       Influenza A by PCR NEGATIVE NEGATIVE Final   Influenza B by PCR NEGATIVE NEGATIVE Final    Comment: (NOTE) The Xpert Xpress SARS-CoV-2/FLU/RSV plus assay is intended as an aid in the diagnosis of influenza from Nasopharyngeal swab specimens and should not be used as a sole basis for treatment. Nasal washings and aspirates are unacceptable for Xpert Xpress SARS-CoV-2/FLU/RSV testing.  Fact Sheet for Patients: BloggerCourse.com  Fact Sheet for Healthcare Providers: SeriousBroker.it  This test is not yet approved or cleared by the [place]  FDA and has been authorized for detection and/or diagnosis of SARS-CoV-2 by FDA under an Emergency Use Authorization (EUA). This EUA will remain in effect (meaning this test can be used) for the duration of the COVID-19 declaration under Section 564(b)(1) of the Act, 21 U.S.C. section 360bbb-3(b)(1), unless the authorization is terminated or revoked.     Resp Syncytial Virus by PCR NEGATIVE NEGATIVE Final    Comment: (NOTE) Fact Sheet for Patients: BloggerCourse.com  Fact Sheet for Healthcare Providers: SeriousBroker.it  This test is not yet approved or cleared by the [place]  FDA and has been authorized for detection and/or diagnosis of SARS-CoV-2 by FDA under an Emergency Use Authorization (EUA). This EUA will remain in effect (meaning this test can be used) for the duration of the COVID-19 declaration under Section 564(b)(1) of the Act, 21 U.S.C. section 360bbb-3(b)(1), unless the authorization is terminated or revoked.  Performed at [facility], [address] [place], [place] [address]   Culture, blood (routine x 2)      Status: None (Preliminary result)   Collection Time: 02/10/24  1:59 AM   Specimen: BLOOD LEFT ARM  Result Value Ref Range Status   Specimen Description BLOOD LEFT ARM  Final   Special Requests   Final    BOTTLES DRAWN AEROBIC ONLY Blood Culture results may not be optimal due to an inadequate volume of blood received in culture bottles   Culture   Final    NO GROWTH 1 DAY Performed at [facility], [address], [place], [place] [address]    Report Status PENDING  Incomplete  Culture, blood (routine x 2)     Status: None (Preliminary result)   Collection Time: 02/10/24  1:59 AM   Specimen: BLOOD LEFT HAND  Result Value Ref Range Status   Specimen Description BLOOD LEFT HAND  Final   Special Requests   Final    BOTTLES DRAWN AEROBIC ONLY Blood Culture results may not be optimal due to an inadequate volume of blood received in culture bottles   Culture   Final    NO GROWTH 1 DAY Performed at [facility], [address], [place], [place] [address]    Report Status PENDING  Incomplete  Expectorated Sputum Assessment w Gram Stain, Rflx to Resp Cult     Status: None   Collection Time: 02/11/24  4:44 AM   Specimen: Expectorated Sputum  Result Value Ref Range Status   Specimen Description EXPECTORATED SPUTUM  Final   Special Requests NONE  Final   Sputum evaluation   Final    Sputum specimen not acceptable for testing.  Please recollect.   JANESE MAROLYN HOE RN [phone] 02/11/24 HNM Performed at [facility], [address] [place], [place] [address]    Report Status 02/11/2024 FINAL  Final    Coagulation Studies: No results for input(s): LABPROT, INR in the last 72 hours.  Urinalysis: Recent Labs    02/09/24 2338  COLORURINE STRAW*  LABSPEC 1.015  PHURINE 6.0  GLUCOSEU 150*  HGBUR NEGATIVE  BILIRUBINUR NEGATIVE  KETONESUR 5*  PROTEINUR >=300*  NITRITE NEGATIVE  LEUKOCYTESUR NEGATIVE      Imaging: ECHOCARDIOGRAM COMPLETE Result  Date: 02/10/2024    ECHOCARDIOGRAM REPORT   Patient Name:   JENISIS HARMSEN Date of Exam: 02/10/2024 Medical Rec #:  [id number]           Height:       59.0 in Accession #:    [id number]          Weight:       130.0 lb Date of Birth:  [birth year]           BSA:          1.536 m Patient Age:    83 years            BP:           177/92 mmHg Patient Gender: F                   HR:           96 bpm. Exam Location:  [facility] Procedure: 2D Echo, Cardiac Doppler and Color Doppler (Both Spectral and Color            Flow Doppler were utilized during procedure). Indications:     Chest Pain R07.9  History:         Patient has no prior history of Echocardiogram examinations.                  Signs/Symptoms:Chest Pain.  Sonographer:     Ashley McNeely-Sloane Referring Phys:  [id number] DAVED BROCKS [facility] Diagnosing Phys: Sabina Custovic IMPRESSIONS  1. Apical ballooning with hyperkinetic bases consistent with Takotsubo cardiomyopathy. Left ventricular ejection fraction, by estimation, is 20 to 25%. The left ventricle has severely decreased function. The left ventricle demonstrates regional wall motion abnormalities (see scoring diagram/findings for description). Left ventricular diastolic parameters are consistent with Grade II diastolic dysfunction (pseudonormalization).  2. Right ventricular systolic function is normal. The right ventricular size is normal.  3. Left atrial size was mildly dilated.  4. The mitral valve is normal in structure. Moderate to severe mitral valve regurgitation. No evidence of mitral stenosis.  5. Tricuspid valve regurgitation is moderate.  6. The aortic valve is normal in structure. Aortic valve regurgitation is not visualized. No aortic stenosis is present.  7. The inferior vena cava is normal in size with greater than 50% respiratory variability, suggesting right atrial pressure of 3 mmHg. FINDINGS  Left Ventricle: Apical ballooning with hyperkinetic bases consistent with Takotsubo cardiomyopathy. Left  ventricular ejection fraction, by estimation, is 20 to 25%. The left ventricle has severely decreased function. The left  ventricle demonstrates regional wall motion abnormalities. The left ventricular internal cavity size was normal in size. There is no left ventricular hypertrophy. Left ventricular diastolic parameters are consistent with Grade II diastolic dysfunction (pseudonormalization).  LV Wall Scoring: The apical anterior segment, apical inferior segment, and apex are hypokinetic. Right Ventricle: The right ventricular size is normal. No increase in right ventricular wall thickness. Right ventricular systolic function is normal. Left Atrium: Left atrial size was mildly dilated. Right Atrium: Right atrial size was normal in size. Pericardium: There is no evidence of pericardial effusion. Mitral Valve: The mitral valve is normal in structure. Moderate to severe mitral valve regurgitation. No evidence of mitral valve stenosis. MV peak gradient, 8.8 mmHg. The mean mitral valve gradient is 4.0 mmHg. Tricuspid Valve: The tricuspid valve is normal in structure. Tricuspid valve regurgitation is moderate. Aortic Valve: The aortic valve is normal in structure. Aortic valve regurgitation is not visualized. No aortic stenosis is present. Aortic valve mean gradient measures 4.0 mmHg. Aortic valve peak gradient measures 7.4 mmHg. Aortic valve area, by VTI measures 1.06 cm. Pulmonic Valve: The pulmonic valve was normal in structure. Pulmonic valve regurgitation is not visualized. Aorta: The aortic root is normal in size and structure. Venous: The inferior vena cava is normal in size with greater than 50% respiratory variability, suggesting right atrial pressure of 3 mmHg. IAS/Shunts: No atrial level shunt detected by color flow Doppler.  LEFT VENTRICLE PLAX 2D LVIDd:         4.50 cm     Diastology LVIDs:         3.00 cm     LV e' medial:    5.75 cm/s LV PW:         0.90 cm     LV E/e' medial:  19.8 LV IVS:        0.90 cm      LV e' lateral:   8.70 cm/s LVOT diam:     1.70 cm     LV E/e' lateral: 13.1 LV SV:         23 LV SV Index:   15 LVOT Area:     2.27 cm  LV Volumes (MOD) LV vol d, MOD A4C: 56.7 ml LV vol s, MOD A4C: 48.4 ml LV SV MOD A4C:     56.7 ml RIGHT VENTRICLE             IVC RV Basal diam:  2.80 cm     IVC diam: 1.90 cm RV Mid diam:    2.10 cm RV S prime:     12.00 cm/s TAPSE (M-mode): 1.6 cm LEFT ATRIUM             Index        RIGHT ATRIUM          Index LA diam:        3.30 cm 2.15 cm/m   RA Area:     9.77 cm LA Vol (A2C):   36.3 ml 23.64 ml/m  RA Volume:   18.20 ml 11.85 ml/m LA Vol (A4C):   35.1 ml 22.85 ml/m LA Biplane Vol: 36.2 ml 23.57 ml/m  AORTIC VALVE                    PULMONIC VALVE AV Area (Vmax):    1.14 cm     PV Vmax:        0.94 m/s AV Area (Vmean):   1.11 cm     PV Vmean:  66.000 cm/s AV Area (VTI):     1.06 cm     PV VTI:         0.147 m AV Vmax:           136.00 cm/s  PV Peak grad:   3.6 mmHg AV Vmean:          93.400 cm/s  PV Mean grad:   2.0 mmHg AV VTI:            0.219 m      RVOT Peak grad: 2 mmHg AV Peak Grad:      7.4 mmHg AV Mean Grad:      4.0 mmHg LVOT Vmax:         68.10 cm/s LVOT Vmean:        45.600 cm/s LVOT VTI:          0.102 m LVOT/AV VTI ratio: 0.47  AORTA Ao Root diam: 2.90 cm Ao Asc diam:  2.80 cm MITRAL VALVE                TRICUSPID VALVE MV Area (PHT): 5.97 cm     TR Peak grad:   55.4 mmHg MV Area VTI:   1.20 cm     TR Mean grad:   41.0 mmHg MV Peak grad:  8.8 mmHg     TR Vmax:        372.00 cm/s MV Mean grad:  4.0 mmHg     TR Vmean:       310.0 cm/s MV Vmax:       1.48 m/s MV Vmean:      92.0 cm/s    SHUNTS MV Decel Time: 127 msec     Systemic VTI:  0.10 m MR Peak grad: 102.4 mmHg    Systemic Diam: 1.70 cm MR Mean grad: 79.0 mmHg     Pulmonic VTI:  0.101 m MR Vmax:      506.00 cm/s MR Vmean:     435.0 cm/s MV E velocity: 114.00 cm/s MV A velocity: 148.00 cm/s MV E/A ratio:  0.77 Sabina Custovic Electronically signed by Annalee Casa Signature Date/Time:  02/10/2024/12:44:19 PM    Final    CT ABDOMEN PELVIS WO CONTRAST Result Date: 02/10/2024 CLINICAL DATA:  Abdominal pain and vomiting. EXAM: CT ABDOMEN AND PELVIS WITHOUT CONTRAST TECHNIQUE: Multidetector CT imaging of the abdomen and pelvis was performed following the standard protocol without IV contrast. RADIATION DOSE REDUCTION: This exam was performed according to the departmental dose-optimization program which includes automated exposure control, adjustment of the mA and/or kV according to patient size and/or use of iterative reconstruction technique. COMPARISON:  08/06/2023 FINDINGS: Lower chest: Interval development of patchy ground-glass and confluent airspace disease in both lower lungs with associated bronchial wall thickening and septal prominence. Nodular components measure up to 6 mm. Small bilateral pleural effusions. Hepatobiliary: No suspicious focal abnormality within the liver parenchyma. Nonvisualization of the gallbladder compatible with the reported history of cholecystectomy. No intrahepatic or extrahepatic biliary dilation. Pancreas: No focal mass lesion. No dilatation of the main duct. No intraparenchymal cyst. No peripancreatic edema. Spleen: No splenomegaly. No suspicious focal mass lesion. Adrenals/Urinary Tract: No adrenal nodule or mass. Tiny well-defined homogeneous low-density lesions in both kidneys are too small to characterize but are statistically most likely benign and probably cysts. No followup imaging is recommended. 10 mm hypoattenuating lesion lower pole left kidney is stable since prior and approaches water density, likely a cyst. Excreted contrast material in the renal calices, ureters,  and bladder is compatible with CT imaging yesterday. Stomach/Bowel: Stomach is unremarkable. No gastric wall thickening. No evidence of outlet obstruction. Duodenum is normally positioned as is the ligament of Treitz. Duodenal diverticulum noted. No small bowel wall thickening. No small  bowel dilatation. The terminal ileum is normal. The appendix is normal. No gross colonic mass. No colonic wall thickening. Vascular/Lymphatic: There is mild atherosclerotic calcification of the abdominal aorta without aneurysm. There is no gastrohepatic or hepatoduodenal ligament lymphadenopathy. No retroperitoneal or mesenteric lymphadenopathy. No pelvic sidewall lymphadenopathy. Reproductive: There is no adnexal mass. Other: No intraperitoneal free fluid. Musculoskeletal: No worrisome lytic or sclerotic osseous abnormality. Advanced degenerative disc disease noted in the lumbar spine. IMPRESSION: 1. No acute findings in the abdomen or pelvis. Specifically, no findings to explain the patient's history of abdominal pain and vomiting. 2. Interval development of patchy ground-glass and confluent airspace disease in both lower lungs with associated bronchial wall thickening and septal prominence. Nodular components measure up to 6 mm. Imaging features are compatible with infectious/inflammatory etiology. Multifocal pneumonia a distinct consideration. Follow-up CT chest without contrast in 3 months recommended to ensure resolution. 3. Small bilateral pleural effusions. 4.  Aortic Atherosclerosis (ICD10-I70.0). Electronically Signed   By: Camellia Candle M.D.   On: 02/10/2024 11:51   US  Venous Img Lower Bilateral (DVT) Result Date: 02/09/2024 CLINICAL DATA:  Bilateral lower extremity pain and swelling EXAM: BILATERAL LOWER EXTREMITY VENOUS DOPPLER ULTRASOUND TECHNIQUE: Gray-scale sonography with graded compression, as well as color Doppler and duplex ultrasound were performed to evaluate the lower extremity deep venous systems from the level of the common femoral vein and including the common femoral, femoral, profunda femoral, popliteal and calf veins including the posterior tibial, peroneal and gastrocnemius veins when visible. The superficial great saphenous vein was also interrogated. Spectral Doppler was utilized  to evaluate flow at rest and with distal augmentation maneuvers in the common femoral, femoral and popliteal veins. COMPARISON:  None Available. FINDINGS: RIGHT LOWER EXTREMITY Common Femoral Vein: No evidence of thrombus. Normal compressibility, respiratory phasicity and response to augmentation. Saphenofemoral Junction: No evidence of thrombus. Normal compressibility and flow on color Doppler imaging. Profunda Femoral Vein: No evidence of thrombus. Normal compressibility and flow on color Doppler imaging. Femoral Vein: No evidence of thrombus. Normal compressibility, respiratory phasicity and response to augmentation. Popliteal Vein: No evidence of thrombus. Normal compressibility, respiratory phasicity and response to augmentation. Calf Veins: No evidence of thrombus. Normal compressibility and flow on color Doppler imaging. Superficial Great Saphenous Vein: No evidence of thrombus. Normal compressibility and flow on color Doppler imaging. Venous Reflux:  None. Other Findings:  None. LEFT LOWER EXTREMITY Common Femoral Vein: No evidence of thrombus. Normal compressibility, respiratory phasicity and response to augmentation. Saphenofemoral Junction: No evidence of thrombus. Normal compressibility and flow on color Doppler imaging. Profunda Femoral Vein: No evidence of thrombus. Normal compressibility and flow on color Doppler imaging. Femoral Vein: No evidence of thrombus. Normal compressibility, respiratory phasicity and response to augmentation. Popliteal Vein: No evidence of thrombus. Normal compressibility, respiratory phasicity and response to augmentation. Calf Veins: No evidence of thrombus. Normal compressibility and flow on color Doppler imaging. Superficial Great Saphenous Vein: No evidence of thrombus. Normal compressibility and flow on color Doppler imaging. Venous Reflux:  None. Other Findings:  None. IMPRESSION: No evidence of deep venous thrombosis. Electronically Signed   By: Oneil Devonshire M.D.    On: 02/09/2024 23:39   CT Head Wo Contrast Result Date: 02/09/2024 CLINICAL DATA:  Nausea and vomiting EXAM: CT  HEAD WITHOUT CONTRAST TECHNIQUE: Contiguous axial images were obtained from the base of the skull through the vertex without intravenous contrast. RADIATION DOSE REDUCTION: This exam was performed according to the departmental dose-optimization program which includes automated exposure control, adjustment of the mA and/or kV according to patient size and/or use of iterative reconstruction technique. COMPARISON:  None Available. FINDINGS: Brain: No evidence of acute infarction, hemorrhage, hydrocephalus, extra-axial collection or mass lesion/mass effect. Mild atrophic changes are noted commensurate with the patient's age. Vascular: No hyperdense vessel or unexpected calcification. Skull: Normal. Negative for fracture or focal lesion. Sinuses/Orbits: No acute finding. Other: None. IMPRESSION: Mild atrophic changes without acute abnormality. Electronically Signed   By: Oneil Devonshire M.D.   On: 02/09/2024 22:46   CT Angio Chest PE W/Cm &/Or Wo Cm Result Date: 02/09/2024 CLINICAL DATA:  Nausea and vomiting with shortness of breath, initial encounter EXAM: CT ANGIOGRAPHY CHEST WITH CONTRAST TECHNIQUE: Multidetector CT imaging of the chest was performed using the standard protocol during bolus administration of intravenous contrast. Multiplanar CT image reconstructions and MIPs were obtained to evaluate the vascular anatomy. RADIATION DOSE REDUCTION: This exam was performed according to the departmental dose-optimization program which includes automated exposure control, adjustment of the mA and/or kV according to patient size and/or use of iterative reconstruction technique. CONTRAST:  60mL OMNIPAQUE  IOHEXOL  350 MG/ML SOLN COMPARISON:  Chest x-ray from earlier in the same day. FINDINGS: Cardiovascular: Atherosclerotic calcifications of the thoracic aorta are noted. No aneurysmal dilatation or dissection is  seen. The heart is mildly enlarged in size. The pulmonary artery shows a normal branching pattern bilaterally. No filling defect is identified to suggest pulmonary embolism. Mild coronary calcifications are seen. No pericardial effusion is noted. Mediastinum/Nodes: Thoracic inlet shows evidence of a left inferior thyroid  nodule measuring 19 mm with mixed attenuation and heavy calcifications. This extends into the superior mediastinum. No hilar or mediastinal adenopathy is noted. The esophagus as visualized is within normal limits. Lungs/Pleura: Lungs are well aerated bilaterally. No focal infiltrate or sizable effusion is seen. Scattered parenchymal nodules are noted bilaterally. The largest of these lies within the right middle lobe best seen on image number 63 of series 5 measuring 3 mm in dimension. Upper Abdomen: Visualized upper abdomen shows no acute abnormality. Musculoskeletal: Degenerative changes of the thoracic spine are noted. Review of the MIP images confirms the above findings. IMPRESSION: No evidence of pulmonary emboli. Incidental left thyroid  nodule measuring 1.9 cm. Recommend non-emergent thyroid  ultrasound. Reference: J Am Coll Radiol. 2015 Feb;12(2): 143-50 Scattered parenchymal nodules are noted throughout both lungs. The largest of these measures 3 mm. No follow-up needed if patient is low-risk (and has no known or suspected primary neoplasm). Non-contrast chest CT can be considered in 12 months if patient is high-risk. This recommendation follows the consensus statement: Guidelines for Management of Incidental Pulmonary Nodules Detected on CT Images: From the Fleischner Society 2017; Radiology 2017; 284:228-243. Aortic Atherosclerosis (ICD10-I70.0). Electronically Signed   By: Oneil Devonshire M.D.   On: 02/09/2024 22:45   DG Chest Port 1 View Result Date: 02/09/2024 CLINICAL DATA:  Shortness of breath EXAM: PORTABLE CHEST 1 VIEW COMPARISON:  08/04/2023 FINDINGS: Cardiac shadow is within normal  limits. Lungs are well aerated bilaterally. Elevation of the right hemidiaphragm is again seen. No acute infiltrate is noted. No bony abnormality is seen. IMPRESSION: No acute abnormality noted. Electronically Signed   By: Oneil Devonshire M.D.   On: 02/09/2024 19:47     Medications:    cefTRIAXone  (ROCEPHIN )  IV Stopped (02/10/24 1306)   doxycycline  (VIBRAMYCIN ) IV 100 mg (02/11/24 0152)   heparin  750 Units/hr (02/10/24 1847)   promethazine  (PHENERGAN ) injection (IM or IVPB) Stopped (02/10/24 9364)    aspirin  EC  81 mg Oral Daily   dapagliflozin  propanediol  10 mg Oral Daily   furosemide   40 mg Intravenous Daily   insulin  aspart  0-15 Units Subcutaneous Q4H   insulin  glargine  8 Units Subcutaneous QHS   metoprolol  succinate  25 mg Oral q morning   pantoprazole  (PROTONIX ) IV  40 mg Intravenous QHS   rosuvastatin   20 mg Oral Daily   acetaminophen  **OR** acetaminophen , morphine  injection, oxyCODONE , promethazine  (PHENERGAN ) injection (IM or IVPB), trimethobenzamide   Assessment/ Plan:  Ms. ZUNAIRAH DEVERS is a 83 y.o.  female with medical problems of diabetes, hypertension, dementia, CKD was admitted on 02/09/2024 for Dehydration [E86.0] Starvation ketoacidosis [T73.0XXA, E87.29] Thyroid  nodule [E04.1] SOB (shortness of breath) [R06.02] Pulmonary nodules [R91.8] ACS (acute coronary syndrome) (HCC) [I24.9] Burning chest pain [R07.89] NSTEMI (non-ST elevated myocardial infarction) (HCC) [I21.4] Intractable nausea and vomiting [R11.2] Nausea and vomiting, unspecified vomiting type [R11.2]   AKI on Chronic kidney disease stage IIIb  - AKI likely secondary to IV contrast exposure. - Patient encouraged to maintain oral intake and hydration. - Avoid further IV contrast exposure, nonsteroidals. - Supportive care.  Lab Results  Component Value Date   CREATININE 1.96 (H) 02/11/2024   CREATININE 1.49 (H) 02/10/2024   CREATININE 1.40 (H) 02/10/2024    Intake/Output Summary (Last 24  hours) at 02/11/2024 1146 Last data filed at 02/11/2024 1100 Gross per 24 hour  Intake 2684.28 ml  Output --  Net 2684.28 ml    2.  Lower extremity edema, acute systolic and diastolic CHF -Based on most recent echo, patient has LVEF 20 to 25%, severely decreased LV function, moderate to severe mitral regurgitation and moderate tricuspid regurgitation.,  Grade 2 diastolic dysfunction - Received 500 mL liter normal saline bolus yesterday. - Will continue to monitor volume status. - Continue IV furosemide  40 mg daily.    LOS: 1 Faith Harris 9/17/202511:46 AM  Patient was seen and examined with Faith Harris, NP.  Plan of care was formulated for the problems addressed and discussed with NP.  I agree with the note as documented except as noted below.

## 2024-02-11 NOTE — TOC CM/SW Note (Signed)
 Per chart review, patient from [facility]. Left voicemail for their RN, Olam. Received notification from [facility] liaison, that they are active with patient for PT, OT, RN.  Lauraine Carpen, CSW [phone]

## 2024-02-12 DIAGNOSIS — R112 Nausea with vomiting, unspecified: Secondary | ICD-10-CM | POA: Diagnosis not present

## 2024-02-12 LAB — CBC
HCT: 32.7 % — ABNORMAL LOW (ref 36.0–46.0)
Hemoglobin: 10.6 g/dL — ABNORMAL LOW (ref 12.0–15.0)
MCH: 27.3 pg (ref 26.0–34.0)
MCHC: 32.4 g/dL (ref 30.0–36.0)
MCV: 84.3 fL (ref 80.0–100.0)
Platelets: 210 K/uL (ref 150–400)
RBC: 3.88 MIL/uL (ref 3.87–5.11)
RDW: 17.5 % — ABNORMAL HIGH (ref 11.5–15.5)
WBC: 15.5 K/uL — ABNORMAL HIGH (ref 4.0–10.5)
nRBC: 0 % (ref 0.0–0.2)

## 2024-02-12 LAB — MAGNESIUM: Magnesium: 2.2 mg/dL (ref 1.7–2.4)

## 2024-02-12 LAB — BASIC METABOLIC PANEL WITH GFR
Anion gap: 10 (ref 5–15)
BUN: 31 mg/dL — ABNORMAL HIGH (ref 8–23)
CO2: 20 mmol/L — ABNORMAL LOW (ref 22–32)
Calcium: 8 mg/dL — ABNORMAL LOW (ref 8.9–10.3)
Chloride: 109 mmol/L (ref 98–111)
Creatinine, Ser: 2 mg/dL — ABNORMAL HIGH (ref 0.44–1.00)
GFR, Estimated: 24 mL/min — ABNORMAL LOW (ref >60)
Glucose, Bld: 139 mg/dL — ABNORMAL HIGH (ref 70–99)
Potassium: 3.8 mmol/L (ref 3.5–5.1)
Sodium: 139 mmol/L (ref 135–145)

## 2024-02-12 LAB — GLUCOSE, CAPILLARY
Glucose-Capillary: 135 mg/dL — ABNORMAL HIGH (ref 70–99)
Glucose-Capillary: 108 mg/dL — ABNORMAL HIGH (ref 70–99)
Glucose-Capillary: 108 mg/dL — ABNORMAL HIGH (ref 70–99)
Glucose-Capillary: 127 mg/dL — ABNORMAL HIGH (ref 70–99)
Glucose-Capillary: 227 mg/dL — ABNORMAL HIGH (ref 70–99)
Glucose-Capillary: 172 mg/dL — ABNORMAL HIGH (ref 70–99)
Glucose-Capillary: 233 mg/dL — ABNORMAL HIGH (ref 70–99)

## 2024-02-12 LAB — LEGIONELLA PNEUMOPHILA SEROGP 1 UR AG: L. pneumophila Serogp 1 Ur Ag: NEGATIVE

## 2024-02-12 LAB — HEPARIN LEVEL (UNFRACTIONATED): Heparin Unfractionated: 0.25 [IU]/mL — ABNORMAL LOW (ref 0.30–0.70)

## 2024-02-12 MED ORDER — RISAQUAD PO CAPS
2.0000 | ORAL_CAPSULE | Freq: Every day | ORAL | Status: DC
  Administered 2024-02-12 – 2024-02-13 (×2): 2 via ORAL
  Filled 2024-02-12 (×2): qty 2

## 2024-02-12 MED ORDER — LOPERAMIDE HCL 2 MG PO CAPS
2.0000 mg | ORAL_CAPSULE | ORAL | Status: DC | PRN
  Administered 2024-02-12: 2 mg via ORAL
  Filled 2024-02-12: qty 1

## 2024-02-12 MED ORDER — DM-GUAIFENESIN ER 30-600 MG PO TB12
1.0000 | ORAL_TABLET | Freq: Two times a day (BID) | ORAL | Status: DC
  Administered 2024-02-12 – 2024-02-13 (×3): 1 via ORAL
  Filled 2024-02-12 (×3): qty 1

## 2024-02-12 MED ORDER — SALINE SPRAY 0.65 % NA SOLN
1.0000 | NASAL | Status: DC | PRN
  Administered 2024-02-12 – 2024-02-13 (×2): 1 via NASAL
  Filled 2024-02-12: qty 44

## 2024-02-12 MED ORDER — HEPARIN BOLUS VIA INFUSION
800.0000 [IU] | Freq: Once | INTRAVENOUS | Status: AC
Start: 2024-02-12 — End: 2024-02-12
  Administered 2024-02-12: 800 [IU] via INTRAVENOUS
  Filled 2024-02-12: qty 800

## 2024-02-12 MED ORDER — LOSARTAN POTASSIUM 25 MG PO TABS: 25.0000 mg | ORAL_TABLET | Freq: Every day | ORAL | Status: DC

## 2024-02-12 MED ORDER — METOPROLOL SUCCINATE ER 50 MG PO TB24
50.0000 mg | ORAL_TABLET | Freq: Every morning | ORAL | Status: DC
  Administered 2024-02-12 – 2024-02-13 (×2): 50 mg via ORAL
  Filled 2024-02-12 (×2): qty 1

## 2024-02-12 MED ORDER — HYDRALAZINE HCL 25 MG PO TABS
25.0000 mg | ORAL_TABLET | Freq: Three times a day (TID) | ORAL | Status: DC
  Administered 2024-02-12 – 2024-02-13 (×4): 25 mg via ORAL
  Filled 2024-02-12 (×4): qty 1

## 2024-02-12 MED ORDER — GUAIFENESIN 100 MG/5ML PO LIQD: 5.0000 mL | ORAL | Status: DC | PRN

## 2024-02-12 MED ORDER — ENOXAPARIN SODIUM 30 MG/0.3ML IJ SOSY
30.0000 mg | PREFILLED_SYRINGE | INTRAMUSCULAR | Status: DC
  Administered 2024-02-13: 30 mg via SUBCUTANEOUS
  Filled 2024-02-12: qty 0.3

## 2024-02-12 NOTE — Progress Notes (Addendum)
 Daily Progress Note   Patient Name: Joy Tapia       Date: 02/12/2024 DOB: 02-Nov-1940  Age: 83 y.o. MRN#: [id number] Attending Physician: Fausto Burnard LABOR, DO Primary Care Physician: Shelley Loring, Pllc Admit Date: 02/09/2024  Reason for Consultation/Follow-up: Establishing goals of care  Subjective: Notes and labs reviewed.  Into see patient.  She is currently sitting in bedside chair at this time.  She does not remember me from yesterday and is unable to remember Cathy's name.  She is able to say that she is the daughter of one of her friends.  She denies complaints but states that everyone has been coming in and out so she does not feel like answering any questions at this time.  Stepped out and spoke with [facility]  Donny discusses that they met at church as her mother was a Retail banker.  She discusses that after the patient's husband died, she stopped caring for her house and stopped caring for herself.  She discusses that patient was placed in a facility where she hoards and hides food as she wants things that are high in sugar and carbohydrates though she has diabetes, and should not have them.  She discusses that patient does not desire to follow recommendations that have been made by providers, and desired to do things as she wanted to prior to developing dementia.  Discussed in great depth, decision making, patient autonomy, and quality of life versus quantity of life.  Discussed readmissions to the hospital and life prolongation.  Questions answered regarding a comfort focused home care.   We reviewed patient's advance directive in detail.  Questions answered regarding the form and decisions on a DNR status and other care moving forward.  She states she will need to speak  with her mother and family prior to making decisions moving forward.    Length of Stay: 2  Current Medications: Scheduled Meds:   acidophilus  2 capsule Oral Daily   aspirin  EC  81 mg Oral Daily   dapagliflozin  propanediol  10 mg Oral Daily   dextromethorphan -guaiFENesin   1 tablet Oral BID   [START ON 02/13/2024] enoxaparin  (LOVENOX ) injection  30 mg Subcutaneous Q24H   hydrALAZINE   25 mg Oral Q8H   insulin  aspart  0-15 Units Subcutaneous Q4H   insulin  glargine  8 Units  Subcutaneous QHS   metoprolol  succinate  50 mg Oral q morning   pantoprazole  (PROTONIX ) IV  40 mg Intravenous QHS   rosuvastatin   20 mg Oral Daily    Continuous Infusions:  cefTRIAXone  (ROCEPHIN )  IV 2 g (02/12/24 1221)   promethazine  (PHENERGAN ) injection (IM or IVPB) Stopped (02/10/24 9364)    PRN Meds: acetaminophen  **OR** acetaminophen , guaiFENesin , loperamide , morphine  injection, oxyCODONE , promethazine  (PHENERGAN ) injection (IM or IVPB), sodium chloride , trimethobenzamide   Physical Exam Pulmonary:     Effort: Pulmonary effort is normal.  Skin:    General: Skin is warm and dry.  Neurological:     Mental Status: She is alert.             Vital Signs: BP (!) 155/64 (BP Location: Left Arm)   Pulse 88   Temp 99.6 F (37.6 C)   Resp 18   Ht 4' 11 (1.499 m)   Wt 58.5 kg   SpO2 99%   BMI 26.05 kg/m  SpO2: SpO2: 99 % O2 Device: O2 Device: Room Air O2 Flow Rate: O2 Flow Rate (L/min): 1 L/min  Intake/output summary:  Intake/Output Summary (Last 24 hours) at 02/12/2024 1556 Last data filed at 02/12/2024 1221 Gross per 24 hour  Intake 1032.71 ml  Output 700 ml  Net 332.71 ml   LBM: Last BM Date : 02/12/24 Baseline Weight: Weight: 59 kg Most recent weight: Weight: 58.5 kg   Patient Active Problem List   Diagnosis Date Noted   Intractable nausea and vomiting 02/10/2024   ACS (acute coronary syndrome) (HCC) 02/10/2024   NSTEMI (non-ST elevated myocardial infarction) (HCC) 02/10/2024   Abscess  of left foot 10/02/2023   Cellulitis of left foot 10/01/2023   Type 2 diabetes mellitus with hyperglycemia, with long-term current use of insulin  (HCC) 10/01/2023   Acute kidney injury superimposed on chronic kidney disease (HCC) 10/01/2023   Malnutrition of moderate degree 08/05/2023   Hyperosmolar hyperglycemic state (HHS) (HCC) 08/04/2023   Cellulitis of both lower extremities 08/04/2023   Equinus deformity of right foot 06/16/2023   Ulcer of left foot with fat layer exposed (HCC) 06/16/2023   Diabetic foot infection (HCC) 06/13/2023   Cellulitis 04/26/2023   Fracture of patella 03/05/2023   Hoarding disorder 07/02/2022   CKD (chronic kidney disease) stage 4, GFR 15-29 ml/min (HCC) 06/29/2022   Hypertension associated with diabetes (HCC) 06/29/2022   Osteomyelitis (HCC) 06/28/2022   Diabetes mellitus (HCC) 04/29/2022   Mild cognitive impairment 06/26/2021   Non-compliance 04/11/2019   Health care maintenance 09/15/2014   Gout 12/25/2013   Hyperlipidemia associated with type 2 diabetes mellitus (HCC) 12/25/2013   Hypertension in stage 4 chronic kidney disease due to type 2 diabetes mellitus (HCC) 12/25/2013   Osteoporosis 12/25/2013    Palliative Care Assessment & Plan   Recommendations/Plan: Continue current care   Code Status:    Code Status Orders  (From admission, onward)           Start     Ordered   02/10/24 0100  Full code  Continuous       Question:  By:  Answer:  Consent: discussion documented in EHR   02/10/24 0103           Code Status History     Date Active Date Inactive Code Status Order ID Comments User Context   10/01/2023 2038 10/08/2023 1951 Full Code [id number]  Tobie Jorie SAUNDERS, MD Inpatient   08/04/2023 1418 08/07/2023 1940 Full Code [id number]  Celinda Alm Lot, MD  ED   06/13/2023 0350 06/19/2023 2138 Full Code [id number]  Lonzell Emeline HERO, DO ED   04/26/2023 2138 04/29/2023 2149 Full Code [id number]  Dena Charleston, MD ED   10/04/2022 0821  10/04/2022 1415 Full Code [id number]  Ashley Soulier, [facility] Inpatient   06/28/2022 1457 06/30/2022 1844 Full Code [id number]  Laurita Cort DASEN, MD ED       Camelia Lewis, NP  Please contact Palliative Medicine Team phone at [phone] for questions and concerns.

## 2024-02-12 NOTE — Care Management Important Message (Signed)
 Important Message  Patient Details  Name: Joy Tapia MRN: [id number] Date of Birth: Jun 06, 1940   Important Message Given:  Yes - Medicare IM     [name] 02/12/2024, 11:46 AM

## 2024-02-12 NOTE — Evaluation (Signed)
 Physical Therapy Evaluation Patient Details Name: Joy Tapia MRN: [id number] DOB: February 17, 1941 Today's Date: 02/12/2024  History of Present Illness  Pt is an 83 year old female presented to the hospital with nausea and vomiting.  She was diagnosed with NSTEMI, New onset HFrEF  Takotsubo cardiomyopathy, ?Pneumonia, bibasilar      PMH significant T2DM, HTN, CKDIIIb, dementia  Clinical Impression  Patient is agreeable to PT evaluation. She is from ALF and ambulates with a walker.  Today she reports feeling well and hopeful to return back to ALF soon. She did ambulate with supervision using rolling walker with no loss of balance. Mild dyspnea with exertion that subsides quickly with rest break. Anticipate patient could return to ALF and resume home health PT. Will continue to follow while in the hospital to maximize independence and decrease caregiver burden.       If plan is discharge home, recommend the following: Assist for transportation;Assistance with cooking/housework;Direct supervision/assist for medications management   Can travel by private vehicle        Equipment Recommendations None recommended by PT  Recommendations for Other Services       Functional Status Assessment Patient has had a recent decline in their functional status and demonstrates the ability to make significant improvements in function in a reasonable and predictable amount of time.     Precautions / Restrictions Precautions Precautions: Fall Recall of Precautions/Restrictions: Intact Restrictions Weight Bearing Restrictions Per Provider Order: No      Mobility  Bed Mobility               General bed mobility comments: not assessed as patient sitting up on arrival and post session    Transfers Overall transfer level: Needs assistance Equipment used: Rolling walker (2 wheels) Transfers: Sit to/from Stand Sit to Stand: Supervision           General transfer comment: no physical  assistance required    Ambulation/Gait Ambulation/Gait assistance: Contact guard assist, Supervision Gait Distance (Feet): 45 Feet Assistive device: Rolling walker (2 wheels) Gait Pattern/deviations: Step-through pattern Gait velocity: decreased     General Gait Details: cues for decreasing gait speed for safety and taking rest breaks as needed with longer distance ambulation due to fatigue with mobility. mild dyspnea with exertion that subsides with rest break. heart rate in the 90's after walking  Stairs            Wheelchair Mobility     Tilt Bed    Modified Rankin (Stroke Patients Only)       Balance Overall balance assessment: Needs assistance Sitting-balance support: Feet supported Sitting balance-Leahy Scale: Good     Standing balance support: Bilateral upper extremity supported, During functional activity, Reliant on assistive device for balance Standing balance-Leahy Scale: Fair Standing balance comment: no loss of balance with rolling walker for support                             Pertinent Vitals/Pain Pain Assessment Pain Assessment: No/denies pain    Home Living Family/patient expects to be discharged to:: Assisted living                 Home Equipment: Agricultural consultant (2 wheels);Cane - single point;Crutches;Shower seat;Hand held shower head;Grab bars - toilet;Grab bars - tub/shower      Prior Function Prior Level of Function : Needs assist             Mobility Comments:  amb with RW, no reported falls ADLs Comments: reports MOD I with ADL, assist for IADLs from facility     Extremity/Trunk Assessment   Upper Extremity Assessment Upper Extremity Assessment: Overall WFL for tasks assessed    Lower Extremity Assessment Lower Extremity Assessment: Generalized weakness RLE Deficits / Details: prior right trans met amputation       Communication   Communication Communication: No apparent difficulties    Cognition  Arousal: Alert Behavior During Therapy: WFL for tasks assessed/performed   PT - Cognitive impairments: Safety/Judgement                       PT - Cognition Comments: occasional safety cues with mobility Following commands: Intact       Cueing Cueing Techniques: Verbal cues     General Comments General comments (skin integrity, edema, etc.): HR 101 bpm, spo2 91% on RA post mobility    Exercises     Assessment/Plan    PT Assessment Patient needs continued PT services  PT Problem List Decreased strength;Decreased activity tolerance;Decreased balance;Decreased mobility;Decreased safety awareness       PT Treatment Interventions DME instruction;Gait training;Functional mobility training;Therapeutic activities;Therapeutic exercise;Balance training    PT Goals (Current goals can be found in the Care Plan section)  Acute Rehab PT Goals Patient Stated Goal: back to [facility] PT Goal Formulation: With patient Time For Goal Achievement: 02/26/24 Potential to Achieve Goals: Good    Frequency Min 2X/week     Co-evaluation               AM-PAC PT 6 Clicks Mobility  Outcome Measure Help needed turning from your back to your side while in a flat bed without using bedrails?: None Help needed moving from lying on your back to sitting on the side of a flat bed without using bedrails?: A Little Help needed moving to and from a bed to a chair (including a wheelchair)?: A Little Help needed standing up from a chair using your arms (e.g., wheelchair or bedside chair)?: A Little Help needed to walk in hospital room?: A Little Help needed climbing 3-5 steps with a railing? : A Little 6 Click Score: 19    End of Session   Activity Tolerance: Patient tolerated treatment well Patient left: in chair;with call bell/phone within reach;with chair alarm set   PT Visit Diagnosis: Muscle weakness (generalized) (M62.81);Unsteadiness on feet (R26.81)    Time: 8995-8985 PT  Time Calculation (min) (ACUTE ONLY): 10 min   Charges:   PT Evaluation $PT Eval Low Complexity: 1 Low   PT General Charges $$ ACUTE PT VISIT: 1 Visit         Randine Essex, PT, MPT   Randine LULLA Essex 02/12/2024, 12:02 PM

## 2024-02-12 NOTE — TOC Initial Note (Signed)
 Transition of Care [facility]) - Initial/Assessment Note    Patient Details  Name: Joy Tapia MRN: [id number] Date of Birth: Sep 30, 1940  [facility]) CM/SW Contact:    Lauraine JAYSON Carpen, LCSW Phone Number: 02/12/2024, 1:45 PM  Clinical Narrative:  Patient not fully oriented. HCPOA at bedside. CSW introduced role and explained that discharge planning would be discussed. Patient lives at [facility]. Beth at [facility] is aware PT and OT are recommending home health. No further concerns. CSW will continue to follow patient for support and facilitate return to ALF once medically stable. HCPOA will likely transport at discharge.                 Expected Discharge Plan: Assisted Living (with home health) Barriers to Discharge: Continued Medical Work up   Patient Goals and CMS Choice            Expected Discharge Plan and Services     Post Acute Care Choice: Resumption of Svcs/PTA Provider Living arrangements for the past 2 months: Assisted Living Facility                           HH Arranged: RN, PT, OT [facility] Agency: [facility] Date [facility] Agency Contacted: 02/11/24   Representative spoke with at [facility] Agency: Leotis  Prior Living Arrangements/Services Living arrangements for the past 2 months: Assisted Living Facility Lives with:: Facility Resident Patient language and need for interpreter reviewed:: Yes Do you feel safe going back to the place where you live?: Yes      Need for Family Participation in Patient Care: Yes (Comment) Care giver support system in place?: Yes (comment) Current home services: Home OT, Home PT, Home RN Criminal Activity/Legal Involvement Pertinent to Current Situation/Hospitalization: No - Comment as needed  Activities of Daily Living   ADL Screening (condition at time of admission) Independently performs ADLs?: No Is the patient deaf or have difficulty hearing?: No Does the patient have difficulty seeing, even when wearing  glasses/contacts?: Yes Does the patient have difficulty concentrating, remembering, or making decisions?: Yes  Permission Sought/Granted Permission sought to share information with : Magazine features editor, Family Supports       Permission granted to share info w AGENCY: [facility], [facility]  Permission granted to share info w Relationship: HCPOA     Emotional Assessment Appearance:: Appears stated age Attitude/Demeanor/Rapport: Unable to Assess Affect (typically observed): Appropriate, Calm, Pleasant Orientation: : Oriented to Self, Oriented to Place Alcohol / Substance Use: Not Applicable Psych Involvement: No (comment)  Admission diagnosis:  Dehydration [E86.0] Starvation ketoacidosis [T73.0XXA, E87.29] Thyroid  nodule [E04.1] SOB (shortness of breath) [R06.02] Pulmonary nodules [R91.8] ACS (acute coronary syndrome) (HCC) [I24.9] Burning chest pain [R07.89] NSTEMI (non-ST elevated myocardial infarction) (HCC) [I21.4] Intractable nausea and vomiting [R11.2] Nausea and vomiting, unspecified vomiting type [R11.2] Patient Active Problem List   Diagnosis Date Noted   Intractable nausea and vomiting 02/10/2024   ACS (acute coronary syndrome) (HCC) 02/10/2024   NSTEMI (non-ST elevated myocardial infarction) (HCC) 02/10/2024   Abscess of left foot 10/02/2023   Cellulitis of left foot 10/01/2023   Type 2 diabetes mellitus with hyperglycemia, with long-term current use of insulin  (HCC) 10/01/2023   Acute kidney injury superimposed on chronic kidney disease (HCC) 10/01/2023   Malnutrition of moderate degree 08/05/2023   Hyperosmolar hyperglycemic state (HHS) (HCC) 08/04/2023   Cellulitis of both lower extremities 08/04/2023   Equinus deformity of right foot 06/16/2023  Ulcer of left foot with fat layer exposed (HCC) 06/16/2023   Diabetic foot infection (HCC) 06/13/2023   Cellulitis 04/26/2023   Fracture of patella 03/05/2023   Hoarding disorder  07/02/2022   CKD (chronic kidney disease) stage 4, GFR 15-29 ml/min (HCC) 06/29/2022   Hypertension associated with diabetes (HCC) 06/29/2022   Osteomyelitis (HCC) 06/28/2022   Diabetes mellitus (HCC) 04/29/2022   Mild cognitive impairment 06/26/2021   Non-compliance 04/11/2019   Health care maintenance 09/15/2014   Gout 12/25/2013   Hyperlipidemia associated with type 2 diabetes mellitus (HCC) 12/25/2013   Hypertension in stage 4 chronic kidney disease due to type 2 diabetes mellitus (HCC) 12/25/2013   Osteoporosis 12/25/2013   PCP:  Shelley Loring, Pllc Pharmacy:   [facility] - [place], [place] - [address] [place] [address] Phone: [phone] Fax: [phone]  [id number] [place], [place] - [address] AT [facility] [address] [place] [address] Phone: [phone] Fax: [phone]     Social Drivers of Health (SDOH) Social History: SDOH Screenings   Food Insecurity: No Food Insecurity (02/10/2024)  Housing: Low Risk  (02/10/2024)  Transportation Needs: No Transportation Needs (02/10/2024)  Utilities: Not At Risk (02/10/2024)  Social Connections: Socially Isolated (02/10/2024)  Tobacco Use: Medium Risk (02/09/2024)   SDOH Interventions:     Readmission Risk Interventions    04/28/2023    3:08 PM  Readmission Risk Prevention Plan  Transportation Screening Complete  PCP or Specialist Appt within 5-7 Days Complete  Home Care Screening Complete  Medication Review (RN CM) Complete

## 2024-02-12 NOTE — Progress Notes (Signed)
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 sizable effusion is seen. Scattered parenchymal nodules are noted bilaterally. The largest of these lies within the right middle lobe best seen on image number 63 of series 5 measuring 3 mm in dimension. Upper Abdomen: Visualized upper abdomen shows no acute abnormality. Musculoskeletal: Degenerative changes of the thoracic spine are noted. Review of the MIP images confirms the above findings. IMPRESSION: No evidence of pulmonary emboli. Incidental left thyroid  nodule measuring 1.9 cm. Recommend non-emergent thyroid  ultrasound. Reference: J Am Coll Radiol. 2015 Feb;12(2): 143-50 Scattered parenchymal nodules are noted throughout both lungs. The largest of these measures 3 mm. No follow-up needed if patient is low-risk (and has no known or suspected primary neoplasm). Non-contrast chest CT can be considered in 12 months if patient is high-risk. This recommendation follows the consensus statement: Guidelines for Management of Incidental Pulmonary Nodules Detected on CT Images: From the Fleischner Society 2017; Radiology 2017; 284:228-243. Aortic Atherosclerosis  (ICD10-I70.0). Electronically Signed   By: Oneil Devonshire M.D.   On: 02/09/2024 22:45   DG Chest Port 1 View Result Date: 02/09/2024 CLINICAL DATA:  Shortness of breath EXAM: PORTABLE CHEST 1 VIEW COMPARISON:  08/04/2023 FINDINGS: Cardiac shadow is within normal limits. Lungs are well aerated bilaterally. Elevation of the right hemidiaphragm is again seen. No acute infiltrate is noted. No bony abnormality is seen. IMPRESSION: No acute abnormality noted. Electronically Signed   By: Oneil Devonshire M.D.   On: 02/09/2024 19:47    ECHO as above.  TELEMETRY reviewed by me 02/12/2024: Sinus rhythm, rate 90  EKG reviewed by me: Sinus rhythm, rate 69 bpm without acute ischemic changes.  Data reviewed by me 02/12/2024: last 24h vitals tele labs imaging I/O hospitalist progress notes.  Principal Problem:   Intractable nausea and vomiting Active Problems:   Hypertension associated with diabetes (HCC)   Type 2 diabetes mellitus with hyperglycemia, with long-term current use of insulin  (HCC)  ACS (acute coronary syndrome) (HCC)   NSTEMI (non-ST elevated myocardial infarction) (HCC)    ASSESSMENT AND PLAN:  Joy Tapia is a 83 y.o. female  from [place] with a past medical history of dementia, report hx of tobacco use, hypertension, CKD 3B, type 2 diabetes mellitus who presented to the ED on 02/09/2024 for persistent nausea and vomiting and back pain. Patient denies any chest pain. Patient denies any history of CAD, MI or HF.  Cardiology was consulted for further evaluation.   # NSTEMI # Takotsubo Cardiomyopathy # Acute HFrEF (EF 20-25%) # Hypertension # AKI on CKD 3b # Dementia  Patient denies chest pain. EKG without acute ischemic changes. Troponins elevated and trending 70 > 90 > 2400 > 3000 > 5500 > 4000. Echo revealed newly reduced EF 20-25% with apical ballooning with hyperkinetic bases consistent with Takotsubo CM, grade II diastolic dysfunction. 09/16 with BNP increased 230 > 2000. Repeat  CT with pleural effusions. Patient appears near euvolemia. -Continue IV heparin  infusion for 48-72 hours for medical management.  -Continue aspirin  81 mg and Crestor  20 mg daily.  -Increased p.o. metoprolol  succinate to 50 mg daily. -losartan  held due to AKI. Ordered hydral 25 TID for BP control.  -Discontinued IV lasix  40mg  due to worsening renal function and volume status appears stable.  -Continue dapagliflozin  10 mg daily. (Copay $47) -Hold off on initiate spirolactone at this time due to GFR < 30, worsening renal function.   -Will plan to optimize GDMT as BP and renal function allows.  -Discussed R/ LHC vs medical management with patient's POA. With patients baseline dementia, CKD with poor GFR and poor baseline quality of life, unable to take care of herself its reasonable to treat patient conservatively with medical management at this time.  -Nephrology consulted, appreciate recommendations -Palliative consulted for GOC.   # Pneumonia # Leukocytosis, improving  -Management per primary team.    This patient's plan of care was discussed and created with Dr. Custovic and she is in agreement.  Signed: Dorene Comfort, PA-C  02/12/2024, 8:31 AM [facility]

## 2024-02-12 NOTE — Progress Notes (Signed)
 PHARMACY - ANTICOAGULATION CONSULT NOTE  Pharmacy Consult for heparin  Indication: chest pain/ACS  Allergies  Allergen Reactions   Atorvastatin Diarrhea    Allergy not listed on MAR    Azithromycin Other (See Comments)    Pt states burns her insides. Allergy not listed on MAR     Erythromycin Other (See Comments)    Unknown. Allergy not listed on MAR    Other Other (See Comments)    Adhesive bandage, Anesthesia Extension set  -  unknown. Allergy not listed on MAR      Propoxyphene Other (See Comments)    Allergy not listed on [facility]      Patient Measurements: Height: 4' 11 (149.9 cm) Weight: 58.5 kg (128 lb 15.5 oz) IBW/kg (Calculated) : 43.2 HEPARIN  DW (KG): 55.5  Vital Signs: Temp: 98.2 F (36.8 C) (09/18 0346) BP: 155/68 (09/18 0346) Pulse Rate: 94 (09/18 0346)  Labs: Recent Labs    02/10/24 0159 02/10/24 0259 02/10/24 9356 02/10/24 0806 02/10/24 1701 02/10/24 2019 02/11/24 0016 02/11/24 0320 02/11/24 1217 02/12/24 0510  HGB  --  14.3  --   --   --   --   --  11.3*  --  10.6*  HCT  --  42.8  --   --   --   --   --  34.1*  --  32.7*  PLT  --  352  --   --   --   --   --  262  --  210  HEPARINUNFRC  --   --   --   --    < >  --   --  0.45 0.47 0.25*  CREATININE 1.40*  --   --  1.49*  --   --   --  1.96*  --   --   TROPONINIHS  --   --    < > 3,005*  --  5,541* 4,682*  --   --   --    < > = values in this interval not displayed.    Estimated Creatinine Clearance: 17.2 mL/min (A) (by C-G formula based on SCr of 1.96 mg/dL (H)).   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   Taking As Needed   amLODipine  (NORVASC ) 5 MG tablet Take 1 tablet (5 mg total) by mouth daily. 30 tablet 0 Past Month   aspirin  EC 81 MG tablet Take 81 mg by mouth every morning.   02/09/2024   cetirizine (ZYRTEC) 10 MG tablet Take 10 mg by mouth every morning.   02/09/2024   cloNIDine  (CATAPRES ) 0.2 MG tablet Take 0.2 mg by mouth  daily.   Taking   famotidine (PEPCID) 20 MG tablet Take 20 mg by mouth daily.   Taking   Febuxostat  80 MG TABS Take 80 mg by mouth every morning.   02/09/2024   fluticasone  (FLONASE ) 50 MCG/ACT nasal spray Place 1 spray into both nostrils daily as needed for allergies or rhinitis.   Past Month   furosemide  (LASIX ) 40 MG tablet Take 1 tablet (40 mg total) by mouth 2 (two) times daily for 7 days, THEN 1 tablet (40 mg total) daily. 60 tablet 0 02/08/2024   glipiZIDE (GLUCOTROL) 5 MG tablet Take 5 mg by mouth daily.   02/09/2024   insulin  aspart (NOVOLOG ) 100 UNIT/ML injection Inject 8 Units into the skin 3 (three) times daily with meals. 10 mL 11 02/09/2024   insulin  glargine-yfgn (SEMGLEE ) 100 UNIT/ML injection Inject 0.16 mLs (16 Units total) into the skin at bedtime. 10 mL 11 Taking   liver oil-zinc  oxide (DESITIN) 40 % ointment Apply topically every 6 (six) hours as needed for irritation. 56.7 g 0 Taking As Needed   losartan  (COZAAR ) 50 MG tablet Take 50 mg by mouth daily.   02/08/2024   metoprolol  succinate (TOPROL -XL) 50 MG 24 hr tablet Take 50 mg by mouth every morning.   02/09/2024   Multiple Vitamin (MULTIVITAMIN) capsule Take 1 capsule by mouth every morning.   Taking   omeprazole (PRILOSEC)  20 MG capsule Take 20 mg by mouth every morning.   02/09/2024   oxyCODONE  (OXY IR/ROXICODONE ) 5 MG immediate release tablet Take 1 tablet (5 mg total) by mouth every 6 (six) hours as needed for moderate pain (pain score 4-6). 20 tablet 0 Taking As Needed   Vitamin D, Ergocalciferol, (DRISDOL) 1.25 MG (50000 UNIT) CAPS capsule Take 50,000 Units by mouth once a week.   02/09/2024   Insulin  Syringe-Needle U-100 (INSULIN  SYRINGE .5CC/28G) 28G X 1/2 0.5 ML MISC 1 Application by Does not apply route daily. 30 each 1    oxymetazoline (AFRIN) 0.05 % nasal spray Place 1 spray into both nostrils 2 (two) times daily as needed for congestion. (Patient not taking: Reported on 02/10/2024)   Not Taking   pravastatin  (PRAVACHOL ) 20 MG tablet Take 20 mg by mouth at bedtime.      vitamin B-12 (CYANOCOBALAMIN ) 500 MCG tablet Take 500 mcg by mouth every morning.       Assessment: Pharmacy consulted to dose heparin  in patient with chest pain/ACS.  Patient is not on anticoagulation prior to admission but received heparin  subcutaneous DVT prophylaxis 9/16 @ 0202.  CBC WNL Trop 2,432  Date Time Results Comments 0916 1701 HL 0.25 subtherapeutic 0917 0320 HL 0.45 therapeutic x 1 0917 1217 HL 0.47 Therapeutic x 2, rate 750 u/h 0918 0510 HL 0.25 Subtherapeutic  Goal of Therapy:  Heparin  level 0.3-0.7 units/ml Monitor platelets by anticoagulation protocol: Yes   Plan:  Bolus 800 units x 1 Increase heparin  infusion rate to 850 units/hr Recheck heparin  level in 8 hrs after rate change Monitor CBC daily while on heparin   Rankin CANDIE Dills, PharmD, [facility] 02/12/2024 6:18 AM

## 2024-02-12 NOTE — Evaluation (Signed)
 Occupational Therapy Evaluation Patient Details Name: Joy Tapia MRN: [id number] DOB: 1941-04-07 Today's Date: 02/12/2024   History of Present Illness   Pt is an 83 year old female presented to the hospital with nausea and vomiting.  She was diagnosed with NSTEMI, New onset HFrEF  Takotsubo cardiomyopathy, ?Pneumonia, bibasilar      PMH significant T2DM, HTN, CKDIIIb, dementia     Clinical Impressions Chart reviewed to date, pt greeted semi supine in bed, oriented to self and place, agreeable to OT evaluation. She demonstrates deficits in memory, follows commands appropriately. PTA she reports she lives at ALF, performs ADL with MOD I, has assist for IADLs. Pt presents with deficits in strength, endurance, activity tolerance, balance, cognition, affecting safe and optimal ADL completion. She performs bed mobility with supervision, STS with CGA-MIN A (from bed and to chair), amb in room approx 15' with RW with CGA. SET UP for grooming/feeding tasks. Pt is performing ADL below PLOF, will benefit from acute OT to address deficits and to facilitate optimal ADL performance.      If plan is discharge home, recommend the following:   A little help with walking and/or transfers;A little help with bathing/dressing/bathroom     Functional Status Assessment   Patient has had a recent decline in their functional status and demonstrates the ability to make significant improvements in function in a reasonable and predictable amount of time.     Equipment Recommendations   None recommended by OT;Other (comment) (pt has recommended equipment)     Recommendations for Other Services         Precautions/Restrictions   Precautions Precautions: Fall Recall of Precautions/Restrictions: Intact Restrictions Weight Bearing Restrictions Per Provider Order: No     Mobility Bed Mobility Overal bed mobility: Needs Assistance Bed Mobility: Supine to Sit     Supine to sit:  Supervision          Transfers Overall transfer level: Needs assistance Equipment used: Rolling walker (2 wheels) Transfers: Sit to/from Stand Sit to Stand: Contact guard assist                  Balance Overall balance assessment: Needs assistance Sitting-balance support: Feet supported Sitting balance-Leahy Scale: Good     Standing balance support: Bilateral upper extremity supported, During functional activity, Reliant on assistive device for balance Standing balance-Leahy Scale: Poor (does not have specialized shoe that she wears at home here)                             ADL either performed or assessed with clinical judgement   ADL Overall ADL's : Needs assistance/impaired Eating/Feeding: Set up;Sitting   Grooming: Set up;Sitting               Lower Body Dressing: Minimal assistance Lower Body Dressing Details (indicate cue type and reason): anticipate Toilet Transfer: Contact guard assist;Minimal assistance;Rolling walker (2 wheels);Ambulation Toilet Transfer Details (indicate cue type and reason): simulated to bedside chair         Functional mobility during ADLs: Contact guard assist;Rolling walker (2 wheels) (approx 15' in room)       Vision Patient Visual Report: No change from baseline       Perception         Praxis         Pertinent Vitals/Pain Pain Assessment Pain Assessment: No/denies pain     Extremity/Trunk Assessment Upper Extremity Assessment Upper Extremity Assessment: Overall [facility]  for tasks assessed   Lower Extremity Assessment Lower Extremity Assessment: Defer to PT evaluation;RLE deficits/detail RLE Deficits / Details: baseline R transmet amputation       Communication Communication Communication: No apparent difficulties   Cognition Arousal: Alert Behavior During Therapy: WFL for tasks assessed/performed Cognition: History of cognitive impairments, Cognition impaired, No family/caregiver present to  determine baseline   Orientation impairments: Time, Situation   Memory impairment (select all impairments): Short-term memory, Declarative long-term memory Attention impairment (select first level of impairment): Selective attention                     Following commands: Intact       Cueing  General Comments   Cueing Techniques: Verbal cues  HR 101 bpm, spo2 91% on RA post mobility   Exercises Other Exercises Other Exercises: edu re role of OT, role of rehab, discharge recommendations, safe ADL completion   Shoulder Instructions      Home Living Family/patient expects to be discharged to:: Assisted living                             Home Equipment: Rolling Walker (2 wheels);Cane - single point;Crutches;Shower seat;Hand held shower head;Grab bars - toilet;Grab bars - tub/shower          Prior Functioning/Environment Prior Level of Function : Needs assist             Mobility Comments: amb with RW, no reported falls ADLs Comments: reports MOD I with ADL, assist for IADLs from facility    OT Problem List: Decreased strength;Decreased activity tolerance;Decreased cognition;Decreased knowledge of use of DME or AE;Impaired balance (sitting and/or standing);Decreased knowledge of precautions   OT Treatment/Interventions: Self-care/ADL training;Energy conservation;Balance training;Therapeutic exercise;DME and/or AE instruction;Patient/family education;Therapeutic activities      OT Goals(Current goals can be found in the care plan section)   Acute Rehab OT Goals Patient Stated Goal: improve function OT Goal Formulation: With patient Time For Goal Achievement: 02/26/24 Potential to Achieve Goals: Good   OT Frequency:  Min 2X/week    Co-evaluation              AM-PAC OT 6 Clicks Daily Activity     Outcome Measure Help from another person eating meals?: None Help from another person taking care of personal grooming?: None Help from  another person toileting, which includes using toliet, bedpan, or urinal?: A Little Help from another person bathing (including washing, rinsing, drying)?: A Little Help from another person to put on and taking off regular upper body clothing?: None Help from another person to put on and taking off regular lower body clothing?: A Little 6 Click Score: 21   End of Session Equipment Utilized During Treatment: Rolling walker (2 wheels) Nurse Communication: Mobility status  Activity Tolerance: Patient tolerated treatment well Patient left: in chair;with call bell/phone within reach;with chair alarm set  OT Visit Diagnosis: Other abnormalities of gait and mobility (R26.89)                Time: 9099-9086 OT Time Calculation (min): 13 min Charges:  OT General Charges $OT Visit: 1 Visit OT Evaluation $OT Eval Moderate Complexity: 1 Mod  Therisa Sheffield, OTD OTR/L  02/12/24, 10:24 AM

## 2024-02-12 NOTE — Plan of Care (Signed)
  Problem: Coping: Goal: Ability to adjust to condition or change in health will improve Outcome: Progressing   Problem: Fluid Volume: Goal: Ability to maintain a balanced intake and output will improve Outcome: Progressing   Problem: Metabolic: Goal: Ability to maintain appropriate glucose levels will improve Outcome: Progressing   Problem: Nutritional: Goal: Maintenance of adequate nutrition will improve Outcome: Progressing   Problem: Skin Integrity: Goal: Risk for impaired skin integrity will decrease Outcome: Progressing   Problem: Tissue Perfusion: Goal: Adequacy of tissue perfusion will improve Outcome: Progressing   Problem: Education: Goal: Knowledge of General Education information will improve Description: Including pain rating scale, medication(s)/side effects and non-pharmacologic comfort measures Outcome: Progressing   Problem: Clinical Measurements: Goal: Ability to maintain clinical measurements within normal limits will improve Outcome: Progressing Goal: Will remain free from infection Outcome: Progressing Goal: Diagnostic test results will improve Outcome: Progressing Goal: Cardiovascular complication will be avoided Outcome: Progressing   Problem: Activity: Goal: Risk for activity intolerance will decrease Outcome: Progressing   Problem: Coping: Goal: Level of anxiety will decrease Outcome: Progressing   Problem: Elimination: Goal: Will not experience complications related to urinary retention Outcome: Progressing   Problem: Pain Managment: Goal: General experience of comfort will improve and/or be controlled Outcome: Progressing   Problem: Safety: Goal: Ability to remain free from injury will improve Outcome: Progressing   Problem: Skin Integrity: Goal: Risk for impaired skin integrity will decrease Outcome: Progressing   Problem: Activity: Goal: Ability to tolerate increased activity will improve Outcome: Progressing   Problem:  Clinical Measurements: Goal: Ability to maintain a body temperature in the normal range will improve Outcome: Progressing

## 2024-02-12 NOTE — Progress Notes (Signed)
 Progress Note   Patient: Joy Tapia FMW:[id number] DOB: 03-14-1941 DOA: 02/09/2024     2 DOS: the patient was seen and examined on 02/12/2024   Brief hospital course:  83 y.o. female with medical history significant of  T2DM, HTN, CKDIIIb, dementia sent from assisted living facility to ED for evaluation of nausea and vomiting x 1 day.  She denies abdominal pain, fevers, SOB.  She has chronic diarrhea which she reports is at baseline.  Since she has been vomiting, she has developed non radiating substernal chest pain and worsening of her chronic low back pain.  ... In the ED, she was hypertensive up to 226/89, afebrile  saturating appropriately on room air.  Slight leukocytosis of 13.7, lactic acid 3.3.  Initial troponin 77>98, EKG nonischemic and stable compared to prior.  COVID flu and RSV negative...  See H&P for full HPI on admission & ED course.    Further history obtained from friends POA Donny and Teyonna who reported that as per Fredick, she was throwing up for 2 days and chest pain last night as ALF and had chest pain and other time indigestion. Has been on PPI and has been off mid August and on Pepcid till Aug 28. They are all friends from [place] and was pt was reportedly living in bad situation and they moved her to local ALF in August.  They don't know her medical history She does have dementia moderate to severe per POA based on her assessment.   Patient was admitted for further evaluation and management as outlined in detail below.  In summary, pt's troponin continued to trend upwards above 2k, with repeat EKG 9/16 AM concerning for ischemic changes.   Cardiology was urgently consulted.  Pt was given aspirin  325 mg and started on IV heparin .  On Echo, findings consistent with Takotsubo cardiomyopathy with newly reduced EF of 20-25% with apical ballooning and hyperkinetic base.   Assessment and Plan:  NSTEMI  New onset HFrEF Takotsubo cardiomyopathy Persistent  nausea -- now resolved --Cardiology following --On heparin  drip x 48-72 hours --ASA 81 mg, Crestor  20 mg daily --metoprolol  reduced to 25 mg daily --losartan  on hold with borderline BP's, resume when able --Lasix  d/c'd with rising Cr, last IV dose 9/17 --Monitor volume status - Io's, daily weights --Further GDMT as BP allows --R/L heart cath deferred for medical management after discussions were had with her POA, given baseline dementia, CKD, declining baseline quality of life --Palliative care consulted for GOC     Leukocytosis: ?Pneumonia, bibasilar UA is unremarkable for UTI COVID RSV influenza has been negative. Suspected reactive at this time in setting of acute CHF and NSTEMI, however CT abdomen showed patchy ground glass and confluent airspace disease in both lower lungs with bronchial wall thickening. --Follow blood cultures --Started on empiric ceftriaxone  doxycycline  for CAP coverage - continue antibiotics --Sputum culture sent was not acceptable for testing --Continue empiric antibiotics --Consider SLP for swallow evaluation for ? Aspiration   Hypomagnesemia - Mg 1.6 was replaced on 9/17 --Maintain Mg>2  HTN urgency - on admission, BP's now soft Tachycardia: --Medications as above --Monitor HR's on telemetry    Nausea and vomiting - resolved Diarrhea - chronic intermittent Ketonuria: Abdominal exam benign,CT abdomen was pending> subsequently resulted without acute finding on abdomen LFTs lipase normal --Continue supportive care    Agitation - improved Dementia at baseline: CT head negative, abdominal exam benign. COVID/flu/RSV negative.  No obvious infection although has leukocytosis lactic acidosis She is alert awake  oriented x 3.  --PRN safety sitter --Delirium precautions    Lactic acidosis  - resolved likely from new onset CHF Concern for multifocal pneumonia based on CT abdomen vs fluid overload: Unclear etiology could be in the setting of #1-w/  MI vs from intractable nausea vomiting and hyperglycemia   Type 2 Diabetes with uncontrolled hyperglycemia: CBG's into 400's earlier in admission, today in mid 100's.  PTA on glipizide, Semglee  22 units bedtime and 8 units Premeal Novolog .   A1c 9% --Continue Lantus  8 units at bedtime, sliding scale Novolog  0-15 units Q4H  AKI superimposed on CKD3b: Baseline creatinine has been in the range of 1.2-1.5. Today Cr up to 1.96 >> 2.00 Aki due to IV contrast exposure and NSAID use prior to admission --Hold nephrotoxins, NSAID's, further IV contrast --Monitor closely with diuresis --Will consider gentle fluids if worsening tomorrow --Nephrology following   HLD: Cont  pravastatin    Chronic opiate use Chronic back pain: On chronic oxycodone  5 mg q 6 hr prn   Incidental left thyroid  nodule 1.9 cm: Advised for nonemergent thyroid  ultrasound   Scattered parenchymal nodules throughout the lungs: Largest 3 mm no follow-up needed for low risk      Subjective: Pt seen up in recliner this AM, no visitors at bedside during my encounter.  Pt reports feeling well, is in good spirits, expresses appreciate for everyone's care.  She reports back pain, is slid down in the chair, states she's been up in chair all morning.  RN reported  noticing cough this AM   Physical Exam: Vitals:   02/12/24 0917 02/12/24 1147 02/12/24 1147 02/12/24 1552  BP: (!) 157/71 (!) 140/66 (!) 140/66 (!) 155/64  Pulse: 100 86 86 88  Resp: (!) 23 18 18 18   Temp: 98 F (36.7 C) 99.1 F (37.3 C) 99.1 F (37.3 C) 99.6 F (37.6 C)  TempSrc:  Oral Oral   SpO2: 100% 98% 98% 99%  Weight:      Height:       General exam: awake, alert, no acute distress, pleasantly confused, smiling HEENT: moist mucus membranes, hearing grossly normal  Respiratory system: CTAB diminished bases, no wheezes, rales or rhonchi, normal respiratory effort. Cardiovascular system: normal S1/S2, RRRm trace peripheral edema Gastrointestinal  system: soft, NT, ND Central nervous system: A&O x self and hospital. no gross focal neurologic deficits, normal speech Skin: dry, intact, normal temperature Psychiatry: normal mood, congruent affect, abnormal judgement and insight due to dementia    Data Reviewed:  Notable labs -- bicarb 20, glucose 130, BUN 31 Cr 1.96 >> 2.00, Ca 8.0,  WBC improved 15.5, Hbg 10.6   Family Communication: Alyse Raspberry at bedside on rounds 9/17.    Disposition: Status is: Inpatient Remains inpatient appropriate because: 71 mi nutes  Planned Discharge Destination: Return to ALF versus SNF/rehab     Time spent: 40 minutes  Author: Burnard DELENA Cunning, DO 02/12/2024 6:56 PM  For on call review [email or web address].

## 2024-02-12 NOTE — Progress Notes (Signed)
 [facility]  ROUNDING NOTE   Subjective:   Patient seen sitting up in bed Alert and oriented Feeding herself breakfast Room air  Creatinine 2.00 UOP 1L  Objective:  Vital signs in last 24 hours:  Temp:  [98 F (36.7 C)-99.1 F (37.3 C)] 99.1 F (37.3 C) (09/18 1147) Pulse Rate:  [81-100] 86 (09/18 1147) Resp:  [17-23] 18 (09/18 1147) BP: (139-157)/(62-71) 140/66 (09/18 1147) SpO2:  [92 %-100 %] 98 % (09/18 1147)  Weight change:  Filed Weights   02/09/24 1814 02/10/24 2129  Weight: 59 kg 58.5 kg    Intake/Output: I/O last 3 completed shifts: In: 2457 [P.O.:970; I.V.:364.7; IV Piggyback:1122.3] Out: 1000 [Urine:1000]   Intake/Output this shift:  Total I/O In: 180 [P.O.:180] Out: 200 [Urine:200]  Physical Exam: General: NAD  Head: Normocephalic, atraumatic. Moist oral mucosal membranes  Eyes: Anicteric  Lungs:  diminished, normal effort  Heart: Regular rate and rhythm  Abdomen:  Soft, nontender  Extremities: No peripheral edema.  Neurologic: Awake, alert, conversant  Skin: Warm,dry, no rash       Basic Metabolic Panel: Recent Labs  Lab 02/09/24 1851 02/10/24 0159 02/10/24 0806 02/11/24 0320 02/12/24 0510  NA 137 133* 139 139 139  K 4.6 4.5 3.9 4.3 3.8  CL 103 98 100 104 109  CO2 17* 16* 19* 22 20*  GLUCOSE 178* 427* 216* 145* 139*  BUN 32* 26* 25* 28* 31*  CREATININE 1.40* 1.40* 1.49* 1.96* 2.00*  CALCIUM  9.2 9.2 9.6 8.2* 8.0*  MG  --   --   --  1.6* 2.2  PHOS  --   --   --  3.9  --     Liver Function Tests: Recent Labs  Lab 02/09/24 1851 02/10/24 0159 02/11/24 0320  AST 62* 53* 45*  ALT 36 36 25  ALKPHOS 111 111 79  BILITOT 1.1 1.0 0.8  PROT 7.4 7.5 5.7*  ALBUMIN 4.0 3.8 2.9*   Recent Labs  Lab 02/09/24 1851  LIPASE 50   No results for input(s): AMMONIA in the last 168 hours.  CBC: Recent Labs  Lab 02/09/24 1851 02/10/24 0259 02/11/24 0320 02/12/24 0510  WBC 13.7* 17.7* 20.0* 15.5*  HGB 14.4 14.3 11.3*  10.6*  HCT 44.6 42.8 34.1* 32.7*  MCV 83.2 80.6 82.4 84.3  PLT 303 352 262 210    Cardiac Enzymes: No results for input(s): CKTOTAL, CKMB, CKMBINDEX, TROPONINI in the last 168 hours.  BNP: Invalid input(s): POCBNP  CBG: Recent Labs  Lab 02/11/24 2333 02/12/24 0342 02/12/24 0752 02/12/24 0905 02/12/24 1149  GLUCAP 102* 135* 108*  108* 127* 227*    Microbiology: Results for orders placed or performed during the hospital encounter of 02/09/24  Resp panel by RT-PCR (RSV, Flu A&B, Covid) Anterior Nasal Swab     Status: None   Collection Time: 02/09/24  7:16 PM   Specimen: Anterior Nasal Swab  Result Value Ref Range Status   SARS Coronavirus 2 by RT PCR NEGATIVE NEGATIVE Final    Comment: (NOTE) SARS-CoV-2 target nucleic acids are NOT DETECTED.  The SARS-CoV-2 RNA is generally detectable in upper respiratory specimens during the acute phase of infection. The lowest concentration of SARS-CoV-2 viral copies this assay can detect is 138 copies/mL. A negative result does not preclude SARS-Cov-2 infection and should not be used as the sole basis for treatment or other patient management decisions. A negative result may occur with  improper specimen collection/handling, submission of specimen other than nasopharyngeal swab, presence of  viral mutation(s) within the areas targeted by this assay, and inadequate number of viral copies(<138 copies/mL). A negative result must be combined with clinical observations, patient history, and epidemiological information. The expected result is Negative.  Fact Sheet for Patients:  BloggerCourse.com  Fact Sheet for Healthcare Providers:  SeriousBroker.it  This test is no t yet approved or cleared by the [place]  FDA and  has been authorized for detection and/or diagnosis of SARS-CoV-2 by FDA under an Emergency Use Authorization (EUA). This EUA will remain  in effect (meaning  this test can be used) for the duration of the COVID-19 declaration under Section 564(b)(1) of the Act, 21 U.S.C.section 360bbb-3(b)(1), unless the authorization is terminated  or revoked sooner.       Influenza A by PCR NEGATIVE NEGATIVE Final   Influenza B by PCR NEGATIVE NEGATIVE Final    Comment: (NOTE) The Xpert Xpress SARS-CoV-2/FLU/RSV plus assay is intended as an aid in the diagnosis of influenza from Nasopharyngeal swab specimens and should not be used as a sole basis for treatment. Nasal washings and aspirates are unacceptable for Xpert Xpress SARS-CoV-2/FLU/RSV testing.  Fact Sheet for Patients: BloggerCourse.com  Fact Sheet for Healthcare Providers: SeriousBroker.it  This test is not yet approved or cleared by the [place]  FDA and has been authorized for detection and/or diagnosis of SARS-CoV-2 by FDA under an Emergency Use Authorization (EUA). This EUA will remain in effect (meaning this test can be used) for the duration of the COVID-19 declaration under Section 564(b)(1) of the Act, 21 U.S.C. section 360bbb-3(b)(1), unless the authorization is terminated or revoked.     Resp Syncytial Virus by PCR NEGATIVE NEGATIVE Final    Comment: (NOTE) Fact Sheet for Patients: BloggerCourse.com  Fact Sheet for Healthcare Providers: SeriousBroker.it  This test is not yet approved or cleared by the [place]  FDA and has been authorized for detection and/or diagnosis of SARS-CoV-2 by FDA under an Emergency Use Authorization (EUA). This EUA will remain in effect (meaning this test can be used) for the duration of the COVID-19 declaration under Section 564(b)(1) of the Act, 21 U.S.C. section 360bbb-3(b)(1), unless the authorization is terminated or revoked.  Performed at [facility], [address] [place], [place] [address]   Culture, blood (routine x  2)     Status: None (Preliminary result)   Collection Time: 02/10/24  1:59 AM   Specimen: BLOOD LEFT ARM  Result Value Ref Range Status   Specimen Description BLOOD LEFT ARM  Final   Special Requests   Final    BOTTLES DRAWN AEROBIC ONLY Blood Culture results may not be optimal due to an inadequate volume of blood received in culture bottles   Culture   Final    NO GROWTH 2 DAYS Performed at [facility], [address] [place], [place] [address]    Report Status PENDING  Incomplete  Culture, blood (routine x 2)     Status: None (Preliminary result)   Collection Time: 02/10/24  1:59 AM   Specimen: BLOOD LEFT HAND  Result Value Ref Range Status   Specimen Description BLOOD LEFT HAND  Final   Special Requests   Final    BOTTLES DRAWN AEROBIC ONLY Blood Culture results may not be optimal due to an inadequate volume of blood received in culture bottles   Culture   Final    NO GROWTH 2 DAYS Performed at [facility], [address], [place], [place] [address]    Report Status PENDING  Incomplete  Expectorated Sputum Assessment w Gram Stain, Rflx to Resp Cult     Status: None   Collection Time: 02/11/24  4:44 AM   Specimen: Expectorated Sputum  Result Value Ref Range Status   Specimen Description EXPECTORATED SPUTUM  Final   Special Requests NONE  Final   Sputum evaluation   Final    Sputum specimen not acceptable for testing.  Please recollect.   Joy MAROLYN HOE RN [phone] 02/11/24 HNM Performed at [facility], [address] [place], [place] [address]    Report Status 02/11/2024 FINAL  Final    Coagulation Studies: No results for input(s): LABPROT, INR in the last 72 hours.  Urinalysis: Recent Labs    02/09/24 2338  COLORURINE STRAW*  LABSPEC 1.015  PHURINE 6.0  GLUCOSEU 150*  HGBUR NEGATIVE  BILIRUBINUR NEGATIVE  KETONESUR 5*  PROTEINUR >=300*  NITRITE NEGATIVE  LEUKOCYTESUR NEGATIVE      Imaging: No results  found.    Medications:    cefTRIAXone  (ROCEPHIN )  IV 2 g (02/12/24 1221)   doxycycline  (VIBRAMYCIN ) IV 100 mg (02/12/24 1347)   promethazine  (PHENERGAN ) injection (IM or IVPB) Stopped (02/10/24 9364)    acidophilus  2 capsule Oral Daily   aspirin  EC  81 mg Oral Daily   dapagliflozin  propanediol  10 mg Oral Daily   dextromethorphan -guaiFENesin   1 tablet Oral BID   [START ON 02/13/2024] enoxaparin  (LOVENOX ) injection  30 mg Subcutaneous Q24H   hydrALAZINE   25 mg Oral Q8H   insulin  aspart  0-15 Units Subcutaneous Q4H   insulin  glargine  8 Units Subcutaneous QHS   metoprolol  succinate  50 mg Oral q morning   pantoprazole  (PROTONIX ) IV  40 mg Intravenous QHS   rosuvastatin   20 mg Oral Daily   acetaminophen  **OR** acetaminophen , guaiFENesin , loperamide , morphine  injection, oxyCODONE , promethazine  (PHENERGAN ) injection (IM or IVPB), sodium chloride , trimethobenzamide   Assessment/ Plan:  Ms. Joy Tapia is a 83 y.o.  female with medical problems of diabetes, hypertension, dementia, CKD was admitted on 02/09/2024 for Dehydration [E86.0] Starvation ketoacidosis [T73.0XXA, E87.29] Thyroid  nodule [E04.1] SOB (shortness of breath) [R06.02] Pulmonary nodules [R91.8] ACS (acute coronary syndrome) (HCC) [I24.9] Burning chest pain [R07.89] NSTEMI (non-ST elevated myocardial infarction) (HCC) [I21.4] Intractable nausea and vomiting [R11.2] Nausea and vomiting, unspecified vomiting type [R11.2]   AKI on Chronic kidney disease stage IIIb  - AKI likely secondary to IV contrast exposure. - Creatinine appears to have plateau. - Maintain oral intake.  - no acute indication for dialysis  - Avoid further IV contrast exposure, nonsteroidals. - Supportive care.  Lab Results  Component Value Date   CREATININE 2.00 (H) 02/12/2024   CREATININE 1.96 (H) 02/11/2024   CREATININE 1.49 (H) 02/10/2024    Intake/Output Summary (Last 24 hours) at 02/12/2024 1542 Last data filed at 02/12/2024  1221 Gross per 24 hour  Intake 1032.71 ml  Output 700 ml  Net 332.71 ml    2.  Lower extremity edema, acute systolic and diastolic CHF -Based on most recent echo, patient has LVEF 20 to 25%, severely decreased LV function, moderate to severe mitral regurgitation and moderate tricuspid regurgitation.,  Grade 2 diastolic dysfunction - Continue medical management per cardiology team - Volume status stable .- Furosemide  stopped    LOS: 2 Joy Tapia 9/18/20253:42 PM

## 2024-02-13 ENCOUNTER — Encounter: Payer: Self-pay | Admitting: Internal Medicine

## 2024-02-13 DIAGNOSIS — J189 Pneumonia, unspecified organism: Secondary | ICD-10-CM | POA: Diagnosis present

## 2024-02-13 DIAGNOSIS — N1832 Chronic kidney disease, stage 3b: Secondary | ICD-10-CM | POA: Diagnosis present

## 2024-02-13 DIAGNOSIS — I5021 Acute systolic (congestive) heart failure: Secondary | ICD-10-CM | POA: Diagnosis present

## 2024-02-13 DIAGNOSIS — E872 Acidosis, unspecified: Secondary | ICD-10-CM | POA: Diagnosis present

## 2024-02-13 DIAGNOSIS — R112 Nausea with vomiting, unspecified: Secondary | ICD-10-CM | POA: Diagnosis not present

## 2024-02-13 DIAGNOSIS — I5181 Takotsubo syndrome: Secondary | ICD-10-CM | POA: Diagnosis present

## 2024-02-13 LAB — CBC
HCT: 32.1 % — ABNORMAL LOW (ref 36.0–46.0)
Hemoglobin: 10.4 g/dL — ABNORMAL LOW (ref 12.0–15.0)
MCH: 27.2 pg (ref 26.0–34.0)
MCHC: 32.4 g/dL (ref 30.0–36.0)
MCV: 83.8 fL (ref 80.0–100.0)
Platelets: 211 K/uL (ref 150–400)
RBC: 3.83 MIL/uL — ABNORMAL LOW (ref 3.87–5.11)
RDW: 17.3 % — ABNORMAL HIGH (ref 11.5–15.5)
WBC: 16 K/uL — ABNORMAL HIGH (ref 4.0–10.5)
nRBC: 0 % (ref 0.0–0.2)

## 2024-02-13 LAB — BASIC METABOLIC PANEL WITH GFR
Anion gap: 8 (ref 5–15)
BUN: 26 mg/dL — ABNORMAL HIGH (ref 8–23)
CO2: 21 mmol/L — ABNORMAL LOW (ref 22–32)
Calcium: 8 mg/dL — ABNORMAL LOW (ref 8.9–10.3)
Chloride: 110 mmol/L (ref 98–111)
Creatinine, Ser: 1.65 mg/dL — ABNORMAL HIGH (ref 0.44–1.00)
GFR, Estimated: 31 mL/min — ABNORMAL LOW (ref >60)
Glucose, Bld: 123 mg/dL — ABNORMAL HIGH (ref 70–99)
Potassium: 4.1 mmol/L (ref 3.5–5.1)
Sodium: 139 mmol/L (ref 135–145)

## 2024-02-13 LAB — GLUCOSE, CAPILLARY
Glucose-Capillary: 122 mg/dL — ABNORMAL HIGH (ref 70–99)
Glucose-Capillary: 136 mg/dL — ABNORMAL HIGH (ref 70–99)
Glucose-Capillary: 139 mg/dL — ABNORMAL HIGH (ref 70–99)
Glucose-Capillary: 145 mg/dL — ABNORMAL HIGH (ref 70–99)

## 2024-02-13 MED ORDER — HYDRALAZINE HCL 25 MG PO TABS: 25.0000 mg | ORAL_TABLET | Freq: Three times a day (TID) | ORAL | Status: AC

## 2024-02-13 MED ORDER — TRAMADOL HCL 50 MG PO TABS: 50.0000 mg | ORAL_TABLET | Freq: Four times a day (QID) | ORAL | 0 refills | Status: AC | PRN

## 2024-02-13 MED ORDER — AMOXICILLIN-POT CLAVULANATE 875-125 MG PO TABS: 1.0000 | ORAL_TABLET | Freq: Two times a day (BID) | ORAL | Status: AC

## 2024-02-13 MED ORDER — RISAQUAD PO CAPS: 2.0000 | ORAL_CAPSULE | Freq: Every day | ORAL | Status: AC

## 2024-02-13 MED ORDER — OXYCODONE HCL 5 MG PO TABS: 5.0000 mg | ORAL_TABLET | Freq: Four times a day (QID) | ORAL | 0 refills | Status: DC | PRN

## 2024-02-13 MED ORDER — GUAIFENESIN 100 MG/5ML PO LIQD: 5.0000 mL | ORAL | Status: AC | PRN

## 2024-02-13 MED ORDER — INSULIN GLARGINE-YFGN 100 UNIT/ML ~~LOC~~ SOLN: 12.0000 [IU] | Freq: Every day | SUBCUTANEOUS | Status: AC

## 2024-02-13 MED ORDER — DAPAGLIFLOZIN PROPANEDIOL 10 MG PO TABS: 10.0000 mg | ORAL_TABLET | Freq: Every day | ORAL | Status: AC

## 2024-02-13 MED ORDER — DM-GUAIFENESIN ER 30-600 MG PO TB12: 1.0000 | ORAL_TABLET | Freq: Two times a day (BID) | ORAL | Status: AC

## 2024-02-13 MED ORDER — SALINE SPRAY 0.65 % NA SOLN: 2.0000 | NASAL | Status: AC | PRN

## 2024-02-13 MED ORDER — ROSUVASTATIN CALCIUM 20 MG PO TABS: 20.0000 mg | ORAL_TABLET | Freq: Every day | ORAL | Status: AC

## 2024-02-13 NOTE — TOC Transition Note (Signed)
 Transition of Care [facility]) - Discharge Note   Patient Details  Name: RHYLEIGH GRASSEL MRN: [id number] Date of Birth: 1941-05-07  Transition of Care [facility]) CM/SW Contact:  Racheal LITTIE Schimke, RN Phone Number: 02/13/2024, 11:20 AM   Final next level of care: Home w Home Health Services Barriers to Discharge: Barriers Resolved   Patient Goals and CMS Choice            Discharge Placement                Patient to be transferred to facility by: POA,Schweitzer,(Medical POA)Cathy Name of family member notified: Donny Sitter Patient and family notified of of transfer: 02/13/24  Discharge Plan and Services Additional resources added to the After Visit Summary for       Post Acute Care Choice: Resumption of Svcs/PTA Provider          DME Arranged: N/A DME Agency: NA       HH Arranged: RN, PT, OT, Nurse's Aide HH Agency: Other - See comment [place]) Date HH Agency Contacted: 02/13/24 Time HH Agency Contacted: 1119 Representative spoke with at [facility] Agency: [place]   Social Drivers of Health (SDOH) Interventions SDOH Screenings   Food Insecurity: No Food Insecurity (02/10/2024)  Housing: Low Risk  (02/10/2024)  Transportation Needs: No Transportation Needs (02/10/2024)  Utilities: Not At Risk (02/10/2024)  Social Connections: Socially Isolated (02/10/2024)  Tobacco Use: Medium Risk (02/09/2024)     Readmission Risk Interventions    04/28/2023    3:08 PM  Readmission Risk Prevention Plan  Transportation Screening Complete  PCP or Specialist Appt within 5-7 Days Complete  Home Care Screening Complete  Medication Review (RN CM) Complete

## 2024-02-13 NOTE — Progress Notes (Signed)
 Daily Progress Note   Patient Name: Joy Tapia       Date: 02/13/2024 DOB: 1940-09-29  Age: 83 y.o. MRN#: [id number] Attending Physician: Fausto Burnard LABOR, DO Primary Care Physician: Shelley Loring, Pllc Admit Date: 02/09/2024  Reason for Consultation/Follow-up: Establishing goals of care  Subjective: Notes and labs reviewed.  Spoke with attending regarding updates.  Discussed plans for discharging patient back to her facility.  Patient is currently resting with her stuffed dog which reminds her of a living dog that she once had, just beside her.  She denies complaint.  No family/friends/H POA at bedside.  As patient is discharging today, PMT will sign off.  Length of Stay: 3  Current Medications: Scheduled Meds:   acidophilus  2 capsule Oral Daily   aspirin  EC  81 mg Oral Daily   dapagliflozin  propanediol  10 mg Oral Daily   dextromethorphan -guaiFENesin   1 tablet Oral BID   enoxaparin  (LOVENOX ) injection  30 mg Subcutaneous Q24H   hydrALAZINE   25 mg Oral Q8H   insulin  aspart  0-15 Units Subcutaneous Q4H   insulin  glargine  8 Units Subcutaneous QHS   metoprolol  succinate  50 mg Oral q morning   pantoprazole  (PROTONIX ) IV  40 mg Intravenous QHS   rosuvastatin   20 mg Oral Daily    Continuous Infusions:  cefTRIAXone  (ROCEPHIN )  IV 2 g (02/13/24 1153)   promethazine  (PHENERGAN ) injection (IM or IVPB) 12.5 mg (02/12/24 1559)    PRN Meds: acetaminophen  **OR** acetaminophen , guaiFENesin , loperamide , morphine  injection, oxyCODONE , promethazine  (PHENERGAN ) injection (IM or IVPB), sodium chloride , trimethobenzamide   Physical Exam Pulmonary:     Effort: Pulmonary effort is normal.  Neurological:     Mental Status: She is alert.             Vital Signs: BP (!)  157/80 (BP Location: Left Wrist)   Pulse 97   Temp 98.6 F (37 C)   Resp (!) 21   Ht 4' 11 (1.499 m)   Wt 58.5 kg   SpO2 100%   BMI 26.05 kg/m  SpO2: SpO2: 100 % O2 Device: O2 Device: Room Air O2 Flow Rate: O2 Flow Rate (L/min): 1 L/min  Intake/output summary:  Intake/Output Summary (Last 24 hours) at 02/13/2024 1233 Last data filed at 02/13/2024 1100 Gross per 24 hour  Intake 735.09 ml  Output --  Net 735.09 ml   LBM: Last BM Date : 02/12/24 Baseline Weight: Weight: 59 kg Most recent weight: Weight: 58.5 kg    Patient Active Problem List   Diagnosis Date Noted   Takotsubo syndrome 02/13/2024   Acute HFrEF (heart failure with reduced ejection fraction) (HCC) 02/13/2024   CAP (community acquired pneumonia) 02/13/2024   CKD stage 3b, GFR 30-44 ml/min (HCC) 02/13/2024   Abscess of left foot 10/02/2023   Cellulitis of left foot 10/01/2023   Type 2 diabetes mellitus with hyperglycemia, with long-term current use of insulin  (HCC) 10/01/2023   AKI (acute kidney injury) (HCC) 10/01/2023   Malnutrition of moderate degree 08/05/2023   Hyperosmolar hyperglycemic state (HHS) (HCC) 08/04/2023   Cellulitis of both lower extremities 08/04/2023   Equinus deformity of right foot 06/16/2023   Ulcer of left foot with fat layer exposed (HCC) 06/16/2023   Diabetic foot infection (HCC) 06/13/2023   Cellulitis 04/26/2023   Fracture of patella 03/05/2023   Hoarding disorder 07/02/2022   CKD (chronic kidney disease) stage 4, GFR 15-29 ml/min (HCC) 06/29/2022   Hypertension associated with diabetes (HCC) 06/29/2022   Osteomyelitis (HCC) 06/28/2022   Diabetes mellitus (HCC) 04/29/2022   Dementia without behavioral disturbance (HCC) 06/26/2021   Non-compliance 04/11/2019   Health care maintenance 09/15/2014   Gout 12/25/2013   Hyperlipidemia associated with type 2 diabetes mellitus (HCC) 12/25/2013   Hypertension in stage 4 chronic kidney disease due to type 2 diabetes mellitus (HCC)  12/25/2013   Osteoporosis 12/25/2013      Recommendations/Plan: PMT will sign off this patient is discharging today.  Code Status:    Code Status Orders  (From admission, onward)           Start     Ordered   02/10/24 0100  Full code  Continuous       Question:  By:  Answer:  Consent: discussion documented in EHR   02/10/24 0103           Code Status History     Date Active Date Inactive Code Status Order ID Comments User Context   10/01/2023 2038 10/08/2023 1951 Full Code [id number]  Tobie Jorie SAUNDERS, MD Inpatient   08/04/2023 1418 08/07/2023 1940 Full Code [id number]  Celinda Alm Lot, MD ED   06/13/2023 0350 06/19/2023 2138 Full Code [id number]  Lonzell Emeline HERO, DO ED   04/26/2023 2138 04/29/2023 2149 Full Code [id number]  Dena Charleston, MD ED   10/04/2022 0821 10/04/2022 1415 Full Code [id number]  Ashley Soulier, DPM Inpatient   06/28/2022 1457 06/30/2022 1844 Full Code [id number]  Laurita Cort DASEN, MD ED   Thank you for allowing the Palliative Medicine Team to assist in the care of this patient.   Camelia Lewis, NP  Please contact Palliative Medicine Team phone at [phone] for questions and concerns.

## 2024-02-13 NOTE — Progress Notes (Signed)
 [facility]  ROUNDING NOTE   Subjective:   Patient seen sitting up in bed Alert and oriented Room air Serum creatinine has further improved to 1.65 today.     Objective:  Vital signs in last 24 hours:  Temp:  [98.6 F (37 C)-100 F (37.8 C)] 98.6 F (37 C) (09/19 0804) Pulse Rate:  [82-97] 97 (09/19 0804) Resp:  [16-21] 21 (09/19 0804) BP: (142-157)/(63-80) 157/80 (09/19 0804) SpO2:  [99 %-100 %] 100 % (09/19 0804)  Weight change:  Filed Weights   02/09/24 1814 02/10/24 2129  Weight: 59 kg 58.5 kg    Intake/Output: I/O last 3 completed shifts: In: 1111 [P.O.:400; I.V.:85.9; IV Piggyback:625.1] Out: 700 [Urine:700]   Intake/Output this shift:  Total I/O In: 240 [P.O.:240] Out: -   Physical Exam: General: NAD  Head: Normocephalic, atraumatic. Moist oral mucosal membranes  Eyes: Anicteric  Lungs:  diminished, normal effort  Heart: Regular rate and rhythm  Abdomen:  Soft, nontender  Extremities: Rt > left peripheral edema.  Neurologic: Awake, alert, conversant  Skin: Warm,dry, no rash       Basic Metabolic Panel: Recent Labs  Lab 02/10/24 0159 02/10/24 0806 02/11/24 0320 02/12/24 0510 02/13/24 0340  NA 133* 139 139 139 139  K 4.5 3.9 4.3 3.8 4.1  CL 98 100 104 109 110  CO2 16* 19* 22 20* 21*  GLUCOSE 427* 216* 145* 139* 123*  BUN 26* 25* 28* 31* 26*  CREATININE 1.40* 1.49* 1.96* 2.00* 1.65*  CALCIUM  9.2 9.6 8.2* 8.0* 8.0*  MG  --   --  1.6* 2.2  --   PHOS  --   --  3.9  --   --     Liver Function Tests: Recent Labs  Lab 02/09/24 1851 02/10/24 0159 02/11/24 0320  AST 62* 53* 45*  ALT 36 36 25  ALKPHOS 111 111 79  BILITOT 1.1 1.0 0.8  PROT 7.4 7.5 5.7*  ALBUMIN 4.0 3.8 2.9*   Recent Labs  Lab 02/09/24 1851  LIPASE 50   No results for input(s): AMMONIA in the last 168 hours.  CBC: Recent Labs  Lab 02/09/24 1851 02/10/24 0259 02/11/24 0320 02/12/24 0510 02/13/24 0340  WBC 13.7* 17.7* 20.0* 15.5* 16.0*  HGB  14.4 14.3 11.3* 10.6* 10.4*  HCT 44.6 42.8 34.1* 32.7* 32.1*  MCV 83.2 80.6 82.4 84.3 83.8  PLT 303 352 262 210 211    Cardiac Enzymes: No results for input(s): CKTOTAL, CKMB, CKMBINDEX, TROPONINI in the last 168 hours.  BNP: Invalid input(s): POCBNP  CBG: Recent Labs  Lab 02/12/24 1951 02/13/24 0054 02/13/24 0508 02/13/24 0809 02/13/24 1153  GLUCAP 233* 139* 122* 136* 145*    Microbiology: Results for orders placed or performed during the hospital encounter of 02/09/24  Resp panel by RT-PCR (RSV, Flu A&B, Covid) Anterior Nasal Swab     Status: None   Collection Time: 02/09/24  7:16 PM   Specimen: Anterior Nasal Swab  Result Value Ref Range Status   SARS Coronavirus 2 by RT PCR NEGATIVE NEGATIVE Final    Comment: (NOTE) SARS-CoV-2 target nucleic acids are NOT DETECTED.  The SARS-CoV-2 RNA is generally detectable in upper respiratory specimens during the acute phase of infection. The lowest concentration of SARS-CoV-2 viral copies this assay can detect is 138 copies/mL. A negative result does not preclude SARS-Cov-2 infection and should not be used as the sole basis for treatment or other patient management decisions. A negative result may occur with  improper specimen  collection/handling, submission of specimen other than nasopharyngeal swab, presence of viral mutation(s) within the areas targeted by this assay, and inadequate number of viral copies(<138 copies/mL). A negative result must be combined with clinical observations, patient history, and epidemiological information. The expected result is Negative.  Fact Sheet for Patients:  BloggerCourse.com  Fact Sheet for Healthcare Providers:  SeriousBroker.it  This test is no t yet approved or cleared by the [place]  FDA and  has been authorized for detection and/or diagnosis of SARS-CoV-2 by FDA under an Emergency Use Authorization (EUA). This EUA  will remain  in effect (meaning this test can be used) for the duration of the COVID-19 declaration under Section 564(b)(1) of the Act, 21 U.S.C.section 360bbb-3(b)(1), unless the authorization is terminated  or revoked sooner.       Influenza A by PCR NEGATIVE NEGATIVE Final   Influenza B by PCR NEGATIVE NEGATIVE Final    Comment: (NOTE) The Xpert Xpress SARS-CoV-2/FLU/RSV plus assay is intended as an aid in the diagnosis of influenza from Nasopharyngeal swab specimens and should not be used as a sole basis for treatment. Nasal washings and aspirates are unacceptable for Xpert Xpress SARS-CoV-2/FLU/RSV testing.  Fact Sheet for Patients: BloggerCourse.com  Fact Sheet for Healthcare Providers: SeriousBroker.it  This test is not yet approved or cleared by the [place]  FDA and has been authorized for detection and/or diagnosis of SARS-CoV-2 by FDA under an Emergency Use Authorization (EUA). This EUA will remain in effect (meaning this test can be used) for the duration of the COVID-19 declaration under Section 564(b)(1) of the Act, 21 U.S.C. section 360bbb-3(b)(1), unless the authorization is terminated or revoked.     Resp Syncytial Virus by PCR NEGATIVE NEGATIVE Final    Comment: (NOTE) Fact Sheet for Patients: BloggerCourse.com  Fact Sheet for Healthcare Providers: SeriousBroker.it  This test is not yet approved or cleared by the [place]  FDA and has been authorized for detection and/or diagnosis of SARS-CoV-2 by FDA under an Emergency Use Authorization (EUA). This EUA will remain in effect (meaning this test can be used) for the duration of the COVID-19 declaration under Section 564(b)(1) of the Act, 21 U.S.C. section 360bbb-3(b)(1), unless the authorization is terminated or revoked.  Performed at [facility], [address] [place], [place]  [address]   Culture, blood (routine x 2)     Status: None (Preliminary result)   Collection Time: 02/10/24  1:59 AM   Specimen: BLOOD LEFT ARM  Result Value Ref Range Status   Specimen Description BLOOD LEFT ARM  Final   Special Requests   Final    BOTTLES DRAWN AEROBIC ONLY Blood Culture results may not be optimal due to an inadequate volume of blood received in culture bottles   Culture   Final    NO GROWTH 3 DAYS Performed at [facility], [address], [place], [place] [address]    Report Status PENDING  Incomplete  Culture, blood (routine x 2)     Status: None (Preliminary result)   Collection Time: 02/10/24  1:59 AM   Specimen: BLOOD LEFT HAND  Result Value Ref Range Status   Specimen Description BLOOD LEFT HAND  Final   Special Requests   Final    BOTTLES DRAWN AEROBIC ONLY Blood Culture results may not be optimal due to an inadequate volume of blood received in culture bottles   Culture   Final    NO GROWTH 3 DAYS Performed at [facility], [address]  [place], [place] [address]    Report Status PENDING  Incomplete  Expectorated Sputum Assessment w Gram Stain, Rflx to Resp Cult     Status: None   Collection Time: 02/11/24  4:44 AM   Specimen: Expectorated Sputum  Result Value Ref Range Status   Specimen Description EXPECTORATED SPUTUM  Final   Special Requests NONE  Final   Sputum evaluation   Final    Sputum specimen not acceptable for testing.  Please recollect.   JANESE MAROLYN HOE RN [phone] 02/11/24 HNM Performed at [facility], [address] [place], [place] [address]    Report Status 02/11/2024 FINAL  Final    Coagulation Studies: No results for input(s): LABPROT, INR in the last 72 hours.  Urinalysis: No results for input(s): COLORURINE, LABSPEC, PHURINE, GLUCOSEU, HGBUR, BILIRUBINUR, KETONESUR, PROTEINUR, UROBILINOGEN, NITRITE, LEUKOCYTESUR in the last 72 hours.  Invalid input(s): APPERANCEUR      Imaging: No results found.    Medications:    cefTRIAXone  (ROCEPHIN )  IV 2 g (02/13/24 1153)   promethazine  (PHENERGAN ) injection (IM or IVPB) 12.5 mg (02/12/24 1559)    acidophilus  2 capsule Oral Daily   aspirin  EC  81 mg Oral Daily   dapagliflozin  propanediol  10 mg Oral Daily   dextromethorphan -guaiFENesin   1 tablet Oral BID   enoxaparin  (LOVENOX ) injection  30 mg Subcutaneous Q24H   hydrALAZINE   25 mg Oral Q8H   insulin  aspart  0-15 Units Subcutaneous Q4H   insulin  glargine  8 Units Subcutaneous QHS   metoprolol  succinate  50 mg Oral q morning   pantoprazole  (PROTONIX ) IV  40 mg Intravenous QHS   rosuvastatin   20 mg Oral Daily   acetaminophen  **OR** acetaminophen , guaiFENesin , loperamide , morphine  injection, oxyCODONE , promethazine  (PHENERGAN ) injection (IM or IVPB), sodium chloride , trimethobenzamide   Assessment/ Plan:  Ms. Joy Tapia is a 83 y.o.  female with medical problems of diabetes, hypertension, dementia, CKD was admitted on 02/09/2024 for Dehydration [E86.0] Starvation ketoacidosis [T73.0XXA, E87.29] Thyroid  nodule [E04.1] SOB (shortness of breath) [R06.02] Pulmonary nodules [R91.8] ACS (acute coronary syndrome) (HCC) [I24.9] Burning chest pain [R07.89] NSTEMI (non-ST elevated myocardial infarction) (HCC) [I21.4] Intractable nausea and vomiting [R11.2] Nausea and vomiting, unspecified vomiting type [R11.2]   AKI on Chronic kidney disease stage IIIb  - AKI likely secondary to IV contrast exposure. - Creatinine has improved and is back to baseline. - Maintain oral intake.  - no acute indication for dialysis  - Avoid further IV contrast exposure, nonsteroidals. - Supportive care.  Lab Results  Component Value Date   CREATININE 1.65 (H) 02/13/2024   CREATININE 2.00 (H) 02/12/2024   CREATININE 1.96 (H) 02/11/2024    Intake/Output Summary (Last 24 hours) at 02/13/2024 1416 Last data filed at 02/13/2024 1100 Gross per 24 hour  Intake 735.09  ml  Output --  Net 735.09 ml    2.  Lower extremity edema, acute systolic and diastolic CHF -Based on most recent echo, patient has LVEF 20 to 25%, severely decreased LV function, moderate to severe mitral regurgitation and moderate tricuspid regurgitation.,  Grade 2 diastolic dysfunction - Continue medical management per cardiology team - Volume status stable     LOS: 3 Joy Tapia 9/19/20252:16 PM

## 2024-02-13 NOTE — Plan of Care (Signed)
   Problem: Education: Goal: Ability to describe self-care measures that may prevent or decrease complications (Diabetes Survival Skills Education) will improve Outcome: Progressing Goal: Individualized Educational Video(s) Outcome: Progressing   Problem: Coping: Goal: Ability to adjust to condition or change in health will improve Outcome: Progressing

## 2024-02-13 NOTE — Discharge Summary (Addendum)
 Physician Discharge Summary   Patient: Joy Tapia MRN: [id number] DOB: [birth year]  Admit date:     02/09/2024  Discharge date: 02/13/24  Discharge Physician: Burnard DELENA Cunning   PCP: [facility]   Recommendations at discharge:   Follow up with [facility].  Their office to schedule appointment. Follow up with Primary Care in 1 week Repeat CBC, CMP, Mg in 1 week Follow up on glycemic control & adjust insulin  regimen as needed.  Pt was requiring less insulin  during admission, expect will need it increased again once on her usual diet at home Follow up on recovery from pneumonia after completing antibiotics (4 more days after d/c) Thyroid  U/S recommended as outpatient for incidental finding  of left thyroid  nodule   Discharge Diagnoses: Active Problems:   Dementia without behavioral disturbance (HCC)   Type 2 diabetes mellitus with hyperglycemia, with long-term current use of insulin  (HCC)   AKI (acute kidney injury) (HCC)   Hypertension associated with diabetes (HCC)   Takotsubo syndrome   Acute HFrEF (heart failure with reduced ejection fraction) (HCC)   CAP (community acquired pneumonia)   CKD stage 3b, GFR 30-44 ml/min (HCC)  Principal Problem (Resolved):   Intractable nausea and vomiting Resolved Problems:   ACS (acute coronary syndrome) (HCC)   NSTEMI (non-ST elevated myocardial infarction) (HCC)   Lactic acid acidosis  Hospital Course:  83 y.o. female with medical history significant of  T2DM, HTN, CKDIIIb, dementia sent from assisted living facility to ED for evaluation of nausea and vomiting x 1 day.  She denies abdominal pain, fevers, SOB.  She has chronic diarrhea which she reports is at baseline.  Since she has been vomiting, she has developed non radiating substernal chest pain and worsening of her chronic low back pain.  ... In the ED, she was hypertensive up to 226/89, afebrile  saturating appropriately on room air.  Slight leukocytosis of  13.7, lactic acid 3.3.  Initial troponin 77>98, EKG nonischemic and stable compared to prior.  COVID flu and RSV negative...   See H&P for full HPI on admission & ED course.     Further history obtained from friends POA Donny and Niyonna who reported that as per Fredick, she was throwing up for 2 days and chest pain last night as ALF and had chest pain and other time indigestion. Has been on PPI and has been off mid August and on Pepcid till Aug 28. They are all friends from [place] and was pt was reportedly living in bad situation and they moved her to local ALF in August.  They don't know her medical history She does have dementia moderate to severe per POA based on her assessment.     Patient was admitted for further evaluation and management as outlined in detail below.   In summary, pt's troponin continued to trend upwards above 2k, with repeat EKG 9/16 AM concerning for ischemic changes.   Cardiology was urgently consulted.  Pt was given aspirin  325 mg and started on IV heparin .  On Echo, findings consistent with Takotsubo cardiomyopathy with newly reduced EF of 20-25% with apical ballooning and hyperkinetic base.  Assessment and Plan:  NSTEMI  New onset HFrEF Takotsubo cardiomyopathy Persistent nausea -- now resolved --Cardiology consulted -- they will schedule outpatient follow up --Treated with IV heparin  drip  --ASA 81 mg, Crestor  20 mg daily --metoprolol  reduced to 25 mg daily --losartan  on hold-- can be resumed at follow up if renal function & BP tolerates --Lasix   d/c'd with rising Cr, last IV dose 9/17.  No Lasix  on d/c per cardiology. --Monitor daily weights for volume status --Further GDMT as BP allows - in follow up --R/L heart cath deferred for medical management after discussions were had with her POA, given baseline dementia, CKD, declining baseline quality of life --Palliative care consulted for GOC - pt currently remains full code / full scope of care        Leukocytosis: ?Pneumonia, bibasilar UA is unremarkable for UTI COVID RSV influenza has been negative. Suspected reactive at this time in setting of acute CHF and NSTEMI, however CT abdomen showed patchy ground glass and confluent airspace disease in both lower lungs with bronchial wall thickening. --Follow blood cultures --Treated with empiric ceftriaxone  doxycycline  for CAP coverage  --Discharge on 4 more days PO Augmentin  --Mucinex  BID, PRN Robitussin --Sputum culture sent was not acceptable for testing --PCP follow up on resolution in 1-2 weeks     Hypomagnesemia - Mg 1.6 was replaced on 9/17 --Maintain Mg>2   HTN urgency - on admission, BP's were later soft/borderline.  BP's currently controlled to mildly elevated at times --Medications as above  Tachycardia: resolved --Monitor HR's    Nausea and vomiting - resolved Diarrhea - chronic intermittent Ketonuria: Abdominal exam benign,CT abdomen was pending> subsequently resulted without acute finding on abdomen LFTs lipase normal --Continue supportive care --Probiotic while on antibiotics     Agitation - resolved once acute problems above improved Dementia at baseline: CT head negative, abdominal exam benign. COVID/flu/RSV negative.   No obvious infection although has leukocytosis lactic acidosis She is alert awake oriented x 3.  --Delirium precautions     Lactic acidosis  - resolved likely from new onset CHF Concern for multifocal pneumonia based on CT abdomen vs fluid overload: Unclear etiology could be in the setting of #1-w/ MI vs from intractable nausea vomiting and hyperglycemia     Type 2 Diabetes with uncontrolled hyperglycemia: CBG's into 400's earlier in admission, now controlled PTA on glipizide, Semglee  22 units bedtime and 8 units Premeal Novolog .   A1c 9% --Discharge on Lantus  12 units at bedtime --Otherwise resume home regimen at d/c --PCP close follow up to adjust regimen   AKI superimposed on  CKD3b: Baseline creatinine has been in the range of 1.2-1.5. Today Cr up to 1.96 >> 2.00 >> 1.65 today Aki due to IV contrast exposure and NSAID use prior to admission --Hold nephrotoxins, NSAID's, further IV contrast --Nephrology was consulted --Holding Lasix  and losartan  - resume in follow up when appropriate   HLD: Cont  pravastatin   GERD: Cont omeprazole POA reports no longer taking Pepcid   Chronic opiate use Chronic back pain: POA reports patient no longer on oxycodone  & does not want this continued.  Requested Tramadol  instead to minimize sedation and fall.s --Tylenol  PRN or Tramadol  PRN   Incidental left thyroid  nodule 1.9 cm: Advised for nonemergent thyroid  ultrasound   Scattered parenchymal nodules throughout the lungs: Largest 3 mm no follow-up needed for low risk       Consultants: Cardiology, Nephrology, Palliative Care Procedures performed: echo   Disposition: Assisted living  Diet recommendation:  Cardiac and Carb modified diet  DISCHARGE MEDICATION: Allergies as of 02/13/2024       Reactions   Atorvastatin Diarrhea   Allergy not listed on MAR    Azithromycin Other (See Comments)   Pt states burns her insides. Allergy not listed on MAR    Erythromycin Other (See Comments)   Unknown. Allergy not  listed on MAR    Other Other (See Comments)   Adhesive bandage, Anesthesia Extension set  -  unknown. Allergy not listed on MAR    Propoxyphene Other (See Comments)   Allergy not listed on MAR         Medication List     STOP taking these medications    cloNIDine  0.2 MG tablet Commonly known as: CATAPRES    famotidine 20 MG tablet Commonly known as: PEPCID   furosemide  40 MG tablet Commonly known as: LASIX    losartan  50 MG tablet Commonly known as: COZAAR    oxyCODONE  5 MG immediate release tablet Commonly known as: Oxy IR/ROXICODONE    oxymetazoline 0.05 % nasal spray Commonly known as: AFRIN   pravastatin  20 MG tablet Commonly known  as: PRAVACHOL        TAKE these medications    acetaminophen  325 MG tablet Commonly known as: TYLENOL  Take 650 mg by mouth every 8 (eight) hours as needed for mild pain (pain score 1-3) or moderate pain (pain score 4-6).   acidophilus Caps capsule Take 2 capsules by mouth daily for 10 days. Start taking on: February 14, 2024   amLODipine  5 MG tablet Commonly known as: NORVASC  Take 1 tablet (5 mg total) by mouth daily.   amoxicillin -clavulanate 875-125 MG tablet Commonly known as: AUGMENTIN  Take 1 tablet by mouth 2 (two) times daily for 4 days.   aspirin  EC 81 MG tablet Take 81 mg by mouth every morning.   cetirizine 10 MG tablet Commonly known as: ZYRTEC Take 10 mg by mouth every morning.   dapagliflozin  propanediol 10 MG Tabs tablet Commonly known as: FARXIGA  Take 1 tablet (10 mg total) by mouth daily. Start taking on: February 14, 2024   dextromethorphan -guaiFENesin  30-600 MG 12hr tablet Commonly known as: MUCINEX  DM Take 1 tablet by mouth 2 (two) times daily for 10 days. Then BID PRN for cough   Febuxostat  80 MG Tabs Take 80 mg by mouth every morning.   fluticasone  50 MCG/ACT nasal spray Commonly known as: FLONASE  Place 1 spray into both nostrils daily as needed for allergies or rhinitis.   glipiZIDE 5 MG tablet Commonly known as: GLUCOTROL Take 5 mg by mouth daily.   guaiFENesin  100 MG/5ML liquid Commonly known as: ROBITUSSIN Take 5 mLs by mouth every 4 (four) hours as needed for cough or to loosen phlegm.   hydrALAZINE  25 MG tablet Commonly known as: APRESOLINE  Take 1 tablet (25 mg total) by mouth every 8 (eight) hours.   insulin  aspart 100 UNIT/ML injection Commonly known as: novoLOG  Inject 8 Units into the skin 3 (three) times daily with meals.   insulin  glargine-yfgn 100 UNIT/ML injection Commonly known as: SEMGLEE  Inject 0.12 mLs (12 Units total) into the skin at bedtime. What changed: how much to take   INSULIN  SYRINGE .5CC/28G 28G X 1/2  0.5 ML Misc 1 Application by Does not apply route daily.   liver oil-zinc  oxide 40 % ointment Commonly known as: DESITIN Apply topically every 6 (six) hours as needed for irritation.   metoprolol  succinate 50 MG 24 hr tablet Commonly known as: TOPROL -XL Take 50 mg by mouth every morning.   multivitamin capsule Take 1 capsule by mouth every morning.   omeprazole 20 MG capsule Commonly known as: PRILOSEC Take 20 mg by mouth every morning.   rosuvastatin  20 MG tablet Commonly known as: CRESTOR  Take 1 tablet (20 mg total) by mouth daily. Start taking on: February 14, 2024   sodium chloride  0.65 % Soln  nasal spray Commonly known as: OCEAN Place 2 sprays into both nostrils as needed for congestion.   traMADol  50 MG tablet Commonly known as: Ultram  Take 1 tablet (50 mg total) by mouth every 6 (six) hours as needed for severe pain (pain score 7-10) or moderate pain (pain score 4-6).   vitamin B-12 500 MCG tablet Commonly known as: CYANOCOBALAMIN  Take 500 mcg by mouth every morning.   Vitamin D (Ergocalciferol) 1.25 MG (50000 UNIT) Caps capsule Commonly known as: DRISDOL Take 50,000 Units by mouth once a week.        Follow-up Information     Custovic, Annalee, DO. Go in 2 week(s).   Specialty: Cardiology Why: Appointment scheduled for 10/1 at 1:45 PM Contact information: [address] [place] [address] [phone]         [facility]. Go in 1 week(s).   Why: [facility] on 9/23 at 2 PM Contact information: [address] [place], [place] [address] [phone]        [facility] Follow up.   Specialty: Home Health Services Why: They will resume home health services at discharge. Contact information: [address] [place] [address] [phone]                Discharge Exam: Fredricka Weights   02/09/24 1814 02/10/24 2129  Weight: 59 kg 58.5 kg    General exam: awake, alert, no acute distress HEENT: atraumatic, clear conjunctiva, anicteric sclera, moist mucus membranes, hearing grossly normal  Respiratory system: CTAB, no wheezes, rales or rhonchi, normal respiratory effort. Cardiovascular system: normal S1/S2, RRR, trace pedal edema.   Gastrointestinal system: soft, NT, ND, no HSM felt, +bowel sounds. Central nervous system: A&O x 2+. no gross focal neurologic deficits, normal speech Skin: dry, intact, normal temperature Psychiatry: normal mood, congruent affect, judgement and insight appear normal   Condition at discharge: stable  The results of significant diagnostics from this hospitalization (including imaging, microbiology, ancillary and laboratory) are listed below for reference.   Imaging Studies: ECHOCARDIOGRAM COMPLETE Result Date: 02/10/2024    ECHOCARDIOGRAM REPORT   Patient Name:   VASTIE DOUTY Date of Exam: 02/10/2024 Medical Rec #:  [id number]           Height:       59.0 in Accession #:    [id number]          Weight:       130.0 lb Date of Birth:  [birth year]           BSA:          1.536 m Patient Age:    83 years            BP:           177/92 mmHg Patient Gender: F                   HR:           96 bpm. Exam Location:  [facility] Procedure: 2D Echo, Cardiac Doppler and Color Doppler (Both Spectral and Color            Flow Doppler were utilized during procedure). Indications:     Chest Pain R07.9  History:         Patient has no prior history of Echocardiogram examinations.                  Signs/Symptoms:Chest Pain.  Sonographer:     Ashley McNeely-Sloane Referring Phys:  [id number] DAVED BROCKS [facility] Diagnosing Phys: Sabina Custovic IMPRESSIONS  1. Apical ballooning with hyperkinetic bases consistent with Takotsubo cardiomyopathy. Left ventricular ejection fraction, by estimation, is 20 to 25%. The left ventricle has severely decreased function. The left ventricle demonstrates regional wall motion abnormalities (see scoring  diagram/findings for description). Left ventricular diastolic parameters are consistent with Grade II diastolic dysfunction (pseudonormalization).  2. Right ventricular systolic function is normal. The right ventricular size is normal.  3. Left atrial size was mildly dilated.  4. The mitral valve is normal in structure. Moderate to severe mitral valve regurgitation. No evidence of mitral stenosis.  5. Tricuspid valve regurgitation is moderate.  6. The aortic valve is normal in structure. Aortic valve regurgitation is not visualized. No aortic stenosis is present.  7. The inferior vena cava is normal in size with greater than 50% respiratory variability, suggesting right atrial pressure of 3 mmHg. FINDINGS  Left Ventricle: Apical ballooning with hyperkinetic bases consistent with Takotsubo cardiomyopathy. Left ventricular ejection fraction, by estimation, is 20 to 25%. The left ventricle has severely decreased function. The left ventricle demonstrates regional wall motion abnormalities. The left ventricular internal cavity size was normal in size. There is no left ventricular hypertrophy. Left ventricular diastolic parameters are consistent with Grade II diastolic dysfunction (pseudonormalization).  LV Wall Scoring: The apical anterior segment, apical inferior segment, and apex are hypokinetic. Right Ventricle: The right ventricular size is normal. No increase in right ventricular wall thickness. Right ventricular systolic function is normal. Left Atrium: Left atrial size was mildly dilated. Right Atrium: Right atrial size was normal in size. Pericardium: There is no evidence of pericardial effusion. Mitral Valve: The mitral valve is normal in structure. Moderate to severe mitral valve regurgitation. No evidence of mitral valve stenosis. MV peak gradient, 8.8 mmHg. The mean mitral valve gradient is 4.0 mmHg. Tricuspid Valve: The tricuspid valve is normal in structure. Tricuspid valve regurgitation is moderate. Aortic  Valve: The aortic valve is normal in structure. Aortic valve regurgitation is not visualized. No aortic stenosis is present. Aortic valve mean gradient measures 4.0 mmHg. Aortic valve peak gradient measures 7.4 mmHg. Aortic valve area, by VTI measures 1.06 cm. Pulmonic Valve: The pulmonic valve was normal in structure. Pulmonic valve regurgitation is not visualized. Aorta: The aortic root is normal in size and structure. Venous: The inferior vena cava is normal in size with greater than 50% respiratory variability, suggesting right atrial pressure of 3 mmHg. IAS/Shunts: No atrial level shunt detected by color flow Doppler.  LEFT VENTRICLE PLAX 2D LVIDd:         4.50 cm     Diastology LVIDs:         3.00 cm     LV e' medial:    5.75 cm/s LV PW:         0.90 cm     LV E/e' medial:  19.8 LV IVS:        0.90 cm     LV e' lateral:   8.70 cm/s LVOT diam:     1.70 cm     LV E/e' lateral: 13.1 LV SV:         23 LV SV Index:   15 LVOT Area:     2.27 cm  LV Volumes (MOD) LV vol d, MOD A4C: 56.7 ml LV vol s, MOD A4C: 48.4 ml LV SV MOD A4C:     56.7 ml RIGHT VENTRICLE  IVC RV Basal diam:  2.80 cm     IVC diam: 1.90 cm RV Mid diam:    2.10 cm RV S prime:     12.00 cm/s TAPSE (M-mode): 1.6 cm LEFT ATRIUM             Index        RIGHT ATRIUM          Index LA diam:        3.30 cm 2.15 cm/m   RA Area:     9.77 cm LA Vol (A2C):   36.3 ml 23.64 ml/m  RA Volume:   18.20 ml 11.85 ml/m LA Vol (A4C):   35.1 ml 22.85 ml/m LA Biplane Vol: 36.2 ml 23.57 ml/m  AORTIC VALVE                    PULMONIC VALVE AV Area (Vmax):    1.14 cm     PV Vmax:        0.94 m/s AV Area (Vmean):   1.11 cm     PV Vmean:       66.000 cm/s AV Area (VTI):     1.06 cm     PV VTI:         0.147 m AV Vmax:           136.00 cm/s  PV Peak grad:   3.6 mmHg AV Vmean:          93.400 cm/s  PV Mean grad:   2.0 mmHg AV VTI:            0.219 m      RVOT Peak grad: 2 mmHg AV Peak Grad:      7.4 mmHg AV Mean Grad:      4.0 mmHg LVOT Vmax:         68.10  cm/s LVOT Vmean:        45.600 cm/s LVOT VTI:          0.102 m LVOT/AV VTI ratio: 0.47  AORTA Ao Root diam: 2.90 cm Ao Asc diam:  2.80 cm MITRAL VALVE                TRICUSPID VALVE MV Area (PHT): 5.97 cm     TR Peak grad:   55.4 mmHg MV Area VTI:   1.20 cm     TR Mean grad:   41.0 mmHg MV Peak grad:  8.8 mmHg     TR Vmax:        372.00 cm/s MV Mean grad:  4.0 mmHg     TR Vmean:       310.0 cm/s MV Vmax:       1.48 m/s MV Vmean:      92.0 cm/s    SHUNTS MV Decel Time: 127 msec     Systemic VTI:  0.10 m MR Peak grad: 102.4 mmHg    Systemic Diam: 1.70 cm MR Mean grad: 79.0 mmHg     Pulmonic VTI:  0.101 m MR Vmax:      506.00 cm/s MR Vmean:     435.0 cm/s MV E velocity: 114.00 cm/s MV A velocity: 148.00 cm/s MV E/A ratio:  0.77 Sabina Custovic Electronically signed by Annalee Casa Signature Date/Time: 02/10/2024/12:44:19 PM    Final    CT ABDOMEN PELVIS WO CONTRAST Result Date: 02/10/2024 CLINICAL DATA:  Abdominal pain and vomiting. EXAM: CT ABDOMEN AND PELVIS WITHOUT CONTRAST TECHNIQUE: Multidetector CT imaging of the abdomen and pelvis  was performed following the standard protocol without IV contrast. RADIATION DOSE REDUCTION: This exam was performed according to the departmental dose-optimization program which includes automated exposure control, adjustment of the mA and/or kV according to patient size and/or use of iterative reconstruction technique. COMPARISON:  08/06/2023 FINDINGS: Lower chest: Interval development of patchy ground-glass and confluent airspace disease in both lower lungs with associated bronchial wall thickening and septal prominence. Nodular components measure up to 6 mm. Small bilateral pleural effusions. Hepatobiliary: No suspicious focal abnormality within the liver parenchyma. Nonvisualization of the gallbladder compatible with the reported history of cholecystectomy. No intrahepatic or extrahepatic biliary dilation. Pancreas: No focal mass lesion. No dilatation of the main duct. No  intraparenchymal cyst. No peripancreatic edema. Spleen: No splenomegaly. No suspicious focal mass lesion. Adrenals/Urinary Tract: No adrenal nodule or mass. Tiny well-defined homogeneous low-density lesions in both kidneys are too small to characterize but are statistically most likely benign and probably cysts. No followup imaging is recommended. 10 mm hypoattenuating lesion lower pole left kidney is stable since prior and approaches water density, likely a cyst. Excreted contrast material in the renal calices, ureters, and bladder is compatible with CT imaging yesterday. Stomach/Bowel: Stomach is unremarkable. No gastric wall thickening. No evidence of outlet obstruction. Duodenum is normally positioned as is the ligament of Treitz. Duodenal diverticulum noted. No small bowel wall thickening. No small bowel dilatation. The terminal ileum is normal. The appendix is normal. No gross colonic mass. No colonic wall thickening. Vascular/Lymphatic: There is mild atherosclerotic calcification of the abdominal aorta without aneurysm. There is no gastrohepatic or hepatoduodenal ligament lymphadenopathy. No retroperitoneal or mesenteric lymphadenopathy. No pelvic sidewall lymphadenopathy. Reproductive: There is no adnexal mass. Other: No intraperitoneal free fluid. Musculoskeletal: No worrisome lytic or sclerotic osseous abnormality. Advanced degenerative disc disease noted in the lumbar spine. IMPRESSION: 1. No acute findings in the abdomen or pelvis. Specifically, no findings to explain the patient's history of abdominal pain and vomiting. 2. Interval development of patchy ground-glass and confluent airspace disease in both lower lungs with associated bronchial wall thickening and septal prominence. Nodular components measure up to 6 mm. Imaging features are compatible with infectious/inflammatory etiology. Multifocal pneumonia a distinct consideration. Follow-up CT chest without contrast in 3 months recommended to ensure  resolution. 3. Small bilateral pleural effusions. 4.  Aortic Atherosclerosis (ICD10-I70.0). Electronically Signed   By: Camellia Candle M.D.   On: 02/10/2024 11:51   US  Venous Img Lower Bilateral (DVT) Result Date: 02/09/2024 CLINICAL DATA:  Bilateral lower extremity pain and swelling EXAM: BILATERAL LOWER EXTREMITY VENOUS DOPPLER ULTRASOUND TECHNIQUE: Gray-scale sonography with graded compression, as well as color Doppler and duplex ultrasound were performed to evaluate the lower extremity deep venous systems from the level of the common femoral vein and including the common femoral, femoral, profunda femoral, popliteal and calf veins including the posterior tibial, peroneal and gastrocnemius veins when visible. The superficial great saphenous vein was also interrogated. Spectral Doppler was utilized to evaluate flow at rest and with distal augmentation maneuvers in the common femoral, femoral and popliteal veins. COMPARISON:  None Available. FINDINGS: RIGHT LOWER EXTREMITY Common Femoral Vein: No evidence of thrombus. Normal compressibility, respiratory phasicity and response to augmentation. Saphenofemoral Junction: No evidence of thrombus. Normal compressibility and flow on color Doppler imaging. Profunda Femoral Vein: No evidence of thrombus. Normal compressibility and flow on color Doppler imaging. Femoral Vein: No evidence of thrombus. Normal compressibility, respiratory phasicity and response to augmentation. Popliteal Vein: No evidence of thrombus. Normal compressibility, respiratory phasicity and  response to augmentation. Calf Veins: No evidence of thrombus. Normal compressibility and flow on color Doppler imaging. Superficial Great Saphenous Vein: No evidence of thrombus. Normal compressibility and flow on color Doppler imaging. Venous Reflux:  None. Other Findings:  None. LEFT LOWER EXTREMITY Common Femoral Vein: No evidence of thrombus. Normal compressibility, respiratory phasicity and response to  augmentation. Saphenofemoral Junction: No evidence of thrombus. Normal compressibility and flow on color Doppler imaging. Profunda Femoral Vein: No evidence of thrombus. Normal compressibility and flow on color Doppler imaging. Femoral Vein: No evidence of thrombus. Normal compressibility, respiratory phasicity and response to augmentation. Popliteal Vein: No evidence of thrombus. Normal compressibility, respiratory phasicity and response to augmentation. Calf Veins: No evidence of thrombus. Normal compressibility and flow on color Doppler imaging. Superficial Great Saphenous Vein: No evidence of thrombus. Normal compressibility and flow on color Doppler imaging. Venous Reflux:  None. Other Findings:  None. IMPRESSION: No evidence of deep venous thrombosis. Electronically Signed   By: Oneil Devonshire M.D.   On: 02/09/2024 23:39   CT Head Wo Contrast Result Date: 02/09/2024 CLINICAL DATA:  Nausea and vomiting EXAM: CT HEAD WITHOUT CONTRAST TECHNIQUE: Contiguous axial images were obtained from the base of the skull through the vertex without intravenous contrast. RADIATION DOSE REDUCTION: This exam was performed according to the departmental dose-optimization program which includes automated exposure control, adjustment of the mA and/or kV according to patient size and/or use of iterative reconstruction technique. COMPARISON:  None Available. FINDINGS: Brain: No evidence of acute infarction, hemorrhage, hydrocephalus, extra-axial collection or mass lesion/mass effect. Mild atrophic changes are noted commensurate with the patient's age. Vascular: No hyperdense vessel or unexpected calcification. Skull: Normal. Negative for fracture or focal lesion. Sinuses/Orbits: No acute finding. Other: None. IMPRESSION: Mild atrophic changes without acute abnormality. Electronically Signed   By: Oneil Devonshire M.D.   On: 02/09/2024 22:46   CT Angio Chest PE W/Cm &/Or Wo Cm Result Date: 02/09/2024 CLINICAL DATA:  Nausea and vomiting  with shortness of breath, initial encounter EXAM: CT ANGIOGRAPHY CHEST WITH CONTRAST TECHNIQUE: Multidetector CT imaging of the chest was performed using the standard protocol during bolus administration of intravenous contrast. Multiplanar CT image reconstructions and MIPs were obtained to evaluate the vascular anatomy. RADIATION DOSE REDUCTION: This exam was performed according to the departmental dose-optimization program which includes automated exposure control, adjustment of the mA and/or kV according to patient size and/or use of iterative reconstruction technique. CONTRAST:  60mL OMNIPAQUE  IOHEXOL  350 MG/ML SOLN COMPARISON:  Chest x-ray from earlier in the same day. FINDINGS: Cardiovascular: Atherosclerotic calcifications of the thoracic aorta are noted. No aneurysmal dilatation or dissection is seen. The heart is mildly enlarged in size. The pulmonary artery shows a normal branching pattern bilaterally. No filling defect is identified to suggest pulmonary embolism. Mild coronary calcifications are seen. No pericardial effusion is noted. Mediastinum/Nodes: Thoracic inlet shows evidence of a left inferior thyroid  nodule measuring 19 mm with mixed attenuation and heavy calcifications. This extends into the superior mediastinum. No hilar or mediastinal adenopathy is noted. The esophagus as visualized is within normal limits. Lungs/Pleura: Lungs are well aerated bilaterally. No focal infiltrate or sizable effusion is seen. Scattered parenchymal nodules are noted bilaterally. The largest of these lies within the right middle lobe best seen on image number 63 of series 5 measuring 3 mm in dimension. Upper Abdomen: Visualized upper abdomen shows no acute abnormality. Musculoskeletal: Degenerative changes of the thoracic spine are noted. Review of the MIP images confirms the above findings. IMPRESSION:  No evidence of pulmonary emboli. Incidental left thyroid  nodule measuring 1.9 cm. Recommend non-emergent thyroid   ultrasound. Reference: J Am Coll Radiol. 2015 Feb;12(2): 143-50 Scattered parenchymal nodules are noted throughout both lungs. The largest of these measures 3 mm. No follow-up needed if patient is low-risk (and has no known or suspected primary neoplasm). Non-contrast chest CT can be considered in 12 months if patient is high-risk. This recommendation follows the consensus statement: Guidelines for Management of Incidental Pulmonary Nodules Detected on CT Images: From the Fleischner Society 2017; Radiology 2017; 284:228-243. Aortic Atherosclerosis (ICD10-I70.0). Electronically Signed   By: Oneil Devonshire M.D.   On: 02/09/2024 22:45   DG Chest Port 1 View Result Date: 02/09/2024 CLINICAL DATA:  Shortness of breath EXAM: PORTABLE CHEST 1 VIEW COMPARISON:  08/04/2023 FINDINGS: Cardiac shadow is within normal limits. Lungs are well aerated bilaterally. Elevation of the right hemidiaphragm is again seen. No acute infiltrate is noted. No bony abnormality is seen. IMPRESSION: No acute abnormality noted. Electronically Signed   By: Oneil Devonshire M.D.   On: 02/09/2024 19:47    Microbiology: Results for orders placed or performed during the hospital encounter of 02/09/24  Resp panel by RT-PCR (RSV, Flu A&B, Covid) Anterior Nasal Swab     Status: None   Collection Time: 02/09/24  7:16 PM   Specimen: Anterior Nasal Swab  Result Value Ref Range Status   SARS Coronavirus 2 by RT PCR NEGATIVE NEGATIVE Final    Comment: (NOTE) SARS-CoV-2 target nucleic acids are NOT DETECTED.  The SARS-CoV-2 RNA is generally detectable in upper respiratory specimens during the acute phase of infection. The lowest concentration of SARS-CoV-2 viral copies this assay can detect is 138 copies/mL. A negative result does not preclude SARS-Cov-2 infection and should not be used as the sole basis for treatment or other patient management decisions. A negative result may occur with  improper specimen collection/handling, submission of  specimen other than nasopharyngeal swab, presence of viral mutation(s) within the areas targeted by this assay, and inadequate number of viral copies(<138 copies/mL). A negative result must be combined with clinical observations, patient history, and epidemiological information. The expected result is Negative.  Fact Sheet for Patients:  BloggerCourse.com  Fact Sheet for Healthcare Providers:  SeriousBroker.it  This test is no t yet approved or cleared by the [place]  FDA and  has been authorized for detection and/or diagnosis of SARS-CoV-2 by FDA under an Emergency Use Authorization (EUA). This EUA will remain  in effect (meaning this test can be used) for the duration of the COVID-19 declaration under Section 564(b)(1) of the Act, 21 U.S.C.section 360bbb-3(b)(1), unless the authorization is terminated  or revoked sooner.       Influenza A by PCR NEGATIVE NEGATIVE Final   Influenza B by PCR NEGATIVE NEGATIVE Final    Comment: (NOTE) The Xpert Xpress SARS-CoV-2/FLU/RSV plus assay is intended as an aid in the diagnosis of influenza from Nasopharyngeal swab specimens and should not be used as a sole basis for treatment. Nasal washings and aspirates are unacceptable for Xpert Xpress SARS-CoV-2/FLU/RSV testing.  Fact Sheet for Patients: BloggerCourse.com  Fact Sheet for Healthcare Providers: SeriousBroker.it  This test is not yet approved or cleared by the [place]  FDA and has been authorized for detection and/or diagnosis of SARS-CoV-2 by FDA under an Emergency Use Authorization (EUA). This EUA will remain in effect (meaning this test can be used) for the duration of the COVID-19 declaration under Section 564(b)(1) of the Act, 21 U.S.C. section 360bbb-3(b)(1),  unless the authorization is terminated or revoked.     Resp Syncytial Virus by PCR NEGATIVE NEGATIVE Final     Comment: (NOTE) Fact Sheet for Patients: BloggerCourse.com  Fact Sheet for Healthcare Providers: SeriousBroker.it  This test is not yet approved or cleared by the [place]  FDA and has been authorized for detection and/or diagnosis of SARS-CoV-2 by FDA under an Emergency Use Authorization (EUA). This EUA will remain in effect (meaning this test can be used) for the duration of the COVID-19 declaration under Section 564(b)(1) of the Act, 21 U.S.C. section 360bbb-3(b)(1), unless the authorization is terminated or revoked.  Performed at [facility], [address] [place], [place] [address]   Culture, blood (routine x 2)     Status: None (Preliminary result)   Collection Time: 02/10/24  1:59 AM   Specimen: BLOOD LEFT ARM  Result Value Ref Range Status   Specimen Description BLOOD LEFT ARM  Final   Special Requests   Final    BOTTLES DRAWN AEROBIC ONLY Blood Culture results may not be optimal due to an inadequate volume of blood received in culture bottles   Culture   Final    NO GROWTH 3 DAYS Performed at [facility], [address] [place], [place] [address]    Report Status PENDING  Incomplete  Culture, blood (routine x 2)     Status: None (Preliminary result)   Collection Time: 02/10/24  1:59 AM   Specimen: BLOOD LEFT HAND  Result Value Ref Range Status   Specimen Description BLOOD LEFT HAND  Final   Special Requests   Final    BOTTLES DRAWN AEROBIC ONLY Blood Culture results may not be optimal due to an inadequate volume of blood received in culture bottles   Culture   Final    NO GROWTH 3 DAYS Performed at [facility], [address] [place], [place] [address]    Report Status PENDING  Incomplete  Expectorated Sputum Assessment w Gram Stain, Rflx to Resp Cult     Status: None   Collection Time: 02/11/24  4:44 AM   Specimen: Expectorated Sputum  Result Value Ref Range Status    Specimen Description EXPECTORATED SPUTUM  Final   Special Requests NONE  Final   Sputum evaluation   Final    Sputum specimen not acceptable for testing.  Please recollect.   JANESE MAROLYN HOE RN [phone] 02/11/24 HNM Performed at [facility], [address] [place], [place] [address]    Report Status 02/11/2024 FINAL  Final    Labs: CBC: Recent Labs  Lab 02/09/24 1851 02/10/24 0259 02/11/24 0320 02/12/24 0510 02/13/24 0340  WBC 13.7* 17.7* 20.0* 15.5* 16.0*  HGB 14.4 14.3 11.3* 10.6* 10.4*  HCT 44.6 42.8 34.1* 32.7* 32.1*  MCV 83.2 80.6 82.4 84.3 83.8  PLT 303 352 262 210 211   Basic Metabolic Panel: Recent Labs  Lab 02/10/24 0159 02/10/24 0806 02/11/24 0320 02/12/24 0510 02/13/24 0340  NA 133* 139 139 139 139  K 4.5 3.9 4.3 3.8 4.1  CL 98 100 104 109 110  CO2 16* 19* 22 20* 21*  GLUCOSE 427* 216* 145* 139* 123*  BUN 26* 25* 28* 31* 26*  CREATININE 1.40* 1.49* 1.96* 2.00* 1.65*  CALCIUM  9.2 9.6 8.2* 8.0* 8.0*  MG  --   --  1.6* 2.2  --   PHOS  --   --  3.9  --   --    Liver Function Tests: Recent Labs  Lab 02/09/24 1851 02/10/24 0159 02/11/24 0320  AST 62* 53* 45*  ALT 36 36 25  ALKPHOS 111 111 79  BILITOT 1.1 1.0 0.8  PROT 7.4 7.5 5.7*  ALBUMIN 4.0 3.8 2.9*   CBG: Recent Labs  Lab 02/12/24 1951 02/13/24 0054 02/13/24 0508 02/13/24 0809 02/13/24 1153  GLUCAP 233* 139* 122* 136* 145*    Discharge time spent: greater than 30 minutes.  Signed: Burnard DELENA Cunning, DO [facility] 02/13/2024

## 2024-02-15 LAB — CULTURE, BLOOD (ROUTINE X 2)
Culture: NO GROWTH
Culture: NO GROWTH

## 2024-04-07 ENCOUNTER — Ambulatory Visit: Admitting: Podiatry
